# Patient Record
Sex: Male | Born: 1960
Health system: Southern US, Community
[De-identification: ages and names within clinical notes are randomized; demographics above are authoritative.]

## PROBLEM LIST (undated history)

## (undated) ENCOUNTER — Emergency Department

## (undated) ENCOUNTER — Ambulatory Visit: Admission: EM

## (undated) DIAGNOSIS — F419 Anxiety disorder, unspecified: Secondary | ICD-10-CM

## (undated) DIAGNOSIS — G709 Myoneural disorder, unspecified: Secondary | ICD-10-CM

## (undated) DIAGNOSIS — T7840XA Allergy, unspecified, initial encounter: Secondary | ICD-10-CM

## (undated) DIAGNOSIS — I10 Essential (primary) hypertension: Secondary | ICD-10-CM

## (undated) DIAGNOSIS — M069 Rheumatoid arthritis, unspecified: Secondary | ICD-10-CM

## (undated) DIAGNOSIS — F32A Depression, unspecified: Secondary | ICD-10-CM

## (undated) HISTORY — PX: TONSILLECTOMY: SUR1361

## (undated) HISTORY — DX: Anxiety disorder, unspecified: F41.9

## (undated) HISTORY — DX: Depression, unspecified: F32.A

## (undated) HISTORY — PX: ROTATOR CUFF REPAIR: SHX139

## (undated) HISTORY — DX: Myoneural disorder, unspecified: G70.9

## (undated) HISTORY — PX: COLONOSCOPY: SHX174

## (undated) HISTORY — DX: Allergy, unspecified, initial encounter: T78.40XA

---

## 2006-01-06 ENCOUNTER — Ambulatory Visit (HOSPITAL_COMMUNITY): Admission: RE | Admit: 2006-01-06 | Discharge: 2006-01-07 | Payer: Self-pay | Admitting: Orthopaedic Surgery

## 2008-08-26 HISTORY — PX: CERVICAL SPINE SURGERY: SHX589

## 2018-01-03 ENCOUNTER — Other Ambulatory Visit (INDEPENDENT_AMBULATORY_CARE_PROVIDER_SITE_OTHER): Payer: Self-pay | Admitting: Specialist

## 2018-01-03 MED ORDER — TRAMADOL HCL 50 MG PO TABS: 100.0000 mg | ORAL_TABLET | Freq: Four times a day (QID) | ORAL | 0 refills | Status: DC | PRN

## 2018-01-03 NOTE — Progress Notes (Unsigned)
A patient of Dr.Yates called on Saturday,he is having swelling in his knee and pain with weight bearing. No injury and no gout Hx. He went to [facility] Sat  5/11 and had an injection and xrays. Also started on prednisone and  The injection relief only lasted 1 hour. I tod them they can use an ACE  Wrap and ice. He is intolerant of NSAIDs. Saw Dr. Lorin Mercy for his neck and had surgery 12/2005. I called in some tramadol and he will call for an appt on Monday. They have some crutches they can use and are  Aware that they may need to go to [place] to get the xrays on a CD for [place] to view

## 2018-01-30 DIAGNOSIS — M25552 Pain in left hip: Secondary | ICD-10-CM | POA: Diagnosis not present

## 2018-01-30 DIAGNOSIS — R509 Fever, unspecified: Secondary | ICD-10-CM | POA: Diagnosis not present

## 2018-01-30 DIAGNOSIS — M25511 Pain in right shoulder: Secondary | ICD-10-CM | POA: Diagnosis not present

## 2018-02-18 ENCOUNTER — Telehealth (INDEPENDENT_AMBULATORY_CARE_PROVIDER_SITE_OTHER): Payer: Self-pay | Admitting: Specialist

## 2018-02-18 NOTE — Telephone Encounter (Signed)
Patient would like to be added to cancellation list if anything opens before 7/11. Patients # [phone]

## 2018-02-19 NOTE — Telephone Encounter (Signed)
I put him on the cancellation list 

## 2018-02-21 ENCOUNTER — Ambulatory Visit (HOSPITAL_COMMUNITY)
Admission: EM | Admit: 2018-02-21 | Discharge: 2018-02-21 | Disposition: A | Discharge status: Home / Self Care | Payer: BLUE CROSS/BLUE SHIELD | Attending: Family Medicine | Admitting: Family Medicine

## 2018-02-21 ENCOUNTER — Other Ambulatory Visit: Payer: Self-pay

## 2018-02-21 ENCOUNTER — Encounter (HOSPITAL_COMMUNITY): Payer: Self-pay | Admitting: *Deleted

## 2018-02-21 DIAGNOSIS — M25511 Pain in right shoulder: Secondary | ICD-10-CM

## 2018-02-21 DIAGNOSIS — M25542 Pain in joints of left hand: Secondary | ICD-10-CM

## 2018-02-21 DIAGNOSIS — M25541 Pain in joints of right hand: Secondary | ICD-10-CM | POA: Diagnosis not present

## 2018-02-21 DIAGNOSIS — M25462 Effusion, left knee: Secondary | ICD-10-CM

## 2018-02-21 HISTORY — DX: Essential (primary) hypertension: I10

## 2018-02-21 MED ORDER — METHYLPREDNISOLONE 4 MG PO TABS: 4.0000 mg | ORAL_TABLET | Freq: Two times a day (BID) | ORAL | 1 refills | Status: DC

## 2018-02-21 NOTE — ED Provider Notes (Signed)
[facility]   [id number] 02/21/18 Arrival Time: 1428   SUBJECTIVE:  Benjamin Fields is a 57 y.o. male who presents to the urgent care with complaint of right shoulder pain and left knee pain no injury.  Patient's initial symptoms began May 1 with right shoulder stiffness and soreness.  He subsequently developed left knee effusion and was seen by orthopedics.  He was given a intra-articular cortisone shot to the left knee.  He immediately had severe pain which lasted 2 days.  He was also given prednisone at that time and eventually experienced some mild relief.  Since that time, patient has seen his primary care doctor who prescribed diclofenac.  Patient points out that NSAIDs cause severe gastric distress for him.  He did not take the diclofenac.  Patient has developed swelling and stiffness in his right pinky and left ring finger over the last month.  He no longer can lift his right arm over shoulder.  Patient denies eye redness, sore throat, rash.  He has had a low-grade fever of 99-1/2 most evenings.  Patient has seen his primary care doctor and tried to get another appointment because of his ongoing pain but was unable to get this done.  Patient has wife would like a variety of rheumatological test done here and are looking for another therapeutic approach to his problem.   Past Medical History:  Diagnosis Date  . Hypertension    Family History  Problem Relation Age of Onset  . Heart failure Father   . Hypertension Father    Social History   Socioeconomic History  . Marital status: Single    Spouse name: Not on file  . Number of children: Not on file  . Years of education: Not on file  . Highest education level: Not on file  Occupational History  . Not on file  Social Needs  . Financial resource strain: Not on file  . Food insecurity:    Worry: Not on file    Inability: Not on file  . Transportation needs:    Medical: Not on file    Non-medical: Not on  file  Tobacco Use  . Smoking status: Never Smoker  . Smokeless tobacco: Never Used  Substance and Sexual Activity  . Alcohol use: Never    Frequency: Never  . Drug use: Never  . Sexual activity: Not on file  Lifestyle  . Physical activity:    Days per week: Not on file    Minutes per session: Not on file  . Stress: Not on file  Relationships  . Social connections:    Talks on phone: Not on file    Gets together: Not on file    Attends religious service: Not on file    Active member of club or organization: Not on file    Attends meetings of clubs or organizations: Not on file    Relationship status: Not on file  . Intimate partner violence:    Fear of current or ex partner: Not on file    Emotionally abused: Not on file    Physically abused: Not on file    Forced sexual activity: Not on file  Other Topics Concern  . Not on file  Social History Narrative  . Not on file   Current Meds  Medication Sig  . traMADol (ULTRAM) 50 MG tablet Take 2 tablets (100 mg total) by mouth every 6 (six) hours as needed for moderate pain.   Not on File  ROS: As per HPI, remainder of ROS negative.   OBJECTIVE:   Vitals:   02/21/18 1536  BP: 119/69  Pulse: (!) 102  Resp: 16  Temp: 98.6 F (37 C)  TempSrc: Oral  SpO2: 100%     General appearance: alert; no distress Eyes: PERRL; EOMI; conjunctiva normal HENT: normocephalic; atraumatic; TMs normal, canal normal, external ears normal without trauma; nasal mucosa normal; oral mucosa normal Neck: supple Lungs: clear to auscultation bilaterally Heart: regular rate and rhythm Abdomen: soft, non-tender; bowel sounds normal; no masses or organomegaly; no guarding or rebound tenderness Back: no CVA tenderness Extremities: no cyanosis or edema; symmetrical with no gross deformities; Left knee: Moderate effusion with no localized tenderness or erythema Right shoulder: Unable to abduct greater than 30 degrees, no localized pain Right  pinky shows PIP joint swelling which is mild and incomplete flexion Left ring finger shows PIP joint swelling and incomplete flexion. Skin: warm and dry Neurologic: normal gait; grossly normal Psychological: alert and cooperative; normal mood and affect      Labs:  No results found for this or any previous visit.  Labs Reviewed - No data to display  No results found.     ASSESSMENT & PLAN:  1. Pain in joint of right shoulder   2. Effusion of left knee   3. Bilateral finger arthralgia   It is unclear to me what the patient expects [place] to do here in urgent care center with an ongoing rheumatological problem.  I explained that they need to see a rheumatologist and get the referral done through the primary care doctor.  Patient does have a appointment in July with an orthopedic doctor.  I have indicated that they need to see a rheumatologist and I have indicated in the epic chart that they could see Dr. Estanislado Pandy, since she is affiliated with the orthopedic practice from which he received a knee injection.  Meds ordered this encounter  Medications  . methylPREDNISolone (MEDROL) 4 MG tablet    Sig: Take 1 tablet (4 mg total) by mouth 2 (two) times daily.    Dispense:  10 tablet    Refill:  1    Reviewed expectations re: course of current medical issues. Questions answered. Outlined signs and symptoms indicating need for more acute intervention. Patient verbalized understanding. After Visit Summary given.       Robyn Haber, MD 02/21/18 1651

## 2018-02-21 NOTE — ED Triage Notes (Signed)
C/o right shoulder pain and left knee pain no injury

## 2018-02-21 NOTE — Discharge Instructions (Signed)
Clearly have an arthritis problem that needs to be better characterized.  You need to call the rheumatologist on Monday and schedule an appointment.

## 2018-03-05 ENCOUNTER — Encounter (INDEPENDENT_AMBULATORY_CARE_PROVIDER_SITE_OTHER): Payer: Self-pay | Admitting: Specialist

## 2018-03-05 ENCOUNTER — Ambulatory Visit (INDEPENDENT_AMBULATORY_CARE_PROVIDER_SITE_OTHER): Payer: BLUE CROSS/BLUE SHIELD | Admitting: Specialist

## 2018-03-05 ENCOUNTER — Ambulatory Visit (INDEPENDENT_AMBULATORY_CARE_PROVIDER_SITE_OTHER): Payer: BLUE CROSS/BLUE SHIELD

## 2018-03-05 VITALS — BP 128/79 | HR 92 | Temp 98.3°F | Ht 70.0 in | Wt 180.0 lb

## 2018-03-05 DIAGNOSIS — M25511 Pain in right shoulder: Secondary | ICD-10-CM

## 2018-03-05 DIAGNOSIS — M25552 Pain in left hip: Secondary | ICD-10-CM | POA: Diagnosis not present

## 2018-03-05 DIAGNOSIS — M25562 Pain in left knee: Secondary | ICD-10-CM | POA: Diagnosis not present

## 2018-03-05 MED ORDER — METHYLPREDNISOLONE 4 MG PO TABS: ORAL_TABLET | ORAL | 1 refills | Status: DC

## 2018-03-05 MED ORDER — TRAMADOL HCL 50 MG PO TABS: 100.0000 mg | ORAL_TABLET | Freq: Four times a day (QID) | ORAL | 0 refills | Status: AC | PRN

## 2018-03-05 NOTE — Patient Instructions (Addendum)
Plan: Avoid overhead lifting and overhead use of the arms. Do not lift greater than 10 lbs. Tylenol ES one every 6-8 hours for pain and inflamation. Call if you are having right knee pain and need to consider a cortisone injection into the right knee.  Home exercise program. ROM to prevent stiffness.  Knee is suffering from osteoarthritis, only real proven treatments are Weight loss, arthritis medications and exercise. Well padded shoes help. Ice the knee 2-3 times a day 15-20 mins at a time. MRI of the right shoulder and left knee ordered to assess soft tissue density about the right humerus and left knee presistent pain and locking. Use a cane in the left hand.

## 2018-03-05 NOTE — Progress Notes (Addendum)
Office Visit Note   Patient: Benjamin Fields           Date of Birth: 07/26/1961           MRN: [id number] Visit Date: 03/05/2018              Requested by: No referring provider defined for this encounter. PCP: Patient, No Pcp Per   Assessment & Plan: Visit Diagnoses:  1. Acute pain of right shoulder   2. Acute pain of left knee   3. Pain in left hip     Plan: Avoid overhead lifting and overhead use of the arms. Do not lift greater than 10 lbs. Tylenol ES one every 6-8 hours for pain and inflamation. Call if you are having right knee pain and need to consider a cortisone injection into the right knee.  Home exercise program.  Knee is suffering from osteoarthritis, only real proven treatments are Weight loss, arthritis medications and exercise. Well padded shoes help. Ice the knee 2-3 times a day 15-20 mins at a time. MRI of the right shoulder and left knee ordered to assess soft tissue density about the right humerus and left knee presistent pain and locking.   Follow-Up Instructions: Return in about 1 week (around 03/12/2018).   Orders:  Orders Placed This Encounter  Procedures  . XR Shoulder Right   No orders of the defined types were placed in this encounter.     Procedures: No procedures performed   Clinical Data: No additional findings.   Subjective: Chief Complaint  Patient presents with  . Right Shoulder - Pain  . Left Hip - Pain    57 year old male with history of previous 3 level fusion of the neck, he has been experiencing pain into the right knee and right shoulder. Notices a Decrease in the affects of an injection done at the [facility] 01/03/2018. He was given a medrol dose pak and he had excellent response to the dose pak with nearly relief of the pain in the knee and the right shoulder. At the day the medication was discontinued the pain recurred. He hurts, has difficulty working or sleeping, notes some fever at night and night sweats.  Reports pain in the right shoulder with stiffness. No history of  Renal disease, no heart of lung disease. 18 years ago he had a pneumonia, was hospitalized for several days.    Review of Systems   Objective: Vital Signs: BP 128/79   Pulse 92   Temp 98.3 F (36.8 C)   Ht 5\' 10"  (1.778 m)   Wt 180 lb (81.6 kg)   BMI 25.83 kg/m   Physical Exam  Constitutional: He is oriented to person, place, and time. He appears well-developed and well-nourished.  HENT:  Head: Normocephalic and atraumatic.  Eyes: Pupils are equal, round, and reactive to light. EOM are normal.  Neck: Normal range of motion. Neck supple.  Pulmonary/Chest: Effort normal and breath sounds normal.  Abdominal: Soft. Bowel sounds are normal.  Neurological: He is alert and oriented to person, place, and time.  Skin: Skin is warm and dry.  Psychiatric: He has a normal mood and affect. His behavior is normal. Judgment and thought content normal.    Right Knee Exam  Right knee exam is normal.  Muscle Strength  The patient has normal right knee strength.   Left Knee Exam   Tenderness  The patient is experiencing tenderness in the patella and patellar tendon.  Range  of Motion  Extension:  -25 abnormal  Flexion: 120   Tests  McMurray:  Medial - negative Lateral - negative Varus: negative  Lachman:  Anterior - negative    Posterior - negative Drawer:  Anterior - negative     Posterior - negative   Left Hip Exam   Range of Motion  Flexion:  80 abnormal  External rotation: 30  Internal rotation: 20    Back Exam   Tenderness  The patient is experiencing tenderness in the lumbar.  Muscle Strength  Right Quadriceps:  5/5  Left Quadriceps:  5/5  Right Hamstrings:  5/5  Left Hamstrings:  5/5   Tests  Straight leg raise left: positive  Reflexes  Patellar:  2/4 normal Achilles:  2/4 normal Babinski's sign: normal   Other  Toe walk: normal Heel walk: normal Sensation: normal Gait: normal    Erythema: no back redness Scars: absent   Right Shoulder Exam   Tenderness  The patient is experiencing tenderness in the acromion.  Range of Motion  Active abduction:  120 abnormal  Passive abduction: 150  Extension: 40  External rotation: 80  Forward flexion: 150  Internal rotation 0 degrees:  T8 abnormal  Internal rotation 90 degrees: 60   Muscle Strength  Abduction: 4/5  Internal rotation: 5/5  External rotation: 4/5  Supraspinatus: 4/5  Subscapularis: 5/5  Biceps: 5/5   Tests  Apprehension: positive Hawkins test: negative Cross arm: negative Impingement: positive Drop arm: positive Sulcus: absent  Other  Erythema: absent Sensation: normal Pulse: present  Comments:  Painful generalized ROM right shoulder    Left Shoulder Exam  Left shoulder exam is normal.      Specialty Comments:  No specialty comments available.  Imaging: No results found.   PMFS History: There are no active problems to display for this patient.  Past Medical History:  Diagnosis Date  . Hypertension     Family History  Problem Relation Age of Onset  . Heart failure Father   . Hypertension Father     Past Surgical History:  Procedure Laterality Date  . CERVICAL SPINE SURGERY     Social History   Occupational History  . Not on file  Tobacco Use  . Smoking status: Never Smoker  . Smokeless tobacco: Never Used  Substance and Sexual Activity  . Alcohol use: Never    Frequency: Never  . Drug use: Never  . Sexual activity: Not on file

## 2018-03-09 ENCOUNTER — Telehealth (INDEPENDENT_AMBULATORY_CARE_PROVIDER_SITE_OTHER): Payer: Self-pay | Admitting: Specialist

## 2018-03-09 LAB — RHEUMATOID FACTOR: Rhuematoid fact SerPl-aCnc: <14 IU/mL (ref <14)

## 2018-03-09 LAB — COMPREHENSIVE METABOLIC PANEL
AG RATIO: 1.4 (calc) (ref 1.0–2.5)
ALBUMIN MSPROF: 3.7 g/dL (ref 3.6–5.1)
ALKALINE PHOSPHATASE (APISO): 90 U/L (ref 40–115)
ALT: 15 U/L (ref 9–46)
AST: 12 U/L (ref 10–35)
BUN: 19 mg/dL (ref 7–25)
CHLORIDE: 102 mmol/L (ref 98–110)
CO2: 26 mmol/L (ref 20–32)
Calcium: 9.1 mg/dL (ref 8.6–10.3)
Creat: 1.16 mg/dL (ref 0.70–1.33)
GLOBULIN: 2.6 g/dL (ref 1.9–3.7)
Glucose, Bld: 95 mg/dL (ref 65–99)
POTASSIUM: 4.7 mmol/L (ref 3.5–5.3)
SODIUM: 138 mmol/L (ref 135–146)
TOTAL PROTEIN: 6.3 g/dL (ref 6.1–8.1)
Total Bilirubin: 0.5 mg/dL (ref 0.2–1.2)

## 2018-03-09 LAB — ANA: Anti Nuclear Antibody(ANA): NEGATIVE

## 2018-03-09 LAB — C-REACTIVE PROTEIN: CRP: 96.4 mg/L — ABNORMAL HIGH (ref <8.0)

## 2018-03-09 LAB — TIQ-NTM

## 2018-03-09 LAB — SEDIMENTATION RATE: SED RATE: 53 mm/h — AB (ref 0–20)

## 2018-03-09 NOTE — Telephone Encounter (Signed)
Patients wife called and is requesting a temporary handicap placard due to patients knee pain/hacing to walk with a cane. Also, she is wondering about the results of blood work done on Thursday. Please call # [phone]

## 2018-03-10 ENCOUNTER — Encounter (INDEPENDENT_AMBULATORY_CARE_PROVIDER_SITE_OTHER): Payer: Self-pay | Admitting: Specialist

## 2018-03-10 ENCOUNTER — Telehealth (INDEPENDENT_AMBULATORY_CARE_PROVIDER_SITE_OTHER): Payer: Self-pay | Admitting: Radiology

## 2018-03-10 NOTE — Telephone Encounter (Signed)
Sent message via My Chart to advise him this is ready for pick up.

## 2018-03-10 NOTE — Telephone Encounter (Addendum)
Patient is calling wanting to know the results of his blood work.

## 2018-03-11 ENCOUNTER — Encounter (INDEPENDENT_AMBULATORY_CARE_PROVIDER_SITE_OTHER): Payer: Self-pay | Admitting: Specialist

## 2018-03-13 ENCOUNTER — Other Ambulatory Visit (INDEPENDENT_AMBULATORY_CARE_PROVIDER_SITE_OTHER): Payer: Self-pay | Admitting: Specialist

## 2018-03-13 DIAGNOSIS — M79601 Pain in right arm: Secondary | ICD-10-CM

## 2018-03-14 ENCOUNTER — Other Ambulatory Visit: Payer: BLUE CROSS/BLUE SHIELD

## 2018-03-14 ENCOUNTER — Ambulatory Visit
Admission: RE | Admit: 2018-03-14 | Discharge: 2018-03-14 | Disposition: A | Discharge status: Home / Self Care | Payer: BLUE CROSS/BLUE SHIELD | Source: Ambulatory Visit | Attending: Specialist | Admitting: Specialist

## 2018-03-14 ENCOUNTER — Inpatient Hospital Stay: Admission: RE | Admit: 2018-03-14 | Payer: BLUE CROSS/BLUE SHIELD | Source: Ambulatory Visit

## 2018-03-14 DIAGNOSIS — M25562 Pain in left knee: Secondary | ICD-10-CM

## 2018-03-14 DIAGNOSIS — M25411 Effusion, right shoulder: Secondary | ICD-10-CM | POA: Diagnosis not present

## 2018-03-14 DIAGNOSIS — M79601 Pain in right arm: Secondary | ICD-10-CM

## 2018-03-14 DIAGNOSIS — M1712 Unilateral primary osteoarthritis, left knee: Secondary | ICD-10-CM | POA: Diagnosis not present

## 2018-03-15 ENCOUNTER — Encounter (INDEPENDENT_AMBULATORY_CARE_PROVIDER_SITE_OTHER): Payer: Self-pay | Admitting: Specialist

## 2018-03-16 ENCOUNTER — Other Ambulatory Visit (INDEPENDENT_AMBULATORY_CARE_PROVIDER_SITE_OTHER): Payer: Self-pay | Admitting: Specialist

## 2018-03-16 DIAGNOSIS — M6599 Unspecified synovitis and tenosynovitis, multiple sites: Secondary | ICD-10-CM

## 2018-03-16 DIAGNOSIS — M659 Synovitis and tenosynovitis, unspecified: Secondary | ICD-10-CM

## 2018-03-16 DIAGNOSIS — M25562 Pain in left knee: Secondary | ICD-10-CM

## 2018-03-16 DIAGNOSIS — M25512 Pain in left shoulder: Secondary | ICD-10-CM

## 2018-03-16 DIAGNOSIS — M5030 Other cervical disc degeneration, unspecified cervical region: Secondary | ICD-10-CM

## 2018-03-16 HISTORY — DX: Other cervical disc degeneration, unspecified cervical region: M50.30

## 2018-03-16 NOTE — Telephone Encounter (Signed)
Called to [facility] 

## 2018-03-16 NOTE — Progress Notes (Signed)
Office Visit Note  Patient: Benjamin Fields             Date of Birth: [birth year]           MRN: [id number]             PCP: Lennie Odor, PA-C Referring: Jessy Oto, MD Visit Date: 03/17/2018 Occupation: Director of finance  Subjective:  Pain in multiple joints   History of Present Illness: Benjamin Fields is a 57 y.o. male seen in consultation per request of Dr. Louanne Skye.  According to patient his symptoms started in March 2019 with right shoulder joint pain and stiffness.  He states gradually the shoulder joint pain got worse to the point he was having difficulty lifting his arm.  About 2 months later he noticed a bruise on the inner side of his left knee.  He says the bruise last for approximately a week.  When the bruise resolved he developed pain and swelling in his left knee joint and also discomfort in his left hip.  He states he has had tick bites and had Lyme test done which was negative.  He has been running low-grade fever and chills.  He recalls in May on 2019 he went to urgent care at [facility] where he had cortisone injection to the left knee joint and also was given a prednisone taper.  He states he did really well for the next 3 to 4 weeks and was basically asymptomatic after that the symptoms recurred.  On the first week of July he was seen at [facility] urgent care where was he was in a lot of discomfort and was given another prednisone taper for 5 days which helped.  He came to see Dr. Louanne Skye due to ongoing joint pain and discomfort and he started him on Medrol 4 mg a day on March 05, 2018.  He has been also taking tramadol for pain management.  He also had MRI of his right shoulder joint and left knee joint which showed moderate effusion.  He has noticed some swelling in his left knee joint off and on.  There is no history of swelling in any other joints.  Activities of Daily Living:  Patient reports morning stiffness for 24 hours.   Patient Reports nocturnal pain.    Difficulty dressing/grooming: Reports Difficulty climbing stairs: Reports Difficulty getting out of chair: Reports Difficulty using hands for taps, buttons, cutlery, and/or writing: Denies  Review of Systems  Constitutional: Positive for activity change and fatigue. Negative for night sweats.  HENT: Positive for nose dryness. Negative for mouth sores and mouth dryness.   Eyes: Negative for redness and dryness.  Respiratory: Negative for cough, shortness of breath and difficulty breathing.   Cardiovascular: Negative for chest pain, palpitations, hypertension, irregular heartbeat and swelling in legs/feet.  Gastrointestinal: Negative for constipation and diarrhea.  Endocrine: Negative for increased urination.  Genitourinary: Negative for difficulty urinating.  Musculoskeletal: Positive for arthralgias, joint pain, joint swelling and morning stiffness. Negative for myalgias, muscle weakness, muscle tenderness and myalgias.  Skin: Negative for color change, rash, hair loss, nodules/bumps, skin tightness, ulcers and sensitivity to sunlight.  Allergic/Immunologic: Negative for susceptible to infections.  Neurological: Negative for dizziness, fainting, memory loss, night sweats and weakness.  Hematological: Positive for bruising/bleeding tendency. Negative for swollen glands.  Psychiatric/Behavioral: Negative for depressed mood and sleep disturbance. The patient is not nervous/anxious.     PMFS History:  Patient Active Problem List   Diagnosis Date Noted  .  Essential hypertension 03/17/2018  . DDD (degenerative disc disease), cervical 03/16/2018    Past Medical History:  Diagnosis Date  . Hypertension     Family History  Problem Relation Age of Onset  . Parkinson's disease Mother   . Heart failure Father   . Hypertension Father    Past Surgical History:  Procedure Laterality Date  . CERVICAL SPINE SURGERY    . TONSILLECTOMY     Social History   Social History Narrative  . Not  on file    Objective: Vital Signs: BP 113/71 (BP Location: Left Arm, Patient Position: Sitting, Cuff Size: Normal)   Pulse 77   Resp 16   Ht 5' 10"  (1.778 m)   Wt 184 lb (83.5 kg)   BMI 26.40 kg/m    Physical Exam  Constitutional: He is oriented to person, place, and time. He appears well-developed and well-nourished.  HENT:  Head: Normocephalic and atraumatic.  Eyes: Pupils are equal, round, and reactive to light. Conjunctivae and EOM are normal.  Neck: Normal range of motion. Neck supple.  Cardiovascular: Normal rate, regular rhythm and normal heart sounds.  Pulmonary/Chest: Effort normal and breath sounds normal.  Abdominal: Soft. Bowel sounds are normal.  Neurological: He is alert and oriented to person, place, and time.  Skin: Skin is warm and dry. Capillary refill takes less than 2 seconds.  Psychiatric: He has a normal mood and affect. His behavior is normal.  Nursing note and vitals reviewed.    Musculoskeletal Exam: C-spine limited range of motion.  He has no thoracic kyphosis.  Lumbar spine good range of motion.  Forward flexion was limited.  No SI joint tenderness was noted.  Right shoulder joint had painful range of motion.  He had large effusion in his right shoulder.  Left shoulder joint bilateral elbow joints and wrist joints are in good range of motion.  He has synovitis in some of the PIPs as described below.  He had painful range of motion of his left hip joint.  He has warmth and effusion in his left knee joint.  Ankle joints MTPs PIPs were in good range of motion.  There was no evidence of Achilles tendinitis or plantar fasciitis.  CDAI Exam: CDAI Homunculus Exam:   Tenderness:  RUE: glenohumeral Right hand: 5th PIP Left hand: 4th PIP LLE: acetabulofemoral and tibiofemoral  Swelling:  RUE: glenohumeral Right hand: 5th PIP Left hand: 4th PIP LLE: tibiofemoral  Joint Counts:  CDAI Tender Joint count: 4 CDAI Swollen Joint count: 4  Global Assessments:    Patient Global Assessment: 8 Provider Global Assessment: 8  CDAI Calculated Score: 24   Investigation: Findings:       Imaging: Mr Humerus Right Wo Contrast  Result Date: 03/15/2018 CLINICAL DATA:  Right shoulder pain. Increased pain when lifting arm for 3-4 months. EXAM: MRI OF THE RIGHT HUMERUS WITHOUT CONTRAST TECHNIQUE: Multiplanar, multisequence MR imaging of the shoulder was performed. No intravenous contrast was administered. COMPARISON:  Radiograph 03/05/2018 FINDINGS: Rotator cuff: Mild infraspinatus and subscapularis tendinopathy/tendinosis. Moderate supraspinatus tendinopathy with small interstitial tears. There is also a shallow articular surface tear anteriorly measuring 6 x 6 mm. No full-thickness retracted tear. Muscles:  Normal Biceps long head: Intact. Mild tendinopathy involving the intra-articular portion. Acromioclavicular Joint: Mild degenerative changes type 1 acromion. Mild lateral downsloping but no undersurface spurring. Glenohumeral Joint: Mild degenerative changes and small to moderate-sized joint effusion. There is also moderate synovitis and moderate rotator interval synovitis. Labrum:  No definite labral tears.  Bones:  No acute bony findings. Other: Large amount of fluid in the subacromial/subdeltoid bursa and also in the subcoracoid bursa and surrounding the biceps tendon. Numerous septations are noted along with synovitis. IMPRESSION: 1. Moderate supraspinatus tendinopathy/tendinosis with small interstitial tears and a shallow articular surface tear. No full-thickness retracted tear. 2. Glenohumeral joint effusion and synovitis. There is also a large amount of fluid in the subacromial/subdeltoid bursa and subcoracoid bursa with septations and possible synovitis. Patient also had a large knee joint effusion and possibility of an inflammatory arthritis is possible. 3. No significant findings for bony impingement. 4. Intact long head biceps tendon and glenoid labrum.  Tendinopathy involving the intra-articular portion of the biceps tendon. Electronically Signed   By: Marijo Sanes M.D.   On: 03/15/2018 11:27   Mr Knee Left W/o Contrast  Result Date: 03/15/2018 CLINICAL DATA:  Knee pain and swelling. EXAM: MRI OF THE LEFT KNEE WITHOUT CONTRAST TECHNIQUE: Multiplanar, multisequence MR imaging of the knee was performed. No intravenous contrast was administered. COMPARISON:  None. FINDINGS: MENISCI Medial meniscus: Intrasubstance degenerative type signal changes involving the posterior horn but no discrete tear. Lateral meniscus:  Intact LIGAMENTS Cruciates:  Intact Collaterals:  Intact CARTILAGE Patellofemoral: Mild degenerative chondrosis. There is also slight lateral tilt of the patella and a very short steep medial facet. Narrowing of the lateral patellofemoral joint. Medial:  Mild degenerative chondrosis. Lateral:  Mild degenerative chondrosis. Joint: Large joint effusion and mild synovitis. Superior and medial patellar plica noted. Popliteal Fossa:  Small leaking Baker's cyst. Extensor Mechanism: The patella retinacular structures are intact and the quadriceps and patellar tendons are intact. Mild lateral tilt of the patella. The TT-TG distance is slightly widened at 21 mm. There is also edema like signal abnormality in the upper lateral aspect of Hoffa's fat which may suggest lateral patellar compression syndrome. Bones: No acute bony findings. No bone contusion, marrow edema or osteochondral lesion. Other: Nonspecific edema like signal abnormality in the vastus medialis and lateralis muscles. This could represent muscle strain or overuse syndrome. Nonspecific myositis could be related to collagen vascular disease, polymyositis or possible drug reaction (statins). The hamstring muscles appear normal. IMPRESSION: 1. Patellofemoral joint degenerative changes and findings suggest lateral patellar compression syndrome. Slight lateral tilt of the patella and widened TT-TG  distance. 2. Intact ligamentous structures and no acute bony findings. 3. Degenerative changes involving the posterior horn of the medial meniscus but no discrete tear. 4. Large joint effusion and mild synovitis. Small leaking Baker's cyst. 5. Nonspecific edema like signal abnormality in the vastus medialis and lateralis muscles as discussed above. Electronically Signed   By: Marijo Sanes M.D.   On: 03/15/2018 11:18   Xr Shoulder Right  Result Date: 03/13/2018 Right shoulder radiographs AP, axillary lateral and outlet view demonstrate no significant G-H joint narrowing, soft tissue density consistent with mass effect right mid and upper posterior humerus, minimal sclerosis greater tuberosity. AC joint with mild enlargement of the distal clavicle but joint line is not narrowed. Subacromial space is well Maintained. Findings of soft tissue swelling in the right upper posterior humerus are concerning for soft tissue mass, inflamatory granulomatous Vs malignancy, findings discussed with the radiologist on call for [facility] Radiology Dr. Keturah Barre.    Recent Labs: Lab Results  Component Value Date   NA 138 03/05/2018   K 4.7 03/05/2018   CL 102 03/05/2018   CO2 26 03/05/2018   GLUCOSE 95 03/05/2018   BUN 19 03/05/2018   CREATININE 1.16 03/05/2018  BILITOT 0.5 03/05/2018   AST 12 03/05/2018   ALT 15 03/05/2018   PROT 6.3 03/05/2018   CALCIUM 9.1 03/05/2018  03/05/2018 ESR 53, CRP 96.4, RF negative ,anti-CCP negative  Speciality Comments: No specialty comments available.  Procedures:  Large Joint Inj: L knee on 03/17/2018 11:26 AM Indications: pain Details: 27 G 1.5 in needle, medial approach  Arthrogram: No  Medications: 3 mL lidocaine (PF) 1 %; 60 mg triamcinolone acetonide 40 MG/ML Aspirate: 23 mL Outcome: tolerated well, no immediate complications Procedure, treatment alternatives, risks and benefits explained, specific risks discussed. Consent was given by the patient. Immediately prior  to procedure a time out was called to verify the correct patient, procedure, equipment, support staff and site/side marked as required. Patient was prepped and draped in the usual sterile fashion.     Allergies: Patient has no allergy information on record.   Assessment / Plan:     Visit Diagnoses: Inflammatory arthritis -patient has inflammatory arthritis in multiple joints involving his right shoulder joint with effusion, inflammatory arthritis in bilateral hands, left hip joint and left knee joint.  His sedimentation rate is elevated.  He has a prescription of Medrol 4 mg p.o. daily by Dr. Louanne Skye which she has been taking for a week now and he will continue for another week.  He states he is some controlling his symptoms quite well with the Medrol and Ultram combination currently.  Polyarthralgia -I will obtain following labs today.  Plan: ANA, Angiotensin converting enzyme, HLA-B27 antigen, B. burgdorfi antibodies, Rocky mtn spotted fvr abs pnl(IgG+IgM), Uric acid,14-3-3 eta Protein, Parvovirus B19 antibody, IgG and IgM  Effusion of right shoulder-he has palpable effusion he also had effusion on the MRI.  I reviewed the x-ray which was unremarkable.  Acute pain of right shoulder - Effusion noted on MRI  Pain in both hands-he has synovitis in his PIPs as described above.  Effusion of left knee -patient has limited extension, positive Baker's cyst and effusion in his knee joint.  After different treatment options were discussed the left knee joint was aspirated for diagnostic and therapeutic purposes.  The synovial fluid was sent for evaluation.  Plan: Synovial cell count + diff, w/ crystals, Anaerobic and Aerobic Culture  Chronic pain of both knees - MRI left knee showed large effusion  DDD (degenerative disc disease), cervical - s/p fusion  High risk medication use - Plan: HIV antibody, QuantiFERON-TB Gold Plus, Serum protein electrophoresis with reflex, IgG, IgA, IgM, Hepatitis B core  antibody, IgM, Hepatitis B surface antigen, Hepatitis C antibody, Glucose 6 phosphate dehydrogenase  Elevated sed rate -   Essential hypertension-his blood pressure is well controlled.  Other fatigue - Plan: CBC with Differential/Platelet, Urinalysis, Routine w reflex microscopic, CK, TSH   Orders: Orders Placed This Encounter  Procedures  . Anaerobic and Aerobic Culture  . CBC with Differential/Platelet  . Urinalysis, Routine w reflex microscopic  . CK  . TSH  . ANA  . Angiotensin converting enzyme  . HLA-B27 antigen  . B. burgdorfi antibodies  . Rocky mtn spotted fvr abs pnl(IgG+IgM)  . HIV antibody  . QuantiFERON-TB Gold Plus  . Serum protein electrophoresis with reflex  . IgG, IgA, IgM  . Hepatitis B core antibody, IgM  . Hepatitis B surface antigen  . Hepatitis C antibody  . Glucose 6 phosphate dehydrogenase  . Uric acid  . 14-3-3 eta Protein  . Parvovirus B19 antibody, IgG and IgM  . Synovial cell count + diff, w/ crystals  No orders of the defined types were placed in this encounter.   Face-to-face time spent with patient was 60 minutes. Greater than 50% of time was spent in counseling and coordination of care.  Follow-Up Instructions: Return for inflammatory arthritis.   Bo Merino, MD  Note - This record has been created using Editor, commissioning.  Chart creation errors have been sought, but may not always  have been located. Such creation errors do not reflect on  the standard of medical care.

## 2018-03-16 NOTE — Telephone Encounter (Signed)
Tramadol refill request 

## 2018-03-17 ENCOUNTER — Encounter (INDEPENDENT_AMBULATORY_CARE_PROVIDER_SITE_OTHER): Payer: Self-pay | Admitting: Specialist

## 2018-03-17 ENCOUNTER — Encounter: Payer: Self-pay | Admitting: Rheumatology

## 2018-03-17 ENCOUNTER — Ambulatory Visit: Payer: BLUE CROSS/BLUE SHIELD | Admitting: Rheumatology

## 2018-03-17 VITALS — BP 113/71 | HR 77 | Resp 16 | Ht 70.0 in | Wt 184.0 lb

## 2018-03-17 DIAGNOSIS — I10 Essential (primary) hypertension: Secondary | ICD-10-CM

## 2018-03-17 DIAGNOSIS — M25511 Pain in right shoulder: Secondary | ICD-10-CM

## 2018-03-17 DIAGNOSIS — M1990 Unspecified osteoarthritis, unspecified site: Secondary | ICD-10-CM | POA: Diagnosis not present

## 2018-03-17 DIAGNOSIS — M25462 Effusion, left knee: Secondary | ICD-10-CM | POA: Diagnosis not present

## 2018-03-17 DIAGNOSIS — M5030 Other cervical disc degeneration, unspecified cervical region: Secondary | ICD-10-CM

## 2018-03-17 DIAGNOSIS — M79641 Pain in right hand: Secondary | ICD-10-CM | POA: Diagnosis not present

## 2018-03-17 DIAGNOSIS — M25562 Pain in left knee: Secondary | ICD-10-CM

## 2018-03-17 DIAGNOSIS — R700 Elevated erythrocyte sedimentation rate: Secondary | ICD-10-CM | POA: Diagnosis not present

## 2018-03-17 DIAGNOSIS — R5383 Other fatigue: Secondary | ICD-10-CM

## 2018-03-17 DIAGNOSIS — M25561 Pain in right knee: Secondary | ICD-10-CM

## 2018-03-17 DIAGNOSIS — Z79899 Other long term (current) drug therapy: Secondary | ICD-10-CM

## 2018-03-17 DIAGNOSIS — M79642 Pain in left hand: Secondary | ICD-10-CM

## 2018-03-17 DIAGNOSIS — M1380 Other specified arthritis, unspecified site: Secondary | ICD-10-CM

## 2018-03-17 DIAGNOSIS — M2550 Pain in unspecified joint: Secondary | ICD-10-CM | POA: Diagnosis not present

## 2018-03-17 DIAGNOSIS — G8929 Other chronic pain: Secondary | ICD-10-CM

## 2018-03-17 DIAGNOSIS — M25411 Effusion, right shoulder: Secondary | ICD-10-CM

## 2018-03-17 MED ORDER — TRIAMCINOLONE ACETONIDE 40 MG/ML IJ SUSP
60.0000 mg | INTRAMUSCULAR | Status: AC | PRN
  Administered 2018-03-17: 60 mg via INTRA_ARTICULAR

## 2018-03-17 MED ORDER — LIDOCAINE HCL (PF) 1 % IJ SOLN
3.0000 mL | INTRAMUSCULAR | Status: AC | PRN
  Administered 2018-03-17: 3 mL

## 2018-03-17 NOTE — Telephone Encounter (Signed)
Called to [facility] 

## 2018-03-17 NOTE — Patient Instructions (Signed)
Please consider getting pneumococcal vaccine and Shingrix vaccine from your PCP.  You cannot have any live vaccines.

## 2018-03-17 NOTE — Telephone Encounter (Signed)
Tramadol refill

## 2018-03-20 ENCOUNTER — Other Ambulatory Visit (INDEPENDENT_AMBULATORY_CARE_PROVIDER_SITE_OTHER): Payer: Self-pay | Admitting: Specialist

## 2018-03-20 LAB — CBC WITH DIFFERENTIAL/PLATELET
BASOS ABS: 93 {cells}/uL (ref 0–200)
BASOS PCT: 0.9 %
EOS ABS: 82 {cells}/uL (ref 15–500)
Eosinophils Relative: 0.8 %
HEMATOCRIT: 43 % (ref 38.5–50.0)
HEMOGLOBIN: 14.9 g/dL (ref 13.2–17.1)
LYMPHS ABS: 1360 {cells}/uL (ref 850–3900)
MCH: 30.2 pg (ref 27.0–33.0)
MCHC: 34.7 g/dL (ref 32.0–36.0)
MCV: 87 fL (ref 80.0–100.0)
MPV: 9.4 fL (ref 7.5–12.5)
Monocytes Relative: 5.4 %
NEUTROS ABS: 8209 {cells}/uL — AB (ref 1500–7800)
Neutrophils Relative %: 79.7 %
Platelets: 359 10*3/uL (ref 140–400)
RBC: 4.94 10*6/uL (ref 4.20–5.80)
RDW: 13 % (ref 11.0–15.0)
Total Lymphocyte: 13.2 %
WBC: 10.3 10*3/uL (ref 3.8–10.8)
WBCMIX: 556 {cells}/uL (ref 200–950)

## 2018-03-20 LAB — IGG, IGA, IGM
IgG (Immunoglobin G), Serum: 548 mg/dL — ABNORMAL LOW (ref 600–1640)
IgM, Serum: 78 mg/dL (ref 50–300)
Immunoglobulin A: 93 mg/dL (ref 47–310)

## 2018-03-20 LAB — QUANTIFERON-TB GOLD PLUS
Mitogen-NIL: 7.36 IU/mL
NIL: 0.03 [IU]/mL
QUANTIFERON-TB GOLD PLUS: NEGATIVE
TB1-NIL: 0 IU/mL
TB2-NIL: 0 IU/mL

## 2018-03-20 LAB — URINALYSIS, ROUTINE W REFLEX MICROSCOPIC
BILIRUBIN URINE: NEGATIVE
GLUCOSE, UA: NEGATIVE
Hgb urine dipstick: NEGATIVE
Ketones, ur: NEGATIVE
Leukocytes, UA: NEGATIVE
Nitrite: NEGATIVE
Protein, ur: NEGATIVE
SPECIFIC GRAVITY, URINE: 1.023 (ref 1.001–1.03)
pH: 7.5 (ref 5.0–8.0)

## 2018-03-20 LAB — PROTEIN ELECTROPHORESIS, SERUM, WITH REFLEX
ALPHA 2: 1 g/dL — AB (ref 0.5–0.9)
Albumin ELP: 3.9 g/dL (ref 3.8–4.8)
Alpha 1: 0.4 g/dL — ABNORMAL HIGH (ref 0.2–0.3)
BETA 2: 0.3 g/dL (ref 0.2–0.5)
BETA GLOBULIN: 0.4 g/dL (ref 0.4–0.6)
Gamma Globulin: 0.5 g/dL — ABNORMAL LOW (ref 0.8–1.7)
TOTAL PROTEIN: 6.6 g/dL (ref 6.1–8.1)

## 2018-03-20 LAB — ROCKY MTN SPOTTED FVR ABS PNL(IGG+IGM)
RMSF IgG: NOT DETECTED
RMSF IgM: NOT DETECTED

## 2018-03-20 LAB — TSH: TSH: 1.03 mIU/L (ref 0.40–4.50)

## 2018-03-20 LAB — 14-3-3 ETA PROTEIN: 14-3-3 eta Protein: <0.2 ng/mL (ref <0.2)

## 2018-03-20 LAB — HLA-B27 ANTIGEN: HLA-B27 ANTIGEN: NEGATIVE

## 2018-03-20 LAB — URIC ACID: Uric Acid, Serum: 6.2 mg/dL (ref 4.0–8.0)

## 2018-03-20 LAB — HEPATITIS C ANTIBODY
Hepatitis C Ab: NONREACTIVE
SIGNAL TO CUT-OFF: 0.01 (ref <1.00)

## 2018-03-20 LAB — GLUCOSE 6 PHOSPHATE DEHYDROGENASE: G-6PDH: 13.9 U/g{Hb} (ref 7.0–20.5)

## 2018-03-20 LAB — ANGIOTENSIN CONVERTING ENZYME: Angiotensin-Converting Enzyme: 40 U/L (ref 9–67)

## 2018-03-20 LAB — ANA: Anti Nuclear Antibody(ANA): NEGATIVE

## 2018-03-20 LAB — IFE INTERPRETATION: IMMUNOFIX ELECTR INT: NOT DETECTED

## 2018-03-20 LAB — B. BURGDORFI ANTIBODIES

## 2018-03-20 LAB — CK: CK TOTAL: 51 U/L (ref 44–196)

## 2018-03-20 LAB — PARVOVIRUS B19 ANTIBODY, IGG AND IGM
PARVOVIRUS B19 IGM: 0.1 (ref <0.9)
Parvovirus B19 IgG: 3.9 — ABNORMAL HIGH (ref <0.9)

## 2018-03-20 LAB — HEPATITIS B CORE ANTIBODY, IGM: HEP B C IGM: NONREACTIVE

## 2018-03-20 LAB — HEPATITIS B SURFACE ANTIGEN: Hepatitis B Surface Ag: NONREACTIVE

## 2018-03-20 LAB — HIV ANTIBODY (ROUTINE TESTING W REFLEX): HIV: NONREACTIVE

## 2018-03-23 LAB — SYNOVIAL CELL COUNT + DIFF, W/ CRYSTALS
BASOPHILS, %: 0 %
Eosinophils-Synovial: 0 % (ref 0–2)
Lymphocytes-Synovial Fld: 5 % (ref 0–74)
Monocyte/Macrophage: 11 % (ref 0–69)
Neutrophil, Synovial: 84 % — ABNORMAL HIGH (ref 0–24)
SYNOVIOCYTES, %: 0 % (ref 0–15)
WBC, Synovial: 26660 cells/uL — ABNORMAL HIGH (ref <150)

## 2018-03-23 LAB — ANAEROBIC AND AEROBIC CULTURE
AER RESULT: NO GROWTH
MICRO NUMBER: 90874787
MICRO NUMBER: 90874804
SPECIMEN QUALITY: ADEQUATE
SPECIMEN QUALITY: ADEQUATE

## 2018-03-25 ENCOUNTER — Encounter: Payer: Self-pay | Admitting: Rheumatology

## 2018-03-26 ENCOUNTER — Encounter: Payer: Self-pay | Admitting: Rheumatology

## 2018-03-26 ENCOUNTER — Ambulatory Visit (INDEPENDENT_AMBULATORY_CARE_PROVIDER_SITE_OTHER): Payer: BLUE CROSS/BLUE SHIELD | Admitting: Specialist

## 2018-03-26 ENCOUNTER — Ambulatory Visit: Payer: BLUE CROSS/BLUE SHIELD | Admitting: Rheumatology

## 2018-03-26 VITALS — BP 124/65 | HR 91 | Resp 16 | Ht 70.0 in | Wt 182.0 lb

## 2018-03-26 DIAGNOSIS — I10 Essential (primary) hypertension: Secondary | ICD-10-CM

## 2018-03-26 DIAGNOSIS — Z79899 Other long term (current) drug therapy: Secondary | ICD-10-CM

## 2018-03-26 DIAGNOSIS — R5383 Other fatigue: Secondary | ICD-10-CM

## 2018-03-26 DIAGNOSIS — M25462 Effusion, left knee: Secondary | ICD-10-CM | POA: Diagnosis not present

## 2018-03-26 DIAGNOSIS — M0609 Rheumatoid arthritis without rheumatoid factor, multiple sites: Secondary | ICD-10-CM | POA: Diagnosis not present

## 2018-03-26 DIAGNOSIS — M25411 Effusion, right shoulder: Secondary | ICD-10-CM | POA: Diagnosis not present

## 2018-03-26 DIAGNOSIS — M5030 Other cervical disc degeneration, unspecified cervical region: Secondary | ICD-10-CM

## 2018-03-26 MED ORDER — METHOTREXATE 2.5 MG PO TABS: ORAL_TABLET | ORAL | 0 refills | Status: DC

## 2018-03-26 MED ORDER — PREDNISONE 5 MG PO TABS: ORAL_TABLET | ORAL | 0 refills | Status: DC

## 2018-03-26 MED ORDER — FOLIC ACID 1 MG PO TABS: 2.0000 mg | ORAL_TABLET | Freq: Every day | ORAL | 2 refills | Status: DC

## 2018-03-26 NOTE — Progress Notes (Signed)
Office Visit Note  Patient: Benjamin Fields             Date of Birth: Feb 19, 1961           MRN: [id number]             PCP: Lennie Odor, PA-C Referring: Lennie Odor, PA-C Visit Date: 03/26/2018 Occupation: @GUAROCC @  Subjective:  Right shoulder pain   History of Present Illness: Benjamin Fields is a 57 y.o. male with history of seronegative rheumatoid arthritis and DDD.  Patient reports he continues to have discomfort in his right shoulder, left knee, and left hip.  He has has mild swelling in the left knee and right shoulder.  He states the left hip pain is most severe at night.  He states he has very little morning stiffness.  He feels a lot better after finishing the prednisone yesterday. He noticed significant benefit following the left knee aspiration and cortisone injection at his initial visit.  She continues to take tramadol for pain relief.     Activities of Daily Living:  Patient reports morning stiffness for  2 minutes.   Patient Reports nocturnal pain.  Difficulty dressing/grooming: Denies Difficulty climbing stairs: Denies Difficulty getting out of chair: Denies Difficulty using hands for taps, buttons, cutlery, and/or writing: Denies  Review of Systems  Constitutional: Positive for fatigue. Negative for night sweats.  HENT: Negative for mouth sores, trouble swallowing, trouble swallowing, mouth dryness and nose dryness.   Eyes: Negative for redness, visual disturbance and dryness.  Respiratory: Negative for cough, hemoptysis, shortness of breath and difficulty breathing.   Cardiovascular: Negative for chest pain, palpitations, hypertension, irregular heartbeat and swelling in legs/feet.  Gastrointestinal: Negative for blood in stool, constipation and diarrhea.  Endocrine: Negative for increased urination.  Genitourinary: Negative for painful urination.  Musculoskeletal: Positive for arthralgias, joint pain, joint swelling, myalgias and myalgias. Negative for muscle  weakness, morning stiffness and muscle tenderness.  Skin: Negative for color change, rash, hair loss, nodules/bumps, skin tightness, ulcers and sensitivity to sunlight.  Allergic/Immunologic: Negative for susceptible to infections.  Neurological: Negative for dizziness, fainting, memory loss, night sweats and weakness.  Hematological: Positive for swollen glands.  Psychiatric/Behavioral: Negative for depressed mood and sleep disturbance. The patient is not nervous/anxious.     PMFS History:  Patient Active Problem List   Diagnosis Date Noted  . Essential hypertension 03/17/2018  . DDD (degenerative disc disease), cervical 03/16/2018    Past Medical History:  Diagnosis Date  . Hypertension     Family History  Problem Relation Age of Onset  . Parkinson's disease Mother   . Heart failure Father   . Hypertension Father    Past Surgical History:  Procedure Laterality Date  . CERVICAL SPINE SURGERY    . TONSILLECTOMY     Social History   Social History Narrative  . Not on file    Objective: Vital Signs: BP 124/65 (BP Location: Left Arm, Patient Position: Sitting, Cuff Size: Normal)   Pulse 91   Resp 16   Ht 5' 10"  (1.778 m)   Wt 182 lb (82.6 kg)   BMI 26.11 kg/m    Physical Exam  Constitutional: He is oriented to person, place, and time. He appears well-developed and well-nourished.  HENT:  Head: Normocephalic and atraumatic.  Eyes: Pupils are equal, round, and reactive to light. Conjunctivae and EOM are normal.  Neck: Normal range of motion. Neck supple.  Cardiovascular: Normal rate, regular rhythm and normal heart sounds.  Pulmonary/Chest: Effort normal and breath sounds normal.  Abdominal: Soft. Bowel sounds are normal.  Lymphadenopathy:    He has no cervical adenopathy.  Neurological: He is alert and oriented to person, place, and time.  Skin: Skin is warm and dry. Capillary refill takes less than 2 seconds.  Psychiatric: He has a normal mood and affect. His  behavior is normal.  Nursing note and vitals reviewed.    Musculoskeletal Exam: C-spine limited ROM. Thoracic kyphosis.  Lumbar spine good ROM.  No midline spinal tenderness.  No SI joint tenderness.  Right shoulder limited abduction to 70 degrees with discomfort.  Effusion of right shoulder noted.  Left shoulder full ROM with no discomfort or warmth or effusion.  Elbow joints, wrist joints, MCPs, PIPs, and DIPs good ROM.  Left 4th PIP joint synovitis and tenderness.  Painful ROM of left hip.  Right hip full ROM with no discomfort.  He has mild swelling of the left knee joint.  Full ROM of bilateral ankle joints. No achilles tendonitis or plantar fasciitis.    CDAI Exam: CDAI Homunculus Exam:   Tenderness:  Left hand: 4th PIP  Swelling:  Left hand: 4th PIP  Joint Counts:  CDAI Tender Joint count: 1 CDAI Swollen Joint count: 1  Global Assessments:  Patient Global Assessment: 2 Provider Global Assessment: 2  CDAI Calculated Score: 6   Investigation: Findings:   03/05/2018 ESR 53, CRP 96.4, RF negative ,anti-CCP negative      Imaging: Mr Humerus Right Wo Contrast  Result Date: 03/15/2018 CLINICAL DATA:  Right shoulder pain. Increased pain when lifting arm for 3-4 months. EXAM: MRI OF THE RIGHT HUMERUS WITHOUT CONTRAST TECHNIQUE: Multiplanar, multisequence MR imaging of the shoulder was performed. No intravenous contrast was administered. COMPARISON:  Radiograph 03/05/2018 FINDINGS: Rotator cuff: Mild infraspinatus and subscapularis tendinopathy/tendinosis. Moderate supraspinatus tendinopathy with small interstitial tears. There is also a shallow articular surface tear anteriorly measuring 6 x 6 mm. No full-thickness retracted tear. Muscles:  Normal Biceps long head: Intact. Mild tendinopathy involving the intra-articular portion. Acromioclavicular Joint: Mild degenerative changes type 1 acromion. Mild lateral downsloping but no undersurface spurring. Glenohumeral Joint: Mild  degenerative changes and small to moderate-sized joint effusion. There is also moderate synovitis and moderate rotator interval synovitis. Labrum:  No definite labral tears. Bones:  No acute bony findings. Other: Large amount of fluid in the subacromial/subdeltoid bursa and also in the subcoracoid bursa and surrounding the biceps tendon. Numerous septations are noted along with synovitis. IMPRESSION: 1. Moderate supraspinatus tendinopathy/tendinosis with small interstitial tears and a shallow articular surface tear. No full-thickness retracted tear. 2. Glenohumeral joint effusion and synovitis. There is also a large amount of fluid in the subacromial/subdeltoid bursa and subcoracoid bursa with septations and possible synovitis. Patient also had a large knee joint effusion and possibility of an inflammatory arthritis is possible. 3. No significant findings for bony impingement. 4. Intact long head biceps tendon and glenoid labrum. Tendinopathy involving the intra-articular portion of the biceps tendon. Electronically Signed   By: Marijo Sanes M.D.   On: 03/15/2018 11:27   Mr Knee Left W/o Contrast  Result Date: 03/15/2018 CLINICAL DATA:  Knee pain and swelling. EXAM: MRI OF THE LEFT KNEE WITHOUT CONTRAST TECHNIQUE: Multiplanar, multisequence MR imaging of the knee was performed. No intravenous contrast was administered. COMPARISON:  None. FINDINGS: MENISCI Medial meniscus: Intrasubstance degenerative type signal changes involving the posterior horn but no discrete tear. Lateral meniscus:  Intact LIGAMENTS Cruciates:  Intact Collaterals:  Intact CARTILAGE Patellofemoral:  Mild degenerative chondrosis. There is also slight lateral tilt of the patella and a very short steep medial facet. Narrowing of the lateral patellofemoral joint. Medial:  Mild degenerative chondrosis. Lateral:  Mild degenerative chondrosis. Joint: Large joint effusion and mild synovitis. Superior and medial patellar plica noted. Popliteal Fossa:   Small leaking Baker's cyst. Extensor Mechanism: The patella retinacular structures are intact and the quadriceps and patellar tendons are intact. Mild lateral tilt of the patella. The TT-TG distance is slightly widened at 21 mm. There is also edema like signal abnormality in the upper lateral aspect of Hoffa's fat which may suggest lateral patellar compression syndrome. Bones: No acute bony findings. No bone contusion, marrow edema or osteochondral lesion. Other: Nonspecific edema like signal abnormality in the vastus medialis and lateralis muscles. This could represent muscle strain or overuse syndrome. Nonspecific myositis could be related to collagen vascular disease, polymyositis or possible drug reaction (statins). The hamstring muscles appear normal. IMPRESSION: 1. Patellofemoral joint degenerative changes and findings suggest lateral patellar compression syndrome. Slight lateral tilt of the patella and widened TT-TG distance. 2. Intact ligamentous structures and no acute bony findings. 3. Degenerative changes involving the posterior horn of the medial meniscus but no discrete tear. 4. Large joint effusion and mild synovitis. Small leaking Baker's cyst. 5. Nonspecific edema like signal abnormality in the vastus medialis and lateralis muscles as discussed above. Electronically Signed   By: Marijo Sanes M.D.   On: 03/15/2018 11:18   Xr Shoulder Right  Result Date: 03/13/2018 Right shoulder radiographs AP, axillary lateral and outlet view demonstrate no significant G-H joint narrowing, soft tissue density consistent with mass effect right mid and upper posterior humerus, minimal sclerosis greater tuberosity. AC joint with mild enlargement of the distal clavicle but joint line is not narrowed. Subacromial space is well Maintained. Findings of soft tissue swelling in the right upper posterior humerus are concerning for soft tissue mass, inflamatory granulomatous Vs malignancy, findings discussed with the  radiologist on call for [facility] Radiology Dr. D.    Recent Labs: Lab Results  Component Value Date   WBC 10.3 03/17/2018   HGB 14.9 03/17/2018   PLT 359 03/17/2018   NA 138 03/05/2018   K 4.7 03/05/2018   CL 102 03/05/2018   CO2 26 03/05/2018   GLUCOSE 95 03/05/2018   BUN 19 03/05/2018   CREATININE 1.16 03/05/2018   BILITOT 0.5 03/05/2018   AST 12 03/05/2018   ALT 15 03/05/2018   PROT 6.6 03/17/2018   CALCIUM 9.1 03/05/2018   QFTBGOLDPLUS NEGATIVE 03/17/2018  March 17, 2018 UA negative, TB Gold negative, IgG level, TSH normal, CK normal, HIV negative, hepatitis B-, hepatitis C negative, G6PD normal, IFE negative Uric acid 6.2, ACE 40, ANA negative, parvo negative, RMSF negative, 14 3 3  eta negative 03/17/2018 synovial fluid WBC 26,660 84% neutrophils, crystals negative, culture negative  Speciality Comments: No specialty comments available.  Procedures:  No procedures performed Allergies: Cortisone   Assessment / Plan:     Visit Diagnoses: Rheumatoid arthritis of multiple sites with negative rheumatoid factor (HCC) - RF-, CCP-, 14-3-3 eta negative.  Patient has severe inflammatory polyarthritis which responded quite well to prednisone.  He had minimal synovitis on examination today.  He just finished prednisone taper.  We had detailed discussion regarding rheumatoid arthritis.  Different treatment options and their side effects were discussed at length.  After reviewing indications side effects contraindications we decided to proceed with methotrexate.  Handout was given and consent was taken.  The plan is to start him on methotrexate 6 tablets p.o. weekly for 2 weeks and if labs are stable we will increase it to 8 tablets p.o. weekly.  He will take folic acid 2 mg p.o. daily.  I will also give him another prednisone taper starting at 10 mg p.o. daily for 1 week and then taper by 2.5 mg every week.  He has been advised to get pneumococcal vaccine and Shingrix vaccine.  He will also  get flu vaccine when needed.  Drug Counseling TB Gold: Negative Hepatitis panel: Negative  Chest-xray: Normal in 2007  Contraception: Not indicated  Alcohol use: Discussed  Patient was counseled on the purpose, proper use, and adverse effects of methotrexate including nausea, infection, and signs and symptoms of pneumonitis.  Reviewed instructions with patient to take methotrexate weekly along with folic acid daily.  Discussed the importance of frequent monitoring of kidney and liver function and blood counts, and provided patient with standing lab instructions.  Counseled patient to avoid NSAIDs and alcohol while on methotrexate.  Provided patient with educational materials on methotrexate and answered all questions.  Advised patient to get annual influenza vaccine and to get a pneumococcal vaccine if patient has not already had one.  Patient voiced understanding.  Patient consented to methotrexate use.  Will upload into chart.    High risk medication use-patient will be starting on methotrexate and prednisone taper.  Effusion of right shoulder-resolved on prednisone although he still have some discomfort with range of motion of his shoulder.  MRI findings of the shoulder joint were also discussed with patient and his wife at length per their request.  Effusion of left knee-he had minimal discomfort with left knee joint range of motion currently.  Essential hypertension  Other fatigue  DDD (degenerative disc disease), cervical - S/p fusion   Orders: Orders Placed This Encounter  Procedures  . CBC with Differential/Platelet  . COMPLETE METABOLIC PANEL WITH GFR   Meds ordered this encounter  Medications  . methotrexate (RHEUMATREX) 2.5 MG tablet    Sig: Take 6 tabs by mouth once weekly for 2 weeks, if labs are stable increase to 8 tabs once weekly. Caution:Chemotherapy. Protect from light.    Dispense:  28 tablet    Refill:  0  . folic acid (FOLVITE) 1 MG tablet    Sig: Take 2  tablets (2 mg total) by mouth daily.    Dispense:  180 tablet    Refill:  2  . predniSONE (DELTASONE) 5 MG tablet    Sig: Take 2 tablets by mouth x7 days, take 1.5 tablets by mouth x7 days, take 1 tablet by mouth x7 days, take 1/2 tablet by mouth x7 days.    Dispense:  35 tablet    Refill:  0    Face-to-face time spent with patient was 50 minutes. Greater than 50% of time was spent in counseling and coordination of care.  Follow-Up Instructions: Return in about 8 weeks (around 05/21/2018) for Rheumatoid arthritis, DDD.   Bo Merino, MD  Note - This record has been created using Editor, commissioning.  Chart creation errors have been sought, but may not always  have been located. Such creation errors do not reflect on  the standard of medical care.

## 2018-03-26 NOTE — Patient Instructions (Addendum)
Standing Labs We placed an order today for your standing lab work.    Please come back and get your standing labs in 2 weeks x2, then 2 months, then every 3 months  We have open lab Monday through Friday from 8:30-11:30 AM and 1:30-4:00 PM  at the office of Dr. Bo Merino.   You may experience shorter wait times on Monday and Friday afternoons. The office is located at [address] [place], [place], [place] [address] No appointment is necessary.   Labs are drawn by [facility].  You may receive a bill from [facility] for your lab work. If you have any questions regarding directions or hours of operation,  please call [phone].      Methotrexate tablets What is this medicine? METHOTREXATE (METH oh TREX ate) is a chemotherapy drug used to treat cancer including breast cancer, leukemia, and lymphoma. This medicine can also be used to treat psoriasis and certain kinds of arthritis. This medicine may be used for other purposes; ask your health care provider or pharmacist if you have questions. COMMON BRAND NAME(S): Rheumatrex, Trexall What should I tell my health care provider before I take this medicine? They need to know if you have any of these conditions: -fluid in the stomach area or lungs -if you often drink alcohol -infection or immune system problems -kidney disease or on hemodialysis -liver disease -low blood counts, like low white cell, platelet, or red cell counts -lung disease -radiation therapy -stomach ulcers -ulcerative colitis -an unusual or allergic reaction to methotrexate, other medicines, foods, dyes, or preservatives -pregnant or trying to get pregnant -breast-feeding How should I use this medicine? Take this medicine by mouth with a glass of water. Follow the directions on the prescription label. Take your medicine at regular intervals. Do not take it more often than directed. Do not stop taking except on your doctor's advice. Make sure you know why you  are taking this medicine and how often you should take it. If this medicine is used for a condition that is not cancer, like arthritis or psoriasis, it should be taken weekly, NOT daily. Taking this medicine more often than directed can cause serious side effects, even death. Talk to your healthcare provider about safe handling and disposal of this medicine. You may need to take special precautions. Talk to your pediatrician regarding the use of this medicine in children. While this drug may be prescribed for selected conditions, precautions do apply. Overdosage: If you think you have taken too much of this medicine contact a poison control center or emergency room at once. NOTE: This medicine is only for you. Do not share this medicine with others. What if I miss a dose? If you miss a dose, talk with your doctor or health care professional. Do not take double or extra doses. What may interact with this medicine? This medicine may interact with the following medication: -acitretin -aspirin and aspirin-like medicines including salicylates -azathioprine -certain antibiotics like penicillins, tetracycline, and chloramphenicol -cyclosporine -gold -hydroxychloroquine -live virus vaccines -NSAIDs, medicines for pain and inflammation, like ibuprofen or naproxen -other cytotoxic agents -penicillamine -phenylbutazone -phenytoin -probenecid -retinoids such as isotretinoin and tretinoin -steroid medicines like prednisone or cortisone -sulfonamides like sulfasalazine and trimethoprim/sulfamethoxazole -theophylline This list may not describe all possible interactions. Give your health care provider a list of all the medicines, herbs, non-prescription drugs, or dietary supplements you use. Also tell them if you smoke, drink alcohol, or use illegal drugs. Some items may interact with your medicine. What  should I watch for while using this medicine? Avoid alcoholic drinks. This medicine can make you  more sensitive to the sun. Keep out of the sun. If you cannot avoid being in the sun, wear protective clothing and use sunscreen. Do not use sun lamps or tanning beds/booths. You may need blood work done while you are taking this medicine. Call your doctor or health care professional for advice if you get a fever, chills or sore throat, or other symptoms of a cold or flu. Do not treat yourself. This drug decreases your body's ability to fight infections. Try to avoid being around people who are sick. This medicine may increase your risk to bruise or bleed. Call your doctor or health care professional if you notice any unusual bleeding. Check with your doctor or health care professional if you get an attack of severe diarrhea, nausea and vomiting, or if you sweat a lot. The loss of too much body fluid can make it dangerous for you to take this medicine. Talk to your doctor about your risk of cancer. You may be more at risk for certain types of cancers if you take this medicine. Both men and women must use effective birth control with this medicine. Do not become pregnant while taking this medicine or until at least 1 normal menstrual cycle has occurred after stopping it. Women should inform their doctor if they wish to become pregnant or think they might be pregnant. Men should not father a child while taking this medicine and for 3 months after stopping it. There is a potential for serious side effects to an unborn child. Talk to your health care professional or pharmacist for more information. Do not breast-feed an infant while taking this medicine. What side effects may I notice from receiving this medicine? Side effects that you should report to your doctor or health care professional as soon as possible: -allergic reactions like skin rash, itching or hives, swelling of the face, lips, or tongue -breathing problems or shortness of breath -diarrhea -dry, nonproductive cough -low blood counts - this  medicine may decrease the number of white blood cells, red blood cells and platelets. You may be at increased risk for infections and bleeding. -mouth sores -redness, blistering, peeling or loosening of the skin, including inside the mouth -signs of infection - fever or chills, cough, sore throat, pain or trouble passing urine -signs and symptoms of bleeding such as bloody or black, tarry stools; red or dark-brown urine; spitting up blood or brown material that looks like coffee grounds; red spots on the skin; unusual bruising or bleeding from the eye, gums, or nose -signs and symptoms of kidney injury like trouble passing urine or change in the amount of urine -signs and symptoms of liver injury like dark yellow or brown urine; general ill feeling or flu-like symptoms; light-colored stools; loss of appetite; nausea; right upper belly pain; unusually weak or tired; yellowing of the eyes or skin Side effects that usually do not require medical attention (report to your doctor or health care professional if they continue or are bothersome): -dizziness -hair loss -tiredness -upset stomach -vomiting This list may not describe all possible side effects. Call your doctor for medical advice about side effects. You may report side effects to FDA at 1-800-FDA-1088. Where should I keep my medicine? Keep out of the reach of children. Store at room temperature between 20 and 25 degrees C (68 and 77 degrees F). Protect from light. Throw away any unused medicine after  the expiration date. NOTE: This sheet is a summary. It may not cover all possible information. If you have questions about this medicine, talk to your doctor, pharmacist, or health care provider.  2018 Elsevier/Gold Standard (2015-04-17 05:39:22)  Please get pneumococcal vaccine and Shingrix vaccine.

## 2018-04-03 ENCOUNTER — Encounter: Payer: Self-pay | Admitting: Rheumatology

## 2018-04-06 ENCOUNTER — Other Ambulatory Visit: Payer: Self-pay

## 2018-04-06 DIAGNOSIS — Z79899 Other long term (current) drug therapy: Secondary | ICD-10-CM | POA: Diagnosis not present

## 2018-04-07 LAB — CBC WITH DIFFERENTIAL/PLATELET
BASOS ABS: 39 {cells}/uL (ref 0–200)
Basophils Relative: 0.5 %
Eosinophils Absolute: 139 cells/uL (ref 15–500)
Eosinophils Relative: 1.8 %
HEMATOCRIT: 40.7 % (ref 38.5–50.0)
Hemoglobin: 13.9 g/dL (ref 13.2–17.1)
LYMPHS ABS: 1971 {cells}/uL (ref 850–3900)
MCH: 30.9 pg (ref 27.0–33.0)
MCHC: 34.2 g/dL (ref 32.0–36.0)
MCV: 90.4 fL (ref 80.0–100.0)
MPV: 9.9 fL (ref 7.5–12.5)
Monocytes Relative: 6.6 %
NEUTROS PCT: 65.5 %
Neutro Abs: 5044 cells/uL (ref 1500–7800)
Platelets: 194 10*3/uL (ref 140–400)
RBC: 4.5 10*6/uL (ref 4.20–5.80)
RDW: 14.6 % (ref 11.0–15.0)
Total Lymphocyte: 25.6 %
WBC: 7.7 10*3/uL (ref 3.8–10.8)
WBCMIX: 508 {cells}/uL (ref 200–950)

## 2018-04-07 LAB — COMPLETE METABOLIC PANEL WITH GFR
AG Ratio: 2.1 (calc) (ref 1.0–2.5)
ALKALINE PHOSPHATASE (APISO): 67 U/L (ref 40–115)
ALT: 29 U/L (ref 9–46)
AST: 13 U/L (ref 10–35)
Albumin: 4.1 g/dL (ref 3.6–5.1)
BILIRUBIN TOTAL: 0.7 mg/dL (ref 0.2–1.2)
BUN: 16 mg/dL (ref 7–25)
CHLORIDE: 105 mmol/L (ref 98–110)
CO2: 26 mmol/L (ref 20–32)
Calcium: 9.4 mg/dL (ref 8.6–10.3)
Creat: 1.1 mg/dL (ref 0.70–1.33)
GFR, Est African American: 85 mL/min/{1.73_m2} (ref >=60)
GFR, Est Non African American: 73 mL/min/{1.73_m2} (ref >=60)
GLUCOSE: 105 mg/dL — AB (ref 65–99)
Globulin: 2 g/dL (calc) (ref 1.9–3.7)
Potassium: 4.4 mmol/L (ref 3.5–5.3)
Sodium: 139 mmol/L (ref 135–146)
Total Protein: 6.1 g/dL (ref 6.1–8.1)

## 2018-04-15 ENCOUNTER — Encounter: Payer: Self-pay | Admitting: Rheumatology

## 2018-04-15 ENCOUNTER — Ambulatory Visit: Payer: BLUE CROSS/BLUE SHIELD | Admitting: Rheumatology

## 2018-04-16 NOTE — Telephone Encounter (Signed)
Attempted to contact the patient and left message for patient to call the office.  

## 2018-04-17 NOTE — Progress Notes (Deleted)
   Office Visit Note  Patient: Benjamin Fields             Date of Birth: 12-15-1960           MRN: [id number]             PCP: Lennie Odor, PA-C Referring: Lennie Odor, PA-C Visit Date: 05/01/2018 Occupation: @GUAROCC @  Subjective:  No chief complaint on file.   History of Present Illness: Benjamin Fields is a 57 y.o. male ***   Activities of Daily Living:  Patient reports morning stiffness for *** {minute/hour:19697}.   Patient {ACTIONS;DENIES/REPORTS:21021675::"Denies"} nocturnal pain.  Difficulty dressing/grooming: {ACTIONS;DENIES/REPORTS:21021675::"Denies"} Difficulty climbing stairs: {ACTIONS;DENIES/REPORTS:21021675::"Denies"} Difficulty getting out of chair: {ACTIONS;DENIES/REPORTS:21021675::"Denies"} Difficulty using hands for taps, buttons, cutlery, and/or writing: {ACTIONS;DENIES/REPORTS:21021675::"Denies"}  No Rheumatology ROS completed.   PMFS History:  Patient Active Problem List   Diagnosis Date Noted  . Essential hypertension 03/17/2018  . DDD (degenerative disc disease), cervical 03/16/2018    Past Medical History:  Diagnosis Date  . Hypertension     Family History  Problem Relation Age of Onset  . Parkinson's disease Mother   . Heart failure Father   . Hypertension Father    Past Surgical History:  Procedure Laterality Date  . CERVICAL SPINE SURGERY    . TONSILLECTOMY     Social History   Social History Narrative  . Not on file    Objective: Vital Signs: There were no vitals taken for this visit.   Physical Exam   Musculoskeletal Exam: ***  CDAI Exam: CDAI Score: Not documented Patient Global Assessment: Not documented; Provider Global Assessment: Not documented Swollen: Not documented; Tender: Not documented Joint Exam   Not documented   There is currently no information documented on the homunculus. Go to the Rheumatology activity and complete the homunculus joint exam.  Investigation: No additional findings.  Imaging: No  results found.  Recent Labs: Lab Results  Component Value Date   WBC 7.7 04/06/2018   HGB 13.9 04/06/2018   PLT 194 04/06/2018   NA 139 04/06/2018   K 4.4 04/06/2018   CL 105 04/06/2018   CO2 26 04/06/2018   GLUCOSE 105 (H) 04/06/2018   BUN 16 04/06/2018   CREATININE 1.10 04/06/2018   BILITOT 0.7 04/06/2018   AST 13 04/06/2018   ALT 29 04/06/2018   PROT 6.1 04/06/2018   CALCIUM 9.4 04/06/2018   GFRAA 85 04/06/2018   QFTBGOLDPLUS NEGATIVE 03/17/2018    Speciality Comments: No specialty comments available.  Procedures:  No procedures performed Allergies: Cortisone   Assessment / Plan:     Visit Diagnoses: No diagnosis found.   Orders: No orders of the defined types were placed in this encounter.  No orders of the defined types were placed in this encounter.   Face-to-face time spent with patient was *** minutes. Greater than 50% of time was spent in counseling and coordination of care.  Follow-Up Instructions: No follow-ups on file.   Earnestine Mealing, CMA  Note - This record has been created using Editor, commissioning.  Chart creation errors have been sought, but may not always  have been located. Such creation errors do not reflect on  the standard of medical care.

## 2018-04-20 ENCOUNTER — Encounter: Payer: Self-pay | Admitting: Rheumatology

## 2018-04-20 ENCOUNTER — Other Ambulatory Visit: Payer: Self-pay

## 2018-04-20 DIAGNOSIS — Z79899 Other long term (current) drug therapy: Secondary | ICD-10-CM | POA: Diagnosis not present

## 2018-04-20 NOTE — Telephone Encounter (Signed)
Per Lovena Le Dale-PA-C scheduled patient to be seen on 04/21/18 at 2 pm.

## 2018-04-20 NOTE — Telephone Encounter (Signed)
Patient states he has started to experience itching, red rash and raised bumps on the front and back of my torso. Patient states he has been on the MTX for 3 weeks. Patient states he had dry skin type itching prior to taking the MTX. Patient states the itching has gotten worse, patient states the rash started over the weekend. Patient states his last dose of MTX was on 04/18/18. Patient states this was his second time taking 8 tablets. Please advise.

## 2018-04-21 ENCOUNTER — Encounter: Payer: Self-pay | Admitting: Rheumatology

## 2018-04-21 ENCOUNTER — Telehealth: Payer: Self-pay | Admitting: Pharmacy Technician

## 2018-04-21 ENCOUNTER — Other Ambulatory Visit: Payer: Self-pay | Admitting: Pharmacist

## 2018-04-21 ENCOUNTER — Ambulatory Visit: Payer: BLUE CROSS/BLUE SHIELD | Admitting: Rheumatology

## 2018-04-21 VITALS — BP 131/79 | HR 80 | Resp 14 | Ht 70.0 in | Wt 187.0 lb

## 2018-04-21 DIAGNOSIS — M25462 Effusion, left knee: Secondary | ICD-10-CM

## 2018-04-21 DIAGNOSIS — M5030 Other cervical disc degeneration, unspecified cervical region: Secondary | ICD-10-CM

## 2018-04-21 DIAGNOSIS — M0609 Rheumatoid arthritis without rheumatoid factor, multiple sites: Secondary | ICD-10-CM | POA: Diagnosis not present

## 2018-04-21 DIAGNOSIS — M25411 Effusion, right shoulder: Secondary | ICD-10-CM

## 2018-04-21 DIAGNOSIS — R5383 Other fatigue: Secondary | ICD-10-CM

## 2018-04-21 DIAGNOSIS — Z79899 Other long term (current) drug therapy: Secondary | ICD-10-CM

## 2018-04-21 DIAGNOSIS — B360 Pityriasis versicolor: Secondary | ICD-10-CM

## 2018-04-21 DIAGNOSIS — I10 Essential (primary) hypertension: Secondary | ICD-10-CM

## 2018-04-21 LAB — COMPLETE METABOLIC PANEL WITH GFR
AG Ratio: 2.1 (calc) (ref 1.0–2.5)
ALT: 25 U/L (ref 9–46)
AST: 15 U/L (ref 10–35)
Albumin: 4.4 g/dL (ref 3.6–5.1)
Alkaline phosphatase (APISO): 76 U/L (ref 40–115)
BUN: 16 mg/dL (ref 7–25)
CALCIUM: 10 mg/dL (ref 8.6–10.3)
CO2: 30 mmol/L (ref 20–32)
CREATININE: 1.28 mg/dL (ref 0.70–1.33)
Chloride: 106 mmol/L (ref 98–110)
GFR, EST NON AFRICAN AMERICAN: 61 mL/min/{1.73_m2} (ref >=60)
GFR, Est African American: 71 mL/min/{1.73_m2} (ref >=60)
Globulin: 2.1 g/dL (calc) (ref 1.9–3.7)
Glucose, Bld: 96 mg/dL (ref 65–99)
POTASSIUM: 4.4 mmol/L (ref 3.5–5.3)
Sodium: 143 mmol/L (ref 135–146)
Total Bilirubin: 0.7 mg/dL (ref 0.2–1.2)
Total Protein: 6.5 g/dL (ref 6.1–8.1)

## 2018-04-21 LAB — CBC WITH DIFFERENTIAL/PLATELET
BASOS ABS: 88 {cells}/uL (ref 0–200)
Basophils Relative: 1.2 %
Eosinophils Absolute: 299 cells/uL (ref 15–500)
Eosinophils Relative: 4.1 %
HCT: 42.3 % (ref 38.5–50.0)
HEMOGLOBIN: 14.4 g/dL (ref 13.2–17.1)
Lymphs Abs: 2248 cells/uL (ref 850–3900)
MCH: 30.8 pg (ref 27.0–33.0)
MCHC: 34 g/dL (ref 32.0–36.0)
MCV: 90.4 fL (ref 80.0–100.0)
MPV: 9.7 fL (ref 7.5–12.5)
Monocytes Relative: 7.4 %
Neutro Abs: 4125 cells/uL (ref 1500–7800)
Neutrophils Relative %: 56.5 %
PLATELETS: 261 10*3/uL (ref 140–400)
RBC: 4.68 10*6/uL (ref 4.20–5.80)
RDW: 15.3 % — ABNORMAL HIGH (ref 11.0–15.0)
TOTAL LYMPHOCYTE: 30.8 %
WBC mixed population: 540 cells/uL (ref 200–950)
WBC: 7.3 10*3/uL (ref 3.8–10.8)

## 2018-04-21 MED ORDER — SELENIUM SULFIDE 2.3 % EX SHAM: 1.0000 "application " | MEDICATED_SHAMPOO | Freq: Every day | CUTANEOUS | 0 refills | Status: AC

## 2018-04-21 MED ORDER — FLUCONAZOLE 150 MG PO TABS: ORAL_TABLET | ORAL | 0 refills | Status: DC

## 2018-04-21 MED ORDER — METHOTREXATE (PF) 20 MG/0.4ML ~~LOC~~ SOAJ: 20.0000 mg | SUBCUTANEOUS | 0 refills | Status: DC

## 2018-04-21 MED ORDER — PREDNISONE 5 MG PO TABS: ORAL_TABLET | ORAL | 0 refills | Status: DC

## 2018-04-21 NOTE — Progress Notes (Signed)
[facility] sent fax informing our office that the selenium 2.3% shampoo was not covered under insurance and would cost the patient $500.  Renee test claim through [facility] long outpatient pharmacy for the selenium 2.5% shampoo which went through for a $10 co-pay.  Faxed [facility] with instructions to fill the prescription for selenium 2.5% shampoo.  Will send document to scan center.

## 2018-04-21 NOTE — Patient Instructions (Signed)
Standing Labs We placed an order today for your standing lab work.    Please come back and get your standing labs in 1 month and then every 3 months  We have open lab Monday through Friday from 8:30-11:30 AM and 1:30-4:00 PM  at the office of Dr. Faruq Rosenberger.   You may experience shorter wait times on Monday and Friday afternoons. The office is located at [address], [address], [place], [place] [address] No appointment is necessary.   Labs are drawn by [facility].  You may receive a bill from [facility] for your lab work. If you have any questions regarding directions or hours of operation,  please call [phone].    

## 2018-04-21 NOTE — Telephone Encounter (Signed)
Received a Prior Authorization request from [employer] for Rasuvo 20mg . Authorization has been submitted to patient's insurance via Cover My Meds. Will update once we receive a response.  4:26 PM Beatriz Chancellor, CPhT

## 2018-04-21 NOTE — Progress Notes (Signed)
Office Visit Note  Patient: Benjamin Fields             Date of Birth: 1961/04/25           MRN: [id number]             PCP: Lennie Odor, PA-C Referring: Lennie Odor, PA-C Visit Date: 04/21/2018 Occupation: @GUAROCC @  Subjective:  Pain and swelling in multiple joints..   History of Present Illness: Benjamin Fields is a 57 y.o. male with history of rheumatoid arthritis and DDD.  He has been on methotrexate for 4 weeks now.  He has been on methotrexate 8 tablets p.o. weekly for the last 2 weeks.  He states after his last dose of methotrexate on Saturday on Sunday he developed a rash all over his trunk.  And there was a localized area on his back which was extremely painful.  The symptoms got worse over the next 2 days and then eased off after he used some topical agents.  Since he has decreased the prednisone dose his joint pain symptoms have increased.  He describes increased pain in his left hip joint and also ongoing discomfort in his right shoulder joint.  He again has difficulty raising his right arm.  Activities of Daily Living:  Patient reports morning stiffness for 0 minutes.   Patient Reports nocturnal pain.  Difficulty dressing/grooming: Reports Difficulty climbing stairs: Denies Difficulty getting out of chair: Denies Difficulty using hands for taps, buttons, cutlery, and/or writing: Denies  Review of Systems  Constitutional: Positive for fatigue. Negative for night sweats.  HENT: Positive for mouth dryness. Negative for mouth sores, trouble swallowing, trouble swallowing and nose dryness.   Eyes: Negative for redness, itching and dryness.  Respiratory: Negative for shortness of breath, wheezing and difficulty breathing.   Cardiovascular: Negative for chest pain, palpitations, hypertension, irregular heartbeat and swelling in legs/feet.  Gastrointestinal: Negative for abdominal pain, constipation and diarrhea.  Endocrine: Negative for increased urination.  Genitourinary:  Negative for painful urination.  Musculoskeletal: Positive for arthralgias, joint pain, joint swelling and morning stiffness. Negative for myalgias, muscle weakness, muscle tenderness and myalgias.  Skin: Positive for rash. Negative for color change, hair loss, nodules/bumps, skin tightness, ulcers and sensitivity to sunlight.  Allergic/Immunologic: Negative for susceptible to infections.  Neurological: Negative for dizziness, fainting, light-headedness, headaches, memory loss, night sweats and weakness.  Hematological: Negative for bruising/bleeding tendency and swollen glands.  Psychiatric/Behavioral: Negative for depressed mood, confusion and sleep disturbance. The patient is not nervous/anxious.     PMFS History:  Patient Active Problem List   Diagnosis Date Noted  . Essential hypertension 03/17/2018  . DDD (degenerative disc disease), cervical 03/16/2018    Past Medical History:  Diagnosis Date  . Hypertension     Family History  Problem Relation Age of Onset  . Parkinson's disease Mother   . Heart failure Father   . Hypertension Father    Past Surgical History:  Procedure Laterality Date  . CERVICAL SPINE SURGERY    . TONSILLECTOMY     Social History   Social History Narrative  . Not on file    Objective: Vital Signs: BP 131/79 (BP Location: Left Arm, Patient Position: Sitting, Cuff Size: Normal)   Pulse 80   Resp 14   Ht 5\' 10"  (1.778 m)   Wt 187 lb (84.8 kg)   BMI 26.83 kg/m    Physical Exam  Constitutional: He is oriented to person, place, and time. He appears well-developed and well-nourished.  HENT:  Head: Normocephalic and atraumatic.  Eyes: Pupils are equal, round, and reactive to light. Conjunctivae and EOM are normal.  Neck: Normal range of motion. Neck supple.  Cardiovascular: Normal rate, regular rhythm and normal heart sounds.  Pulmonary/Chest: Effort normal and breath sounds normal.  Abdominal: Soft. Bowel sounds are normal.  Neurological: He  is alert and oriented to person, place, and time.  Skin: Skin is warm and dry. Capillary refill takes less than 2 seconds.  Erythematous hypo-and hyperpigmented lesions covering the trunk consistent with tinea versicolor  Psychiatric: He has a normal mood and affect. His behavior is normal.  Nursing note and vitals reviewed.    Musculoskeletal Exam: C-spine thoracic lumbar spine good range of motion.  He had discomfort painful range of motion of his right shoulder joint with abduction limited to 100 degrees.  No effusion was palpable today.  Elbow joints were in good range of motion.  Some of the PIPs were swollen as marked below.  He had discomfort range of motion of left hip joint.  No warmth swelling or effusion was noted in the knee joints.  CDAI Exam: CDAI Score: Not documented Patient Global Assessment: 7 (mm); Provider Global Assessment: Not documented Swollen: 3 ; Tender: 4  Joint Exam      Right  Left  Glenohumeral  Swollen Tender     PIP 4     Swollen Tender  PIP 5  Swollen Tender     Hip      Tender     Investigation: No additional findings.  Imaging: No results found.  Recent Labs: Lab Results  Component Value Date   WBC 7.3 04/20/2018   HGB 14.4 04/20/2018   PLT 261 04/20/2018   NA 143 04/20/2018   K 4.4 04/20/2018   CL 106 04/20/2018   CO2 30 04/20/2018   GLUCOSE 96 04/20/2018   BUN 16 04/20/2018   CREATININE 1.28 04/20/2018   BILITOT 0.7 04/20/2018   AST 15 04/20/2018   ALT 25 04/20/2018   PROT 6.5 04/20/2018   CALCIUM 10.0 04/20/2018   GFRAA 71 04/20/2018   QFTBGOLDPLUS NEGATIVE 03/17/2018    Speciality Comments: No specialty comments available.  Procedures:  No procedures performed Allergies: Cortisone   Assessment / Plan:     Visit Diagnoses: Rheumatoid arthritis of multiple sites with negative rheumatoid factor (HCC) - RF-, CCP-, 14-3-3 eta negative.  Patient is having a flare of rheumatoid arthritis on the lower dose of prednisone.  He has  been tolerating methotrexate but having difficulty swallowing 8 pills once a week.  He would like to go on subcutaneous methotrexate.  Due to aggressive ligature of his arthritis he will probably need more aggressive therapy in future.  I would like to try subcutaneous methotrexate first.  My plan is to start him on Rasuvo 20 mg subcu weekly.  He will discontinue oral methotrexate.  If he had an adequate response over the next 3 to 4 weeks we may add Enbrel to his therapy.  I will also increase his prednisone to 10 mg p.o. daily for couple of weeks and then decrease by 2.5 mg every 2 weeks.  High risk medication use - MTX 8 tabs po qwk, folic acid 2mg  po qd, prednisone 2.5 mg po qd..  His labs are stable.  Effusion of left knee-resolved after the cortisone injection.  Effusion of right shoulder-he is having increased pain and discomfort in his right shoulder on decreased dose of prednisone.  He had no  effusion on palpation today.  Other fatigue-he has been experiencing increased fatigue since he has been on lower dose of prednisone.  DDD (degenerative disc disease), cervical - S/p fusion.  He has limited range of motion.  Tinea versicolor-he has a rash on his trunk consistent with tinea versicolor.  Patient recalls that he has had similar rash in the past prior to starting methotrexate.  I have advised him to use Diflucan 150 mg 2 tablets p.o. weekly x2.  Side effects were discussed.  We will check his labs again  in a month.  I have been also advised to use selenium sulfide shampoo to wash his body.  Essential hypertension   Orders: No orders of the defined types were placed in this encounter.  Meds ordered this encounter  Medications  . Methotrexate, PF, 20 MG/0.4ML SOAJ    Sig: Inject 20 mg into the skin once a week.    Dispense:  12 pen    Refill:  0  . predniSONE (DELTASONE) 5 MG tablet    Sig: Take 2 tablets by mouth daily x2 weeks, 1.5 tablets x2 weeks, 1 tablet x2 weeks, 1/2 tablet  x2 weeks.    Dispense:  70 tablet    Refill:  0  . fluconazole (DIFLUCAN) 150 MG tablet    Sig: Take 2 tablets by mouth once weekly for 2 weeks.    Dispense:  4 tablet    Refill:  0  . Selenium Sulfide 2.3 % SHAM    Sig: Apply 1 application topically daily for 7 days. Leave on skin for 10 minutes, then rinse.    Dispense:  180 mL    Refill:  0    Face-to-face time spent with patient was 30 minutes. Greater than 50% of time was spent in counseling and coordination of care.  Follow-Up Instructions: Return in about 4 weeks (around 05/19/2018) for Rheumatoid arthritis.   Bo Merino, MD  Note - This record has been created using Editor, commissioning.  Chart creation errors have been sought, but may not always  have been located. Such creation errors do not reflect on  the standard of medical care.

## 2018-04-21 NOTE — Progress Notes (Signed)
Pharmacy Counseling Note  Counseled patient of proper use, storage,  and dosing of injectable methotrexate, Rasuvo.  Patient's regular methotrexate dosing schedule is on Saturday but he will be out of town.  Was given sample in office to use this Saturday along with co-pay card.  Patient was also counseled on appropriate use of other prescription medications Diflucan and selenium shampoo.  All questions thank encouraged and answered.  Patient instructed to call with any questions.  Rasuvo will require a prior authorization in the office we will keep him updated.  Mariella Saa, PharmD, [facility] Rheumatology Clinical Pharmacist  04/21/2018 3:27 PM

## 2018-04-23 ENCOUNTER — Telehealth: Payer: Self-pay | Admitting: Pharmacist

## 2018-04-23 ENCOUNTER — Telehealth: Payer: Self-pay | Admitting: Pharmacy Technician

## 2018-04-23 NOTE — Telephone Encounter (Signed)
Received a fax regarding Prior Authorization for Rasuvo. Authorization has been DENIED because plan requires patient to try and fail Otrexup.  Will send document to scan center.  Phone# [phone]  12:24 PM Beatriz Chancellor, CPhT

## 2018-04-23 NOTE — Telephone Encounter (Signed)
Authorization for Otrexup 20mg  has been submitted to patient's insurance via Cover My Meds. Will update once we receive a response.  Received a notification from [facility] regarding a prior authorization for Benjamin Fields. Authorization has been APPROVED from 04/23/18 to 04/21/21.   Will send document to scan center, once received.  Please send prescription to pharmacy.  Authorization # [id number] Phone # [phone]  12:32 PM Beatriz Chancellor, CPhT

## 2018-04-23 NOTE — Telephone Encounter (Signed)
Called patient and was unavailable to reach due to traveling.  Left voicemail to call back so I can notify him of the change.  Current RX should be able to be filled as Rasuvo or Otrexup.

## 2018-04-23 NOTE — Telephone Encounter (Signed)
Patient returned my call and informed him of insurance approval for Otrexup instead of Rasuvo.  Explained that it was the same product but different device.  He asked if he should still take the sample we gave in office.  Instructed patient to use sample as it is the same product just a different brand name. Encouraged patient to call with any questions about administration.

## 2018-04-28 ENCOUNTER — Telehealth: Payer: Self-pay | Admitting: Pharmacy Technician

## 2018-04-28 NOTE — Telephone Encounter (Signed)
Received a Prior Authorization request from [employer] for Enbrel Mini. Authorization has been submitted to patient's insurance via Cover My Meds. Will update once we receive a response.  8:49 AM Beatriz Chancellor, CPhT

## 2018-04-30 ENCOUNTER — Encounter: Payer: Self-pay | Admitting: Rheumatology

## 2018-04-30 NOTE — Telephone Encounter (Signed)
Enbrel Mini has been approved. Patient has follow up appointment at the end of September.  Sent document to scan center  11:23 AM Beatriz Chancellor, CPhT

## 2018-04-30 NOTE — Telephone Encounter (Signed)
Received notification via Cover My Meds regarding a prior authorization for Enbrel. Authorization has been APPROVED from 04/28/18 to 08/25/2038.   Will send document to scan center once received.  Authorization # [id number] Phone # [phone]

## 2018-05-01 ENCOUNTER — Ambulatory Visit: Payer: BLUE CROSS/BLUE SHIELD | Admitting: Rheumatology

## 2018-05-01 ENCOUNTER — Telehealth: Payer: Self-pay | Admitting: Pharmacy Technician

## 2018-05-01 ENCOUNTER — Telehealth: Payer: Self-pay | Admitting: Rheumatology

## 2018-05-01 MED ORDER — METHOTREXATE (PF) 20 MG/0.4ML ~~LOC~~ SOAJ: 20.0000 mg | SUBCUTANEOUS | 0 refills | Status: DC

## 2018-05-01 NOTE — Telephone Encounter (Signed)
[employer], Spoke to [place], she stated that there was a Emergency planning/management officer in their billing system that let Rasuvo go through. Only PA on file is for Otrexup. Patient has already picked up Rasuvo and has copay card to take to pharmacy to see if they can rebill and refund him. Patient says he only filled 1 month supply. For next refill, please send in with note saying to bill for Otrexup.  Provided patient with copay card info for [facility] for future refills.  BIN- [id number] ID- [id number] Group- [id number]  Ref#[id number] Phone# [phone]  3:10 PM Beatriz Chancellor, CPhT

## 2018-05-01 NOTE — Addendum Note (Signed)
Addended by: Carole Binning on: 05/01/2018 03:36 PM   Modules accepted: Orders

## 2018-05-01 NOTE — Telephone Encounter (Signed)
[facility] does not carry MTX prefill auto injector.

## 2018-05-04 NOTE — Telephone Encounter (Signed)
Called [facility] to verify they do not carry auto-injector MTX per note below. Spoke to April Rph, who said they don't have it in stock, but it can be ordered. Not sure who called office and left that message. They did not receive Otrexup prescription, so Wayne Sever gave verbal for #4- Otrexup 20mg  pens plus 1 refill.  Phone- [phone]  2:33 PM Beatriz Chancellor, CPhT

## 2018-05-07 ENCOUNTER — Encounter: Payer: Self-pay | Admitting: Rheumatology

## 2018-05-07 NOTE — Progress Notes (Signed)
Office Visit Note  Patient: Benjamin Fields             Date of Birth: 22-Jul-1961           MRN: [id number]             PCP: Lennie Odor, PA-C Referring: Lennie Odor, PA-C Visit Date: 05/19/2018 Occupation: @GUAROCC @  Subjective:  Medication management   History of Present Illness: Benjamin Fields is a 57 y.o. male with history of seronegative rheumatoid arthritis and degenerative disc disease.  He was a started on subcu methotrexate along with prednisone taper.  He has reduced prednisone to 5 mg p.o. daily now.  He has been taking methotrexate injections subcutaneously in the abdominal region.  He states last week he tried it in his left thigh.  He noticed some weakness in his right thigh quad muscle for about 3 hours which resolved.  He continues to have some generalized fatigue.  He has had no recurrence of left knee joint effusion.  Overall his joints are feeling better.  His right shoulder joint is better.  He was also given Diflucan last time for tinea versicolor.  The rash is resolved now.  Continues to have discomfort in his right shoulder and difficulty raising his arm.  Activities of Daily Living:  Patient reports morning stiffness for 1 hour.   Patient Denies nocturnal pain.  Difficulty dressing/grooming: Denies Difficulty climbing stairs: Denies Difficulty getting out of chair: Denies Difficulty using hands for taps, buttons, cutlery, and/or writing: Denies  Review of Systems  Constitutional: Positive for fatigue. Negative for night sweats.  HENT: Negative for mouth sores, mouth dryness and nose dryness.   Eyes: Positive for discharge. Negative for redness and dryness.  Respiratory: Negative for shortness of breath and difficulty breathing.   Cardiovascular: Negative for chest pain, palpitations, hypertension, irregular heartbeat and swelling in legs/feet.  Gastrointestinal: Negative for constipation and diarrhea.  Endocrine: Negative for increased urination.    Genitourinary: Negative for difficulty urinating.  Musculoskeletal: Positive for morning stiffness. Negative for arthralgias, joint pain, joint swelling, myalgias, muscle weakness, muscle tenderness and myalgias.  Skin: Negative for color change, rash, hair loss, nodules/bumps, skin tightness, ulcers and sensitivity to sunlight.  Allergic/Immunologic: Negative for susceptible to infections.  Neurological: Negative for dizziness, fainting, memory loss, night sweats and weakness.  Hematological: Negative for bruising/bleeding tendency and swollen glands.  Psychiatric/Behavioral: Negative for depressed mood and sleep disturbance. The patient is not nervous/anxious.     PMFS History:  Patient Active Problem List   Diagnosis Date Noted  . Rheumatoid arthritis of multiple sites with negative rheumatoid factor ([place]) 05/19/2018  . Tinea versicolor 05/19/2018  . Essential hypertension 03/17/2018  . DDD (degenerative disc disease), cervical 03/16/2018    Past Medical History:  Diagnosis Date  . Hypertension   . RA (rheumatoid arthritis) (HCC)     Family History  Problem Relation Age of Onset  . Parkinson's disease Mother   . Heart failure Father   . Hypertension Father    Past Surgical History:  Procedure Laterality Date  . CERVICAL SPINE SURGERY    . TONSILLECTOMY     Social History   Social History Narrative  . Not on file    Objective: Vital Signs: BP 135/67 (BP Location: Left Arm, Patient Position: Sitting, Cuff Size: Normal)   Pulse 86   Resp 14   Ht 5\' 10"  (1.778 m)   Wt 191 lb 6.4 oz (86.8 kg)   BMI 27.46 kg/m  Physical Exam  Constitutional: He is oriented to person, place, and time. He appears well-developed and well-nourished.  HENT:  Head: Normocephalic and atraumatic.  Eyes: Pupils are equal, round, and reactive to light. Conjunctivae and EOM are normal.  Neck: Normal range of motion. Neck supple.  Cardiovascular: Normal rate, regular rhythm and normal heart  sounds.  Pulmonary/Chest: Effort normal and breath sounds normal.  Abdominal: Soft. Bowel sounds are normal.  Neurological: He is alert and oriented to person, place, and time.  Skin: Skin is warm and dry. Capillary refill takes less than 2 seconds.  Psychiatric: He has a normal mood and affect. His behavior is normal.  Nursing note and vitals reviewed.    Musculoskeletal Exam: He has limited range of motion of his cervical spine due to fusion.  He has right shoulder joint abduction limited to 100 degrees which was painful.  The effusion has resolved.  Elbow joints wrist joint MCPs PIPs DIPs been good range of motion with no synovitis.  Hip joints knee joints ankles MTPs PIPs were in good range of motion with no synovitis.  CDAI Exam: CDAI Score: 1.8  Patient Global Assessment: 4 (mm); Provider Global Assessment: 4 (mm) Swollen: 0 ; Tender: 1  Joint Exam      Right  Left  Glenohumeral   Tender        Investigation: No additional findings.  Imaging: No results found.  Recent Labs: Lab Results  Component Value Date   WBC 8.9 05/18/2018   HGB 15.1 05/18/2018   PLT 235 05/18/2018   NA 140 05/18/2018   K 4.5 05/18/2018   CL 104 05/18/2018   CO2 29 05/18/2018   GLUCOSE 96 05/18/2018   BUN 17 05/18/2018   CREATININE 1.11 05/18/2018   BILITOT 0.6 05/18/2018   AST 21 05/18/2018   ALT 36 05/18/2018   PROT 6.3 05/18/2018   CALCIUM 10.2 05/18/2018   GFRAA 84 05/18/2018   QFTBGOLDPLUS NEGATIVE 03/17/2018    Speciality Comments: No specialty comments available.  Procedures:  Large Joint Inj: R glenohumeral on 05/19/2018 2:59 PM Indications: pain Details: 27 G 1.5 in needle, posterior approach  Arthrogram: No  Medications: 1 mL lidocaine 1 %; 40 mg triamcinolone acetonide 40 MG/ML Aspirate: 0 mL Outcome: tolerated well, no immediate complications Procedure, treatment alternatives, risks and benefits explained, specific risks discussed. Consent was given by the patient.  Immediately prior to procedure a time out was called to verify the correct patient, procedure, equipment, support staff and site/side marked as required. Patient was prepped and draped in the usual sterile fashion.     Allergies: Cortisone   Assessment / Plan:     Visit Diagnoses: Rheumatoid arthritis of multiple sites with negative rheumatoid factor (HCC) - RF-, CCP-, 14-3-3 eta negative.  Patient had no synovitis on examination.  He still have pain and discomfort in his right shoulder and limited range of motion.  High risk medication use - MTX 20 mg sq q wk, folic acid 2 mg po qd.  He has been tolerating methotrexate well.  He had an episode of right muscle weakness lasting for 3 hours which resolved.  I do not think that this episode was related to the methotrexate injection.  It was in his other extremity.  Other fatigue-he has chronic history of fatigue.  Effusion of left knee-resolved.  Chronic right shoulder pain-he still has limited range of motion of his right shoulder and discomfort with range of motion.  After informed consent was obtained and  different treatment options were discussed right shoulder joint was injected with cortisone as described above.  He tolerated the procedure well.  Effusion of right shoulder-resolved.  DDD (degenerative disc disease), cervical - S/p fusion.  Tinea versicolor-the rash has resolved with Diflucan.  Essential hypertension -his systolic blood pressure was mildly elevated today.  Have advised him to monitor his blood pressure closely.  Orders: Orders Placed This Encounter  Procedures  . Large Joint Inj   No orders of the defined types were placed in this encounter.   Face-to-face time spent with patient was 30 minutes. Greater than 50% of time was spent in counseling and coordination of care.  Follow-Up Instructions: Return in about 3 months (around 08/18/2018) for Rheumatoid arthritis, DDD.   Bo Merino, MD  Note - This  record has been created using Editor, commissioning.  Chart creation errors have been sought, but may not always  have been located. Such creation errors do not reflect on  the standard of medical care.

## 2018-05-08 ENCOUNTER — Ambulatory Visit (HOSPITAL_COMMUNITY)
Admission: EM | Admit: 2018-05-08 | Discharge: 2018-05-08 | Disposition: A | Discharge status: Home / Self Care | Payer: BLUE CROSS/BLUE SHIELD | Attending: Emergency Medicine | Admitting: Emergency Medicine

## 2018-05-08 ENCOUNTER — Encounter (HOSPITAL_COMMUNITY): Payer: Self-pay | Admitting: Emergency Medicine

## 2018-05-08 DIAGNOSIS — H1013 Acute atopic conjunctivitis, bilateral: Secondary | ICD-10-CM

## 2018-05-08 HISTORY — DX: Rheumatoid arthritis, unspecified: M06.9

## 2018-05-08 MED ORDER — OLOPATADINE HCL 0.1 % OP SOLN: 1.0000 [drp] | Freq: Two times a day (BID) | OPHTHALMIC | 12 refills | Status: AC

## 2018-05-08 NOTE — Discharge Instructions (Addendum)
Your eyes look well today, I do not see any indication of infection.  We will try drops to help with allergic symptoms which I feel will help with your symptoms.  If you develop any worsening of redness, pain, fevers, vision changes, thick drainage or otherwise worsening please return to be seen or go to the Er.  If symptoms persist without improvement please follow up with your eye doctor.

## 2018-05-08 NOTE — ED Provider Notes (Signed)
[facility]    CSN: [id number] Arrival date & time: 05/08/18  1019     History   Chief Complaint Chief Complaint  Patient presents with  . Eye Pain    HPI Benjamin Fields is a 57 y.o. male.   Benjamin Fields presents with complaints of bilateral eyes with burning and itching sensation which started a few days ago. Left eye feels a bit worse than right. States this morning noted slight redness. Has had tearing to the eye without any mattering or exudate. No vision change, no swelling to lids or eyes, no eye ball pain. No injury, no exposure to eyes. Wears glasses, does not wear contacts. Denies any previous similar. Denies cough, congestion, ear pain, sore throat.  He is on methotrexate and prednisone due to RA. Hx of htn.     ROS per HPI.      Past Medical History:  Diagnosis Date  . Hypertension   . RA (rheumatoid arthritis) [facility])     Patient Active Problem List   Diagnosis Date Noted  . Essential hypertension 03/17/2018  . DDD (degenerative disc disease), cervical 03/16/2018    Past Surgical History:  Procedure Laterality Date  . CERVICAL SPINE SURGERY    . TONSILLECTOMY         Home Medications    Prior to Admission medications   Medication Sig Start Date End Date Taking? Authorizing Provider  fluconazole (DIFLUCAN) 150 MG tablet Take 2 tablets by mouth once weekly for 2 weeks. 04/21/18   Bo Merino, MD  folic acid (FOLVITE) 1 MG tablet Take 2 tablets (2 mg total) by mouth daily. 03/26/18   Bo Merino, MD  losartan (COZAAR) 25 MG tablet Take 25 mg by mouth daily. for high blood pressure 03/11/18   [provider]  methotrexate (RHEUMATREX) 2.5 MG tablet Take 6 tabs by mouth once weekly for 2 weeks, if labs are stable increase to 8 tabs once weekly. Caution:Chemotherapy. Protect from light. 03/26/18   Bo Merino, MD  Methotrexate, PF, 20 MG/0.4ML SOAJ Inject 20 mg into the skin once a week. 04/21/18   Bo Merino, MD    Methotrexate, PF, 20 MG/0.4ML SOAJ Inject 20 mg into the skin once a week. 05/01/18   Bo Merino, MD  olopatadine (PATANOL) 0.1 % ophthalmic solution Place 1 drop into both eyes 2 (two) times daily for 14 days. 05/08/18 05/22/18  Zigmund Gottron, NP  predniSONE (DELTASONE) 5 MG tablet Take 2 tablets by mouth daily x2 weeks, 1.5 tablets x2 weeks, 1 tablet x2 weeks, 1/2 tablet x2 weeks. 04/21/18   Bo Merino, MD  traMADol (ULTRAM) 50 MG tablet TAKE 2 TABLETS BY MOUTH EVERY 6 HOURS AS NEEDED FOR  UP  TO  7  DAYS 03/17/18   Jessy Oto, MD    Family History Family History  Problem Relation Age of Onset  . Parkinson's disease Mother   . Heart failure Father   . Hypertension Father     Social History Social History   Tobacco Use  . Smoking status: Never Smoker  . Smokeless tobacco: Never Used  Substance Use Topics  . Alcohol use: Never    Frequency: Never  . Drug use: Never     Allergies   Cortisone   Review of Systems Review of Systems   Physical Exam Triage Vital Signs ED Triage Vitals [05/08/18 1049]  Enc Vitals Group     BP (!) 150/89     Pulse Rate 87  Resp 18     Temp 98.2 F (36.8 C)     Temp Source Oral     SpO2 98 %     Weight      Height      Head Circumference      Peak Flow      Pain Score      Pain Loc      Pain Edu?      Excl. in [place]?    No data found.  Updated Vital Signs BP (!) 150/89 (BP Location: Right Arm)   Pulse 87   Temp 98.2 F (36.8 C) (Oral)   Resp 18   SpO2 98%     Visual Acuity  Right Eye Distance:   Left Eye Distance:   Bilateral Distance:    Right Eye Near: R Near: 20/20 Left Eye Near:  L Near: 20/50 Bilateral Near:      Physical Exam  Constitutional: He is oriented to person, place, and time. He appears well-developed and well-nourished.  HENT:  Right Ear: External ear normal.  Left Ear: External ear normal.  Nose: Nose normal.  Mouth/Throat: Oropharynx is clear and moist.  Eyes: Pupils are  equal, round, and reactive to light. Conjunctivae, EOM and lids are normal. Right eye exhibits no discharge, no exudate and no hordeolum. Left eye exhibits no discharge, no exudate and no hordeolum.  No redness, drainage, eye pain or acute findings on gross exam; fluorescein deferred at this time   Cardiovascular: Normal rate and regular rhythm.  Pulmonary/Chest: Effort normal and breath sounds normal.  Neurological: He is alert and oriented to person, place, and time.  Skin: Skin is warm and dry.     UC Treatments / Results  Labs (all labs ordered are listed, but only abnormal results are displayed) Labs Reviewed - No data to display  EKG None  Radiology No results found.  Procedures Procedures (including critical care time)  Medications Ordered in UC Medications - No data to display  Initial Impression / Assessment and Plan / UC Course  I have reviewed the triage vital signs and the nursing notes.  Pertinent labs & imaging results that were available during my care of the patient were reviewed by me and considered in my medical decision making (see chart for details).     History physical and exam consistent with an allergic conjunctivitis. Benign physical exam. No red flag findings. patanol provided for comfort. Return precautions provided. Patient verbalized understanding and agreeable to plan.    Final Clinical Impressions(s) / UC Diagnoses   Final diagnoses:  Allergic conjunctivitis of both eyes     Discharge Instructions     Your eyes look well today, I do not see any indication of infection.  We will try drops to help with allergic symptoms which I feel will help with your symptoms.  If you develop any worsening of redness, pain, fevers, vision changes, thick drainage or otherwise worsening please return to be seen or go to the Er.  If symptoms persist without improvement please follow up with your eye doctor.    ED Prescriptions    Medication Sig Dispense  Auth. Provider   olopatadine (PATANOL) 0.1 % ophthalmic solution Place 1 drop into both eyes 2 (two) times daily for 14 days. 5 mL Augusto Gamble B, NP     Controlled Substance Prescriptions [place] Controlled Substance Registry consulted? Not Applicable   Zigmund Gottron, NP 05/08/18 1147

## 2018-05-08 NOTE — ED Triage Notes (Signed)
Pt here for left eye irritation x 2 days

## 2018-05-16 ENCOUNTER — Encounter: Payer: Self-pay | Admitting: Rheumatology

## 2018-05-18 ENCOUNTER — Other Ambulatory Visit: Payer: Self-pay

## 2018-05-18 DIAGNOSIS — Z79899 Other long term (current) drug therapy: Secondary | ICD-10-CM | POA: Diagnosis not present

## 2018-05-18 NOTE — Telephone Encounter (Signed)
Attempted to contact patient and left message for patient to call the office.  

## 2018-05-18 NOTE — Telephone Encounter (Signed)
Patient states he took his Otrexup injection in left thigh on Saturday as scheduled. Patient states that he developed weakness in the right leg for several hours on Saturday. He states he was unable to walk for on it.  Patient states that has now subsided. Patient does have a follow up appointment on 05/19/18.

## 2018-05-18 NOTE — Telephone Encounter (Signed)
Findings of benefits investigation via test claims:   Insurance: [employer] [place]  Copay: $200  Patient eligible to sign up for a copay card through [employer].  Phone# [phone]  11:40 AM Beatriz Chancellor, CPhT

## 2018-05-18 NOTE — Telephone Encounter (Signed)
Entry is spoke with patient and he will come in the office tomorrow to be evaluated.

## 2018-05-19 ENCOUNTER — Ambulatory Visit: Payer: BLUE CROSS/BLUE SHIELD | Admitting: Rheumatology

## 2018-05-19 ENCOUNTER — Encounter: Payer: Self-pay | Admitting: Rheumatology

## 2018-05-19 VITALS — BP 135/67 | HR 86 | Resp 14 | Ht 70.0 in | Wt 191.4 lb

## 2018-05-19 DIAGNOSIS — B360 Pityriasis versicolor: Secondary | ICD-10-CM

## 2018-05-19 DIAGNOSIS — M25511 Pain in right shoulder: Secondary | ICD-10-CM

## 2018-05-19 DIAGNOSIS — Z79899 Other long term (current) drug therapy: Secondary | ICD-10-CM | POA: Diagnosis not present

## 2018-05-19 DIAGNOSIS — M5030 Other cervical disc degeneration, unspecified cervical region: Secondary | ICD-10-CM | POA: Diagnosis not present

## 2018-05-19 DIAGNOSIS — G8929 Other chronic pain: Secondary | ICD-10-CM

## 2018-05-19 DIAGNOSIS — I10 Essential (primary) hypertension: Secondary | ICD-10-CM

## 2018-05-19 DIAGNOSIS — R5383 Other fatigue: Secondary | ICD-10-CM | POA: Diagnosis not present

## 2018-05-19 DIAGNOSIS — M0609 Rheumatoid arthritis without rheumatoid factor, multiple sites: Secondary | ICD-10-CM

## 2018-05-19 HISTORY — DX: Pityriasis versicolor: B36.0

## 2018-05-19 LAB — COMPLETE METABOLIC PANEL WITH GFR
AG RATIO: 2 (calc) (ref 1.0–2.5)
ALKALINE PHOSPHATASE (APISO): 57 U/L (ref 40–115)
ALT: 36 U/L (ref 9–46)
AST: 21 U/L (ref 10–35)
Albumin: 4.2 g/dL (ref 3.6–5.1)
BUN: 17 mg/dL (ref 7–25)
CHLORIDE: 104 mmol/L (ref 98–110)
CO2: 29 mmol/L (ref 20–32)
Calcium: 10.2 mg/dL (ref 8.6–10.3)
Creat: 1.11 mg/dL (ref 0.70–1.33)
GFR, Est African American: 84 mL/min/{1.73_m2} (ref >=60)
GFR, Est Non African American: 72 mL/min/{1.73_m2} (ref >=60)
Globulin: 2.1 g/dL (calc) (ref 1.9–3.7)
Glucose, Bld: 96 mg/dL (ref 65–99)
POTASSIUM: 4.5 mmol/L (ref 3.5–5.3)
Sodium: 140 mmol/L (ref 135–146)
Total Bilirubin: 0.6 mg/dL (ref 0.2–1.2)
Total Protein: 6.3 g/dL (ref 6.1–8.1)

## 2018-05-19 LAB — CBC WITH DIFFERENTIAL/PLATELET
BASOS PCT: 0.9 %
Basophils Absolute: 80 cells/uL (ref 0–200)
EOS ABS: 71 {cells}/uL (ref 15–500)
EOS PCT: 0.8 %
HCT: 43.6 % (ref 38.5–50.0)
HEMOGLOBIN: 15.1 g/dL (ref 13.2–17.1)
Lymphs Abs: 1887 cells/uL (ref 850–3900)
MCH: 31.9 pg (ref 27.0–33.0)
MCHC: 34.6 g/dL (ref 32.0–36.0)
MCV: 92 fL (ref 80.0–100.0)
MONOS PCT: 6.1 %
MPV: 10 fL (ref 7.5–12.5)
NEUTROS ABS: 6319 {cells}/uL (ref 1500–7800)
Neutrophils Relative %: 71 %
PLATELETS: 235 10*3/uL (ref 140–400)
RBC: 4.74 10*6/uL (ref 4.20–5.80)
RDW: 16.6 % — ABNORMAL HIGH (ref 11.0–15.0)
TOTAL LYMPHOCYTE: 21.2 %
WBC mixed population: 543 cells/uL (ref 200–950)
WBC: 8.9 10*3/uL (ref 3.8–10.8)

## 2018-05-19 MED ORDER — TRIAMCINOLONE ACETONIDE 40 MG/ML IJ SUSP
40.0000 mg | INTRAMUSCULAR | Status: AC | PRN
  Administered 2018-05-19: 40 mg via INTRA_ARTICULAR

## 2018-05-19 MED ORDER — LIDOCAINE HCL 1 % IJ SOLN
1.0000 mL | INTRAMUSCULAR | Status: AC | PRN
  Administered 2018-05-19: 1 mL

## 2018-05-19 NOTE — Patient Instructions (Addendum)
Standing Labs We placed an order today for your standing lab work.    Please come back and get your standing labs in December and every 3 months.  We have open lab Monday through Friday from 8:30-11:30 AM and 1:30-4:00 PM  at the office of Dr. Bo Merino.   You may experience shorter wait times on Monday and Friday afternoons. The office is located at [address] [place], [place], [place] [address] No appointment is necessary.   Labs are drawn by [facility].  You may receive a bill from [facility] [facility] for your lab work. If you have any questions regarding directions or hours of operation,  please call [phone].    Vaccines You are taking a medication(s) that can suppress your immune system.  The following immunizations are recommended: . Flu annually . Pneumonia . Shingrix . Hepatitis B  Please check with your PCP to make sure you are up to date.

## 2018-05-26 DIAGNOSIS — Z23 Encounter for immunization: Secondary | ICD-10-CM | POA: Diagnosis not present

## 2018-05-26 DIAGNOSIS — I10 Essential (primary) hypertension: Secondary | ICD-10-CM | POA: Diagnosis not present

## 2018-05-26 DIAGNOSIS — R7309 Other abnormal glucose: Secondary | ICD-10-CM | POA: Diagnosis not present

## 2018-05-26 DIAGNOSIS — M069 Rheumatoid arthritis, unspecified: Secondary | ICD-10-CM | POA: Diagnosis not present

## 2018-05-28 ENCOUNTER — Ambulatory Visit: Payer: BLUE CROSS/BLUE SHIELD | Admitting: Physician Assistant

## 2018-06-03 ENCOUNTER — Encounter: Payer: Self-pay | Admitting: Rheumatology

## 2018-06-05 ENCOUNTER — Telehealth: Payer: Self-pay | Admitting: Rheumatology

## 2018-06-05 ENCOUNTER — Encounter: Payer: Self-pay | Admitting: Rheumatology

## 2018-06-05 MED ORDER — DICLOFENAC SODIUM 1 % TD GEL: TRANSDERMAL | 3 refills | Status: DC

## 2018-06-05 NOTE — Telephone Encounter (Signed)
Please offer a prescription for voltaren gel which he can apply topically 3 times daily.  He had a cortisone injection on 03/17/18.  If his pain and swelling persists, he can return for an injection 3 months after the previous injection.  If he noticed increased joint pain and joint swelling in other joints, he may need to return for a sooner office visit to discuss more aggressive therapy.

## 2018-06-05 NOTE — Telephone Encounter (Signed)
Patient left a voicemail stating she was returning your call.   

## 2018-06-05 NOTE — Telephone Encounter (Signed)
Attempted to contact patient and left message for patient to call the office.  

## 2018-06-05 NOTE — Telephone Encounter (Signed)
Per Julieta Gutting, PA-C Please offer a prescription for voltaren gel which he can apply topically 3 times daily. He had a cortisone injection on 03/17/18. If his pain and swelling persists, he can return for an injection 3 months after the previous injection. If he noticed increased joint pain and joint swelling in other joints, he may need to return for a sooner office visit to discuss more aggressive therapy.   Patient accepted prescription for Voltaren gel. Prescription sent to the pharmacy. Patient will call next week to let us know how he is doing and if he needs to schedule a sooner appointment.

## 2018-06-08 ENCOUNTER — Telehealth: Payer: Self-pay | Admitting: *Deleted

## 2018-06-08 NOTE — Telephone Encounter (Signed)
Prior Authorization submitted via cover my meds for Voltaren Gel. Will update with response.

## 2018-06-09 NOTE — Telephone Encounter (Signed)
Prior Authorization for Voltaren gel denied. Left message to advise patient and advise he can use goodrx.com for discount coupon.

## 2018-06-17 ENCOUNTER — Encounter: Payer: Self-pay | Admitting: Rheumatology

## 2018-06-18 NOTE — Telephone Encounter (Signed)
Patient has had some recent stomach upset and wanted to know if Voltaren gel could be the cause.  He has taken diclofenac oral tablets and other NSAIDs which caused him the same type of GI trouble.  Informed patient that the Voltaren gel bypasses the stomach, as you are applying it directly to the skin and should not cause any stomach upset.  Patient states he mainly uses it twice a day and finds that it does offer some relief.  States that he has noticed an increase in pain since decreasing the prednisone but is much less severe prior to initiating methotrexate.  Will reassess medication regimen at next office visit.  All questions encouraged and answered.

## 2018-06-18 NOTE — Telephone Encounter (Signed)
Called patient to discuss Voltaren gel and side effects.  Left voicemail on mobile phone.

## 2018-06-27 ENCOUNTER — Encounter: Payer: Self-pay | Admitting: Rheumatology

## 2018-07-04 ENCOUNTER — Encounter: Payer: Self-pay | Admitting: Rheumatology

## 2018-07-06 NOTE — Telephone Encounter (Signed)
Prior authorization for Enbrel: Authorization has been APPROVED from 04/28/18 to 08/25/2038.  Patient has $200 copay for 30 day supply. Patient is copay card eligible.

## 2018-07-06 NOTE — Telephone Encounter (Signed)
I could not reach patient.  I left message on the answering machine for him to call back.  They should be no difference between the oral methotrexate and subcu methotrexate.  If he prefers oral methotrexate we can call in methotrexate 4 tablets p.o. twice a week that way he will not have to swallow it pills together.  It would be his choice.  If he calls please ask how is he clinically doing.  If he wants to change therapy we will have to schedule an appointment.

## 2018-07-06 NOTE — Progress Notes (Signed)
Office Visit Note  Patient: Benjamin Fields             Date of Birth: [birth year]           MRN: [id number]             PCP: Lennie Odor, PA-C Referring: Lennie Odor, PA-C Visit Date: 07/10/2018 Occupation: @GUAROCC @  Subjective:  Pain in multiple joints   History of Present Illness: Benjamin Fields is a 58 y.o. male with history of seronegative rheumatoid arthritis and degenerative disc disease.  He is on Otrexup 20 mg sq once weekly and folic acid 2 mg po daily.  He has not missed any doses recently.  He has been off of prednisone for about 2 weeks.  He continues to have pain in multiple joints.  He states he has pain and swelling in bilateral hands worse in the morning.  He states he continues to have severe pain in his right shoulder.  He describes the pain is constant.  He states the pain is most severe with range of motion.  He denies any weakness or numbness in his right arm.  He states that at night his right arm will be cold intermittently when he is sleeping.  He states the pain often wakes him up at night.  He states that his last right shoulder cortisone injection in September did not provide any relief.  He reports that his left knee is doing okay.  He states that his left thigh gets sore after injecting methotrexate weekly.  He is having left groin pain.    Activities of Daily Living:  Patient reports morning stiffness for 1-2  hours.   Patient Reports nocturnal pain.  Difficulty dressing/grooming: Denies Difficulty climbing stairs: Denies Difficulty getting out of chair: Reports Difficulty using hands for taps, buttons, cutlery, and/or writing: Reports  Review of Systems  Constitutional: Positive for fatigue. Negative for night sweats.  HENT: Negative for mouth sores, trouble swallowing, trouble swallowing, mouth dryness and nose dryness.   Eyes: Positive for dryness. Negative for redness and visual disturbance.  Respiratory: Negative for cough, hemoptysis, shortness  of breath and difficulty breathing.   Cardiovascular: Negative for chest pain, palpitations, hypertension, irregular heartbeat and swelling in legs/feet.  Gastrointestinal: Negative for blood in stool, constipation and diarrhea.  Endocrine: Negative for increased urination.  Genitourinary: Negative for painful urination.  Musculoskeletal: Positive for arthralgias, joint pain, joint swelling and morning stiffness. Negative for myalgias, muscle weakness, muscle tenderness and myalgias.  Skin: Negative for color change, rash, hair loss, nodules/bumps, skin tightness, ulcers and sensitivity to sunlight.  Allergic/Immunologic: Negative for susceptible to infections.  Neurological: Positive for headaches. Negative for dizziness, fainting, memory loss, night sweats and weakness.  Hematological: Negative for swollen glands.  Psychiatric/Behavioral: Negative for depressed mood and sleep disturbance. The patient is not nervous/anxious.     PMFS History:  Patient Active Problem List   Diagnosis Date Noted  . Rheumatoid arthritis of multiple sites with negative rheumatoid factor ([place]) 05/19/2018  . Tinea versicolor 05/19/2018  . Essential hypertension 03/17/2018  . DDD (degenerative disc disease), cervical 03/16/2018    Past Medical History:  Diagnosis Date  . Hypertension   . RA (rheumatoid arthritis) (HCC)     Family History  Problem Relation Age of Onset  . Parkinson's disease Mother   . Heart failure Father   . Hypertension Father    Past Surgical History:  Procedure Laterality Date  . CERVICAL SPINE SURGERY    .  TONSILLECTOMY     Social History   Social History Narrative  . Not on file    Objective: Vital Signs: BP (!) 143/85 (BP Location: Left Arm, Patient Position: Sitting, Cuff Size: Normal)   Pulse 75   Resp 15   Ht 5\' 10"  (1.778 m)   Wt 193 lb (87.5 kg)   BMI 27.69 kg/m    Physical Exam  Constitutional: He is oriented to person, place, and time. He appears  well-developed and well-nourished.  HENT:  Head: Normocephalic and atraumatic.  Eyes: Pupils are equal, round, and reactive to light. Conjunctivae and EOM are normal.  Neck: Normal range of motion. Neck supple.  Cardiovascular: Normal rate, regular rhythm and normal heart sounds.  Pulmonary/Chest: Effort normal and breath sounds normal.  Abdominal: Soft. Bowel sounds are normal.  Lymphadenopathy:    He has no cervical adenopathy.  Neurological: He is alert and oriented to person, place, and time.  Skin: Skin is warm and dry. Capillary refill takes less than 2 seconds.  Psychiatric: He has a normal mood and affect. His behavior is normal.  Nursing note and vitals reviewed.    Musculoskeletal Exam: C-spine, thoracic spine, and lumbar spine good ROM.  Right shoulder limited ROM with discomfort.  Left shoulder full ROM.  Elbow joints, wrist joints, MCPs, PIPs, and DIPs good ROM with no synovitis.  Left hip limited ROM.  Right hip full ROM.  knee joints, ankle joints, MTPs, PIPs, and DIPs good ROM with no synovitis.  No warmth or effusion of knee joints.  No tenderness or swelling of ankle joints.   CDAI Exam: CDAI Score: Not documented Patient Global Assessment: Not documented; Provider Global Assessment: Not documented Swollen: 0 ; Tender: 2  Joint Exam      Right  Left  Glenohumeral   Tender     Hip      Tender     Investigation: No additional findings.  Imaging: No results found.  Recent Labs: Lab Results  Component Value Date   WBC 8.9 05/18/2018   HGB 15.1 05/18/2018   PLT 235 05/18/2018   NA 140 05/18/2018   K 4.5 05/18/2018   CL 104 05/18/2018   CO2 29 05/18/2018   GLUCOSE 96 05/18/2018   BUN 17 05/18/2018   CREATININE 1.11 05/18/2018   BILITOT 0.6 05/18/2018   AST 21 05/18/2018   ALT 36 05/18/2018   PROT 6.3 05/18/2018   CALCIUM 10.2 05/18/2018   GFRAA 84 05/18/2018   QFTBGOLDPLUS NEGATIVE 03/17/2018    Speciality Comments: No specialty comments  available.  Procedures:  No procedures performed Allergies: Cortisone    Assessment / Plan:     Visit Diagnoses: Rheumatoid arthritis of multiple sites with negative rheumatoid factor (HCC) - RF-, CCP-, 14-3-3 eta negative: He has no synovitis on exam.  He continues to have pain in multiple joints including his right shoulder, bilateral hands, and left hip joint.  He is limited range of motion of the right shoulder and left hip on exam.  He is on Otrexup 20 mg subcutaneous injections once weekly and folic acid 2 mg by mouth daily.  He has not noticed much improvement since being on Otrexup.  His last prednisone taper ended 2 weeks ago.  He reports that he experiences fatigue and nausea 24 to 48 hours after his injections.  We discussed adding on Enbrel to his current treatment regimen.  The indications, contraindications, potential side effects were discussed.  All questions were addressed.  Consent was  obtained today.  His first injection was performed today in the office.  He tolerated the injection without any side effects.  He will follow-up in the office in 2 months.  He will return for lab work in 1 month and then every 3 months.  He was advised to notify us if he continues to have joint pain and joint swelling.  A prednisone taper starting at 20 mg and tapering by 5 mg every 4 days was sent to the pharmacy.  Medication counseling:   Component     Latest Ref Rng & Units 03/17/2018  Total Protein     6.1 - 8.1 g/dL 6.6  Albumin ELP     3.8 - 4.8 g/dL 3.9  Alpha 1     0.2 - 0.3 g/dL 0.4 (H)  Alpha 2     0.5 - 0.9 g/dL 1.0 (H)  Beta Globulin     0.4 - 0.6 g/dL 0.4  Beta 2     0.2 - 0.5 g/dL 0.3  Gamma Globulin     0.8 - 1.7 g/dL 0.5 (L)  Abnormal Protein Band1     NONE DETEC g/dL NOTE  SPE Interp.        QuantiFERON-TB Gold Plus     NEGATIVE NEGATIVE  NIL     IU/mL 0.03  Mitogen-NIL     IU/mL 7.36  TB1-NIL     IU/mL 0.00  TB2-NIL     IU/mL 0.00  Immunoglobulin A     47 -  310 mg/dL 93  IgG (Immunoglobin G), Serum     600 - 1,640 mg/dL 548 (L)  IgM, Serum     50 - 300 mg/dL 78  Hepatitis C Ab     NON-REACTI NON-REACTIVE  SIGNAL TO CUT-OFF     <1.00 0.01  HIV     NON-REACTI NON-REACTIVE  Hep B Core Ab, IgM     NON-REACTI NON-REACTIVE  Hepatitis B Surface Ag     NON-REACTI NON-REACTIVE    Chest x-ray: CXR 2007 no acute findings.   Does patient have diagnosis of heart failure?  No  Counseled patient that Enbrel is a TNF blocking agent.  Reviewed Enbrel dose of 50 mg once weekly.  Counseled patient on purpose, proper use, and adverse effects of Enbrel.  Reviewed the most common adverse effects including infections, headache, and injection site reactions. Discussed that there is the possibility of an increased risk of malignancy but it is not well understood if this increased risk is due to the medication or the disease state.  Advised patient to get yearly dermatology exams due to risk of skin cancer.  Reviewed the importance of regular labs while on Enbrel therapy.  Advised patient to get standing labs one month after starting Enbrel then every 2 months.  Provided patient with standing lab orders.  Counseled patient that Enbrel should be held prior to scheduled surgery.  Counseled patient to avoid live vaccines while on Enbrel.  Advised patient to get annual influenza vaccine and the pneumococcal vaccine as needed.  Provided patient with medication education material and answered all questions.  Patient voiced understanding.  Patient consented to Enbrel.  Will upload consent into the media tab.  Reviewed storage instructions for Enbrel.  Advised initial injection must be administered in office.  Patient voiced understanding.     High risk medication use - MTX 20 mg sq q wk, folic acid 2 mg po qd. he will return for lab work in 61 month and  then every 3 months.  Standing orders are in place.  Other fatigue: Chronic fatigue.  His fatigue worsens after taking  Otrexup on a weekly basis.  DDD (degenerative disc disease), cervical - S/p : He has good ROM with no discomfort.  He has no symptoms of radiculopathy at this time.   Tinea versicolor -  Resolved with Diflucan.  Other medical conditions are listed as follows:   Essential hypertension  Effusion of right shoulder - resolved.  Effusion of left knee - resolved.   Orders: No orders of the defined types were placed in this encounter.  Meds ordered this encounter  Medications  . predniSONE (DELTASONE) 5 MG tablet    Sig: Take 4 tablets by mouth x4 days, 3 tablets by mouth x4 days, 2 tablets by mouth x4days, 1 tablet by mouth x4 days.    Dispense:  40 tablet    Refill:  0    Face-to-face time spent with patient was 30 minutes. Greater than 50% of time was spent in counseling and coordination of care.  Follow-Up Instructions: Return in about 2 months (around 09/09/2018) for Rheumatoid arthritis.   Benjamin Neas, PA-C   I examined and evaluated the patient with Benjamin Sams PA.  Patient continues to have a lot of pain and discomfort in the joints.  He is having difficult time since he is tapered off prednisone.  He brought MRI of his right shoulder joint which we reviewed.  He had significant effusion and bursitis.  He did much better while he was on prednisone.  I have given him a prednisone taper.  He also had detailed discussion regarding different treatment options.  Indications side effects contraindications of Enbrel were discussed.  Which has been approved by his insurance.  After informed consent was obtained he was given first injection of Enbrel in the office and he was observed in the office for 30 minutes.  No side effects were observed.  He will do weekly injection of Enbrel now.  The plan of care was discussed as noted above.  Benjamin Merino, MD  Note - This record has been created using Editor, commissioning.  Chart creation errors have been sought, but may not always  have been  located. Such creation errors do not reflect on  the standard of medical care.

## 2018-07-10 ENCOUNTER — Telehealth: Payer: Self-pay | Admitting: Pharmacy Technician

## 2018-07-10 ENCOUNTER — Encounter: Payer: Self-pay | Admitting: Physician Assistant

## 2018-07-10 ENCOUNTER — Ambulatory Visit: Payer: BLUE CROSS/BLUE SHIELD | Admitting: Rheumatology

## 2018-07-10 VITALS — BP 139/88 | HR 70 | Resp 15 | Ht 70.0 in | Wt 193.0 lb

## 2018-07-10 DIAGNOSIS — M5030 Other cervical disc degeneration, unspecified cervical region: Secondary | ICD-10-CM | POA: Diagnosis not present

## 2018-07-10 DIAGNOSIS — I10 Essential (primary) hypertension: Secondary | ICD-10-CM

## 2018-07-10 DIAGNOSIS — Z79899 Other long term (current) drug therapy: Secondary | ICD-10-CM

## 2018-07-10 DIAGNOSIS — M0609 Rheumatoid arthritis without rheumatoid factor, multiple sites: Secondary | ICD-10-CM

## 2018-07-10 DIAGNOSIS — R5383 Other fatigue: Secondary | ICD-10-CM

## 2018-07-10 DIAGNOSIS — M25411 Effusion, right shoulder: Secondary | ICD-10-CM

## 2018-07-10 DIAGNOSIS — B360 Pityriasis versicolor: Secondary | ICD-10-CM

## 2018-07-10 DIAGNOSIS — M25462 Effusion, left knee: Secondary | ICD-10-CM

## 2018-07-10 MED ORDER — ETANERCEPT 50 MG/ML ~~LOC~~ SOCT: 50.0000 mg | SUBCUTANEOUS | 0 refills | Status: DC

## 2018-07-10 MED ORDER — ETANERCEPT 50 MG/ML ~~LOC~~ SOCT
50.0000 mg | Freq: Once | SUBCUTANEOUS | Status: AC
  Administered 2018-07-10: 50 mg via SUBCUTANEOUS

## 2018-07-10 MED ORDER — PREDNISONE 5 MG PO TABS: ORAL_TABLET | ORAL | 0 refills | Status: DC

## 2018-07-10 NOTE — Patient Instructions (Signed)
Standing Labs We placed an order today for your standing lab work.    Please come back and get your standing labs in 1 month and then every 3 months.  We have open lab Monday through Friday from 8:30-11:30 AM and 1:30-4:00 PM  at the office of Dr. Bo Merino.   You may experience shorter wait times on Monday and Friday afternoons. The office is located at [address], [place], [place], [place] [address] No appointment is necessary.   Labs are drawn by [facility].  You may receive a bill from [facility] for your lab work. If you have any questions regarding directions or hours of operation,  please call [phone].   Just as a reminder please drink plenty of water prior to coming for your lab work. Thanks!  Vaccines You are taking a medication(s) that can suppress your immune system.  The following immunizations are recommended: . Flu annually . Pneumonia (Pneumovax 21 and Prevnar 13 after age 59) . Shingrix  Please check with your PCP to make sure you are up to date.  In addition to staying up to date on lab work and vaccines it is important to:  Marland Kitchen Yearly skin checks with dermatologist due to increase risk in skin cancer with TNF inhibitors .  Helpful Tips for Injecting To help alleviate pain and injection site reactions consider the following tips:  . Placing something cold (like and ice gel pack or cold water bottle) on the injection site just before cleansing with alcohol may help reduce pain . If you have a localized reaction (redness, mild swelling, warmth, and itching) you can use topical corticosteroids (hydrocortisone cream) or antihistamine (Claritin) to help minimize reaction the day of, the day before, and the day after injecting . Always inject this medication at room temperature (remove from refrigerator 15-20 minutes before injecting; this may help eliminate stinging). . Always rotate your injection sites using inside/outside thigh of both legs, abdomen divided  into 4 quadrants (stay away from waist line, and 2" away from navel). . Do not inject into areas where skin is tender, bruised, red, or hard or where there are scars or stretch marks. . Always let alcohol dry on the skin before injecting. . Place a cold, damp towel or small ice pack on the injection site for 10 or 15 minutes every 1 to 2 hours if it hurts or is swollen.  Etanercept injection What is this medicine? ETANERCEPT (et a [employer]) is used for the treatment of rheumatoid arthritis in adults and children. The medicine is also used to treat psoriatic arthritis, ankylosing spondylitis, and psoriasis. This medicine may be used for other purposes; ask your health care provider or pharmacist if you have questions. COMMON BRAND NAME(S): Enbrel What should I tell my health care provider before I take this medicine? They need to know if you have any of these conditions: -blood disorders -cancer -congestive heart failure -diabetes -exposure to chickenpox -immune system problems -infection -multiple sclerosis -seizure disorder -tuberculosis, a positive skin test for tuberculosis or have recently been in close contact with someone who has tuberculosis -Wegener's granulomatosis -an unusual or allergic reaction to etanercept, latex, other medicines, foods, dyes, or preservatives -pregnant or trying to get pregnant -breast-feeding How should I use this medicine? The medicine is given by injection under the skin. You will be taught how to prepare and give this medicine. Use exactly as directed. Take your medicine at regular intervals. Do not take your medicine more often than directed.  It is important that you put your used needles and syringes in a special sharps container. Do not put them in a trash can. If you do not have a sharps container, call your pharmacist or healthcare provider to get one. A special MedGuide will be given to you by the pharmacist with each prescription and refill. Be  sure to read this information carefully each time. Talk to your pediatrician regarding the use of this medicine in children. While this drug may be prescribed for children as young as 59 years of age for selected conditions, precautions do apply. Overdosage: If you think you have taken too much of this medicine contact a poison control center or emergency room at once. NOTE: This medicine is only for you. Do not share this medicine with others. What if I miss a dose? If you miss a dose, contact your health care professional to find out when you should take your next dose. Do not take double or extra doses without advice. What may interact with this medicine? Do not take this medicine with any of the following medications: -anakinra This medicine may also interact with the following medications: -cyclophosphamide -sulfasalazine -vaccines This list may not describe all possible interactions. Give your health care provider a list of all the medicines, herbs, non-prescription drugs, or dietary supplements you use. Also tell them if you smoke, drink alcohol, or use illegal drugs. Some items may interact with your medicine. What should I watch for while using this medicine? Tell your doctor or healthcare professional if your symptoms do not start to get better or if they get worse. You will be tested for tuberculosis (TB) before you start this medicine. If your doctor prescribes any medicine for TB, you should start taking the TB medicine before starting this medicine. Make sure to finish the full course of TB medicine. Call your doctor or health care professional for advice if you get a fever, chills or sore throat, or other symptoms of a cold or flu. Do not treat yourself. This drug decreases your body's ability to fight infections. Try to avoid being around people who are sick. What side effects may I notice from receiving this medicine? Side effects that you should report to your doctor or health  care professional as soon as possible: -allergic reactions like skin rash, itching or hives, swelling of the face, lips, or tongue -changes in vision -fever, chills or any other sign of infection -numbness or tingling in legs or other parts of the body -red, scaly patches or raised bumps on the skin -shortness of breath or difficulty breathing -swollen lymph nodes in the neck, underarm, or groin areas -unexplained weight loss -unusual bleeding or bruising -unusual swelling or fluid retention in the legs -unusually weak or tired Side effects that usually do not require medical attention (report to your doctor or health care professional if they continue or are bothersome): -dizziness -headache -nausea -redness, itching, or swelling at the injection site -vomiting This list may not describe all possible side effects. Call your doctor for medical advice about side effects. You may report side effects to FDA at 1-800-FDA-1088. Where should I keep my medicine? Keep out of the reach of children. Store between 2 and 8 degrees C (36 and 46 degrees F). Do not freeze or shake. Protect from light. Throw away any unused medicine after the expiration date. You will be instructed on how to store this medicine. NOTE: This sheet is a summary. It may not cover all possible  information. If you have questions about this medicine, talk to your doctor, pharmacist, or health care provider.  2018 Elsevier/Gold Standard (2012-02-17 15:33:36)

## 2018-07-10 NOTE — Telephone Encounter (Signed)
Called Enbrel Support with patient and he enrolled for copay card and nurse support. Patient will receive copay card in 1-2 business days. He will call and give pharmacy the details.  10:01 AM Beatriz Chancellor, CPhT

## 2018-07-10 NOTE — Progress Notes (Signed)
Pharmacy Note  Subjective: Patient presents today to the [facility] to see Dr. Estanislado Pandy.   Patient seen by the pharmacist for counseling on Enbrel for rheumatoid arthritis.  He is currently on Otrexup 20 mg weekly and folic acid 2 mg daily which will be continued.  Objective: Quantiferon TB Gold Latest Ref Rng & Units 03/17/2018  Quantiferon TB Gold Plus NEGATIVE NEGATIVE    Hepatitis Latest Ref Rng & Units 03/17/2018  Hep B Surface Ag NON-REACTI NON-REACTIVE  Hep B IgM NON-REACTI NON-REACTIVE  Hep C Ab NON-REACTI NON-REACTIVE  Hep C Ab NON-REACTI NON-REACTIVE    Lab Results  Component Value Date   HIV NON-REACTIVE 03/17/2018    Immunoglobulin Electrophoresis Latest Ref Rng & Units 03/17/2018  IgA  47 - 310 mg/dL 93  IgG 600 - 1,640 mg/dL 548(L)  IgM 50 - 300 mg/dL 78    Serum Protein Electrophoresis Latest Ref Rng & Units 05/18/2018  Total Protein 6.1 - 8.1 g/dL 6.3  Albumin 3.8 - 4.8 g/dL -  Alpha-1 0.2 - 0.3 g/dL -  Alpha-2 0.5 - 0.9 g/dL -  Beta Globulin 0.4 - 0.6 g/dL -  Beta 2 0.2 - 0.5 g/dL -  Gamma Globulin 0.8 - 1.7 g/dL -    Lab Results  Component Value Date   G6PDH 13.9 03/17/2018    No results found for: TPMT Chest x-ray: normal on 01/07/06  CBC    Component Value Date/Time   WBC 8.9 05/18/2018 1557   RBC 4.74 05/18/2018 1557   HGB 15.1 05/18/2018 1557   HCT 43.6 05/18/2018 1557   PLT 235 05/18/2018 1557   MCV 92.0 05/18/2018 1557   MCH 31.9 05/18/2018 1557   MCHC 34.6 05/18/2018 1557   RDW 16.6 (H) 05/18/2018 1557   LYMPHSABS 1,887 05/18/2018 1557   EOSABS 71 05/18/2018 1557   BASOSABS 80 05/18/2018 1557    Does patient have diagnosis of heart failure?  Yes  Assessment/Plan:  Counseled patient that Enbrel is a TNF blocking agent.  Reviewed Enbrel dose of 50 mg once weekly.  Counseled patient on purpose, proper use, and adverse effects of Enbrel.  Reviewed the most common adverse effects including infections, headache, and  injection site reactions. Discussed that there is the possibility of an increased risk of malignancy but it is not well understood if this increased risk is due to the medication or the disease state.  Advised patient to get yearly dermatology exams due to risk of skin cancer.  Reviewed the importance of regular labs while on Enbrel therapy.  Advised patient to get standing labs one month after starting Enbrel then every 2 months.  Provided patient with standing lab orders.  Counseled patient that Enbrel should be held prior to scheduled surgery.  Counseled patient to avoid live vaccines while on Enbrel.  Advised patient to get annual influenza vaccine and the pneumococcal vaccine as needed.  Patient stated he was up to date with recommended vaccines. Provided patient with medication education material and answered all questions.  Patient voiced understanding.  Patient consented to Enbrel.  Will upload consent into the media tab.  Reviewed storage instructions for Enbrel.  Advised initial injection must be administered in office.  Patient voiced understanding.    Demonstrated proper injection technique with Enbrel mini demo device. Patient able to demonstrate proper injection technique using the teach back method.  Patient self injected in the lower right abdomen with:  Sample Medication: Enbrel Mini 50mg /ml NDC: 93235-573-22 Lot: [id number] Expiration: 04/2020  Patient tolerated well.  Observed for 30 mins in office for adverse reaction and none noted. Instructed patient to call with any questions/issues.    Rachael, patient advocate, assisted patient in enrolling for co-pay card.  She also reviewed [place].  Patient requested prescription be sent to [facility].  He will call pharmacy once he receives his co-pay card in the mail to set up pick up/shipment.  Patient will be taking a long trip to [facility] in the upcoming months and asked about storage.  Gave  patient information for Enbrel support which will send travel package and travel instructions.  All questions encouraged and answered.  Instructed patient to call with any further questions or concerns.   Mariella Saa, PharmD, [facility] Rheumatology Clinical Pharmacist  07/10/2018 10:13 AM

## 2018-07-10 NOTE — Telephone Encounter (Signed)
Patient can fill at any [place]

## 2018-07-14 NOTE — Telephone Encounter (Signed)
Left message for patient to confirm if he has received copay card details and to confirm shipment.

## 2018-07-17 ENCOUNTER — Encounter: Payer: Self-pay | Admitting: Rheumatology

## 2018-07-17 ENCOUNTER — Other Ambulatory Visit: Payer: Self-pay | Admitting: Rheumatology

## 2018-07-20 NOTE — Telephone Encounter (Signed)
Last Visit: 07/10/2018 Next Visit: 08/14/2018 Labs: 05/18/2018 CBC stable. CMP WNL  Okay to refill per Dr. Estanislado Pandy.

## 2018-07-21 ENCOUNTER — Encounter: Payer: Self-pay | Admitting: Rheumatology

## 2018-07-22 DIAGNOSIS — R51 Headache: Secondary | ICD-10-CM | POA: Diagnosis not present

## 2018-07-22 NOTE — Telephone Encounter (Signed)
Patient states he last had an injection of Enbrel and Otrexup on  07/17/18. Patient advised per Dr. Estanislado Pandy to be evaluated by PCP or emergency room. Patient verbalized understanding. Patient advised if there is infection or he is placed on antibiotics he should hold Enbrel and Otrexup.

## 2018-07-29 DIAGNOSIS — Z23 Encounter for immunization: Secondary | ICD-10-CM | POA: Diagnosis not present

## 2018-07-30 ENCOUNTER — Encounter: Payer: Self-pay | Admitting: Rheumatology

## 2018-07-31 NOTE — Progress Notes (Signed)
Office Visit Note  Patient: Benjamin Fields             Date of Birth: 02/11/1961           MRN: [id number]             PCP: Lennie Odor, PA-C Referring: Lennie Odor, PA-C Visit Date: 08/14/2018 Occupation: @GUAROCC @  Subjective:  Pain in multiple joints   History of Present Illness: Benjamin Fields is a 57 y.o. male with history of seronegative rheumatoid arthritis and degenerative disc disease.  He is injecting Enbrel sq injections once weekly, Otrexup 20 mg sq once weekly, and folic acid 2 mg po daily. He is due for his 5th Enbrel injection today.  He has not noticed any improvement since starting on Enbrel.  He continues to experience experience nausea and fatigue 12-36 hours after his otrexup injections.  He states he has been in severe pain for the past 1 week in multiple joints including bilateral hands, bilateral shoulders, bilateral hips, bilateral feet.  He states he is also been having increased discomfort in his lower back.  He reports that he is having severe pain in bilateral shoulders and has noted some swelling in the right shoulder.  He denies any other joint swelling at this time.   Activities of Daily Living:  Patient reports morning stiffness for 1  hour.   Patient Reports nocturnal pain.  Difficulty dressing/grooming: Denies Difficulty climbing stairs: Denies Difficulty getting out of chair: Reports Difficulty using hands for taps, buttons, cutlery, and/or writing: Denies  Review of Systems  Constitutional: Positive for fatigue. Negative for night sweats.  HENT: Negative for mouth sores, mouth dryness and nose dryness.   Eyes: Positive for dryness. Negative for redness and visual disturbance.  Respiratory: Negative for cough, hemoptysis, shortness of breath and difficulty breathing.   Cardiovascular: Negative for chest pain, palpitations, hypertension, irregular heartbeat and swelling in legs/feet.  Gastrointestinal: Negative for blood in stool, constipation  and diarrhea.  Endocrine: Negative for increased urination.  Genitourinary: Negative for painful urination.  Musculoskeletal: Positive for arthralgias, joint pain, myalgias, morning stiffness and myalgias. Negative for joint swelling, muscle weakness and muscle tenderness.  Skin: Negative for color change, rash, hair loss, nodules/bumps, skin tightness, ulcers and sensitivity to sunlight.  Allergic/Immunologic: Negative for susceptible to infections.  Neurological: Negative for dizziness, fainting, memory loss, night sweats and weakness.  Hematological: Negative for swollen glands.  Psychiatric/Behavioral: Positive for depressed mood and sleep disturbance. The patient is not nervous/anxious.     PMFS History:  Patient Active Problem List   Diagnosis Date Noted  . Rheumatoid arthritis of multiple sites with negative rheumatoid factor ([facility]) 05/19/2018  . Tinea versicolor 05/19/2018  . Essential hypertension 03/17/2018  . DDD (degenerative disc disease), cervical 03/16/2018    Past Medical History:  Diagnosis Date  . Hypertension   . RA (rheumatoid arthritis) (HCC)     Family History  Problem Relation Age of Onset  . Parkinson's disease Mother   . Heart failure Father   . Hypertension Father    Past Surgical History:  Procedure Laterality Date  . CERVICAL SPINE SURGERY    . TONSILLECTOMY     Social History   Social History Narrative  . Not on file    Objective: Vital Signs: BP 137/82 (BP Location: Left Arm, Patient Position: Sitting, Cuff Size: Normal)   Pulse 89   Resp 13   Ht 5\' 10"  (1.778 m)   Wt 196 lb (88.9 kg)  BMI 28.12 kg/m    Physical Exam Vitals signs and nursing note reviewed.  Constitutional:      Appearance: He is well-developed.  HENT:     Head: Normocephalic and atraumatic.  Eyes:     Conjunctiva/sclera: Conjunctivae normal.     Pupils: Pupils are equal, round, and reactive to light.  Neck:     Musculoskeletal: Normal range of motion and neck  supple.  Cardiovascular:     Rate and Rhythm: Normal rate and regular rhythm.     Heart sounds: Normal heart sounds.  Pulmonary:     Effort: Pulmonary effort is normal.     Breath sounds: Normal breath sounds.  Abdominal:     General: Bowel sounds are normal.     Palpations: Abdomen is soft.  Lymphadenopathy:     Cervical: No cervical adenopathy.  Skin:    General: Skin is warm and dry.     Capillary Refill: Capillary refill takes less than 2 seconds.  Neurological:     Mental Status: He is alert and oriented to person, place, and time.  Psychiatric:        Behavior: Behavior normal.      Musculoskeletal Exam: C-spine, thoracic spine, and lumbar spine good ROM. No midline spinal tenderness.  No SI joint tenderness. Right shoulder abduction to 40 degrees. Right shoulder joint effusion. Left shoulder abduction to 90 degrees.  Elbow joints, wrist joints, MCPs, PIPs, and DIPs good ROM with no synovitis.  Complete fist formation bilaterally.  Limited ROM of both hip joints with discomfort.  Knee joints good ROM with no discomfort. No warmth or effusion of knee joints.Tenderness of both ankle joints.   CDAI Exam: CDAI Score: 1  Patient Global Assessment: 5 (mm); Provider Global Assessment: 5 (mm) Swollen: 0 ; Tender: 0  Joint Exam   Not documented   There is currently no information documented on the homunculus. Go to the Rheumatology activity and complete the homunculus joint exam.  Investigation: No additional findings.  Imaging: No results found.  Recent Labs: Lab Results  Component Value Date   WBC 8.9 05/18/2018   HGB 15.1 05/18/2018   PLT 235 05/18/2018   NA 140 05/18/2018   K 4.5 05/18/2018   CL 104 05/18/2018   CO2 29 05/18/2018   GLUCOSE 96 05/18/2018   BUN 17 05/18/2018   CREATININE 1.11 05/18/2018   BILITOT 0.6 05/18/2018   AST 21 05/18/2018   ALT 36 05/18/2018   PROT 6.3 05/18/2018   CALCIUM 10.2 05/18/2018   GFRAA 84 05/18/2018   QFTBGOLDPLUS NEGATIVE  03/17/2018    Speciality Comments: No specialty comments available.  Procedures:  No procedures performed Allergies: Cortisone    Assessment / Plan:     Visit Diagnoses: Rheumatoid arthritis of multiple sites with negative rheumatoid factor (HCC) - RF-, CCP-, 14-3-3 eta negative: He continues to have a right shoulder joint effusion.  He has no other synovitis on exam.  He has been having pain in multiple joints including bilateral shoulders, bilateral hands, bilateral hips, and bilateral feet.  He is due for his 5th Enbrel injection today.  He continues to inject Enbrel 50 mg subcutaneously once weekly and Otrexup 20 mg subcutaneously once weekly.  He has not noticed any improvement since starting on Enbrel.  A prednisone taper starting at 20 mg and tapering by 5 mg every 1 week was sent to the pharmacy today.  We will increase his dose of OTREXUP to 25 mg subcutaneously once weekly.  He  was provided 3 samples today in the office.  He performed his Enbrel injection today in the office.  We discussed taking Zyrtec the day before, day of, and day after his Enbrel injections if he continues to have local injection site reactions.  He is advised to notify us if he develops increased joint pain or joint swelling.  He will follow-up in the office in 6 weeks.  High risk medication use - otrexup, folic acid, Enbrel.  CBC and CMP will be drawn today to monitor for drug toxicity.  He will return in March and every 3 months for lab work.- Plan: COMPLETE METABOLIC PANEL WITH GFR, CBC with Differential/Platelet  Effusion of right shoulder - He has a right shoulder joint effusion. He declined a cortisone injection. A prednisone taper starting at 20 mg and tapering by 5 mg every week was sent to the pharmacy.    Other fatigue: He continues to have chronic fatigue that is worsened 12 to 36 hours after his Otrexup injections.  DDD (degenerative disc disease), cervical - S/p fusion: He has good ROM with no  discomfort at this time.  He has no symptoms of radiculopathy at this time.   Tinea versicolor - Resolved with Diflucan.  Effusion of left knee - resolved.    Essential hypertension   Orders: Orders Placed This Encounter  Procedures  . COMPLETE METABOLIC PANEL WITH GFR  . CBC with Differential/Platelet   Meds ordered this encounter  Medications  . predniSONE (DELTASONE) 5 MG tablet    Sig: Take 4 tablets by mouth daily x1 wk, 3 tablets by mouth daily x1wk, 2 tablets by mouth daily x1wk, 1 tablet by mouth daily x1wk.    Dispense:  70 tablet    Refill:  0    Face-to-face time spent with patient was 30 minutes. Greater than 50% of time was spent in counseling and coordination of care.  Follow-Up Instructions: Return for Rheumatoid arthritis, DDD.   Ofilia Neas, PA-C   I examined and evaluated the patient with Hazel Sams PA.  Patient has been on Enbrel for a month now.  He has not noticed much improvement in his symptoms.  He had been having increased pain and swelling of his bilateral shoulder joints since he has tapered prednisone.  He had effusion in his right shoulder joint on examination today.  We discussed trying Enbrel for another month to see the response.  We also gave him some samples of Otrexup 25 mg subcu to see if he responds to a higher dose of Otrexup.  If he has an adequate response to the combination therapy we may have to switch him to another anti-TNF.  The plan of care was discussed as noted above.  Bo Merino, MD  Note - This record has been created using Editor, commissioning.  Chart creation errors have been sought, but may not always  have been located. Such creation errors do not reflect on  the standard of medical care.

## 2018-07-31 NOTE — Telephone Encounter (Signed)
Attempted to contact the patient and left message for patient to call the office.  

## 2018-07-31 NOTE — Telephone Encounter (Signed)
Patient states he received his shingles vaccine earlier this week. Patient asked if the symptoms he is experiencing are typical after getting the the vaccine. Patient advise to contact PCP as they are the ones who gave the vaccination.

## 2018-08-03 ENCOUNTER — Encounter: Payer: Self-pay | Admitting: Rheumatology

## 2018-08-03 DIAGNOSIS — R202 Paresthesia of skin: Principal | ICD-10-CM

## 2018-08-03 DIAGNOSIS — R200 Anesthesia of skin: Secondary | ICD-10-CM

## 2018-08-07 ENCOUNTER — Telehealth: Payer: Self-pay | Admitting: Rheumatology

## 2018-08-07 NOTE — Telephone Encounter (Signed)
Left message to advise patient he may wait until his appointment time to do his lab work.

## 2018-08-07 NOTE — Telephone Encounter (Signed)
Patient called stating he has an appointment with Dr. Estanislado Pandy on Friday 08/14/18 and is asking if he should have his labwork done before that appointment.  Patient requested a return call.

## 2018-08-14 ENCOUNTER — Encounter: Payer: Self-pay | Admitting: Rheumatology

## 2018-08-14 ENCOUNTER — Ambulatory Visit: Payer: BLUE CROSS/BLUE SHIELD | Admitting: Rheumatology

## 2018-08-14 ENCOUNTER — Telehealth: Payer: Self-pay | Admitting: Pharmacist

## 2018-08-14 VITALS — BP 137/82 | HR 89 | Resp 13 | Ht 70.0 in | Wt 196.0 lb

## 2018-08-14 DIAGNOSIS — R5383 Other fatigue: Secondary | ICD-10-CM | POA: Diagnosis not present

## 2018-08-14 DIAGNOSIS — M5030 Other cervical disc degeneration, unspecified cervical region: Secondary | ICD-10-CM

## 2018-08-14 DIAGNOSIS — I10 Essential (primary) hypertension: Secondary | ICD-10-CM

## 2018-08-14 DIAGNOSIS — B360 Pityriasis versicolor: Secondary | ICD-10-CM

## 2018-08-14 DIAGNOSIS — M25462 Effusion, left knee: Secondary | ICD-10-CM

## 2018-08-14 DIAGNOSIS — Z79899 Other long term (current) drug therapy: Secondary | ICD-10-CM

## 2018-08-14 DIAGNOSIS — M25411 Effusion, right shoulder: Secondary | ICD-10-CM

## 2018-08-14 DIAGNOSIS — M0609 Rheumatoid arthritis without rheumatoid factor, multiple sites: Secondary | ICD-10-CM | POA: Diagnosis not present

## 2018-08-14 MED ORDER — PREDNISONE 5 MG PO TABS: ORAL_TABLET | ORAL | 0 refills | Status: DC

## 2018-08-14 NOTE — Patient Instructions (Addendum)
Standing Labs We placed an order today for your standing lab work.    Please come back and get your standing labs in March and every 3 months   We have open lab Monday through Friday from 8:30-11:30 AM and 1:30-4:00 PM  at the office of Dr. Bo Merino.   You may experience shorter wait times on Monday and Friday afternoons. The office is located at [address] [place], [place], [place] [address] No appointment is necessary.   Labs are drawn by [facility].  You may receive a bill from [place] for your lab work.  If you wish to have your labs drawn at another location, please call the office 24 hours in advance to send orders.  If you have any questions regarding directions or hours of operation,  please call [phone].   Just as a reminder please drink plenty of water prior to coming for your lab work. Thanks!  Helpful Tips for Injecting To help alleviate pain and injection site reactions consider the following tips:  . Placing something cold (like and ice gel pack or cold water bottle) on the injection site just before cleansing with alcohol may help reduce pain . If you have a localized reaction (redness, mild swelling, warmth, and itching) you can use topical corticosteroids (hydrocortisone cream) or antihistamine (Claritin) to help minimize reaction the day of, the day before, and the day after injecting . Always inject this medication at room temperature (remove from refrigerator 15-20 minutes before injecting; this may help eliminate stinging). . Always rotate your injection sites using inside/outside thigh of both legs, abdomen divided into 4 quadrants (stay away from waist line, and 2" away from navel). . Do not inject into areas where skin is tender, bruised, red, or hard or where there are scars or stretch marks. . Always let alcohol dry on the skin before injecting. . Place a cold, damp towel or small ice pack on the injection site for 10 or 15 minutes every 1 to 2  hours if it hurts or is swollen.

## 2018-08-14 NOTE — Telephone Encounter (Signed)
Received a Prior Authorization request from [employer] for Humira. Authorization has been submitted to patient's insurance via Cover My Meds. Will update once we receive a response.

## 2018-08-14 NOTE — Telephone Encounter (Signed)
Please start benefits investigation for Humira.  Patient is currently taking methotrexate and Enbrel with inadequate response.

## 2018-08-14 NOTE — Progress Notes (Signed)
Pharmacy Note  Patient presents to [place] to see Dr. Estanislado Pandy for rheumatoid arthritis follow-up.  He is having a flare despite being on methotrexate 20 mg weekly and Enbrel 50 mg weekly started on 07/10/18.  Patient also complains of injection site reactions.  Assessment/Plan:  Increase dose of methotrexate to 25 mg and monitor response as he has only been on Enbrel for 1 month.  If symptoms don't improve will consider switching to Humira.  Benefits investigation has already been started in order to prevent delays in initiation.  He will be traveling to [place] over the holidays.  Informed patient that Enbrel is stable at room temperature for 14 days so he can travel with medication instead of delaying dose for 2 days.  Medication Samples have been provided to the patient.  Drug name: Otrexup Strength: 25mg  Qty: 3   LOT: [id number]   Exp.Date: 5/20  Dosing instructions: Inject one pen weekly  The patient has been instructed regarding the correct time, dose, and frequency of taking this medication, including desired effects and most common side effects.   Patient also had problems with Enbrel device with last dose. Reviewed manual and determined that the error was due to removing device too soon.  Caregiver and patient affirmed that there was no medication left in cartridge and did not drip on skin so believe that patient obtained full dose.  Demonstrated proper injection technique with Enbreldemo pen.  Patient able to demonstrate proper injection technique using the teach back method. Also reviewed injection site reaction management strategies such as icing or using an antihistamine.  All questions encouraged and answered.  Instructed patient to call with any further questions or concerns.  Mariella Saa, PharmD, [facility] Rheumatology Clinical Pharmacist  08/14/2018 10:57 AM

## 2018-08-15 LAB — CBC WITH DIFFERENTIAL/PLATELET
ABSOLUTE MONOCYTES: 724 {cells}/uL (ref 200–950)
BASOS ABS: 128 {cells}/uL (ref 0–200)
Basophils Relative: 1.8 %
EOS ABS: 632 {cells}/uL — AB (ref 15–500)
Eosinophils Relative: 8.9 %
HCT: 42.2 % (ref 38.5–50.0)
Hemoglobin: 15.3 g/dL (ref 13.2–17.1)
Lymphs Abs: 1711 cells/uL (ref 850–3900)
MCH: 34.3 pg — AB (ref 27.0–33.0)
MCHC: 36.3 g/dL — AB (ref 32.0–36.0)
MCV: 94.6 fL (ref 80.0–100.0)
MPV: 10 fL (ref 7.5–12.5)
Monocytes Relative: 10.2 %
NEUTROS PCT: 55 %
Neutro Abs: 3905 cells/uL (ref 1500–7800)
PLATELETS: 241 10*3/uL (ref 140–400)
RBC: 4.46 10*6/uL (ref 4.20–5.80)
RDW: 14 % (ref 11.0–15.0)
TOTAL LYMPHOCYTE: 24.1 %
WBC: 7.1 10*3/uL (ref 3.8–10.8)

## 2018-08-15 LAB — COMPLETE METABOLIC PANEL WITH GFR
AG Ratio: 2.4 (calc) (ref 1.0–2.5)
ALT: 44 U/L (ref 9–46)
AST: 20 U/L (ref 10–35)
Albumin: 4.4 g/dL (ref 3.6–5.1)
Alkaline phosphatase (APISO): 67 U/L (ref 40–115)
BILIRUBIN TOTAL: 0.7 mg/dL (ref 0.2–1.2)
BUN: 14 mg/dL (ref 7–25)
CHLORIDE: 108 mmol/L (ref 98–110)
CO2: 29 mmol/L (ref 20–32)
Calcium: 10.1 mg/dL (ref 8.6–10.3)
Creat: 1.25 mg/dL (ref 0.70–1.33)
GFR, Est African American: 73 mL/min/{1.73_m2} (ref >=60)
GFR, Est Non African American: 63 mL/min/{1.73_m2} (ref >=60)
GLUCOSE: 94 mg/dL (ref 65–99)
Globulin: 1.8 g/dL (calc) — ABNORMAL LOW (ref 1.9–3.7)
Potassium: 4.6 mmol/L (ref 3.5–5.3)
Sodium: 145 mmol/L (ref 135–146)
Total Protein: 6.2 g/dL (ref 6.1–8.1)

## 2018-08-17 NOTE — Progress Notes (Signed)
Labs are stable.

## 2018-08-17 NOTE — Telephone Encounter (Signed)
Received a fax from [facility] regarding a prior authorization for Humira. Authorization has been APPROVED from 08/14/18 to 08/25/2038.   Will send document to scan center.  Authorization # [id number]  Patient has commercial plan, so he is copay card eligible.  3:39 PM Beatriz Chancellor, CPhT

## 2018-08-20 DIAGNOSIS — H11153 Pinguecula, bilateral: Secondary | ICD-10-CM | POA: Diagnosis not present

## 2018-08-20 DIAGNOSIS — H04123 Dry eye syndrome of bilateral lacrimal glands: Secondary | ICD-10-CM | POA: Diagnosis not present

## 2018-08-20 DIAGNOSIS — H1045 Other chronic allergic conjunctivitis: Secondary | ICD-10-CM | POA: Diagnosis not present

## 2018-08-21 ENCOUNTER — Telehealth: Payer: Self-pay | Admitting: *Deleted

## 2018-08-21 MED ORDER — METHOTREXATE (PF) 25 MG/0.4ML ~~LOC~~ SOAJ: 25.0000 mg | SUBCUTANEOUS | 0 refills | Status: DC

## 2018-08-21 NOTE — Telephone Encounter (Signed)
Patient contacted the office this morning with his wife Benjamin Fields. Benjamin Fields administered the Otrexup to Benjamin Fields this morning. She states that when she injected the medication the she can not see the red indicator in the viewing window advising that the medication has been given. Patient has a puncture mark where the needle has went into his skin. Emily that she would need to wait until next week to given another injection as we are unsure of how much of the medication he may have gotten from this Otrexup pen. Patient states he also needs a prescription on the Otrexup 25 mg sent to the pharmacy.   Last Visit: 08/14/18 Next visit: 09/25/18 Labs: 08/14/18 stable   Okay to refill per Dr. Estanislado Pandy

## 2018-08-30 ENCOUNTER — Encounter: Payer: Self-pay | Admitting: Rheumatology

## 2018-08-31 NOTE — Telephone Encounter (Signed)
Patient states the numbness and tingling has increased over the weekend in feet. Patient states he has noticed it 5-6 times a day. Patient states it is more numbness than tingling. Patient states he does not recall any type of injury. Patient states he has been taking it easy the last 3-4 months. Patient states there is no swelling. Patient would like to know if he should be concerned and what he needs to do about it. Patient was last seen 08/14/18 and due to follow up 09/25/18. Patient is on Otrexup 25 mg and Enbrel mini weekly.

## 2018-08-31 NOTE — Telephone Encounter (Signed)
He does not need to discontinue medications.  He should please schedule an appointment with the neurologist.

## 2018-08-31 NOTE — Telephone Encounter (Signed)
Attempted to contact the patient and left message for patient to call the office.  

## 2018-09-04 ENCOUNTER — Encounter: Payer: Self-pay | Admitting: Diagnostic Neuroimaging

## 2018-09-04 ENCOUNTER — Encounter: Payer: Self-pay | Admitting: Rheumatology

## 2018-09-04 ENCOUNTER — Ambulatory Visit: Payer: BLUE CROSS/BLUE SHIELD | Admitting: Diagnostic Neuroimaging

## 2018-09-04 VITALS — BP 134/77 | HR 75 | Ht 70.0 in | Wt 192.0 lb

## 2018-09-04 DIAGNOSIS — R200 Anesthesia of skin: Secondary | ICD-10-CM | POA: Diagnosis not present

## 2018-09-04 DIAGNOSIS — R292 Abnormal reflex: Secondary | ICD-10-CM

## 2018-09-04 NOTE — Progress Notes (Signed)
[facility]  PATIENT: Benjamin Fields DOB: 11/01/1960  REFERRING CLINICIAN: S Deveshwar HISTORY FROM: patient and wife  REASON FOR VISIT: new consult    HISTORICAL  CHIEF COMPLAINT:  Chief Complaint  Patient presents with  . New Patient (Initial Visit)    referred by Dr. Cy Blamer  . Numbness feet ,hands    Rm 6, wife.  Has had numbness (intermittent for years then has progressed to to daily the last 2 wks.      HISTORY OF PRESENT ILLNESS:   58 year old male here for evaluation of numbness and tingling.  May 2019 patient had onset of left knee and right shoulder pain and swelling.  He was diagnosed with rheumatoid arthritis. He also had red eyes, dry eyes and generalized pain.  He has been started on methotrexate in July 2019 and Enbrel in November 2019.  He is also been on prednisone.   Early on his diagnosis he noticed some tingling sensation in his right hand.  He also notes that in his right leg.  He has mild numbness and tingling on the left side as well.  No sensations in his face, neck or lower back.    REVIEW OF SYSTEMS: Full 14 system review of systems performed and negative with exception of: Restless legs numbness weakness depression joint pain feeling cold eye pain fatigue decreased energy disinterest activities.   ALLERGIES: Allergies  Allergen Reactions  . Cortisone     FLARE    HOME MEDICATIONS: Outpatient Medications Prior to Visit  Medication Sig Dispense Refill  . Etanercept (ENBREL MINI) 50 MG/ML SOCT Inject 50 mg into the skin once a week. 12 Cartridge 0  . folic acid (FOLVITE) 1 MG tablet Take 2 tablets (2 mg total) by mouth daily. 180 tablet 2  . losartan (COZAAR) 25 MG tablet Take 25 mg by mouth daily. for high blood pressure  1  . Methotrexate, PF, (OTREXUP) 25 MG/0.4ML SOAJ Inject 25 mg into the skin once a week. 12 pen 0  . predniSONE (DELTASONE) 5 MG tablet Take 4 tablets by mouth daily x1 wk, 3 tablets by mouth  daily x1wk, 2 tablets by mouth daily x1wk, 1 tablet by mouth daily x1wk. 70 tablet 0  . traMADol (ULTRAM) 50 MG tablet TAKE 2 TABLETS BY MOUTH EVERY 6 HOURS AS NEEDED FOR  UP  TO  7  DAYS 40 tablet 0  . olopatadine (PATANOL) 0.1 % ophthalmic solution 1 drop as needed for allergies.     No facility-administered medications prior to visit.     PAST MEDICAL HISTORY: Past Medical History:  Diagnosis Date  . Hypertension   . RA (rheumatoid arthritis) ([place])     PAST SURGICAL HISTORY: Past Surgical History:  Procedure Laterality Date  . CERVICAL SPINE SURGERY  2010  . TONSILLECTOMY      FAMILY HISTORY: Family History  Problem Relation Age of Onset  . Parkinson's disease Mother   . Prostate cancer Mother   . Atrial fibrillation Mother   . Heart failure Father   . Hypertension Father     SOCIAL HISTORY: Social History   Socioeconomic History  . Marital status: Single    Spouse name: Not on file  . Number of children: Not on file  . Years of education: Not on file  . Highest education level: Not on file  Occupational History  . Not on file  Social Needs  . Financial resource strain: Not on file  . Food insecurity:  Worry: Not on file    Inability: Not on file  . Transportation needs:    Medical: Not on file    Non-medical: Not on file  Tobacco Use  . Smoking status: Never Smoker  . Smokeless tobacco: Never Used  Substance and Sexual Activity  . Alcohol use: Never    Frequency: Never  . Drug use: Never  . Sexual activity: Not on file  Lifestyle  . Physical activity:    Days per week: Not on file    Minutes per session: Not on file  . Stress: Not on file  Relationships  . Social connections:    Talks on phone: Not on file    Gets together: Not on file    Attends religious service: Not on file    Active member of club or organization: Not on file    Attends meetings of clubs or organizations: Not on file    Relationship status: Not on file  . Intimate  partner violence:    Fear of current or ex partner: Not on file    Emotionally abused: Not on file    Physically abused: Not on file    Forced sexual activity: Not on file  Other Topics Concern  . Not on file  Social History Narrative   Lives home with wife.  workd at [employer]  Education: Masters.  Children 2.  Caffeine 2 cups daily     PHYSICAL EXAM  GENERAL EXAM/CONSTITUTIONAL: Vitals:  Vitals:   09/04/18 1018  BP: 134/77  Pulse: 75  Weight: 192 lb (87.1 kg)  Height: 5\' 10"  (1.778 m)     Body mass index is 27.55 kg/m. Wt Readings from Last 3 Encounters:  09/04/18 192 lb (87.1 kg)  08/14/18 196 lb (88.9 kg)  07/10/18 193 lb (87.5 kg)     Patient is in no distress; well developed, nourished and groomed; neck is supple  CARDIOVASCULAR:  Examination of carotid arteries is normal; no carotid bruits  Regular rate and rhythm, no murmurs  Examination of peripheral vascular system by observation and palpation is normal  EYES:  Ophthalmoscopic exam of optic discs and posterior segments is normal; no papilledema or hemorrhages  Visual Acuity Screening   Right eye Left eye Both eyes  Without correction:     With correction: 20/40 20/40      MUSCULOSKELETAL:  Gait, strength, tone, movements noted in Neurologic exam below  NEUROLOGIC: MENTAL STATUS:  No flowsheet data found.  awake, alert, oriented to person, place and time  recent and remote memory intact  normal attention and concentration  language fluent, comprehension intact, naming intact  fund of knowledge appropriate  CRANIAL NERVE:   2nd - no papilledema on fundoscopic exam  2nd, 3rd, 4th, 6th - pupils equal and reactive to light, visual fields full to confrontation, extraocular muscles intact, no nystagmus  5th - facial sensation symmetric  7th - facial strength symmetric  8th - hearing intact  9th - palate elevates symmetrically, uvula midline  11th - shoulder shrug  symmetric  12th - tongue protrusion midline  MOTOR:   normal bulk and tone, EXCEPT INCREASED TONE IN BLE  full strength in the BUE, BLE  SENSORY:   normal and symmetric to light touch, pinprick, temperature, vibration; EXCEPT DECR PP IN FEET IN GRADIENT UP TO KNEES; DECR VIB AT TOES  COORDINATION:   finger-nose-finger, fine finger movements normal  REFLEXES:   deep tendon reflexes present and symmetric; BRISK IN BLE (POSITIVE SUPRAPATELLAR AND  LEFT ADDUCTOR REFLEXES)  NEGATIVE HOFFMANS  GAIT/STATION:   narrow based gait; romberg is negative     DIAGNOSTIC DATA (LABS, IMAGING, TESTING) - I reviewed patient records, labs, notes, testing and imaging myself where available.  Lab Results  Component Value Date   WBC 7.1 08/14/2018   HGB 15.3 08/14/2018   HCT 42.2 08/14/2018   MCV 94.6 08/14/2018   PLT 241 08/14/2018      Component Value Date/Time   NA 145 08/14/2018 0952   K 4.6 08/14/2018 0952   CL 108 08/14/2018 0952   CO2 29 08/14/2018 0952   GLUCOSE 94 08/14/2018 0952   BUN 14 08/14/2018 0952   CREATININE 1.25 08/14/2018 0952   CALCIUM 10.1 08/14/2018 0952   PROT 6.2 08/14/2018 0952   AST 20 08/14/2018 0952   ALT 44 08/14/2018 0952   BILITOT 0.7 08/14/2018 0952   GFRNONAA 63 08/14/2018 0952   GFRAA 73 08/14/2018 0952   No results found for: CHOL, HDL, LDLCALC, LDLDIRECT, TRIG, CHOLHDL No results found for: HGBA1C No results found for: VITAMINB12 Lab Results  Component Value Date   TSH 1.03 03/17/2018    03/14/18 MRI left knee 1. Patellofemoral joint degenerative changes and findings suggest lateral patellar compression syndrome. Slight lateral tilt of the patella and widened TT-TG distance. 2. Intact ligamentous structures and no acute bony findings. 3. Degenerative changes involving the posterior horn of the medial meniscus but no discrete tear. 4. Large joint effusion and mild synovitis. Small leaking Baker's cyst. 5. Nonspecific edema like  signal abnormality in the vastus medialis and lateralis muscles as discussed above.    ASSESSMENT AND PLAN  58 y.o. year old male here with new diagnosis of rheumatoid arthritis now on therapy, also with onset of numbness and tingling sensations.  We will proceed with further work-up.  Patient noted to have hyperreflexia therefore need to rule out myelopathy.  Also will check additional lab testing and EMG.   Dx:  1. Numbness   2. Hyperreflexia     PLAN:  Orders Placed This Encounter  Procedures  . MR CERVICAL SPINE W WO CONTRAST  . MR THORACIC SPINE W WO CONTRAST  . Sjogren's syndrome antibods(ssa + ssb)  . Pan-ANCA  . Vitamin B12  . NCV with EMG(electromyography)   Return for for NCV/EMG.    Penni Bombard, MD 3/41/9622, 29:79 AM Certified in Neurology, Neurophysiology and Neuroimaging  [facility] [address] [place], [place] [address] [phone]

## 2018-09-04 NOTE — Telephone Encounter (Signed)
Medication Samples have been provided to the patient.  Drug name: Otrexup       Strength: 25 mg     Qty: 1  LOT: [id number]  Exp.Date: 12/2018  Dosing instructions: Inject 1 pen into skin weekly.   The patient has been instructed regarding the correct time, dose, and frequency of taking this medication, including desired effects and most common side effects.   Gwenlyn Perking 11:38 AM 09/04/2018

## 2018-09-06 ENCOUNTER — Encounter: Payer: Self-pay | Admitting: Rheumatology

## 2018-09-07 ENCOUNTER — Telehealth: Payer: Self-pay | Admitting: Diagnostic Neuroimaging

## 2018-09-07 NOTE — Telephone Encounter (Signed)
[employer] Auth: [id number] (exp. 09/07/18 to 10/06/18) order sent to GI lvm for pt to be aware and left GI phone number of [phone] and to give them a call if he has not heard from them in the next 2-3 business days.

## 2018-09-09 ENCOUNTER — Encounter: Payer: Self-pay | Admitting: Neurology

## 2018-09-09 ENCOUNTER — Telehealth: Payer: Self-pay | Admitting: Pharmacist

## 2018-09-09 ENCOUNTER — Telehealth: Payer: Self-pay | Admitting: Neurology

## 2018-09-09 DIAGNOSIS — B353 Tinea pedis: Secondary | ICD-10-CM | POA: Diagnosis not present

## 2018-09-09 LAB — PAN-ANCA
ANCA Proteinase 3: <3.5 U/mL (ref 0.0–3.5)
Myeloperoxidase Ab: <9 U/mL (ref 0.0–9.0)

## 2018-09-09 LAB — VITAMIN B12: VITAMIN B 12: 1103 pg/mL (ref 232–1245)

## 2018-09-09 LAB — SJOGREN'S SYNDROME ANTIBODS(SSA + SSB): ENA SSA (RO) Ab: <0.2 AI (ref 0.0–0.9)

## 2018-09-09 NOTE — Telephone Encounter (Signed)
I received a phone call from Dr. Leta Baptist today.  He believes the patient's symptoms may be related to spinal stenosis.  He does not think Enbrel is contributing to his symptoms.  Advised patient to resume Enbrel.  I could not reach patient.  You may send him a my chart message or try to reach him later.

## 2018-09-09 NOTE — Telephone Encounter (Signed)
-----   Message from Penni Bombard, MD sent at 09/09/2018  1:19 PM EST ----- Normal labs.  -VRP

## 2018-09-09 NOTE — Telephone Encounter (Signed)
Called the patient to make him aware that lab work was in normal range and Dr Leta Baptist didn't see anything of any concern. There was no answer. LVM informing the patient of this information and instructed the patient to call back with any questions. I will also send out a my chart message.

## 2018-09-09 NOTE — Telephone Encounter (Signed)
Called patient per Dr. Arlean Hopping request to notify patient that MRI studies showed no issues relating to Enbrel use and that he may resume.  Left voicemail for patient to return call.

## 2018-09-11 NOTE — Telephone Encounter (Signed)
Medication Samples have been provided to the patient.  Drug name: Otrexup      Strength: 25 mg    Qty: 1  LOT: [id number]  Exp.Date: 11/2019  Dosing instructions: Inject 1 pen SQ weekly.  The patient has been instructed regarding the correct time, dose, and frequency of taking this medication, including desired effects and most common side effects.   Gwenlyn Perking 12:11 PM 09/11/2018

## 2018-09-11 NOTE — Progress Notes (Signed)
Office Visit Note  Patient: Benjamin Fields             Date of Birth: November 03, 1960           MRN: [id number]             PCP: Lennie Odor, PA-C Referring: Lennie Odor, PA-C Visit Date: 09/25/2018 Occupation: @GUAROCC @  Subjective:  Pain in both shoulders and hands.   History of Present Illness: Benjamin Fields is a 58 y.o. male  with history of seronegative rheumatoid arthritis and degenerative disc disease.  He is currently taking Enbrel mini 50 mg weekly, Otrexup 20 mg weekly along with folic acid 2 mg daily.  He finished his prednisone taper on Friday and has noticed increased joint pain.  He is still having discomfort in his right shoulder as well as bilateral hips, knees, and hands.  Believes that his right shoulder is swollen.  He also reports numbness in his feet and hands and is being evaluated by neurology.  He reports rash on the soles of his feet for which he was seen by dermatology.  The dermatologist suggested that he may have psoriatic arthritis.  He will be traveling out of the country for a week and is concerned about his increased discomfort.  Activities of Daily Living:  Patient reports morning stiffness for 1 hour.   Patient Reports nocturnal pain.  Difficulty dressing/grooming: Denies Difficulty climbing stairs: Denies Difficulty getting out of chair: Reports Difficulty using hands for taps, buttons, cutlery, and/or writing: Denies  Review of Systems  Constitutional: Positive for fatigue. Negative for night sweats.  HENT: Negative for mouth sores, mouth dryness and nose dryness.   Eyes: Positive for dryness. Negative for redness.  Respiratory: Positive for cough. Negative for shortness of breath and difficulty breathing.   Cardiovascular: Negative for chest pain, palpitations, hypertension, irregular heartbeat and swelling in legs/feet.  Gastrointestinal: Negative for blood in stool, constipation and diarrhea.  Endocrine: Negative for increased urination.    Musculoskeletal: Positive for arthralgias, joint pain, joint swelling and morning stiffness. Negative for myalgias, muscle weakness, muscle tenderness and myalgias.  Skin: Positive for rash. Negative for color change, hair loss, nodules/bumps, skin tightness, ulcers and sensitivity to sunlight.  Allergic/Immunologic: Negative for susceptible to infections.  Neurological: Positive for numbness and headaches. Negative for dizziness, fainting, memory loss, night sweats and weakness ( ).  Hematological: Negative for swollen glands.  Psychiatric/Behavioral: Positive for depressed mood and sleep disturbance. The patient is nervous/anxious.     PMFS History:  Patient Active Problem List   Diagnosis Date Noted  . Rheumatoid arthritis of multiple sites with negative rheumatoid factor ([place]) 05/19/2018  . Tinea versicolor 05/19/2018  . Essential hypertension 03/17/2018  . DDD (degenerative disc disease), cervical 03/16/2018    Past Medical History:  Diagnosis Date  . Hypertension   . RA (rheumatoid arthritis) ([facility])     Family History  Problem Relation Age of Onset  . Parkinson's disease Mother   . Prostate cancer Mother   . Atrial fibrillation Mother   . Heart failure Father   . Hypertension Father    Past Surgical History:  Procedure Laterality Date  . CERVICAL SPINE SURGERY  2010  . TONSILLECTOMY     Social History   Social History Narrative   Lives home with wife.  workd at [employer]  Education: Masters.  Children 2.  Caffeine 2 cups daily   Immunization History  Administered Date(s) Administered  . Influenza,inj,Quad PF,6+ Mos 07/06/2016  Objective: Vital Signs: BP 139/90 (BP Location: Left Arm, Patient Position: Sitting, Cuff Size: Normal)   Pulse 67   Resp 14   Ht 5\' 10"  (1.778 m)   Wt 193 lb (87.5 kg)   BMI 27.69 kg/m    Physical Exam Vitals signs and nursing note reviewed.  Constitutional:      Appearance: He is well-developed.  HENT:     Head:  Normocephalic and atraumatic.  Eyes:     Conjunctiva/sclera: Conjunctivae normal.     Pupils: Pupils are equal, round, and reactive to light.     Comments: Left eye conjunctival injection was noted.  Neck:     Musculoskeletal: Normal range of motion and neck supple.  Cardiovascular:     Rate and Rhythm: Normal rate and regular rhythm.     Heart sounds: Normal heart sounds.  Pulmonary:     Effort: Pulmonary effort is normal.     Breath sounds: Normal breath sounds.  Abdominal:     General: Bowel sounds are normal.     Palpations: Abdomen is soft.  Skin:    General: Skin is warm and dry.     Capillary Refill: Capillary refill takes less than 2 seconds.  Neurological:     Mental Status: He is alert and oriented to person, place, and time.  Psychiatric:        Behavior: Behavior normal.      Musculoskeletal Exam: Cervical spine was in limited range of motion.  Thoracic and lumbar spine good range of motion.  He has some warmth on palpation over his right shoulder joint and some swelling was noted.  He had painful range of motion of bilateral shoulders.  Elbow joints wrist joints MCPs PIPs DIPs with good range of motion with no synovitis.  Hip joints knee joints ankles MTPs PIPs been good range of motion with no synovitis.  CDAI Exam: CDAI Score: 5  Patient Global Assessment: 5 (mm); Provider Global Assessment: 5 (mm) Swollen: 2 ; Tender: 2  Joint Exam      Right  Left  Glenohumeral  Swollen Tender  Swollen Tender     Investigation: No additional findings.  Imaging: No results found.  Recent Labs: Lab Results  Component Value Date   WBC 7.1 08/14/2018   HGB 15.3 08/14/2018   PLT 241 08/14/2018   NA 145 08/14/2018   K 4.6 08/14/2018   CL 108 08/14/2018   CO2 29 08/14/2018   GLUCOSE 94 08/14/2018   BUN 14 08/14/2018   CREATININE 1.25 08/14/2018   BILITOT 0.7 08/14/2018   AST 20 08/14/2018   ALT 44 08/14/2018   PROT 6.2 08/14/2018   CALCIUM 10.1 08/14/2018    GFRAA 73 08/14/2018   QFTBGOLDPLUS NEGATIVE 03/17/2018    Speciality Comments: No specialty comments available.  Procedures:  No procedures performed Allergies: Cortisone   Assessment / Plan:     Visit Diagnoses: Rheumatoid arthritis of multiple sites with negative rheumatoid factor ([facility]) -  RF-, CCP-, 14-3-3 eta negative.  He has been on Enbrel and Rituxan combination for 3 months now.  He states he does well on prednisone but as soon as the prednisone is tapered his symptoms come back.  He still have a lot of discomfort with range of motion of his shoulders.  Warmth and swelling was noted in his right shoulder.  We had detailed discussion regarding switching from Enbrel to Humira.  I have advised him to get MRI of his brain prior to the switch.  He is also traveling out of country.  I will see him back in 4 weeks.  High risk medication use - Enbrel, Otrexup 25 mg sq once weekly, folic acid 2 mg po daily.  His labs are stable.  Effusion of right shoulder-he still has warmth and swelling in his right shoulder and discomfort range of motion of bilateral shoulders.  DDD (degenerative disc disease), cervical - s/p fusion.  He has limited range of motion.  Other fatigue-he has ongoing fatigue.  Tinea versicolor-has cleared up.  He had some heat rash on his skin.  I have advised him to use Dial soap.  Essential hypertension-blood pressure is slightly elevated.  Have advised him to monitor blood pressure and follow-up with the PCP.  Numbness and tingling of both feet -he has not had MRI of his brain yet.  Have advised him to schedule MRI of his brain.  Orders: No orders of the defined types were placed in this encounter.  No orders of the defined types were placed in this encounter.     Follow-Up Instructions: Return in about 4 weeks (around 10/23/2018).   Bo Merino, MD  Note - This record has been created using Editor, commissioning.  Chart creation errors have been sought, but  may not always  have been located. Such creation errors do not reflect on  the standard of medical care.

## 2018-09-25 ENCOUNTER — Ambulatory Visit: Payer: BLUE CROSS/BLUE SHIELD | Admitting: Rheumatology

## 2018-09-25 ENCOUNTER — Telehealth: Payer: Self-pay | Admitting: Pharmacist

## 2018-09-25 ENCOUNTER — Encounter: Payer: Self-pay | Admitting: Physician Assistant

## 2018-09-25 VITALS — BP 139/90 | HR 67 | Resp 14 | Ht 70.0 in | Wt 193.0 lb

## 2018-09-25 DIAGNOSIS — Z79899 Other long term (current) drug therapy: Secondary | ICD-10-CM | POA: Diagnosis not present

## 2018-09-25 DIAGNOSIS — R200 Anesthesia of skin: Secondary | ICD-10-CM

## 2018-09-25 DIAGNOSIS — I10 Essential (primary) hypertension: Secondary | ICD-10-CM

## 2018-09-25 DIAGNOSIS — M25411 Effusion, right shoulder: Secondary | ICD-10-CM | POA: Diagnosis not present

## 2018-09-25 DIAGNOSIS — B360 Pityriasis versicolor: Secondary | ICD-10-CM

## 2018-09-25 DIAGNOSIS — M0609 Rheumatoid arthritis without rheumatoid factor, multiple sites: Secondary | ICD-10-CM

## 2018-09-25 DIAGNOSIS — R202 Paresthesia of skin: Secondary | ICD-10-CM

## 2018-09-25 DIAGNOSIS — M5030 Other cervical disc degeneration, unspecified cervical region: Secondary | ICD-10-CM | POA: Diagnosis not present

## 2018-09-25 DIAGNOSIS — R5383 Other fatigue: Secondary | ICD-10-CM

## 2018-09-25 NOTE — Telephone Encounter (Signed)
Received a Prior Authorization request for HUMIRA. Authorization has been submitted to patient's insurance via Cover My Meds. Will update once we receive a response.  Key: [id number]

## 2018-09-25 NOTE — Telephone Encounter (Signed)
Patient seen in office today and still has swelling in his shoulder and joint pain.  He is currently on Enbrel and injectable methotrexate with an adequate response.  He will return to the office in 1 month to reassess medication regimen.  Considering Humira is another option.  We will start benefits investigation process.

## 2018-09-28 NOTE — Telephone Encounter (Signed)
PA previously submitted and approved so no PA required.

## 2018-10-04 ENCOUNTER — Encounter: Payer: Self-pay | Admitting: Rheumatology

## 2018-10-06 ENCOUNTER — Other Ambulatory Visit: Payer: Self-pay | Admitting: Rheumatology

## 2018-10-06 DIAGNOSIS — M0609 Rheumatoid arthritis without rheumatoid factor, multiple sites: Secondary | ICD-10-CM

## 2018-10-06 NOTE — Telephone Encounter (Signed)
Last Visit: 09/25/18 Next Visit: 10/23/18 Labs: 08/14/18 stable TB Gold: 03/17/18 Neg   Okay to refill per Dr. Estanislado Pandy

## 2018-10-07 ENCOUNTER — Encounter: Payer: Self-pay | Admitting: Rheumatology

## 2018-10-09 NOTE — Progress Notes (Signed)
Office Visit Note  Patient: Benjamin Fields             Date of Birth: Dec 22, 1960           MRN: [id number]             PCP: Lennie Odor, PA-C Referring: Lennie Odor, PA-C Visit Date: 10/23/2018 Occupation: @GUAROCC @  Subjective:  Pain in multiple joints.   Enbrel mini 50 mg weekly, Otrexup 20 mg weekly along with folic acid 2 mg daily.  Last TB gold negative on 03/17/18.  Most recent CBC/CMP stable on 08/14/18 and will monitor every 3 months. Standing orders placed.   History of Present Illness: Benjamin Fields is a 58 y.o. male with history of seronegative rheumatoid arthritis.  He states he has been not been doing well in the last 2 to 3 weeks he has been having increased pain and swelling.  He has noticed effusion in his right shoulder joint has increased.  He has been also having increased pain in his hands and knee joints.  He also has discomfort in his right hip.  He was traveling and did not want to switch to Humira at this time.  He is ready to switch to Humira now.  Last Enbrel injection was on October 17, 2018.  Activities of Daily Living:  Patient reports morning stiffness for 1 hour.   Patient Reports nocturnal pain.  Difficulty dressing/grooming: Denies Difficulty climbing stairs: Denies Difficulty getting out of chair: Reports Difficulty using hands for taps, buttons, cutlery, and/or writing: Denies  Review of Systems  Constitutional: Positive for fatigue. Negative for night sweats.  HENT: Negative for mouth sores, mouth dryness and nose dryness.   Eyes: Positive for redness and dryness. Negative for itching.  Respiratory: Negative for shortness of breath, wheezing and difficulty breathing.   Cardiovascular: Negative for chest pain, palpitations, hypertension, irregular heartbeat and swelling in legs/feet.  Gastrointestinal: Negative for abdominal pain, constipation and diarrhea.  Endocrine: Negative for increased urination.  Genitourinary: Negative for painful  urination.  Musculoskeletal: Positive for arthralgias, joint pain, joint swelling and morning stiffness. Negative for myalgias, muscle weakness, muscle tenderness and myalgias.  Skin: Positive for rash. Negative for color change, hair loss, nodules/bumps, skin tightness, ulcers and sensitivity to sunlight.  Allergic/Immunologic: Negative for susceptible to infections.  Neurological: Positive for headaches. Negative for dizziness, fainting, light-headedness, memory loss, night sweats and weakness.  Hematological: Negative for swollen glands.  Psychiatric/Behavioral: Positive for sleep disturbance. Negative for depressed mood and confusion. The patient is not nervous/anxious.     PMFS History:  Patient Active Problem List   Diagnosis Date Noted  . Rheumatoid arthritis of multiple sites with negative rheumatoid factor ([place]) 05/19/2018  . Tinea versicolor 05/19/2018  . Essential hypertension 03/17/2018  . DDD (degenerative disc disease), cervical 03/16/2018    Past Medical History:  Diagnosis Date  . Hypertension   . RA (rheumatoid arthritis) (HCC)     Family History  Problem Relation Age of Onset  . Parkinson's disease Mother   . Prostate cancer Mother   . Atrial fibrillation Mother   . Heart failure Father   . Hypertension Father    Past Surgical History:  Procedure Laterality Date  . CERVICAL SPINE SURGERY  2010  . TONSILLECTOMY     Social History   Social History Narrative   Lives home with wife.  workd at [employer]  Education: Masters.  Children 2.  Caffeine 2 cups daily   Immunization History  Administered  Date(s) Administered  . Influenza,inj,Quad PF,6+ Mos 07/06/2016     Objective: Vital Signs: BP 131/79   Pulse 68   Resp 15   Ht 5\' 10"  (1.778 m)   Wt 193 lb (87.5 kg)   BMI 27.69 kg/m    Physical Exam Vitals signs and nursing note reviewed.  Constitutional:      Appearance: He is well-developed.  HENT:     Head: Normocephalic and atraumatic.  Eyes:      Conjunctiva/sclera: Conjunctivae normal.     Pupils: Pupils are equal, round, and reactive to light.  Neck:     Musculoskeletal: Normal range of motion and neck supple.  Cardiovascular:     Rate and Rhythm: Normal rate and regular rhythm.     Heart sounds: Normal heart sounds.  Pulmonary:     Effort: Pulmonary effort is normal.     Breath sounds: Normal breath sounds.  Abdominal:     General: Bowel sounds are normal.     Palpations: Abdomen is soft.  Skin:    General: Skin is warm and dry.     Capillary Refill: Capillary refill takes less than 2 seconds.  Neurological:     Mental Status: He is alert and oriented to person, place, and time.  Psychiatric:        Behavior: Behavior normal.      Musculoskeletal Exam: C-spine thoracic and lumbar spine good range of motion.  He had painful range of motion of his right shoulder joint with swelling.  Left shoulder joint was in good range of motion.  He has swelling over his left elbow joint.  Wrist joint MCPs PIPs been good range of motion.  Hip joints knee joints ankles MTPs PIPs been good range of motion.  CDAI Exam: CDAI Score: 11.6  Patient Global Assessment: 8 (mm); Provider Global Assessment: 8 (mm) Swollen: 2 ; Tender: 9  Joint Exam      Right  Left  Glenohumeral  Swollen Tender     Elbow     Swollen Tender  Wrist   Tender   Tender  MCP 2   Tender   Tender  MCP 3   Tender   Tender  Hip   Tender        Investigation: No additional findings.  Imaging: No results found.  Recent Labs: Lab Results  Component Value Date   WBC 7.1 08/14/2018   HGB 15.3 08/14/2018   PLT 241 08/14/2018   NA 145 08/14/2018   K 4.6 08/14/2018   CL 108 08/14/2018   CO2 29 08/14/2018   GLUCOSE 94 08/14/2018   BUN 14 08/14/2018   CREATININE 1.25 08/14/2018   BILITOT 0.7 08/14/2018   AST 20 08/14/2018   ALT 44 08/14/2018   PROT 6.2 08/14/2018   CALCIUM 10.1 08/14/2018   GFRAA 73 08/14/2018   QFTBGOLDPLUS NEGATIVE 03/17/2018     Speciality Comments: Prior therapy: Enbrel (inadequate reponse)  Procedures:  Large Joint Inj: R glenohumeral on 10/23/2018 11:08 AM Indications: pain Details: 27 G 1.5 in needle, posterior approach  Arthrogram: No  Medications: 1 mL lidocaine 1 %; 40 mg triamcinolone acetonide 40 MG/ML Aspirate: 0 mL Outcome: tolerated well, no immediate complications Procedure, treatment alternatives, risks and benefits explained, specific risks discussed. Consent was given by the patient. Immediately prior to procedure a time out was called to verify the correct patient, procedure, equipment, support staff and site/side marked as required. Patient was prepped and draped in the usual sterile fashion.  Allergies: Cortisone   Assessment / Plan:     Visit Diagnoses: Rheumatoid arthritis of multiple sites with negative rheumatoid factor (HCC) - RF-, CCP-, 14-3-3 eta negative -patient states that his symptoms are getting worse and had been experiencing increased pain and discomfort in the last 2 to 3 weeks.  He had pain and discomfort in multiple joints and effusion in his right shoulder joint with warmth and discomfort.  He also had painful range of motion of her right hip joint.  He had tenderness in multiple joints as described above.  He had an adequate response to Otrexup and Enbrel combination.  We discussed Humira during the last visit but he could not start on it as he was traveling.  He is ready to start on Humira today.  His last Enbrel injection was a week ago.  He will discontinue Enbrel.  First injection of Humira was given in the office today.  He was served in the office with a 30 minutes.  And no adverse effects are noted.  Plan: Adalimumab (HUMIRA PEN) 40 MG/0.4ML PNKT  High risk medication use - Enbrel and Otrexup 25 mg sq once weekly, folic acid 2 mg discussed Humira at last visit.  His labs have been stable.  We will check labs in 1 month and then every 3 months to monitor for drug  toxicity after Humira injection.  Effusion of right shoulder-he had mild effusion and a lot of swelling in his right shoulder today.  After informed consent was obtained right shoulder joint was injected with lidocaine and Kenalog as described above.  He tolerated the procedure well.  DDD (degenerative disc disease), cervical-he has some chronic discomfort.  Other fatigue-he has been experiencing increased fatigue due to increased joint pain and discomfort.  Tinea versicolor-he had no evidence of tinea versicolor although he had some fine vesicular rash on his back which appeared to be miliaria.  I believe is because of the flannel sheets he has been using at nighttime.  Essential hypertension-his blood pressure is well controlled  Orders: Orders Placed This Encounter  Procedures  . Large Joint Inj: R glenohumeral   Meds ordered this encounter  Medications  . Adalimumab (HUMIRA PEN) 40 MG/0.4ML PNKT    Sig: Inject 40 mg into the skin every 14 (fourteen) days.    Dispense:  1 each    Refill:  0    1 kits-2 pens  . Adalimumab (HUMIRA PEN) 40 MG/0.4ML PNKT    Sig: Inject 40 mg into the skin every 14 (fourteen) days.    Dispense:  1 each    Refill:  0    Face-to-face time spent with patient was 30 minutes. Greater than 50% of time was spent in counseling and coordination of care.  Follow-Up Instructions: Return in about 3 months (around 01/21/2019) for Rheumatoid arthritis.   Benjamin Merino, MD  Note - This record has been created using Editor, commissioning.  Chart creation errors have been sought, but may not always  have been located. Such creation errors do not reflect on  the standard of medical care.

## 2018-10-15 ENCOUNTER — Encounter: Payer: BLUE CROSS/BLUE SHIELD | Admitting: Diagnostic Neuroimaging

## 2018-10-23 ENCOUNTER — Ambulatory Visit: Payer: BLUE CROSS/BLUE SHIELD | Admitting: Rheumatology

## 2018-10-23 ENCOUNTER — Encounter: Payer: Self-pay | Admitting: Physician Assistant

## 2018-10-23 VITALS — BP 131/79 | HR 68 | Resp 15 | Ht 70.0 in | Wt 193.0 lb

## 2018-10-23 DIAGNOSIS — M5030 Other cervical disc degeneration, unspecified cervical region: Secondary | ICD-10-CM | POA: Diagnosis not present

## 2018-10-23 DIAGNOSIS — B360 Pityriasis versicolor: Secondary | ICD-10-CM

## 2018-10-23 DIAGNOSIS — M25411 Effusion, right shoulder: Secondary | ICD-10-CM

## 2018-10-23 DIAGNOSIS — M0609 Rheumatoid arthritis without rheumatoid factor, multiple sites: Secondary | ICD-10-CM | POA: Diagnosis not present

## 2018-10-23 DIAGNOSIS — R5383 Other fatigue: Secondary | ICD-10-CM

## 2018-10-23 DIAGNOSIS — Z79899 Other long term (current) drug therapy: Secondary | ICD-10-CM

## 2018-10-23 DIAGNOSIS — I10 Essential (primary) hypertension: Secondary | ICD-10-CM

## 2018-10-23 MED ORDER — LIDOCAINE HCL 1 % IJ SOLN
1.0000 mL | INTRAMUSCULAR | Status: AC | PRN
  Administered 2018-10-23: 1 mL

## 2018-10-23 MED ORDER — TRIAMCINOLONE ACETONIDE 40 MG/ML IJ SUSP
40.0000 mg | INTRAMUSCULAR | Status: AC | PRN
  Administered 2018-10-23: 40 mg via INTRA_ARTICULAR

## 2018-10-23 MED ORDER — ADALIMUMAB 40 MG/0.4ML ~~LOC~~ AJKT: 40.0000 mg | AUTO-INJECTOR | SUBCUTANEOUS | 0 refills | Status: DC

## 2018-10-23 NOTE — Patient Instructions (Addendum)
Your next dose of Humira will be 3/13.  Standing Labs We placed an order today for your standing lab work.    Please come back and get your standing labs in 1 month and then every 3 months.  We have open lab Monday through Friday from 8:30-11:30 AM and 1:30-4:00 PM  at the office of Dr. Bo Merino.   You may experience shorter wait times on Monday and Friday afternoons. The office is located at [address] [place], [place], [place] [address] No appointment is necessary.   Labs are drawn by [facility].  You may receive a bill from [facility] for your lab work.  If you wish to have your labs drawn at another location, please call the office 24 hours in advance to send orders.  If you have any questions regarding directions or hours of operation,  please call [phone].   Just as a reminder please drink plenty of water prior to coming for your lab work. Thanks!  In addition to staying up to date on lab work and vaccines it is important to:  Marland Kitchen Yearly skin checks with dermatologist due to increase risk in skin cancer with TNF inhibitors .   Adalimumab Injection What is this medicine? ADALIMUMAB (a dal AYE mu mab) is used to treat rheumatoid and psoriatic arthritis. It is also used to treat ankylosing spondylitis, Crohn's disease, ulcerative colitis, plaque psoriasis, hidradenitis suppurativa, and uveitis. This medicine may be used for other purposes; ask your health care provider or pharmacist if you have questions. COMMON BRAND NAME(S): CYLTEZO, Humira What should I tell my health care provider before I take this medicine? They need to know if you have any of these conditions: -diabetes -heart disease -hepatitis B or history of hepatitis B infection -immune system problems -infection or history of infections -multiple sclerosis -recently received or scheduled to receive a vaccine -scheduled to have surgery -tuberculosis, a positive skin test for tuberculosis or have  recently been in close contact with someone who has tuberculosis -an unusual reaction to adalimumab, other medicines, mannitol, latex, rubber, foods, dyes, or preservatives -pregnant or trying to get pregnant -breast-feeding How should I use this medicine? This medicine is for injection under the skin. You will be taught how to prepare and give this medicine. Use exactly as directed. Take your medicine at regular intervals. Do not take your medicine more often than directed. A special MedGuide will be given to you by the pharmacist with each prescription and refill. Be sure to read this information carefully each time. It is important that you put your used needles and syringes in a special sharps container. Do not put them in a trash can. If you do not have a sharps container, call your pharmacist or healthcare provider to get one. Talk to your pediatrician regarding the use of this medicine in children. While this drug may be prescribed for children as young as 2 years for selected conditions, precautions do apply. The manufacturer of the medicine offers free information to patients and their health care partners. Call 986-023-7861 for more information. Overdosage: If you think you have taken too much of this medicine contact a poison control center or emergency room at once. NOTE: This medicine is only for you. Do not share this medicine with others. What if I miss a dose? If you miss a dose, take it as soon as you can. If it is almost time for your next dose, take only that dose. Do not take double or extra  doses. Give the next dose when your next scheduled dose is due. Call your doctor or health care professional if you are not sure how to handle a missed dose. What may interact with this medicine? Do not take this medicine with any of the following medications: -abatacept -anakinra -etanercept -infliximab -live virus vaccines -rilonacept This medicine may also interact with the  following medications: -vaccines This list may not describe all possible interactions. Give your health care provider a list of all the medicines, herbs, non-prescription drugs, or dietary supplements you use. Also tell them if you smoke, drink alcohol, or use illegal drugs. Some items may interact with your medicine. What should I watch for while using this medicine? Visit your doctor or health care professional for regular checks on your progress. Tell your doctor or healthcare professional if your symptoms do not start to get better or if they get worse. You will be tested for tuberculosis (TB) before you start this medicine. If your doctor prescribes any medicine for TB, you should start taking the TB medicine before starting this medicine. Make sure to finish the full course of TB medicine. Call your doctor or health care professional if you get a cold or other infection while receiving this medicine. Do not treat yourself. This medicine may decrease your body's ability to fight infection. Talk to your doctor about your risk of cancer. You may be more at risk for certain types of cancers if you take this medicine. What side effects may I notice from receiving this medicine? Side effects that you should report to your doctor or health care professional as soon as possible: -allergic reactions like skin rash, itching or hives, swelling of the face, lips, or tongue -breathing problems -changes in vision -chest pain -fever, chills, or any other sign of infection -numbness or tingling -red, scaly patches or raised bumps on the skin -swelling of the ankles -swollen lymph nodes in the neck, underarm, or groin areas -unexplained weight loss -unusual bleeding or bruising -unusually weak or tired Side effects that usually do not require medical attention (report to your doctor or health care professional if they continue or are bothersome): -headache -nausea -redness, itching, swelling, or  bruising at site where injected This list may not describe all possible side effects. Call your doctor for medical advice about side effects. You may report side effects to FDA at 1-800-FDA-1088. Where should I keep my medicine? Keep out of the reach of children. Store in the original container and in the refrigerator between 2 and 8 degrees C (36 and 46 degrees F). Do not freeze. The product may be stored in a cool carrier with an ice pack, if needed. Protect from light. Throw away any unused medicine after the expiration date. NOTE: This sheet is a summary. It may not cover all possible information. If you have questions about this medicine, talk to your doctor, pharmacist, or health care provider.  2019 Elsevier/Gold Standard (2015-03-01 11:11:43)

## 2018-10-23 NOTE — Progress Notes (Signed)
Pharmacy Note Subjective: Patient presents today to the [facility] to see Dr. Estanislado Pandy.   Patient seen by the pharmacist for counseling on Humira for rheumatoid arthritis. He was accompanied by his wife who administers his medications.  He is currently on injectable methotrexate and Enbrel with inadequate response.  Objective:  CBC    Component Value Date/Time   WBC 7.1 08/14/2018 0952   RBC 4.46 08/14/2018 0952   HGB 15.3 08/14/2018 0952   HCT 42.2 08/14/2018 0952   PLT 241 08/14/2018 0952   MCV 94.6 08/14/2018 0952   MCH 34.3 (H) 08/14/2018 0952   MCHC 36.3 (H) 08/14/2018 0952   RDW 14.0 08/14/2018 0952   LYMPHSABS 1,711 08/14/2018 0952   EOSABS 632 (H) 08/14/2018 0952   BASOSABS 128 08/14/2018 0952     CMP     Component Value Date/Time   NA 145 08/14/2018 0952   K 4.6 08/14/2018 0952   CL 108 08/14/2018 0952   CO2 29 08/14/2018 0952   GLUCOSE 94 08/14/2018 0952   BUN 14 08/14/2018 0952   CREATININE 1.25 08/14/2018 0952   CALCIUM 10.1 08/14/2018 0952   PROT 6.2 08/14/2018 0952   AST 20 08/14/2018 0952   ALT 44 08/14/2018 0952   BILITOT 0.7 08/14/2018 0952   GFRNONAA 63 08/14/2018 0952   GFRAA 73 08/14/2018 0952     Quantiferon TB Gold Latest Ref Rng & Units 03/17/2018  Quantiferon TB Gold Plus NEGATIVE NEGATIVE    Hepatitis Latest Ref Rng & Units 03/17/2018  Hep B Surface Ag NON-REACTI NON-REACTIVE  Hep B IgM NON-REACTI NON-REACTIVE  Hep C Ab NON-REACTI NON-REACTIVE  Hep C Ab NON-REACTI NON-REACTIVE    Lab Results  Component Value Date   HIV NON-REACTIVE 03/17/2018    Immunoglobulin Electrophoresis Latest Ref Rng & Units 03/17/2018  IgA  47 - 310 mg/dL 93  IgG 600 - 1,640 mg/dL 548(L)  IgM 50 - 300 mg/dL 78    Serum Protein Electrophoresis Latest Ref Rng & Units 08/14/2018  Total Protein 6.1 - 8.1 g/dL 6.2  Albumin 3.8 - 4.8 g/dL -  Alpha-1 0.2 - 0.3 g/dL -  Alpha-2 0.5 - 0.9 g/dL -  Beta Globulin 0.4 - 0.6 g/dL -  Beta 2 0.2 - 0.5  g/dL -  Gamma Globulin 0.8 - 1.7 g/dL -    Lab Results  Component Value Date   G6PDH 13.9 03/17/2018    No results found for: TPMT   Chest x-ray: no acute findings 01/06/06  Does patient have diagnosis of heart failure?  No  Assessment/Plan:  Counseled patient that Humira is a TNF blocking agent.  Counseled patient on purpose, proper use, and adverse effects of Humira.  Reviewed the most common adverse effects including infections, headache, and injection site reactions. Discussed that there is the possibility of an increased risk of malignancy but it is not well understood if this increased risk is due to the medication or the disease state.  Advised patient to get yearly dermatology exams due to risk of skin cancer. Counseled patient that Humira should be held prior to scheduled surgery.  Counseled patient to avoid live vaccines while on Humira.  Advised patient to get annual influenza vaccine and the pneumococcal vaccine as indicated.    Reviewed the importance of regular labs while on Humira therapy.  Standing orders placed.  Provided patient with medication education material and answered all questions.  Patient consented to Humira.  Will upload consent into the media tab.  Reviewed storage instructions of Humira.  Advised initial injection must be administered in office.  Patient verbalized understanding.  Demonstrated proper injection technique with Humira demo pen.  Patient able to demonstrate proper injection technique using the teach back method.  Patient self injected in the left anterior thigh with:  Sample Medication: Humira 40mg /0.44ml pen NDC: 6222-9798-92 Lot: [id number] Expiration: 12/25/19  Patient tolerated well.  Observed for 30 mins in office for adverse reaction and none noted. Instructed patient to call with any questions/issues.    Dose will be for rheumatoid arthritis Humira 40 mg every 14 days. Humira was approved through insurance and prescription sent to [facility].   Rachael, patient advocate, helped set up co-pay assistance and scheduled first delivery for 3/4.   All questions encouraged and answered.  Instructed patient to call with any further questions or concerns.  Mariella Saa, PharmD, [facility] Rheumatology Clinical Pharmacist  10/23/2018 11:33 AM

## 2018-10-27 ENCOUNTER — Telehealth: Payer: Self-pay | Admitting: Rheumatology

## 2018-10-27 NOTE — Telephone Encounter (Signed)
Pam from [facility] returned your call stating patient had an office visit in January, but they did not do a biopsy.  Pam states she will fax the office note from his last appointment.  If you have any questions, please call back at [phone] ext [phone]

## 2018-10-28 ENCOUNTER — Encounter: Payer: Self-pay | Admitting: Rheumatology

## 2018-10-28 DIAGNOSIS — M0609 Rheumatoid arthritis without rheumatoid factor, multiple sites: Secondary | ICD-10-CM

## 2018-10-28 NOTE — Telephone Encounter (Signed)
Okay to refer to ophthalmology

## 2018-10-29 ENCOUNTER — Telehealth: Payer: Self-pay | Admitting: *Deleted

## 2018-10-29 NOTE — Telephone Encounter (Signed)
Patient contacted the office and left message requesting a call back regarding referral to opthalmology.   Attempted to contact the patient and left message for patient to call the office.

## 2018-10-30 ENCOUNTER — Telehealth: Payer: Self-pay | Admitting: Rheumatology

## 2018-10-30 ENCOUNTER — Encounter: Payer: Self-pay | Admitting: Rheumatology

## 2018-10-30 NOTE — Telephone Encounter (Signed)
Callus from [facility] called stating they scheduled patient for appointment on Monday 11/02/18 at 3:45 pm.  Callus states they are having trouble with their fax machine and didn't receive the referral.  Please fax to [phone] Attn Callus  If you have any questions, please call back at [phone]

## 2018-10-30 NOTE — Telephone Encounter (Signed)
Patient's wife states she was going to give Benjamin Fields his Otrexup this morning and the device did not trigger. She states when she went to put it in the sharps container it sprayed in her face, getting into her eye and on her face. Patient's wife states she did a quick wash of her face and eyes. Patient wife states she is having some burning and itching. Patient wife states she has spoke with the [employer] and Poison control. She was advised to wash her eyes for 10 minutes along with her face. She would also like to know if she should use a new pen to give Benjamin Fields his medication for thei week or wait until next week.

## 2018-10-30 NOTE — Telephone Encounter (Signed)
He should not inject another methotrexate this week.  He should take next methotrexate as a scheduled.

## 2018-10-30 NOTE — Telephone Encounter (Signed)
Referral re faxed.

## 2018-11-02 DIAGNOSIS — H10413 Chronic giant papillary conjunctivitis, bilateral: Secondary | ICD-10-CM | POA: Diagnosis not present

## 2018-11-04 ENCOUNTER — Encounter: Payer: Self-pay | Admitting: Rheumatology

## 2018-11-06 ENCOUNTER — Other Ambulatory Visit: Payer: Self-pay | Admitting: Rheumatology

## 2018-11-06 NOTE — Telephone Encounter (Signed)
Last Visit: 10/23/18 Next Visit: 01/22/19 Labs: 08/14/18 stable  Okay to refill per Dr. Estanislado Pandy

## 2018-11-09 MED ORDER — METHOTREXATE (PF) 25 MG/0.4ML ~~LOC~~ SOAJ: SUBCUTANEOUS | 0 refills | Status: DC

## 2018-11-09 NOTE — Addendum Note (Signed)
Addended by: Carole Binning on: 11/09/2018 05:00 PM   Modules accepted: Orders

## 2018-11-12 ENCOUNTER — Telehealth: Payer: Self-pay | Admitting: *Deleted

## 2018-11-12 ENCOUNTER — Encounter: Payer: Self-pay | Admitting: Rheumatology

## 2018-11-12 MED ORDER — TIZANIDINE HCL 4 MG PO TABS: 4.0000 mg | ORAL_TABLET | Freq: Every day | ORAL | 0 refills | Status: DC

## 2018-11-12 NOTE — Telephone Encounter (Signed)
Per Dr. Estanislado Pandy okay to send prescription for Zanaflex 4 mg po qhs.

## 2018-11-18 ENCOUNTER — Encounter: Payer: Self-pay | Admitting: Rheumatology

## 2018-11-20 ENCOUNTER — Encounter: Payer: Self-pay | Admitting: Rheumatology

## 2018-11-20 ENCOUNTER — Other Ambulatory Visit: Payer: Self-pay

## 2018-11-20 DIAGNOSIS — Z79899 Other long term (current) drug therapy: Secondary | ICD-10-CM | POA: Diagnosis not present

## 2018-11-20 LAB — COMPLETE METABOLIC PANEL WITH GFR
AG RATIO: 3.1 (calc) — AB (ref 1.0–2.5)
ALT: 42 U/L (ref 9–46)
AST: 19 U/L (ref 10–35)
Albumin: 4.7 g/dL (ref 3.6–5.1)
Alkaline phosphatase (APISO): 47 U/L (ref 35–144)
BUN / CREAT RATIO: 18 (calc) (ref 6–22)
BUN: 24 mg/dL (ref 7–25)
CO2: 27 mmol/L (ref 20–32)
Calcium: 9.5 mg/dL (ref 8.6–10.3)
Chloride: 106 mmol/L (ref 98–110)
Creat: 1.36 mg/dL — ABNORMAL HIGH (ref 0.70–1.33)
GFR, EST AFRICAN AMERICAN: 66 mL/min/{1.73_m2} (ref >=60)
GFR, EST NON AFRICAN AMERICAN: 57 mL/min/{1.73_m2} — AB (ref >=60)
GLOBULIN: 1.5 g/dL — AB (ref 1.9–3.7)
Glucose, Bld: 94 mg/dL (ref 65–99)
POTASSIUM: 4.5 mmol/L (ref 3.5–5.3)
SODIUM: 141 mmol/L (ref 135–146)
TOTAL PROTEIN: 6.2 g/dL (ref 6.1–8.1)
Total Bilirubin: 0.8 mg/dL (ref 0.2–1.2)

## 2018-11-20 LAB — CBC WITH DIFFERENTIAL/PLATELET
Absolute Monocytes: 498 cells/uL (ref 200–950)
BASOS ABS: 67 {cells}/uL (ref 0–200)
Basophils Relative: 1.2 %
EOS ABS: 358 {cells}/uL (ref 15–500)
Eosinophils Relative: 6.4 %
HCT: 45.5 % (ref 38.5–50.0)
HEMOGLOBIN: 15.9 g/dL (ref 13.2–17.1)
Lymphs Abs: 2005 cells/uL (ref 850–3900)
MCH: 32.7 pg (ref 27.0–33.0)
MCHC: 34.9 g/dL (ref 32.0–36.0)
MCV: 93.6 fL (ref 80.0–100.0)
MONOS PCT: 8.9 %
MPV: 10.4 fL (ref 7.5–12.5)
NEUTROS PCT: 47.7 %
Neutro Abs: 2671 cells/uL (ref 1500–7800)
PLATELETS: 194 10*3/uL (ref 140–400)
RBC: 4.86 10*6/uL (ref 4.20–5.80)
RDW: 14 % (ref 11.0–15.0)
TOTAL LYMPHOCYTE: 35.8 %
WBC: 5.6 10*3/uL (ref 3.8–10.8)

## 2018-11-21 ENCOUNTER — Other Ambulatory Visit: Payer: Self-pay | Admitting: Rheumatology

## 2018-11-21 DIAGNOSIS — M0609 Rheumatoid arthritis without rheumatoid factor, multiple sites: Secondary | ICD-10-CM

## 2018-11-21 NOTE — Progress Notes (Signed)
He should decrease Otrexup to 20 mg sq q wk. Avoid all NSAIDS.

## 2018-11-23 NOTE — Telephone Encounter (Signed)
Last Visit: 10/23/18 Next Visit: 01/22/19 Labs: 11/20/18 Creat. 1.36 GFR 57 Globulin, AG Ratio 3.1 TB Gold: 03/17/18 Neg   Okay to refill per Dr. Estanislado Pandy

## 2018-12-06 ENCOUNTER — Encounter: Payer: Self-pay | Admitting: Rheumatology

## 2018-12-07 MED ORDER — METHOTREXATE (PF) 20 MG/0.4ML ~~LOC~~ SOAJ: 20.0000 mg | SUBCUTANEOUS | 0 refills | Status: DC

## 2018-12-07 NOTE — Telephone Encounter (Signed)
Last Visit: 10/23/2018 Next Visit: 01/22/2019 Labs: 11/20/2018 He should decrease Otrexup to 20 mg sq q wk. Avoid all NSAIDS.  Okay to refill per Dr. Estanislado Pandy.

## 2018-12-17 ENCOUNTER — Encounter: Payer: BLUE CROSS/BLUE SHIELD | Admitting: Diagnostic Neuroimaging

## 2018-12-17 ENCOUNTER — Encounter

## 2018-12-21 ENCOUNTER — Other Ambulatory Visit: Payer: Self-pay | Admitting: Rheumatology

## 2018-12-21 NOTE — Telephone Encounter (Signed)
Last Visit: 10/23/2018 Next Visit: 01/22/2019  Okay to refill per Dr. Estanislado Pandy.

## 2019-01-07 ENCOUNTER — Encounter: Payer: Self-pay | Admitting: Rheumatology

## 2019-01-08 NOTE — Progress Notes (Signed)
Virtual Visit Note  Patient: Benjamin Fields             Date of Birth: 1961-01-09           MRN: [id number]             PCP: Benjamin Odor, PA-C Referring: Benjamin Odor, PA-C Visit Date: 01/22/2019 Occupation: @GUAROCC @  This service was conducted via virtual visit.  Both audio and visual tools were used.  The patient was located at home. I was located in my office.  Consent was obtained prior to the virtual visit and is aware of possible charges through their insurance for this visit.  The patient is an established patient.  Dr. Estanislado Pandy, MD conducted the virtual visit and Benjamin Sams, PA-C acted as scribe during the service.  Office staff helped with scheduling follow up visits after the service was conducted.    Subjective:  Pain in multiple joints   History of Present Illness: Benjamin Fields is a 58 y.o. male with history of seronegative rheumatoid arthritis and DDD.  He is on Humira 40 mg sq every 14 days, Otrexup 20 mg every 7 days, and folic acid 2 mg po daily.  He had an inadequate response to Enbrel in the past.  He has not noticed any improvement since switching from Enbrel to Humira.  He continues to have chronic right shoulder pain and swelling.  He has intermittent left knee joint pain and swelling.  He has been experiencing a burning pain in both feet and notices swelling at night.    Activities of Daily Living:  Patient reports morning stiffness for 1-2 hours.   Patient Reports nocturnal pain.  Difficulty dressing/grooming: Denies Difficulty climbing stairs: Reports Difficulty getting out of chair: Denies Difficulty using hands for taps, buttons, cutlery, and/or writing: Denies  Review of Systems  Constitutional: Positive for fatigue. Negative for night sweats.  HENT: Negative for mouth sores, mouth dryness and nose dryness.   Eyes: Negative for redness, visual disturbance and dryness.  Respiratory: Negative for cough, hemoptysis, shortness of breath and difficulty  breathing.   Cardiovascular: Negative for chest pain, palpitations, hypertension, irregular heartbeat and swelling in legs/feet.  Gastrointestinal: Negative for blood in stool, constipation and diarrhea.  Endocrine: Negative for increased urination.  Genitourinary: Negative for painful urination.  Musculoskeletal: Positive for arthralgias, joint pain, joint swelling, morning stiffness and muscle tenderness. Negative for myalgias, muscle weakness and myalgias.  Skin: Positive for rash (Tinea veriscolor ). Negative for color change, hair loss, nodules/bumps, skin tightness, ulcers and sensitivity to sunlight.  Allergic/Immunologic: Negative for susceptible to infections.  Neurological: Positive for parasthesias. Negative for dizziness, fainting, memory loss, night sweats and weakness.  Hematological: Negative for swollen glands.  Psychiatric/Behavioral: Positive for depressed mood. Negative for sleep disturbance. The patient is not nervous/anxious.     PMFS History:  Patient Active Problem List   Diagnosis Date Noted  . Rheumatoid arthritis of multiple sites with negative rheumatoid factor ([place]) 05/19/2018  . Tinea versicolor 05/19/2018  . Essential hypertension 03/17/2018  . DDD (degenerative disc disease), cervical 03/16/2018    Past Medical History:  Diagnosis Date  . Hypertension   . RA (rheumatoid arthritis) (H[facility])     Family History  Problem Relation Age of Onset  . Parkinson's disease Mother   . Prostate cancer Mother   . Atrial fibrillation Mother   . Heart failure Father   . Hypertension Father    Past Surgical History:  Procedure Laterality Date  .  CERVICAL SPINE SURGERY  2010  . TONSILLECTOMY     Social History   Social History Narrative   Lives home with wife.  workd at [employer]  Education: [employer].  Children 2.  Caffeine 2 cups daily   Immunization History  Administered Date(s) Administered  . Influenza,inj,Quad PF,6+ Mos 07/06/2016    Physical exam:   Physical Exam  Constitutional: He is oriented to person, place, and time and well-developed, well-nourished, and in no distress.  HENT:  Head: Normocephalic and atraumatic.  Eyes: Conjunctivae are normal.  Pulmonary/Chest: Effort normal.  Neurological: He is alert and oriented to person, place, and time.  Psychiatric: Mood, memory, affect and judgment normal.  C-spine good ROM with no discomfort   Imaging: No results found.  Recent Labs: Lab Results  Component Value Date   WBC 5.6 11/20/2018   HGB 15.9 11/20/2018   PLT 194 11/20/2018   NA 141 11/20/2018   K 4.5 11/20/2018   CL 106 11/20/2018   CO2 27 11/20/2018   GLUCOSE 94 11/20/2018   BUN 24 11/20/2018   CREATININE 1.36 (H) 11/20/2018   BILITOT 0.8 11/20/2018   AST 19 11/20/2018   ALT 42 11/20/2018   PROT 6.2 11/20/2018   CALCIUM 9.5 11/20/2018   GFRAA 66 11/20/2018   QFTBGOLDPLUS NEGATIVE 03/17/2018    Speciality Comments: Prior therapy: Enbrel (inadequate reponse)  Procedures:  No procedures performed Allergies: Cortisone      Assessment / Plan:     Visit Diagnoses: Rheumatoid arthritis of multiple sites with negative rheumatoid factor ([place]): He continues to have pain in multiple joints and intermittent joint swelling.  He has recurrent right shoulder and left knee joint effusions.  He is currently on Humira 40 mg sq injections every 14 days and Otrexup 20 mg every week.  His last Humira injection was 1 week ago.   He has not noticed any improvement since switching from Enbrel to Humira. We discussed switching him to Actemra.  Indications, contraindications, and potential side effects of Actemra were discussed.  All questions were addressed.  We will apply for Actemra, and he will come to the office for consent and the administration of the first injection. He will follow up in 3 months.  Medication counseling:  Chest x-ray: No acute findings on 01/07/2006 Counseled patient that Actemra is an IL-6 blocking agent.    Counseled patient on purpose, proper use, and adverse effects of Actemra.  Reviewed the most common adverse effects including infections, injection site reaction, bowel injury, and rarely cancer and conditions of the nervous system.  Reviewed that the medication should be held during infections.  Discussed that there is the possibility of an increased risk of malignancy but it is not well understood if this increased risk is due to the medication or the disease state.  Counseled patient that Actemra should be held prior to scheduled surgery.  Counseled patient to avoid live vaccines while on Actemra.  Recommend annual influenza, Pneumovax 23, Prevnar 13, and Shingrix as indicated.  Reviewed the importance of regular labs while on Actemra therapy including the need for routine lipid panel.  Advised patient to get standing labs one month after starting Actemra.  Provided patient with standing lab orders.    Provided patient with medication education material and answered all questions.  Patient voiced understanding.  Patient consented to Actemra.  Will upload consent into the media tab.  Reviewed storage instructions of Actemra with patient.  Advised patient that initial Actemra injection must be  given in the office.  Will apply for Actemra through patient's insurance.   Prescription pending lab results and/or insurance approval.  High risk medication use: We will apply for Actemra. Humira 40 mg every 14 days, Otrexup 20 mg every 7 days and folic acid 1 mg 2 tablets daily.  Last TB gold negative on 03/17/2018 and will monitor yearly.  Most recent CBC/CMP within normal limits except for slightly elevated creatinine and decreased GFR on 11/20/2018.  Will monitor CBC/CMP every 3 months and standing orders are in place.  He had a skin exam within the last year.  Effusion of right shoulder: He has recurrent right shoulder joint pain and effusions.  He has not noticed any improvement since switching from Enbrel to  Humira.   DDD (degenerative disc disease), cervical: He has good ROM with no discomfort.   Other fatigue: Chronic but stable.   Effusion of left knee: He has intermittent left knee joint pain and joint swelling.    Other medical conditions are listed as follows:   Essential hypertension  Numbness and tingling of both feet: He has an upcoming appointment with neurology.   Tinea versicolor   Orders: No orders of the defined types were placed in this encounter.  No orders of the defined types were placed in this encounter.   Face-to-face time spent with patient was 25 minutes. Greater than 50% of time was spent in counseling and coordination of care.  Follow-Up Instructions: He will follow up in 3 months.  Bo Merino, MD   Scribed by- Ofilia Neas, PA-C  Note - This record has been created using Dragon software.  Chart creation errors have been sought, but may not always  have been located. Such creation errors do not reflect on  the standard of medical care.

## 2019-01-22 ENCOUNTER — Other Ambulatory Visit: Payer: Self-pay

## 2019-01-22 ENCOUNTER — Encounter: Payer: Self-pay | Admitting: Rheumatology

## 2019-01-22 ENCOUNTER — Telehealth (INDEPENDENT_AMBULATORY_CARE_PROVIDER_SITE_OTHER): Payer: BLUE CROSS/BLUE SHIELD | Admitting: Rheumatology

## 2019-01-22 ENCOUNTER — Telehealth: Payer: Self-pay

## 2019-01-22 DIAGNOSIS — M0609 Rheumatoid arthritis without rheumatoid factor, multiple sites: Secondary | ICD-10-CM | POA: Diagnosis not present

## 2019-01-22 DIAGNOSIS — R5383 Other fatigue: Secondary | ICD-10-CM

## 2019-01-22 DIAGNOSIS — R202 Paresthesia of skin: Secondary | ICD-10-CM

## 2019-01-22 DIAGNOSIS — B360 Pityriasis versicolor: Secondary | ICD-10-CM

## 2019-01-22 DIAGNOSIS — M5030 Other cervical disc degeneration, unspecified cervical region: Secondary | ICD-10-CM

## 2019-01-22 DIAGNOSIS — M25462 Effusion, left knee: Secondary | ICD-10-CM

## 2019-01-22 DIAGNOSIS — Z79899 Other long term (current) drug therapy: Secondary | ICD-10-CM

## 2019-01-22 DIAGNOSIS — M25411 Effusion, right shoulder: Secondary | ICD-10-CM

## 2019-01-22 DIAGNOSIS — R200 Anesthesia of skin: Secondary | ICD-10-CM

## 2019-01-22 DIAGNOSIS — I10 Essential (primary) hypertension: Secondary | ICD-10-CM

## 2019-01-22 NOTE — Telephone Encounter (Signed)
Per Hazel Sams, PA-C, can you please apply for actemra?

## 2019-01-22 NOTE — Telephone Encounter (Signed)
Received a Prior Authorization request from [facility] for Benjamin Fields. Authorization has been submitted to patient's insurance via Cover My Meds. Will update once we receive a response.

## 2019-01-26 ENCOUNTER — Encounter: Payer: Self-pay | Admitting: Rheumatology

## 2019-01-26 DIAGNOSIS — R7309 Other abnormal glucose: Secondary | ICD-10-CM | POA: Diagnosis not present

## 2019-01-26 DIAGNOSIS — I10 Essential (primary) hypertension: Secondary | ICD-10-CM | POA: Diagnosis not present

## 2019-01-27 DIAGNOSIS — R7303 Prediabetes: Secondary | ICD-10-CM | POA: Diagnosis not present

## 2019-01-27 DIAGNOSIS — I10 Essential (primary) hypertension: Secondary | ICD-10-CM | POA: Diagnosis not present

## 2019-01-27 NOTE — Telephone Encounter (Addendum)
Left voicemail to discuss.  Asked to call me back at my desk [phone].

## 2019-01-27 NOTE — Telephone Encounter (Signed)
Received a fax from [facility] regarding a prior authorization for [place]. Authorization has been APPROVED from 01/22/2019 to 01/20/2022.   Will send document to scan center.  Authorization # [id number]   Left voicemail notifying patient of approval and to schedule new start appointment.  Mariella Saa, PharmD, [place], CPP Rheumatology Clinical Pharmacist  01/27/2019 9:00 AM

## 2019-01-27 NOTE — Telephone Encounter (Signed)
Patient returned my call.  Informed him that Actemra has been approved.  He does not need a lipid panel prior to starting Actemra but must be 2 weeks after last Humira dose.  Last dose of Humira was on 01/15/2019.  He was scheduled for his new start appointment on Tuesday  June 9 at 9 AM.  Patient verbalized understanding.  He went to his PCP today and they obtained a lipid panel.  They will be faxing the results to [place].  All questions encouraged and answered.  Instructed patient to call with any further questions or concerns.  Mariella Saa, PharmD, [facility] Rheumatology Clinical Pharmacist  01/27/2019 4:17 PM

## 2019-01-28 ENCOUNTER — Encounter (INDEPENDENT_AMBULATORY_CARE_PROVIDER_SITE_OTHER): Payer: BLUE CROSS/BLUE SHIELD | Admitting: Diagnostic Neuroimaging

## 2019-01-28 ENCOUNTER — Ambulatory Visit (INDEPENDENT_AMBULATORY_CARE_PROVIDER_SITE_OTHER): Payer: BC Managed Care – PPO | Admitting: Diagnostic Neuroimaging

## 2019-01-28 ENCOUNTER — Other Ambulatory Visit: Payer: Self-pay

## 2019-01-28 DIAGNOSIS — R200 Anesthesia of skin: Secondary | ICD-10-CM | POA: Diagnosis not present

## 2019-01-28 DIAGNOSIS — R292 Abnormal reflex: Secondary | ICD-10-CM

## 2019-01-28 DIAGNOSIS — Z0289 Encounter for other administrative examinations: Secondary | ICD-10-CM

## 2019-02-02 ENCOUNTER — Ambulatory Visit (INDEPENDENT_AMBULATORY_CARE_PROVIDER_SITE_OTHER): Payer: BC Managed Care – PPO | Admitting: Pharmacist

## 2019-02-02 ENCOUNTER — Other Ambulatory Visit: Payer: Self-pay

## 2019-02-02 VITALS — BP 133/76 | HR 71

## 2019-02-02 DIAGNOSIS — M0609 Rheumatoid arthritis without rheumatoid factor, multiple sites: Secondary | ICD-10-CM | POA: Diagnosis not present

## 2019-02-02 MED ORDER — TOCILIZUMAB 162 MG/0.9ML ~~LOC~~ SOAJ: 162.0000 mg | SUBCUTANEOUS | 0 refills | Status: DC

## 2019-02-02 MED ORDER — TOCILIZUMAB 162 MG/0.9ML ~~LOC~~ SOAJ: 162.0000 mg | SUBCUTANEOUS | 2 refills | Status: DC

## 2019-02-02 NOTE — Progress Notes (Signed)
Pharmacy Note Subjective: Patient presents today to the [facility] to be seen by the pharmacist for counseling and injection training on Actemra for rheumatoid arthritis.  Prior therapy includes methotrexate, Humira, and Enbrel with inadequate response.  He denies feeling sick today or running a fever.  Objective: CBC    Component Value Date/Time   WBC 5.6 11/20/2018 0915   RBC 4.86 11/20/2018 0915   HGB 15.9 11/20/2018 0915   HCT 45.5 11/20/2018 0915   PLT 194 11/20/2018 0915   MCV 93.6 11/20/2018 0915   MCH 32.7 11/20/2018 0915   MCHC 34.9 11/20/2018 0915   RDW 14.0 11/20/2018 0915   LYMPHSABS 2,005 11/20/2018 0915   EOSABS 358 11/20/2018 0915   BASOSABS 67 11/20/2018 0915    CMP     Component Value Date/Time   NA 141 11/20/2018 0915   K 4.5 11/20/2018 0915   CL 106 11/20/2018 0915   CO2 27 11/20/2018 0915   GLUCOSE 94 11/20/2018 0915   BUN 24 11/20/2018 0915   CREATININE 1.36 (H) 11/20/2018 0915   CALCIUM 9.5 11/20/2018 0915   PROT 6.2 11/20/2018 0915   AST 19 11/20/2018 0915   ALT 42 11/20/2018 0915   BILITOT 0.8 11/20/2018 0915   GFRNONAA 57 (L) 11/20/2018 0915   GFRAA 66 11/20/2018 0915     Baseline Immunosuppressant Therapy Labs  Quantiferon TB Gold Latest Ref Rng & Units 03/17/2018  Quantiferon TB Gold Plus NEGATIVE NEGATIVE    Hepatitis Latest Ref Rng & Units 03/17/2018  Hep B Surface Ag NON-REACTI NON-REACTIVE  Hep B IgM NON-REACTI NON-REACTIVE  Hep C Ab NON-REACTI NON-REACTIVE  Hep C Ab NON-REACTI NON-REACTIVE    Lab Results  Component Value Date   HIV NON-REACTIVE 03/17/2018    Immunoglobulin Electrophoresis Latest Ref Rng & Units 03/17/2018  IgA  47 - 310 mg/dL 93  IgG 600 - 1,640 mg/dL 548(L)  IgM 50 - 300 mg/dL 78    Serum Protein Electrophoresis Latest Ref Rng & Units 11/20/2018  Total Protein 6.1 - 8.1 g/dL 6.2  Albumin 3.8 - 4.8 g/dL -  Alpha-1 0.2 - 0.3 g/dL -  Alpha-2 0.5 - 0.9 g/dL -  Beta Globulin 0.4 - 0.6 g/dL -   Beta 2 0.2 - 0.5 g/dL -  Gamma Globulin 0.8 - 1.7 g/dL -    Lab Results  Component Value Date   G6PDH 13.9 03/17/2018    No results found for: TPMT   Lipid Panel No results found for: CHOL, HDL, LDLCALC, LDLDIRECT, TRIG, CHOLHDL  Results faxed from PCP and within normal limits  Chest x-ray: no acute findings 01/06/2006  Assessment/Plan:  Counseled patient that Actemra is an IL-6 blocking agent. Counseled patient on purpose, proper use, and adverse effects of Actemra.  Reviewed the most common adverse effects including infections, injection site reaction, bowel injury, and rarely cancer and conditions of the nervous system.  Reviewed that the medication should be held during infections. Counseled patient that Actemra should be held prior to scheduled surgery. Discussed that there is the possibility of an increased risk of malignancy but it is not well understood if this increased risk is due to the medication or the disease state.  Counseled patient to avoid live vaccines while on Actemra. Recommend annual influenza, Pneumovax 23, Prevnar 13, and Shingrix as indicated.   Reviewed the importance of regular labs while on Actemra therapy including the need for routine lipid panel.  Advised patient to get standing labs one month after starting  Actemra.  Provided patient with standing lab orders.  Provided patient with medication education material and answered all questions.  Patient voiced understanding.  Patient consented to Actemra.  Will upload consent into the media tab.  Reviewed storage instructions of Actemra with patient.  Advised patient that initial Actemra injection must be given in the office.  Will apply for Actemra through patient's insurance.    Patient dose will be  162 mg every 14 days based on weight <100 kg .  Prescription sent to [facility].  He will continue on Otrexup 20 mg every 7 days and folic acid 1 mg 2 tablets daily.    He was given Actemra co-pay card to help with co-pay  cost.  Demonstrated proper injection technique with Actemra demo pen.  Patient able to demonstrate proper injection technique using the teach back method. Patient self injected in the lower abdomen with:  Sample Medication: Actemra ACTPen 162mg /0.106ml NDC: 00923-300-76 Lot: [id number] Expiration: 05/2019  Patient tolerated well.  Observed for 30 mins in office for adverse reaction and none noted. Instructed patient to call with any questions/issues.    All questions encouraged and answered.  Instructed patient to call with any further questions or concerns.  Mariella Saa, PharmD, [facility] Rheumatology Clinical Pharmacist  02/02/2019 10:15 AM

## 2019-02-02 NOTE — Patient Instructions (Signed)
Since you want to inject on Fridays, your next dose will be on June 26th then every 14 days.  Standing Labs We placed an order today for your standing lab work.    Please come back and get your standing labs in 1 month and then every 3 months.  We have open lab daily Monday through Thursday from 8:30-12:30 PM and 1:30-4:30 PM and Friday from 8:30-12:30 PM and 1:30 -4:00 PM at the office of Dr. Bo Merino.   You may experience shorter wait times on Monday and Friday afternoons. The office is located at [address], [place], [place],  [address] No appointment is necessary.   Labs are drawn by [facility].  You may receive a bill from Merino for your lab work.  If you wish to have your labs drawn at another location, please call the office 24 hours in advance to send orders.  If you have any questions regarding directions or hours of operation,  please call [phone].   Just as a reminder please drink plenty of water prior to coming for your lab work. Thanks!  Tocilizumab injection What is this medicine? TOCILIZUMAB (TOE si LIZ ue mab) is used to treat rheumatoid arthritis and juvenile idiopathic arthritis. It is also used to treat giant cell arteritis and cytokine release syndrome. This medicine may be used for other purposes; ask your health care provider or pharmacist if you have questions. COMMON BRAND NAME(S): Actemra What should I tell my health care provider before I take this medicine? They need to know if you have any of these conditions: -cancer -diabetes -heart disease -hepatitis B or history of hepatitis B infection -high blood pressure -high cholesterol -immune system problems -infection (especially a virus infection such as chickenpox, cold sores, or herpes) -liver disease -low blood counts, like low white cell, platelet, or red cell counts -multiple sclerosis -recently received or scheduled to receive a vaccination -scheduled to have  surgery -stomach or intestine problems -stroke -tuberculosis, a positive skin test for tuberculosis, or have recently been in close contact with someone who has tuberculosis -an unusual or allergic reaction to tocilizumab, other medicines, foods, dyes, or preservatives -pregnant or trying to get pregnant -breast-feeding How should I use this medicine? This medicine is for infusion into a vein or for injection under the skin. It is usually given by a health care professional in a hospital or clinic setting. If you get this medicine at home, you will be taught how to prepare and give this medicine. Use exactly as directed. Take your medicine at regular intervals. Do not take your medicine more often than directed. It is important that you put your used needles and syringes in a special sharps container. Do not put them in a trash can. If you do not have a sharps container, call your pharmacist or healthcare provider to get one. A special MedGuide will be given to you by the pharmacist with each prescription and refill. Be sure to read this information carefully each time. Talk to your pediatrician regarding the use of this medicine in children. While the drug may be prescribed for children as young as 2 years for selected conditions, precautions do apply. Overdosage: If you think you have taken too much of this medicine contact a poison control center or emergency room at once. NOTE: This medicine is only for you. Do not share this medicine with others. What if I miss a dose? It is important not to miss your dose. Call your doctor or  health care professional if you are unable to keep an appointment. If you give yourself the medicine and you miss a dose, take it as soon as you can. If it is almost time for your next dose, take only that dose. Do not take double or extra doses. What may interact with this medicine? Do not take this medicine with any of the following medications: -live virus  vaccines This medicine may also interact with the following medications: -biologic medicines such as abatacept, adalimumab, anakinra, certolizumab, etanercept, golimumab, infliximab, rituximab, secukinumab, ustekinumab -birth control pills -certain medicines for cholesterol like atorvastatin, lovastatin, and simvastatin -cyclosporine -omeprazole -steroid medicines like prednisone or cortisone -theophylline -vaccines -warfarin This list may not describe all possible interactions. Give your health care provider a list of all the medicines, herbs, non-prescription drugs, or dietary supplements you use. Also tell them if you smoke, drink alcohol, or use illegal drugs. Some items may interact with your medicine. What should I watch for while using this medicine? Your condition will be monitored carefully while you are receiving this medicine. Tell your doctor or healthcare professional if your symptoms do not start to get better or if they get worse. You may need blood work done while you are taking this medicine. You will be tested for tuberculosis (TB) before you start this medicine. If your doctor prescribes any medicine for TB, you should start taking the TB medicine before starting this medicine. Make sure to finish the full course of TB medicine. Call your doctor or health care professional for advice if you get a fever, chills or sore throat, or other symptoms of a cold or flu. Do not treat yourself. This drug decreases your body's ability to fight infections. Try to avoid being around people who are sick. Talk to your doctor about your risk of cancer. You may be more at risk for certain types of cancers if you take this medicine. What side effects may I notice from receiving this medicine? Side effects that you should report to your doctor or health care professional as soon as possible: -allergic reactions like skin rash, itching or hives, swelling of the face, lips, or tongue -breathing  problems -changes in vision -feeling faint or lightheaded, falls -high blood pressure -signs and symptoms of infection like fever or chills; cough; sore throat; pain or trouble passing urine -signs and symptoms of liver injury like dark yellow or brown urine; general ill feeling or flu-like symptoms; light-colored stools; loss of appetite; nausea; right upper belly pain; unusually weak or tired; yellowing of the eyes or skin -stomach pain -tingling, numbness in the hands or feet -unusual bleeding or bruising -unusually weak or tired Side effects that usually do not require medical attention (report to your doctor or health care professional if they continue or are bothersome): -dizziness -headache -pain, redness, or irritation at site where injected This list may not describe all possible side effects. Call your doctor for medical advice about side effects. You may report side effects to FDA at 1-800-FDA-1088. Where should I keep my medicine? Keep out of the reach of children. If you are using this medicine at home, you will be instructed on how to store this medicine. Throw away any unused medicine after the expiration date on the label. NOTE: This sheet is a summary. It may not cover all possible information. If you have questions about this medicine, talk to your doctor, pharmacist, or health care provider.  2019 Elsevier/Gold Standard (2017-09-09 15:10:59)

## 2019-02-04 ENCOUNTER — Telehealth: Payer: Self-pay | Admitting: Diagnostic Neuroimaging

## 2019-02-04 NOTE — Telephone Encounter (Signed)
Pt is wanting to know if he is needing an MRI scheduled. Please advise.

## 2019-02-04 NOTE — Procedures (Signed)
[facility]  NCS (NERVE CONDUCTION STUDY) WITH EMG (ELECTROMYOGRAPHY) REPORT   STUDY DATE: 01/28/19 PATIENT NAME: Benjamin Fields DOB: 03-11-1961 MRN: [id number]  ORDERING CLINICIAN: Andrey Spearman, MD   TECHNOLOGIST: Sherre Scarlet ELECTROMYOGRAPHER: Earlean Polka. , MD  CLINICAL INFORMATION: 58 year old male with numbness.  FINDINGS: NERVE CONDUCTION STUDY:  Right median and right ulnar motor responses are normal.  Right peroneal and right tibial responses have decreased amplitudes, and borderline slow conduction velocity in right peroneal response and normal conduction velocity and right tibial motor response.  Right sural and superficial peroneal sensory sponsors have decreased amplitudes.  Right median and right ulnar sensory responses are normal.  Right tibial F wave latency slightly prolonged.  Right ulnar F-wave latency is normal.    NEEDLE ELECTROMYOGRAPHY: Needle examination of right vastus medialis, tibialis anterior, gastrocnemius and right lumbar paraspinal muscles normal.   IMPRESSION:  Abnormal study demonstrating: - Mild length dependent, axonal sensorimotor polyneuropathy.     INTERPRETING PHYSICIAN:  Penni Bombard, MD Certified in Neurology, Neurophysiology and Neuroimaging  [facility] [address] [place], [place] [address] [phone]   [facility]    Nerve / Sites Muscle Latency Ref. Amplitude Ref. Rel Amp Segments Distance Velocity Ref. Area    ms ms mV mV %  cm m/s m/s mVms  R Median - APB     Wrist APB 2.8 ?4.4 11.8 ?4.0 100 Wrist - APB 7   38.0     Upper arm APB 6.7  10.0  85.1 Upper arm - Wrist 22 56 ?49 32.1  R Ulnar - ADM     Wrist ADM 2.4 ?3.3 12.1 ?6.0 100 Wrist - ADM 7   27.3     B.Elbow ADM 5.6  10.4  86.2 B.Elbow - Wrist 19 60 ?49 25.7     A.Elbow ADM 7.3  10.1  96.7 A.Elbow - B.Elbow 10 60 ?49 25.1         A.Elbow - Wrist      R Peroneal - EDB     Ankle EDB 5.8 ?6.5 1.1 ?2.0  100 Ankle - EDB 9   2.6     Fib head EDB 12.7  0.9  82 Fib head - Ankle 30 44 ?44 4.2     Pop fossa EDB 14.9  0.9  97.6 Pop fossa - Fib head 10 44 ?44 3.9         Pop fossa - Ankle      R Tibial - AH     Ankle AH 4.2 ?5.8 7.0 ?4.0 100 Ankle - AH 9   20.5     Pop fossa AH 14.5  1.4  20.5 Pop fossa - Ankle 40 39 ?41 5.0             SNC    Nerve / Sites Rec. Site Peak Lat Ref.  Amp Ref. Segments Distance    ms ms V V  cm  R Sural - Ankle (Calf)     Calf Ankle 3.5 ?4.4 5 ?6 Calf - Ankle 14  R Superficial peroneal - Ankle     Lat leg Ankle 3.6 ?4.4 2 ?6 Lat leg - Ankle 14  R Median - Orthodromic (Dig II, Mid palm)     Dig II Wrist 3.0 ?3.4 17 ?10 Dig II - Wrist 13  R Ulnar - Orthodromic, (Dig V, Mid palm)     Dig V Wrist 2.7 ?3.1 8 ?5 Dig V - Wrist  89              F  Wave    Nerve F Lat Ref.   ms ms  R Tibial - AH 57.5 ?56.0  R Ulnar - ADM 26.5 ?32.0         EMG       EMG Summary Table    Spontaneous MUAP Recruitment  Muscle IA Fib PSW Fasc Other Amp Dur. Poly Pattern  R. Vastus medialis Normal None None None _______ Normal Normal Normal Normal  R. Tibialis anterior Normal None None None _______ Normal Normal Normal Normal  R. Gastrocnemius (Medial head) Normal None None None _______ Normal Normal Normal Normal  R. Lumbar paraspinals Normal None None None _______ Normal Normal Normal Normal

## 2019-02-04 NOTE — Telephone Encounter (Signed)
LVM informing patient  Dr Leta Baptist ordered MRI cervical spine and MRI thoracic spine in Jan. He may call [facility] and schedule as his insurance has approved these scans. I advised If he has other questions, please call back.

## 2019-02-15 ENCOUNTER — Encounter: Payer: Self-pay | Admitting: Rheumatology

## 2019-02-19 NOTE — Progress Notes (Deleted)
Office Visit Note  Patient: Benjamin Fields             Date of Birth: [birth year]           MRN: [id number]             PCP: Lennie Odor, PA-C Referring: Lennie Odor, PA-C Visit Date: 03/05/2019 Occupation: @GUAROCC @  Subjective:  No chief complaint on file.   History of Present Illness: Benjamin Fields is a 58 y.o. male ***   Activities of Daily Living:  Patient reports morning stiffness for *** {minute/hour:19697}.   Patient {ACTIONS;DENIES/REPORTS:21021675::"Denies"} nocturnal pain.  Difficulty dressing/grooming: {ACTIONS;DENIES/REPORTS:21021675::"Denies"} Difficulty climbing stairs: {ACTIONS;DENIES/REPORTS:21021675::"Denies"} Difficulty getting out of chair: {ACTIONS;DENIES/REPORTS:21021675::"Denies"} Difficulty using hands for taps, buttons, cutlery, and/or writing: {ACTIONS;DENIES/REPORTS:21021675::"Denies"}  No Rheumatology ROS completed.   PMFS History:  Patient Active Problem List   Diagnosis Date Noted  . Rheumatoid arthritis of multiple sites with negative rheumatoid factor ([place]) 05/19/2018  . Tinea versicolor 05/19/2018  . Essential hypertension 03/17/2018  . DDD (degenerative disc disease), cervical 03/16/2018    Past Medical History:  Diagnosis Date  . Hypertension   . RA (rheumatoid arthritis) (HCC)     Family History  Problem Relation Age of Onset  . Parkinson's disease Mother   . Prostate cancer Mother   . Atrial fibrillation Mother   . Heart failure Father   . Hypertension Father    Past Surgical History:  Procedure Laterality Date  . CERVICAL SPINE SURGERY  2010  . TONSILLECTOMY     Social History   Social History Narrative   Lives home with wife.  workd at [employer]  Education: Masters.  Children 2.  Caffeine 2 cups daily   Immunization History  Administered Date(s) Administered  . Influenza,inj,Quad PF,6+ Mos 07/06/2016     Objective: Vital Signs: There were no vitals taken for this visit.   Physical Exam   Musculoskeletal  Exam: ***  CDAI Exam: CDAI Score: - Patient Global: -; Provider Global: - Swollen: -; Tender: - Joint Exam   No joint exam has been documented for this visit   There is currently no information documented on the homunculus. Go to the Rheumatology activity and complete the homunculus joint exam.  Investigation: No additional findings.  Imaging: No results found.  Recent Labs: Lab Results  Component Value Date   WBC 5.6 11/20/2018   HGB 15.9 11/20/2018   PLT 194 11/20/2018   NA 141 11/20/2018   K 4.5 11/20/2018   CL 106 11/20/2018   CO2 27 11/20/2018   GLUCOSE 94 11/20/2018   BUN 24 11/20/2018   CREATININE 1.36 (H) 11/20/2018   BILITOT 0.8 11/20/2018   AST 19 11/20/2018   ALT 42 11/20/2018   PROT 6.2 11/20/2018   CALCIUM 9.5 11/20/2018   GFRAA 66 11/20/2018   QFTBGOLDPLUS NEGATIVE 03/17/2018    Speciality Comments: Prior therapy: Enbrel (inadequate reponse)  Procedures:  No procedures performed Allergies: Cortisone   Assessment / Plan:     Visit Diagnoses: No diagnosis found.   Orders: No orders of the defined types were placed in this encounter.  No orders of the defined types were placed in this encounter.   Face-to-face time spent with patient was *** minutes. Greater than 50% of time was spent in counseling and coordination of care.  Follow-Up Instructions: No follow-ups on file.   Earnestine Mealing, CMA  Note - This record has been created using Editor, commissioning.  Chart creation errors have been sought, but may  not always  have been located. Such creation errors do not reflect on  the standard of medical care.

## 2019-02-23 ENCOUNTER — Encounter: Payer: Self-pay | Admitting: Rheumatology

## 2019-03-01 ENCOUNTER — Other Ambulatory Visit: Payer: BC Managed Care – PPO

## 2019-03-05 ENCOUNTER — Ambulatory Visit: Payer: BC Managed Care – PPO | Admitting: Rheumatology

## 2019-03-10 NOTE — Telephone Encounter (Signed)
[employer] Auth: [id number] (exp. 03/10/19 to 09/05/19) patient is scheduled at GI for 03/11/19.

## 2019-03-11 ENCOUNTER — Other Ambulatory Visit: Payer: Self-pay

## 2019-03-11 ENCOUNTER — Ambulatory Visit
Admission: RE | Admit: 2019-03-11 | Discharge: 2019-03-11 | Disposition: A | Discharge status: Home / Self Care | Payer: BC Managed Care – PPO | Source: Ambulatory Visit | Attending: Diagnostic Neuroimaging | Admitting: Diagnostic Neuroimaging

## 2019-03-11 DIAGNOSIS — R292 Abnormal reflex: Secondary | ICD-10-CM

## 2019-03-11 DIAGNOSIS — R200 Anesthesia of skin: Secondary | ICD-10-CM | POA: Diagnosis not present

## 2019-03-11 DIAGNOSIS — R202 Paresthesia of skin: Secondary | ICD-10-CM | POA: Diagnosis not present

## 2019-03-11 MED ORDER — GADOBENATE DIMEGLUMINE 529 MG/ML IV SOLN
17.0000 mL | Freq: Once | INTRAVENOUS | Status: AC | PRN
  Administered 2019-03-11: 17 mL via INTRAVENOUS

## 2019-03-11 NOTE — Progress Notes (Signed)
Office Visit Note  Patient: Benjamin Fields             Date of Birth: [birth year]           MRN: [id number]             PCP: Lennie Odor, PA-C Referring: Lennie Odor, PA-C Visit Date: 03/25/2019 Occupation: @GUAROCC @  Subjective:  Right shoulder pain.   History of Present Illness: Benjamin Fields is a 58 y.o. male with history of seronegative arthritis.  He is taking Actemra 162mg  every 14 days, Otrexup 20 mg every 7 days, and folic acid 2 mg daily.  He denies missing any doses.  He denies any recent infections.  He has noticed minimal improvement since starting Actemra.  He is still having joint pain and swelling in his right shoulder.  He also has diminished range of motion as well.  He states that it bothers him 70% of the time.  He also reports joint pain and swelling bilateral hands.  He still has the rash on his feet but he has noticed improvement since starting a cream prescribed by his dermatologist. His wife states that he experiences fatigue, nausea, and loss of appetite with methotrexate. She also reports he has lost some weight.  He states he continues to have some neuropathy in his right lower extremity and lower back pain.  Has been seeing Dr. Leta Baptist.  Activities of Daily Living:  Patient reports morning stiffness for 1 hour.   Patient Reports nocturnal pain.  Difficulty dressing/grooming: Denies Difficulty climbing stairs: Denies Difficulty getting out of chair: Denies Difficulty using hands for taps, buttons, cutlery, and/or writing: Denies  Review of Systems  Constitutional: Positive for fatigue. Negative for night sweats.  HENT: Positive for mouth dryness. Negative for mouth sores and nose dryness.   Eyes: Positive for redness and itching. Negative for dryness.  Respiratory: Negative for shortness of breath, wheezing and difficulty breathing.   Cardiovascular: Negative for chest pain, palpitations, hypertension, irregular heartbeat and swelling in  legs/feet.  Gastrointestinal: Negative for blood in stool, constipation and diarrhea.  Endocrine: Negative for increased urination.  Genitourinary: Negative for painful urination.  Musculoskeletal: Positive for arthralgias, joint pain, joint swelling and morning stiffness. Negative for myalgias, muscle weakness, muscle tenderness and myalgias.  Skin: Negative for color change, rash, hair loss, nodules/bumps, redness, skin tightness, ulcers and sensitivity to sunlight.  Allergic/Immunologic: Negative for susceptible to infections.  Neurological: Positive for weakness. Negative for dizziness, fainting, headaches, memory loss and night sweats.  Hematological: Negative for bruising/bleeding tendency and swollen glands.  Psychiatric/Behavioral: Negative for depressed mood, confusion and sleep disturbance. The patient is not nervous/anxious.     PMFS History:  Patient Active Problem List   Diagnosis Date Noted   Rheumatoid arthritis of multiple sites with negative rheumatoid factor ([place]) 05/19/2018   Tinea versicolor 05/19/2018   Essential hypertension 03/17/2018   DDD (degenerative disc disease), cervical 03/16/2018    Past Medical History:  Diagnosis Date   Hypertension    RA (rheumatoid arthritis) ([place])     Family History  Problem Relation Age of Onset   Parkinson's disease Mother    Prostate cancer Mother    Atrial fibrillation Mother    Heart failure Father    Hypertension Father    Past Surgical History:  Procedure Laterality Date   CERVICAL SPINE SURGERY  2010   TONSILLECTOMY     Social History   Social History Narrative   Lives home with wife.  workd  at [employer]  Education: Masters.  Children 2.  Caffeine 2 cups daily   Immunization History  Administered Date(s) Administered   Influenza,inj,Quad PF,6+ Mos 07/06/2016     Objective: Vital Signs: BP 134/86 (BP Location: Left Arm, Patient Position: Sitting, Cuff Size: Normal)    Pulse 65    Resp 12    Ht  5\' 10"  (1.778 m)    Wt 186 lb 6.4 oz (84.6 kg)    BMI 26.75 kg/m    Physical Exam Vitals signs and nursing note reviewed.  Constitutional:      Appearance: He is well-developed.  HENT:     Head: Normocephalic and atraumatic.  Eyes:     Conjunctiva/sclera: Conjunctivae normal.     Pupils: Pupils are equal, round, and reactive to light.  Neck:     Musculoskeletal: Normal range of motion and neck supple.  Cardiovascular:     Rate and Rhythm: Normal rate and regular rhythm.     Heart sounds: Normal heart sounds.  Pulmonary:     Effort: Pulmonary effort is normal.     Breath sounds: Normal breath sounds.  Abdominal:     General: Bowel sounds are normal.     Palpations: Abdomen is soft.  Skin:    General: Skin is warm and dry.     Capillary Refill: Capillary refill takes less than 2 seconds.  Neurological:     Mental Status: He is alert and oriented to person, place, and time.  Psychiatric:        Behavior: Behavior normal.      Musculoskeletal Exam: He has limited range of motion of his cervical, thoracic and lumbar spine with discomfort.  Right shoulder joint abduction was about 140 degrees with some discomfort.  Left shoulder joint range of motion was complete.  He had mild tenderness and swelling on palpation of his right shoulder joint without any effusion.  Elbow joints, wrist joints, MCPs, PIPs DIPs with good range of motion with no synovitis.  Hip joints, knee joints, ankles MTPs PIPs with good range of motion with no synovitis.  CDAI Exam: CDAI Score: 3  Patient Global: 6 mm; Provider Global: 4 mm Swollen: 1 ; Tender: 1  Joint Exam      Right  Left  Glenohumeral  Swollen Tender        Investigation: No additional findings.  Imaging: Mr Cervical Spine W Wo Contrast  Result Date: 03/12/2019  [facility] [address], [place], [place] [address] [phone] NEUROIMAGING REPORT STUDY DATE: 03/11/2019 PATIENT NAME: Benjamin Fields DOB:  [birth year] MRN: [id number] ORDERING CLINICIAN: Dr Leta Baptist CLINICAL HISTORY: 58 year male being evaluated for numbness COMPARISON FILMS: MRI C Spine 12/19/2005 EXAM: MRI Cervical Spine w/wo TECHNIQUE: MRI of the cervical spine was obtained utilizing 3 mm sagittal slices from the posterior fossa down to the T3-4 level with T1, T2 and inversion recovery views. In addition 4 mm axial slices from O1-3 down to T1-2 level were included with T2 and gradient echo views.  Postcontrast sagittal and axial T1 images were obtained as well. CONTRAST: 17 mL IV MultiHance IMAGING SITE: [facility] [facility] FINDINGS: The cervical vertebrae demonstrate normal alignment but with postoperative changes of anterior cervical fusion with interbody grafts from C4-C6 with stable metallic hardware.  C2-3 shows no significant abnormalities.  C3-4 shows diffuse disc bulge along with ligamentum flavum hypertrophy resulting in mild canal and bilateral foraminal narrowing.  C4-C6 shows stable metallic hardware but patent canal without significant compression.  C6-7 also shows mild disc degenerative change with ligamentum flavum hypertrophy but no significant compression.  Visualized portion of the upper thoracic spine show no abnormalities.  The craniovertebral junction and lower brainstem appear unremarkable.  Paraspinal soft tissues show no significant abnormalities.  Postcontrast images do not result in any abnormal areas of enhancement.   Abnormal MRI scan cervical spine with and without contrast showing stable changes of anterior cervical fusion from C4-C6 with mild disc degenerative changes at C3-4 and C 6-7 with mild canal and bilateral foraminal narrowing.  Compared to previous MRI dated 12/19/2005 the cervical fusion changes appear new. INTERPRETING PHYSICIAN: Antony Contras, MD Certified in  Neuroimaging by [place] [employer] and [employer]  Mr Thoracic Spine W Wo Contrast  Result Date:  03/12/2019  [facility] [address], [place], [place] [address] [phone] NEUROIMAGING REPORT STUDY DATE: 03/11/2019 PATIENT NAME: Benjamin Fields DOB: 03/16/1961 MRN: [id number] ORDERING CLINICIAN: Dr Leta Baptist CLINICAL HISTORY: 58 year patient being evaluated for numbness COMPARISON FILMS: none EXAM: MRI Thoraxic Spine w/wo TECHNIQUE: MRI of the thoracic spine was obtained utilizing 3 mm sagittal slices from H0-3 down to the L1-2 level with T1, T2 and inversion recovery views. In addition 4 mm axial slices from U8-E2 down to T12-L1 level were included with T1, T2 and gradient echo views.  Postcontrast axial and sagittal T1 images were obtained as well. CONTRAST: 17 mL IV MultiHance IMAGING SITE: [facility] [facility] FINDINGS: The thoracic vertebrae demonstrate normal alignment vertebral body height and marrow signal characteristics.  There are postoperative changes of anterior cervical fusion noted in the lower cervical vertebrae.  The intervertebral discs are normally appearance except at T8-9, T9-10 and T10-11 but there is loss of disc height.  At T8-9 there is diffuse disc bulge as well as hypertrophy of ligamentum flavum and facet joint bilaterally resulting in mild posterior canal narrowing but no definite compression.  T9-10 also shows similar though lesser degree of changes.  T10-11 also shows similar findings.  The paraspinal soft tissue appear unremarkable.  Visualized portion of the upper lumbar vertebrae show no significant abnormalities.  Postcontrast images do not result in abnormal areas of enhancement.   Abnormal MRI scan of thoracic spine with and without contrast showing mild disc and facet degenerative changes at T8-9 to T10-11 with mild posterior canal narrowing but no significant compression. INTERPRETING PHYSICIAN: Antony Contras, MD Certified in  Neuroimaging by [place] [employer] and [employer]   Recent  Labs: Lab Results  Component Value Date   WBC 5.6 11/20/2018   HGB 15.9 11/20/2018   PLT 194 11/20/2018   NA 141 11/20/2018   K 4.5 11/20/2018   CL 106 11/20/2018   CO2 27 11/20/2018   GLUCOSE 94 11/20/2018   BUN 24 11/20/2018   CREATININE 1.36 (H) 11/20/2018   BILITOT 0.8 11/20/2018   AST 19 11/20/2018   ALT 42 11/20/2018   PROT 6.2 11/20/2018   CALCIUM 9.5 11/20/2018   GFRAA 66 11/20/2018   QFTBGOLDPLUS NEGATIVE 03/17/2018    Speciality Comments: Prior therapy: Enbrel (inadequate reponse)  Procedures:  Large Joint Inj: R glenohumeral on 03/25/2019 11:53 AM Indications: pain Details: 27 G 1.5 in needle, posterior approach  Arthrogram: No  Medications: 1.5 mL lidocaine 1 %; 40 mg triamcinolone acetonide 40 MG/ML Aspirate: 0 mL Outcome: tolerated well, no immediate complications Procedure, treatment alternatives, risks and benefits explained, specific risks discussed. Consent was given by the patient.  Immediately prior to procedure a time out was called to verify the correct patient, procedure, equipment, support staff and site/side marked as required. Patient was prepped and draped in the usual sterile fashion.     Allergies: Cortisone   Assessment / Plan:     Visit Diagnoses: Rheumatoid arthritis of multiple sites with negative rheumatoid factor ([place]) -patient is not very satisfied with the response to Actemra and methotrexate combination.  Although on my examination he had remarkable improvement compared to last visit.  He has some mild swelling in his right shoulder otherwise the effusion in his knee is resolved and he had none of the other joints which were inflamed.  I would like for him to continue on Actemra and methotrexate combination.  He has been also experiencing some nausea with methotrexate.  I will call in prescription for Zofran 4 mg p.o. every 6 hours as needed as needed.  High risk medication use - Actemra sq every 2 weeks and methotrexate 20 mg subcu  weekly.  Inadequate response to Enbrel and Humira - Plan: CBC with Differential/Platelet, COMPLETE METABOLIC PANEL WITH GFR, QuantiFERON-TB Gold Plus, today.  Medication monitoring encounter - Plan: Lipid panel,   Effusion of right shoulder -the effusion has resolved although he has some swelling and discomfort.  I discussed cortisone injection with him which she was in agreement.  After informed consent was obtained right shoulder joint was injected with cortisone and he tolerated the procedure well.  I will see response to that.  DDD (degenerative disc disease), cervical - Plan: He has chronic discomfort and has been seen by Dr. Leta Baptist.  Numbness and tingling of both feet -he is planning to start on Cymbalta and Lyrica per Dr. Gladstone Lighter recommendations.  Tinea versicolor -he still have lesions of tinea versicolor.  Using Selsun Blue shampoo on a regular basis was emphasized.  Other fatigue -relates to insomnia.  Hopefully his insomnia will improve after starting on Cymbalta and Lyrica.  Essential hypertension -his blood pressure is controlled.  Have advised him to monitor blood pressure closely after cortisone injection.  Orders: Orders Placed This Encounter  Procedures   Large Joint Inj   CBC with Differential/Platelet   COMPLETE METABOLIC PANEL WITH GFR   QuantiFERON-TB Gold Plus   Lipid panel   Meds ordered this encounter  Medications   ondansetron (ZOFRAN) 4 MG tablet    Sig: Take 1 tablet (4 mg total) by mouth every 6 (six) hours as needed for nausea or vomiting.    Dispense:  30 tablet    Refill:  2      Follow-Up Instructions: Return in about 3 months (around 06/25/2019) for Rheumatoid arthritis.   Bo Merino, MD  Note - This record has been created using Editor, commissioning.  Chart creation errors have been sought, but may not always  have been located. Such creation errors do not reflect on  the standard of medical care.

## 2019-03-13 ENCOUNTER — Other Ambulatory Visit: Payer: Self-pay | Admitting: Rheumatology

## 2019-03-14 ENCOUNTER — Encounter: Payer: Self-pay | Admitting: Rheumatology

## 2019-03-15 ENCOUNTER — Telehealth: Payer: Self-pay | Admitting: Neurology

## 2019-03-15 NOTE — Telephone Encounter (Signed)
I called the patient.  MRI of the cervical and thoracic spine shows prior evidence of cervical spine surgery but no evidence of compromise of the spinal canal or evidence of spinal cord compression or injury that would explain hyperreflexia.    MRI thoracic 03/12/19:  IMPRESSION: Abnormal MRI scan of thoracic spine with and without contrast showing mild disc and facet degenerative changes at T8-9 to T10-11 with mild posterior canal narrowing but no significant compression.   MRI cervical 03/12/19:  IMPRESSION: Abnormal MRI scan cervical spine with and without contrast showing stable changes of anterior cervical fusion from C4-C6 with mild disc degenerative changes at C3-4 and C 6-7 with mild canal and bilateral foraminal narrowing.  Compared to previous MRI dated 12/19/2005 the cervical fusion changes appear new.

## 2019-03-15 NOTE — Telephone Encounter (Signed)
Last Visit: 01/22/2019 telemedicine  Next Visit: 03/25/2019  Okay to refill per Dr.Deveshwar.

## 2019-03-19 ENCOUNTER — Encounter: Payer: Self-pay | Admitting: Rheumatology

## 2019-03-23 ENCOUNTER — Encounter: Payer: Self-pay | Admitting: Rheumatology

## 2019-03-25 ENCOUNTER — Other Ambulatory Visit: Payer: Self-pay

## 2019-03-25 ENCOUNTER — Ambulatory Visit: Payer: BC Managed Care – PPO | Admitting: Rheumatology

## 2019-03-25 ENCOUNTER — Encounter: Payer: Self-pay | Admitting: Physician Assistant

## 2019-03-25 VITALS — BP 134/86 | HR 65 | Resp 12 | Ht 70.0 in | Wt 186.4 lb

## 2019-03-25 DIAGNOSIS — M0609 Rheumatoid arthritis without rheumatoid factor, multiple sites: Secondary | ICD-10-CM

## 2019-03-25 DIAGNOSIS — Z79899 Other long term (current) drug therapy: Secondary | ICD-10-CM | POA: Diagnosis not present

## 2019-03-25 DIAGNOSIS — B360 Pityriasis versicolor: Secondary | ICD-10-CM

## 2019-03-25 DIAGNOSIS — M25411 Effusion, right shoulder: Secondary | ICD-10-CM | POA: Diagnosis not present

## 2019-03-25 DIAGNOSIS — I10 Essential (primary) hypertension: Secondary | ICD-10-CM

## 2019-03-25 DIAGNOSIS — R200 Anesthesia of skin: Secondary | ICD-10-CM

## 2019-03-25 DIAGNOSIS — R5383 Other fatigue: Secondary | ICD-10-CM

## 2019-03-25 DIAGNOSIS — Z5181 Encounter for therapeutic drug level monitoring: Secondary | ICD-10-CM

## 2019-03-25 DIAGNOSIS — R202 Paresthesia of skin: Secondary | ICD-10-CM

## 2019-03-25 DIAGNOSIS — M5030 Other cervical disc degeneration, unspecified cervical region: Secondary | ICD-10-CM

## 2019-03-25 MED ORDER — ONDANSETRON HCL 4 MG PO TABS: 4.0000 mg | ORAL_TABLET | Freq: Four times a day (QID) | ORAL | 2 refills | Status: DC | PRN

## 2019-03-25 MED ORDER — LIDOCAINE HCL 1 % IJ SOLN
1.5000 mL | INTRAMUSCULAR | Status: AC | PRN
  Administered 2019-03-25: 1.5 mL

## 2019-03-25 MED ORDER — TRIAMCINOLONE ACETONIDE 40 MG/ML IJ SUSP
40.0000 mg | INTRAMUSCULAR | Status: AC | PRN
  Administered 2019-03-25: 40 mg via INTRA_ARTICULAR

## 2019-03-25 NOTE — Progress Notes (Signed)
Pharmacy Note  Subjective: Patient presents today to the [facility] to see Dr. Estanislado Pandy for rheumatoid arthritis follow up.   Patient seen by the pharmacist for medication review. Current medication regimen includes Actemra, Methotrexate, and folic acid.  Prior therapy includes Enbrel and Humira with inadequate response. He has noticed minimal improvement since starting Actemra.  He is still having discomfort and swelling in his right shoulder as well as bilateral hands.    Medication Review:  Does the patient feel that his/her medications are effective in managing symptoms? No Has the patient been experiencing any side effects to the medications prescribed?   No Has the patient missed any doses in the past few weeks? No Does the patient have any problems obtaining medications from the pharmacy?   No Does the patient have difficulty affording their medications?  No  Objective: Current Outpatient Medications on File Prior to Visit  Medication Sig Dispense Refill  . folic acid (FOLVITE) 1 MG tablet Take 2 tablets by mouth once daily 180 tablet 0  . losartan (COZAAR) 25 MG tablet Take 25 mg by mouth daily. for high blood pressure  1  . Methotrexate, PF, 20 MG/0.4ML SOAJ Inject 20 mg into the skin once a week. 12 pen 0  . tiZANidine (ZANAFLEX) 4 MG tablet Take 1 tablet (4 mg total) by mouth at bedtime. (Patient taking differently: Take 4 mg by mouth at bedtime as needed. ) 30 tablet 0  . Tocilizumab (ACTEMRA ACTPEN) 162 MG/0.9ML SOAJ Inject 162 mg into the skin every 14 (fourteen) days. 2 pen 2  . traMADol (ULTRAM) 50 MG tablet TAKE 2 TABLETS BY MOUTH EVERY 6 HOURS AS NEEDED FOR  UP  TO  7  DAYS 40 tablet 0  . predniSONE (DELTASONE) 5 MG tablet Take 4 tablets by mouth daily x1 wk, 3 tablets by mouth daily x1wk, 2 tablets by mouth daily x1wk, 1 tablet by mouth daily x1wk. (Patient not taking: Reported on 09/25/2018) 70 tablet 0   No current facility-administered medications on file  prior to visit.      CBC    Component Value Date/Time   WBC 5.6 11/20/2018 0915   RBC 4.86 11/20/2018 0915   HGB 15.9 11/20/2018 0915   HCT 45.5 11/20/2018 0915   PLT 194 11/20/2018 0915   MCV 93.6 11/20/2018 0915   MCH 32.7 11/20/2018 0915   MCHC 34.9 11/20/2018 0915   RDW 14.0 11/20/2018 0915   LYMPHSABS 2,005 11/20/2018 0915   EOSABS 358 11/20/2018 0915   BASOSABS 67 11/20/2018 0915     CMP     Component Value Date/Time   NA 141 11/20/2018 0915   K 4.5 11/20/2018 0915   CL 106 11/20/2018 0915   CO2 27 11/20/2018 0915   GLUCOSE 94 11/20/2018 0915   BUN 24 11/20/2018 0915   CREATININE 1.36 (H) 11/20/2018 0915   CALCIUM 9.5 11/20/2018 0915   PROT 6.2 11/20/2018 0915   AST 19 11/20/2018 0915   ALT 42 11/20/2018 0915   BILITOT 0.8 11/20/2018 0915   GFRNONAA 57 (L) 11/20/2018 0915   GFRAA 66 11/20/2018 0915     TB GOLD Quantiferon TB Gold Latest Ref Rng & Units 03/17/2018  Quantiferon TB Gold Plus NEGATIVE NEGATIVE     Assessment/Plan:  He is taking Actemra 162 mg every 14 days (started 02/02/2019), Otrexup 20 mg every 7 days, and folic acid 1 mg 2 tablets daily.  Last TB gold negative on 03/17/2018.  Due for TB gold  today and will monitor yearly.  Last lipid panel showed elevated LDL at 103 on 01/27/2019.  Will monitor yearly.  Most recent CBC/CMP within normal limits on 01/27/2019.  Due for CBC/CMP to monitor for drug toxicity and will monitor every 3 months.  Standing orders are in place. He states that he is up to date on his flu, pneumonia, and shingles vaccines.  All questions encouraged and answered.  Instructed patient to call with any further questions or concerns.  Mariella Saa, PharmD, [place], [place] Clinical Specialty Pharmacist [phone]  03/25/2019 11:04 AM

## 2019-03-26 NOTE — Progress Notes (Signed)
Labs are stable.  LDL is elevated.  Please forward labs to his PCP.

## 2019-03-28 LAB — CBC WITH DIFFERENTIAL/PLATELET
Absolute Monocytes: 451 cells/uL (ref 200–950)
Basophils Absolute: 110 cells/uL (ref 0–200)
Basophils Relative: 2.4 %
Eosinophils Absolute: 212 cells/uL (ref 15–500)
Eosinophils Relative: 4.6 %
HCT: 45.9 % (ref 38.5–50.0)
Hemoglobin: 15.9 g/dL (ref 13.2–17.1)
Lymphs Abs: 1868 cells/uL (ref 850–3900)
MCH: 33.8 pg — ABNORMAL HIGH (ref 27.0–33.0)
MCHC: 34.6 g/dL (ref 32.0–36.0)
MCV: 97.5 fL (ref 80.0–100.0)
MPV: 10.3 fL (ref 7.5–12.5)
Monocytes Relative: 9.8 %
Neutro Abs: 1960 cells/uL (ref 1500–7800)
Neutrophils Relative %: 42.6 %
Platelets: 177 10*3/uL (ref 140–400)
RBC: 4.71 10*6/uL (ref 4.20–5.80)
RDW: 14.1 % (ref 11.0–15.0)
Total Lymphocyte: 40.6 %
WBC: 4.6 10*3/uL (ref 3.8–10.8)

## 2019-03-28 LAB — COMPLETE METABOLIC PANEL WITH GFR
AG Ratio: 3 (calc) — ABNORMAL HIGH (ref 1.0–2.5)
ALT: 41 U/L (ref 9–46)
AST: 19 U/L (ref 10–35)
Albumin: 4.5 g/dL (ref 3.6–5.1)
Alkaline phosphatase (APISO): 40 U/L (ref 35–144)
BUN: 15 mg/dL (ref 7–25)
CO2: 28 mmol/L (ref 20–32)
Calcium: 10.1 mg/dL (ref 8.6–10.3)
Chloride: 104 mmol/L (ref 98–110)
Creat: 1.2 mg/dL (ref 0.70–1.25)
GFR, Est African American: 76 mL/min/{1.73_m2} (ref >=60)
GFR, Est Non African American: 65 mL/min/{1.73_m2} (ref >=60)
Globulin: 1.5 g/dL (calc) — ABNORMAL LOW (ref 1.9–3.7)
Glucose, Bld: 106 mg/dL — ABNORMAL HIGH (ref 65–99)
Potassium: 4.3 mmol/L (ref 3.5–5.3)
Sodium: 140 mmol/L (ref 135–146)
Total Bilirubin: 1.3 mg/dL — ABNORMAL HIGH (ref 0.2–1.2)
Total Protein: 6 g/dL — ABNORMAL LOW (ref 6.1–8.1)

## 2019-03-28 LAB — LIPID PANEL
Cholesterol: 197 mg/dL (ref <200)
HDL: 39 mg/dL — ABNORMAL LOW (ref >=40)
LDL Cholesterol (Calc): 121 mg/dL (calc) — ABNORMAL HIGH
Non-HDL Cholesterol (Calc): 158 mg/dL (calc) — ABNORMAL HIGH (ref <130)
Total CHOL/HDL Ratio: 5.1 (calc) — ABNORMAL HIGH (ref <5.0)
Triglycerides: 241 mg/dL — ABNORMAL HIGH (ref <150)

## 2019-03-28 LAB — QUANTIFERON-TB GOLD PLUS
Mitogen-NIL: >10 IU/mL
NIL: 0.02 IU/mL
QuantiFERON-TB Gold Plus: NEGATIVE
TB1-NIL: 0 IU/mL
TB2-NIL: 0 IU/mL

## 2019-04-06 ENCOUNTER — Encounter: Payer: Self-pay | Admitting: Rheumatology

## 2019-04-07 NOTE — Telephone Encounter (Signed)
Patient returned my call.  He states he has noticed changes in his mood and appetite and if it could be attributed to Actemra or Otrexup.  Informed that Actemra and Otrexup are not associated with changes in mood.  Otrexup could cause stomach upset leading to decrease in appetite a day or so after administration but should not cause long term changes.  He has brought up these symptoms in the past prior to starting Actermra.  He admits it could be due to life changes such as working from home and social distancing. Encouraged patient to follow up about changes in mood and appetite.  Patient verbalized understanding.  All questions encouraged and answered.  Instructed to call with any other questions or concerns.   Mariella Saa, PharmD, [place], [place] [facility] Clinical Specialty Pharmacist [phone]  04/07/2019 10:54 AM

## 2019-04-07 NOTE — Telephone Encounter (Signed)
Left voicemail to discuss symptoms more in depth.  Asked for return call at my desk.

## 2019-04-14 ENCOUNTER — Encounter: Payer: Self-pay | Admitting: Rheumatology

## 2019-04-18 ENCOUNTER — Encounter: Payer: Self-pay | Admitting: Rheumatology

## 2019-04-20 ENCOUNTER — Other Ambulatory Visit: Payer: Self-pay | Admitting: Rheumatology

## 2019-04-20 ENCOUNTER — Encounter: Payer: Self-pay | Admitting: Rheumatology

## 2019-04-20 MED ORDER — DULOXETINE HCL 30 MG PO CPEP: 30.0000 mg | ORAL_CAPSULE | Freq: Every day | ORAL | 0 refills | Status: DC

## 2019-04-20 NOTE — Telephone Encounter (Signed)
These advised patient not to stop the methotrexate.  That she has aggressive rheumatoid arthritis.  He should make an appointment with a psychiatrist for evaluation.  Methotrexate does not induce depression.  If he wants to schedule another visit with [place] please do so.

## 2019-04-20 NOTE — Telephone Encounter (Signed)
Left voicemail to discuss with patient.

## 2019-04-20 NOTE — Addendum Note (Signed)
Addended by: Carole Binning on: 04/20/2019 04:48 PM   Modules accepted: Orders

## 2019-04-20 NOTE — Telephone Encounter (Signed)
See previous message. Response sent patient.

## 2019-04-20 NOTE — Telephone Encounter (Signed)
I had detailed 15-minute discussion with patient and his wife.  Patient has been on methotrexate for several months.  This is not a new medication for him.  He is not uncertain if methotrexate is causing the depression.  He states that his mood has not been good and he feels depressed.  I explained to him that it is most likely due to the chronic disease.  Although so far he has done the best on methotrexate and Actemra combination.  He was in agreement with that.  I also asked him if he is taking Cymbalta.  He stated that Dr. Leta Baptist suggested Cymbalta and Lyrica for the peripheral neuritis but never prescribed it.  He states the tingling in his extremities has improved.  I offered Cymbalta for pain management and his depression.  Indications side effects contraindications of Cymbalta were discussed at length.  Patient stated that he is not taking tramadol anymore.  I will call in Cymbalta 30 mg p.o. nightly for 30 days and if he tolerates the medication well and he feels that we need to increase the dose then we can increase it to 60 mg p.o. nightly.  We will see response to Cymbalta over time.  I asked patient and his wife if they had any further questions or they were satisfied with the discussion.  They were thankful for the detailed discussion and they had no further questions.  I also suggested that if his dermatologist believes that he has psoriasis he should send [place] the office note with the diagnosis.  Patient never developed symptoms of dactylitis are sacroiliitis or other features of psoriatic arthritis.

## 2019-04-20 NOTE — Telephone Encounter (Signed)
Patient returned called.  Spoke with patient and wife.  They are very frustrated with his course of treatment and care. They have found several articles stating that methotrexate can cause depression.  Patient and caregiver feel as though our office does not address their concerns and they are being transferred to too many different offices.  Acknowledged their frustration and apologized if they felt unheard or felt we were deferring his care to other providers.  Explained that patient does have very aggressive disease and we are concerned about changing treatment options at this time since he has had the best control with current regimen.  Also explained that mood changes are difficult to attribute to the medication alone as being diagnosed with a new condition that is complex, requires frequent monitoring, and med changes can contribute as well.  We understand and hear their concerns but are trying to weigh the risk versus benefits.  Wife explained that patient has not been as forth coming about changes in mood, appetite, etc and how much they are affecting his quality of life.  They requested a second opinion.  Informed that would discuss with Dr. Estanislado Pandy and we do refer for second opinion on occasion for difficult to treat patients.    They are concerned about an incorrect diagnosis of rheumatoid arthrits and dermatology suggested psoriatic arthritis. Inquired about dermatology visits and if a biopsy had been performed.  Patient had stated biopsy had been done in the past and did not mention psoriasis but wife states he has never had a biopsy done.  Advised that we would need biopsy showing psoriais to confirm diagnosis.  Advise that I will discuss with Dr. Estanislado Pandy and someone from our office would be in contact with next steps.   Mariella Saa, PharmD, [place], [place] Clinical Specialty Pharmacist [phone]  04/20/2019 3:38 PM

## 2019-04-21 NOTE — Telephone Encounter (Signed)
Last Visit: 03/25/19 Next Visit: 06/25/19 Labs: 03/25/19 stable  Okay to refill per Dr. Estanislado Pandy

## 2019-05-04 ENCOUNTER — Encounter: Payer: Self-pay | Admitting: Rheumatology

## 2019-05-07 ENCOUNTER — Other Ambulatory Visit: Payer: Self-pay | Admitting: Rheumatology

## 2019-05-07 DIAGNOSIS — M0609 Rheumatoid arthritis without rheumatoid factor, multiple sites: Secondary | ICD-10-CM

## 2019-05-10 NOTE — Telephone Encounter (Signed)
Last Visit: 03/25/19 Next Visit: 06/25/19 Labs: 03/25/19 stable Tb Gold: 03/25/19 Neg   Okay to refill per Dr. Estanislado Pandy

## 2019-05-12 DIAGNOSIS — B353 Tinea pedis: Secondary | ICD-10-CM | POA: Diagnosis not present

## 2019-05-12 DIAGNOSIS — L814 Other melanin hyperpigmentation: Secondary | ICD-10-CM | POA: Diagnosis not present

## 2019-05-12 DIAGNOSIS — L821 Other seborrheic keratosis: Secondary | ICD-10-CM | POA: Diagnosis not present

## 2019-05-13 ENCOUNTER — Encounter: Payer: Self-pay | Admitting: Rheumatology

## 2019-05-13 ENCOUNTER — Telehealth: Payer: Self-pay | Admitting: Rheumatology

## 2019-05-13 ENCOUNTER — Other Ambulatory Visit: Payer: Self-pay

## 2019-05-13 MED ORDER — PREDNISONE 5 MG PO TABS: ORAL_TABLET | ORAL | 0 refills | Status: DC

## 2019-05-13 MED ORDER — DULOXETINE HCL 60 MG PO CPEP: 60.0000 mg | ORAL_CAPSULE | Freq: Every day | ORAL | 2 refills | Status: DC

## 2019-05-13 NOTE — Telephone Encounter (Signed)
I received a phone call from the patient has he has been running low-grade temperature and also having increased joint pain.  I returned his call and had detailed conversation with the patient and his wife.  He states his temperature has been in the low 99's.  He states he did really well after the right shoulder joint injection and now the shoulder joint pain is coming back.  He was also seen by dermatologist and was given oral antifungal agent at a low dose.  He was also given some topical agents.  His wife mentioned that his depression has improved on Cymbalta but he has not noticed remarkable improvement in the neuropathy and generalized pain.  We discussed increasing Cymbalta to 60 mg p.o. daily.  Side effects were discussed.  They were in agreement.  Have advised him to start taking two 30 mg tablets a day and if he tolerates that he can pick up the new prescription for Cymbalta 60 mg p.o. daily.  I would also call another prednisone taper for him.  He is on the third month of Actemra.  If he fails Actemra we discussed probably switching him to Rinvoq.  He has an appointment coming up at the end of October.  If his symptoms do not improve or he has ongoing discomfort he should contact us.  They were in agreement. I will send a prescription for Cymbalta and prednisone taper as discussed.  They are aware of the side effects of the prednisone.  Bo Merino, MD

## 2019-05-19 ENCOUNTER — Encounter: Payer: Self-pay | Admitting: Rheumatology

## 2019-05-24 ENCOUNTER — Encounter: Payer: Self-pay | Admitting: Rheumatology

## 2019-05-26 ENCOUNTER — Other Ambulatory Visit: Payer: Self-pay

## 2019-05-26 DIAGNOSIS — Z20822 Contact with and (suspected) exposure to covid-19: Secondary | ICD-10-CM

## 2019-05-27 LAB — NOVEL CORONAVIRUS, NAA: SARS-CoV-2, NAA: NOT DETECTED

## 2019-06-02 ENCOUNTER — Encounter: Payer: Self-pay | Admitting: Rheumatology

## 2019-06-02 NOTE — Telephone Encounter (Signed)
Patient may discontinue Actemra and we can schedule a follow-up appointment to discuss other treatment options.  I do not think that the symptoms are due to Actemra.  But if his arthritis is not well controlled we can switch medications.

## 2019-06-03 ENCOUNTER — Encounter: Payer: Self-pay | Admitting: Physician Assistant

## 2019-06-03 ENCOUNTER — Ambulatory Visit: Payer: BC Managed Care – PPO | Admitting: Rheumatology

## 2019-06-03 ENCOUNTER — Other Ambulatory Visit: Payer: Self-pay

## 2019-06-03 ENCOUNTER — Telehealth: Payer: Self-pay | Admitting: Pharmacist

## 2019-06-03 VITALS — BP 128/80 | HR 105 | Resp 14 | Ht 70.0 in | Wt 180.2 lb

## 2019-06-03 DIAGNOSIS — B360 Pityriasis versicolor: Secondary | ICD-10-CM

## 2019-06-03 DIAGNOSIS — M0609 Rheumatoid arthritis without rheumatoid factor, multiple sites: Secondary | ICD-10-CM

## 2019-06-03 DIAGNOSIS — M5030 Other cervical disc degeneration, unspecified cervical region: Secondary | ICD-10-CM

## 2019-06-03 DIAGNOSIS — R5383 Other fatigue: Secondary | ICD-10-CM

## 2019-06-03 DIAGNOSIS — Z79899 Other long term (current) drug therapy: Secondary | ICD-10-CM

## 2019-06-03 DIAGNOSIS — M25411 Effusion, right shoulder: Secondary | ICD-10-CM

## 2019-06-03 DIAGNOSIS — R200 Anesthesia of skin: Secondary | ICD-10-CM

## 2019-06-03 DIAGNOSIS — I10 Essential (primary) hypertension: Secondary | ICD-10-CM

## 2019-06-03 DIAGNOSIS — R202 Paresthesia of skin: Secondary | ICD-10-CM

## 2019-06-03 DIAGNOSIS — M25462 Effusion, left knee: Secondary | ICD-10-CM

## 2019-06-03 MED ORDER — RINVOQ 15 MG PO TB24: 15.0000 mg | ORAL_TABLET | Freq: Every day | ORAL | 0 refills | Status: DC

## 2019-06-03 NOTE — Telephone Encounter (Addendum)
Please start BIV for Rinvoq.  He has tried and failed oral methotrexate, Otrexup, Humira, Enbrel, and Actemra.

## 2019-06-03 NOTE — Telephone Encounter (Signed)
Attempted to contact the patient and left message for patient to call the office.  

## 2019-06-03 NOTE — Patient Instructions (Signed)
Standing Labs We placed an order today for your standing lab work.    Please come back and get your standing labs in 1 month and then every 3 months  We have open lab daily Monday through Thursday from 8:30-12:30 PM and 1:30-4:30 PM and Friday from 8:30-12:30 PM and 1:30-4:00 PM at the office of Dr. Shaili Deveshwar.   You may experience shorter wait times on Monday and Friday afternoons. The office is located at [address], [address], [place], [place] [address] No appointment is necessary.   Labs are drawn by [facility].  You may receive a bill from [facility] for your lab work.  If you wish to have your labs drawn at another location, please call the office 24 hours in advance to send orders.  If you have any questions regarding directions or hours of operation,  please call [phone].   Just as a reminder please drink plenty of water prior to coming for your lab work. Thanks!  

## 2019-06-03 NOTE — Progress Notes (Signed)
Office Visit Note  Patient: Benjamin Fields             Date of Birth: Sep 05, 1960           MRN: [id number]             PCP: Lennie Odor, PA-C Referring: Lennie Odor, PA-C Visit Date: 06/03/2019 Occupation: @GUAROCC @  Subjective:  Right shoulder joint pain   History of Present Illness: [name] is a 58 y.o. male with history of seronegative rheumatoid arthritis and DDD.  He was started on Actemra sq injections on 02/02/2019.  He has been injecting Actemra 162 mg every 14 days and has continued to inject Otrexup 20 mg subcutaneously every 7 days.  He reports that he continues to have persistent right shoulder joint pain.  He had a cortisone injection on 03/25/2019 and is currently on a prednisone taper.  He will take 10 mg of prednisone until Saturday and then reduce to 5 mg for 1 week.  He has not noticed much improvement in starting on Actemra.  He continues to have involuntary tremors in his lower extremities especially at night.  He has not followed up with a neurologist recently.  He is worried that Actemra is contributing to these lower extremity tremors and would like to discontinue.  He states that he has noticed an improvement in his anxiety and mood swings since starting on Cymbalta 60 mg by mouth daily.  Activities of Daily Living:  Patient reports morning stiffness for 1-2 hours.   Patient Reports nocturnal pain.  Difficulty dressing/grooming: Denies Difficulty climbing stairs: Denies Difficulty getting out of chair: Denies Difficulty using hands for taps, buttons, cutlery, and/or writing: Denies  Review of Systems  Constitutional: Positive for fatigue. Negative for night sweats.  HENT: Negative for mouth sores, mouth dryness and nose dryness.   Eyes: Positive for dryness. Negative for redness and visual disturbance.  Respiratory: Negative for cough, hemoptysis, shortness of breath, wheezing and difficulty breathing.   Cardiovascular: Negative for chest pain,  palpitations, hypertension, irregular heartbeat and swelling in legs/feet.  Gastrointestinal: Positive for constipation. Negative for abdominal pain, blood in stool and diarrhea.  Endocrine: Negative for increased urination.  Genitourinary: Positive for difficulty urinating. Negative for painful urination.  Musculoskeletal: Positive for arthralgias, joint pain, joint swelling, muscle weakness and morning stiffness. Negative for myalgias, muscle tenderness and myalgias.  Skin: Negative for color change, rash, hair loss, nodules/bumps, skin tightness, ulcers and sensitivity to sunlight.  Allergic/Immunologic: Negative for susceptible to infections.  Neurological: Positive for tremors, memory loss and weakness. Negative for dizziness, fainting, light-headedness, numbness, headaches and night sweats.  Hematological: Positive for bruising/bleeding tendency. Negative for swollen glands.  Psychiatric/Behavioral: Negative for depressed mood, confusion and sleep disturbance. The patient is not nervous/anxious.     PMFS History:  Patient Active Problem List   Diagnosis Date Noted  . Rheumatoid arthritis of multiple sites with negative rheumatoid factor ([place]) 05/19/2018  . Tinea versicolor 05/19/2018  . Essential hypertension 03/17/2018  . DDD (degenerative disc disease), cervical 03/16/2018    Past Medical History:  Diagnosis Date  . Hypertension   . RA (rheumatoid arthritis) (HCC)     Family History  Problem Relation Age of Onset  . Parkinson's disease Mother   . Prostate cancer Mother   . Atrial fibrillation Mother   . Heart failure Father   . Hypertension Father    Past Surgical History:  Procedure Laterality Date  . CERVICAL SPINE SURGERY  2010  .  TONSILLECTOMY     Social History   Social History Narrative   Lives home with wife.  workd at [employer]  Education: Masters.  Children 2.  Caffeine 2 cups daily   Immunization History  Administered Date(s) Administered  .  Influenza,inj,Quad PF,6+ Mos 07/06/2016     Objective: Vital Signs: BP 128/80 (BP Location: Left Arm, Patient Position: Sitting, Cuff Size: Normal)   Pulse (!) 105   Resp 14   Ht 5\' 10"  (1.778 m)   Wt 180 lb 3.2 oz (81.7 kg)   BMI 25.86 kg/m    Physical Exam Vitals signs and nursing note reviewed.  Constitutional:      Appearance: He is well-developed.  HENT:     Head: Normocephalic and atraumatic.  Eyes:     Conjunctiva/sclera: Conjunctivae normal.     Pupils: Pupils are equal, round, and reactive to light.  Neck:     Musculoskeletal: Normal range of motion and neck supple.  Cardiovascular:     Rate and Rhythm: Normal rate and regular rhythm.     Heart sounds: Normal heart sounds.  Pulmonary:     Effort: Pulmonary effort is normal.     Breath sounds: Normal breath sounds.  Abdominal:     General: Bowel sounds are normal.     Palpations: Abdomen is soft.  Skin:    General: Skin is warm and dry.     Capillary Refill: Capillary refill takes less than 2 seconds.  Neurological:     Mental Status: He is alert and oriented to person, place, and time.  Psychiatric:        Behavior: Behavior normal.      Musculoskeletal Exam: He has limited range of motion of the C-spine, thoracic spine, lumbar spine.  Right shoulder abduction to about 90 degrees.  Left shoulder has full range of motion with no discomfort.  He has tenderness of the right shoulder but no joint effusion was noted.  Elbow joints, wrist joints, MCPs, PIPs, DIPs good range of motion with no synovitis.  Hip joints, knee joints, ankle joints, MTPs and PIPs and DIPs good range of motion no synovitis.  No warmth or effusion of bilateral knee joints.  No tenderness or swelling of ankle joints.   CDAI Exam: CDAI Score: 1.8  Patient Global: 5 mm; Provider Global: 3 mm Swollen: 0 ; Tender: 1  Joint Exam      Right  Left  Glenohumeral   Tender        Investigation: No additional findings.  Imaging: No results  found.  Recent Labs: Lab Results  Component Value Date   WBC 4.6 03/25/2019   HGB 15.9 03/25/2019   PLT 177 03/25/2019   NA 140 03/25/2019   K 4.3 03/25/2019   CL 104 03/25/2019   CO2 28 03/25/2019   GLUCOSE 106 (H) 03/25/2019   BUN 15 03/25/2019   CREATININE 1.20 03/25/2019   BILITOT 1.3 (H) 03/25/2019   AST 19 03/25/2019   ALT 41 03/25/2019   PROT 6.0 (L) 03/25/2019   CALCIUM 10.1 03/25/2019   GFRAA 76 03/25/2019   QFTBGOLDPLUS NEGATIVE 03/25/2019    Speciality Comments: Prior therapy: Enbrel and Humira (inadequate reponse)  Procedures:  No procedures performed Allergies: Cortisone   Assessment / Plan:     Visit Diagnoses: Rheumatoid arthritis of multiple sites with negative rheumatoid factor ([place]) -He has no obvious synovitis on exam.  He continues to have persistent right shoulder joint pain.  No effusion was noted on  examination today but he does have tenderness and limited range of motion to about 90 degrees of abduction.  The left knee joint effusion has resolved.  He has no other joint pain or joint swelling at this time.  He is currently on prednisone 10 mg daily and is following the prednisone taper as prescribed.  He had a right shoulder joint cortisone injection on 03/25/2019 which provided temporary relief.  He has not been overly satisfied with the combination of Actemra and Otrexup 20 mg sq once weekly.  He previously had an adequate response to Enbrel and Humira.  We discussed switching him from Actemra to Rinvoq.  Indications, contraindications, potential side effects of Rinvoq were discussed.  All questions were addressed and consent was obtained today.  He will continue on Otrexup as prescribed.  He will follow-up in the office in 1 month.  Plan: Upadacitinib ER (RINVOQ) 15 MG TB24  Counseled patient that Rinvoq is a JAK inhibitor indicated for Rheumatoid Arthritis.  Counseled patient on purpose, proper use, and adverse effects of Rinvoq.    Reviewed the most common  adverse effects including infection, diarrhea, headaches.  Also reviewed rare adverse effects such as bowel injury and the need to contact us if they develop stomach pain during treatment. Counseled on the increase risk of venous thrombosis. Reviewed with patient that there is the possibility of an increased risk of malignancy but it is not well understood if this increased risk is due to the medication or the disease state.  Instructed patient that medication should be held for infection and prior to surgery.  Advised patient to avoid live vaccines. Recommend annual influenza, Pneumovax 23, Prevnar 13, and Shingrix as indicated.   Reviewed importance of routine lab monitoring including lipid panel.  Standing orders placed. Provided patient with medication education material and answered all questions.  Patient consented to Rinvoq.  Will upload into patient's chart.  Will apply through patient's insurance and update when we receive a response.    Patient dose will be 15 mg daily.  Prescription will be sent to pharmacy pending lab results and insurance approval.  High risk medication use - Actemra sq every 2 weeks and methotrexate 20 mg subcu weekly.  Inadequate response to Enbrel and Humira   Effusion of right shoulder: He has limited abduction to about 90 degrees.  He has tenderness but no joint effusion at this time.  He had a cortisone injection on 03/25/19 and is currently taking a prednisone taper.  He is taking prednisone 10 mg po daily for 2 more days and then will reduce to 5 mg po daily.    Effusion of left knee:  Resolved   DDD (degenerative disc disease), cervical: He has limited ROM.   Numbness and tingling of both feet -He continues to have persistent neuralgias and lower extremity involuntary movements in his lower extremities especially at night.  He was advised to follow-up with Dr. Leta Baptist for further evaluation.   Tinea versicolor: Resolved   Other fatigue: He has chronic  fatigue.   Essential hypertension  Orders: No orders of the defined types were placed in this encounter.  Meds ordered this encounter  Medications  . Upadacitinib ER (RINVOQ) 15 MG TB24    Sig: Take 15 mg by mouth daily.    Dispense:  14 tablet    Refill:  0    Face-to-face time spent with patient was 30 minutes. Greater than 50% of time was spent in counseling and coordination of care.  Follow-Up Instructions: Return in 4 weeks (on 07/01/2019) for Rheumatoid arthritis, DDD.   Hazel Sams PA-C  I examined and evaluated the patient with Hazel Sams PA.  Patient states he has been having a lot of pain and discomfort.  He has been having ongoing discomfort in his right shoulder.  He is on prednisone taper currently.  He believes Actemra has not been effective for him.  I detailed discussion with him regarding different treatment options and their side effects.  We decided to proceed with Rinvoq.he was in agreement.  The plan of care was discussed as noted above.  Bo Merino, MD  Note - This record has been created using Editor, commissioning.  Chart creation errors have been sought, but may not always  have been located. Such creation errors do not reflect on  the standard of medical care.

## 2019-06-03 NOTE — Progress Notes (Signed)
Pharmacy Note  Subjective: Patient presents today to the [facility] to see Dr. Estanislado Pandy.  Patient seen by the pharmacist for counseling on Rinvoq for rheumatoid arthritis. He is currently on Actemra and Otrexup with inadequate response. Previous therapy include: Enbrel and Humira.  Objective:  CMP     Component Value Date/Time   NA 140 03/25/2019 1152   K 4.3 03/25/2019 1152   CL 104 03/25/2019 1152   CO2 28 03/25/2019 1152   GLUCOSE 106 (H) 03/25/2019 1152   BUN 15 03/25/2019 1152   CREATININE 1.20 03/25/2019 1152   CALCIUM 10.1 03/25/2019 1152   PROT 6.0 (L) 03/25/2019 1152   AST 19 03/25/2019 1152   ALT 41 03/25/2019 1152    CBC    Component Value Date/Time   WBC 4.6 03/25/2019 1152   RBC 4.71 03/25/2019 1152   HGB 15.9 03/25/2019 1152   HCT 45.9 03/25/2019 1152   PLT 177 03/25/2019 1152   MCV 97.5 03/25/2019 1152   MCH 33.8 (H) 03/25/2019 1152   MCHC 34.6 03/25/2019 1152   RDW 14.1 03/25/2019 1152    Baseline Immunosuppressant Therapy Labs TB GOLD Quantiferon TB Gold Latest Ref Rng & Units 03/25/2019  Quantiferon TB Gold Plus NEGATIVE NEGATIVE   Hepatitis Panel Hepatitis Latest Ref Rng & Units 03/17/2018  Hep B Surface Ag NON-REACTI NON-REACTIVE  Hep B IgM NON-REACTI NON-REACTIVE  Hep C Ab NON-REACTI NON-REACTIVE  Hep C Ab NON-REACTI NON-REACTIVE   HIV Lab Results  Component Value Date   HIV NON-REACTIVE 03/17/2018   Immunoglobulins Immunoglobulin Electrophoresis Latest Ref Rng & Units 03/17/2018  IgA  47 - 310 mg/dL 93  IgG 600 - 1,640 mg/dL 548(L)  IgM 50 - 300 mg/dL 78   SPEP Serum Protein Electrophoresis Latest Ref Rng & Units 03/25/2019  Total Protein 6.1 - 8.1 g/dL 6.0(L)  Albumin 3.8 - 4.8 g/dL -  Alpha-1 0.2 - 0.3 g/dL -  Alpha-2 0.5 - 0.9 g/dL -  Beta Globulin 0.4 - 0.6 g/dL -  Beta 2 0.2 - 0.5 g/dL -  Gamma Globulin 0.8 - 1.7 g/dL -   G6PD Lab Results  Component Value Date   G6PDH 13.9 03/17/2018   TPMT No results  found for: TPMT   Lipid Panel Lab Results  Component Value Date   CHOL 197 03/25/2019   HDL 39 (L) 03/25/2019   LDLCALC 121 (H) 03/25/2019   TRIG 241 (H) 03/25/2019   CHOLHDL 5.1 (H) 03/25/2019     Does patient have history of diverticulitis?  No  Assessment/Plan:  Counseled patient that Rinvoq is a JAK inhibitor indicated for Rheumatoid Arthritis.  Counseled patient on purpose, proper use, and adverse effects of Rinvoq.    Reviewed the most common adverse effects including infection, diarrhea, headaches.  Also reviewed rare adverse effects such as bowel injury and the need to contact us if they develop stomach pain during treatment. Counseled on the increase risk of venous thrombosis. Reviewed with patient that there is the possibility of an increased risk of malignancy but it is not well understood if this increased risk is due to the medication or the disease state.  Instructed patient that medication should be held for infection and prior to surgery.  Advised patient to avoid live vaccines. Recommend annual influenza, Pneumovax 23, Prevnar 13, and Shingrix as indicated.    Reviewed the importance of routine lab monitoring including lipid panel.  Standing orders placed. Provided patient with medication education material and answered all questions.  Patient consented to Rinvoq.  Will upload into patient's chart.  Will apply through patient's insurance and update when we receive a response.  Patient dose will be 15 mg daily.  Prescription will be sent to pharmacy pending lab results and insurance approval.  Medication Samples have been provided to the patient.  Drug name: Rinvoq  Strength: 15mg         Qty: 14   LOT: [id number]   Exp.Date: 11/26/2019  Dosing instructions: Take 1 tablet daily  All questions encouraged and answered.  Instructed patient to call with any other questions or concerns.  Mariella Saa, PharmD, [place], [place] Clinical Specialty Pharmacist [phone]  06/03/2019  4:17 PM

## 2019-06-03 NOTE — Telephone Encounter (Signed)
Patient has been scheduled for an appointment for 06/03/19 at 3:00 pm to discuss treatment options.

## 2019-06-04 NOTE — Telephone Encounter (Signed)
Patient has a $200 copay for 1 month supply. Patient is eligible for Rinvoq copay card.

## 2019-06-04 NOTE — Telephone Encounter (Signed)
Received notification from [facility] regarding a prior authorization for [facility]. Authorization has been APPROVED from 06/04/2019 to 06/02/2022. Patient is copay card eligible.  Will send document to scan center.  Authorization # [id number]    8:37 AM Beatriz Chancellor, CPhT

## 2019-06-08 MED ORDER — RINVOQ 15 MG PO TB24: 15.0000 mg | ORAL_TABLET | Freq: Every day | ORAL | 0 refills | Status: DC

## 2019-06-08 NOTE — Telephone Encounter (Signed)
Scheduled patient's 1st Rinvoq shipment from [facility] to mail to patient on 06/16/19.

## 2019-06-08 NOTE — Telephone Encounter (Signed)
Called patient.  No answer.  Left voicemail notifying of approval and prescription will be sent to the pharmacy.  He was given a co-pay card at office visit.   Mariella Saa, PharmD, [place], [place] Clinical Specialty Pharmacist [phone]  06/08/2019 9:11 AM

## 2019-06-14 ENCOUNTER — Other Ambulatory Visit: Payer: Self-pay | Admitting: Rheumatology

## 2019-06-14 NOTE — Telephone Encounter (Signed)
Last Visit: 06/03/19  Next Visit: 07/08/19  Okay to refill per Dr. Estanislado Pandy

## 2019-06-17 ENCOUNTER — Encounter: Payer: Self-pay | Admitting: Rheumatology

## 2019-06-24 DIAGNOSIS — M069 Rheumatoid arthritis, unspecified: Secondary | ICD-10-CM | POA: Diagnosis not present

## 2019-06-24 DIAGNOSIS — H04123 Dry eye syndrome of bilateral lacrimal glands: Secondary | ICD-10-CM | POA: Diagnosis not present

## 2019-06-24 DIAGNOSIS — H10413 Chronic giant papillary conjunctivitis, bilateral: Secondary | ICD-10-CM | POA: Diagnosis not present

## 2019-06-24 NOTE — Progress Notes (Signed)
Office Visit Note  Patient: Benjamin Fields             Date of Birth: 1960-09-12           MRN: [id number]             PCP: Lennie Odor, PA-C Referring: Lennie Odor, PA-C Visit Date: 07/08/2019 Occupation: @GUAROCC @  Subjective:  Right shoulder pain    History of Present Illness: Benjamin Fields is a 58 y.o. male with history of  He is taking Rinvoq 15 mg 1 tablet daily, Otrexup 20 mg sq once weekly, and folic acid 2 mg po daily.  He has noticed improvement since starting on Cymbalta 60 mg po daily.  He states he is having more good days than bad days recently.  He continues to have discomfort in the right shoulder joint but denies any joint swelling. He denies any other joint pain or joint swelling at this time.  He has been walking for exercise.  He states he recently had to take diflucan for a fungal infection on both feet.  He states he did not hold Rinvoq or oxtreup while taking it.  He states the infection has not completely cleared and he is going to call his PCP for a topical agent.   Activities of Daily Living:  Patient reports morning stiffness for 1-1.5 hours.   Patient Denies nocturnal pain.  Difficulty dressing/grooming: Denies Difficulty climbing stairs: Denies Difficulty getting out of chair: Denies Difficulty using hands for taps, buttons, cutlery, and/or writing: Denies  Review of Systems  Constitutional: Positive for fatigue. Negative for night sweats.  HENT: Positive for mouth sores. Negative for mouth dryness and nose dryness.   Eyes: Negative for redness, itching and dryness.  Respiratory: Negative for cough, hemoptysis, shortness of breath, wheezing and difficulty breathing.   Cardiovascular: Negative for chest pain, palpitations, hypertension, irregular heartbeat and swelling in legs/feet.  Gastrointestinal: Positive for constipation and diarrhea. Negative for blood in stool.  Endocrine: Negative for increased urination.  Genitourinary: Negative for  painful urination.  Musculoskeletal: Positive for arthralgias, joint pain, joint swelling and morning stiffness. Negative for myalgias, muscle weakness, muscle tenderness and myalgias.  Skin: Positive for rash. Negative for color change, hair loss, nodules/bumps, skin tightness, ulcers and sensitivity to sunlight.  Allergic/Immunologic: Negative for susceptible to infections.  Neurological: Negative for fainting, light-headedness, numbness, headaches, memory loss and night sweats.  Hematological: Negative for swollen glands.  Psychiatric/Behavioral: Negative for depressed mood, confusion and sleep disturbance. The patient is not nervous/anxious.     PMFS History:  Patient Active Problem List   Diagnosis Date Noted  . Rheumatoid arthritis of multiple sites with negative rheumatoid factor ([place]) 05/19/2018  . Tinea versicolor 05/19/2018  . Essential hypertension 03/17/2018  . DDD (degenerative disc disease), cervical 03/16/2018    Past Medical History:  Diagnosis Date  . Hypertension   . RA (rheumatoid arthritis) (HCC)     Family History  Problem Relation Age of Onset  . Parkinson's disease Mother   . Prostate cancer Mother   . Atrial fibrillation Mother   . Heart failure Father   . Hypertension Father    Past Surgical History:  Procedure Laterality Date  . CERVICAL SPINE SURGERY  2010  . TONSILLECTOMY     Social History   Social History Narrative   Lives home with wife.  workd at [employer]  Education: Masters.  Children 2.  Caffeine 2 cups daily   Immunization History  Administered Date(s) Administered  .  Influenza,inj,Quad PF,6+ Mos 07/06/2016     Objective: Vital Signs: BP 140/86 (BP Location: Left Arm, Patient Position: Sitting, Cuff Size: Normal)   Pulse 76   Resp 15   Ht 5\' 10"  (1.778 m)   Wt 185 lb (83.9 kg)   BMI 26.54 kg/m    Physical Exam Vitals signs and nursing note reviewed.  Constitutional:      Appearance: He is well-developed.  HENT:     Head:  Normocephalic and atraumatic.  Eyes:     Conjunctiva/sclera: Conjunctivae normal.     Pupils: Pupils are equal, round, and reactive to light.  Neck:     Musculoskeletal: Normal range of motion and neck supple.  Cardiovascular:     Rate and Rhythm: Normal rate and regular rhythm.     Heart sounds: Normal heart sounds.  Pulmonary:     Effort: Pulmonary effort is normal.     Breath sounds: Normal breath sounds.  Abdominal:     General: Bowel sounds are normal.     Palpations: Abdomen is soft.  Skin:    General: Skin is warm and dry.     Capillary Refill: Capillary refill takes less than 2 seconds.  Neurological:     Mental Status: He is alert and oriented to person, place, and time.  Psychiatric:        Behavior: Behavior normal.      Musculoskeletal Exam: C-spine, thoracic spine, and lumbar spine good ROM.  No midline spinal tenderness.  No SI joint tenderness.  Shoulder joints, elbow joints, wrist joints, MCPs, PIPs, and DIPs good ROM with no synovitis.  Hip joints, knee joints, ankle joints, MTPs, PIPs, and DIPs good ROM with no synovitis.  No warmth or effusion of knee joints.  No tenderness or swelling of ankle joints.   CDAI Exam: CDAI Score: - Patient Global: -; Provider Global: - Swollen: -; Tender: - Joint Exam   No joint exam has been documented for this visit   There is currently no information documented on the homunculus. Go to the Rheumatology activity and complete the homunculus joint exam.  Investigation: No additional findings.  Imaging: No results found.  Recent Labs: Lab Results  Component Value Date   WBC 4.6 03/25/2019   HGB 15.9 03/25/2019   PLT 177 03/25/2019   NA 140 03/25/2019   K 4.3 03/25/2019   CL 104 03/25/2019   CO2 28 03/25/2019   GLUCOSE 106 (H) 03/25/2019   BUN 15 03/25/2019   CREATININE 1.20 03/25/2019   BILITOT 1.3 (H) 03/25/2019   AST 19 03/25/2019   ALT 41 03/25/2019   PROT 6.0 (L) 03/25/2019   CALCIUM 10.1 03/25/2019    GFRAA 76 03/25/2019   QFTBGOLDPLUS NEGATIVE 03/25/2019    Speciality Comments: Prior therapy: Enbrel and Humira (inadequate reponse)  Procedures:  No procedures performed Allergies: Cortisone   Assessment / Plan:     Visit Diagnoses: Rheumatoid arthritis of multiple sites with negative rheumatoid factor ([place]): He has no synovitis on exam.  He continues to have occasional discomfort in the right shoulder joint but has good range of motion with no tenderness or effusion on exam today.  He is clinically doing well on Rinvoq 15 mg 1 tablet by mouth daily, Otrexup 20 mg sq injections once weekly, and folic acid 2 mg by mouth daily.  He has also noticed improvement in starting on Cymbalta 60 mg 1 capsule daily.  He will continue on his current treatment regimen.  He was advised to  notify us if he develops increased joint pain or joint swelling.  He will follow-up in the office in 5 months  High risk medication use - Rinvoq 15 mg 1 tablet daily started in October 2020, Otrexup 20 mg every 7 days, and folic acid 1 mg 2 tablets daily.  Last TB gold negative on 03/25/2019 and will monitor yearly.  Last lipid panel  Showed elevated LDL on 03/25/2019 and will monitor yearly.  Most recent CBC/CMP within normal limits on 03/25/2019.  Due for CBC/CMP today and will monitor every 3 months.  (inadequate response to Enbrel and Humira) - Plan: CMP, CBC He was reminded to hold Otrexup and Rinvoq if he develops any signs or symptoms of an infection and to resume once the infection has completely cleared.  He continues to have recurrent fungal infections on both feet and was advised to hold his medications while on antifungals.  Effusion of right shoulder: Resolved.  He has good ROM with no discomfort.  No warmth or effusion of the right shoulder joint on exam.   Effusion of left knee: Resolved.  He has good ROM with no discomfort.  No warmth or effusion noted.   DDD (degenerative disc disease), cervical: He has good ROM  with no discomfort. No symptoms of radiculopathy at this time.   Other medical conditions are listed as follows:   Numbness and tingling of both feet  Tinea versicolor  Other fatigue  Essential hypertension  Orders: Orders Placed This Encounter  Procedures  . CMP  . CBC   No orders of the defined types were placed in this encounter.   Follow-Up Instructions: Return in about 5 months (around 12/06/2019) for Rheumatoid arthritis, DDD.   Ofilia Neas, PA-C   I examined and evaluated the patient with Hazel Sams PA.  Patient is doing much better on Rinvoq and Rasuvo combination.  He had no synovitis on my examination.  He continues to have good days and bad days.  It appears that he might be doing too many activities when he feels good.  I have advised him to cut back on the aggressive activities like cutting trees.  He still continues to have some fungal infection on his feet.  Have advised him to contact his dermatologist.  He may benefit from some topical agent.  He has responded well to Cymbalta.  The plan of care was discussed as noted above.  Bo Merino, MD  Note - This record has been created using Editor, commissioning.  Chart creation errors have been sought, but may not always  have been located. Such creation errors do not reflect on  the standard of medical care.

## 2019-06-25 ENCOUNTER — Ambulatory Visit: Payer: BC Managed Care – PPO | Admitting: Rheumatology

## 2019-06-30 ENCOUNTER — Other Ambulatory Visit: Payer: Self-pay

## 2019-06-30 ENCOUNTER — Encounter: Payer: Self-pay | Admitting: Family Medicine

## 2019-06-30 ENCOUNTER — Ambulatory Visit: Payer: BC Managed Care – PPO | Admitting: Family Medicine

## 2019-06-30 VITALS — BP 137/88 | HR 71 | Temp 97.4°F | Ht 70.0 in | Wt 184.2 lb

## 2019-06-30 DIAGNOSIS — M0609 Rheumatoid arthritis without rheumatoid factor, multiple sites: Secondary | ICD-10-CM | POA: Diagnosis not present

## 2019-06-30 DIAGNOSIS — R258 Other abnormal involuntary movements: Secondary | ICD-10-CM

## 2019-06-30 DIAGNOSIS — G629 Polyneuropathy, unspecified: Secondary | ICD-10-CM

## 2019-06-30 MED ORDER — GABAPENTIN 100 MG PO CAPS: 100.0000 mg | ORAL_CAPSULE | Freq: Three times a day (TID) | ORAL | 11 refills | Status: DC

## 2019-06-30 NOTE — Patient Instructions (Signed)
We will continue Cymbalta 60mg  daily.   Add gabapentin 100mg  capsule three times daily (start 1 capsule at bedtime for 4 days, then 1 capsule twice daily for 4 days then 1 capsule three times daily)  Follow up in 6 months, sooner if needed    Gabapentin capsules or tablets What is this medicine? GABAPENTIN (GA ba pen tin) is used to control seizures in certain types of epilepsy. It is also used to treat certain types of nerve pain. This medicine may be used for other purposes; ask your health care provider or pharmacist if you have questions. COMMON BRAND NAME(S): Active-PAC with Gabapentin, Gabarone, Neurontin What should I tell my health care provider before I take this medicine? They need to know if you have any of these conditions:  history of drug abuse or alcohol abuse problem  kidney disease  lung or breathing disease  suicidal thoughts, plans, or attempt; a previous suicide attempt by you or a family member  an unusual or allergic reaction to gabapentin, other medicines, foods, dyes, or preservatives  pregnant or trying to get pregnant  breast-feeding How should I use this medicine? Take this medicine by mouth with a glass of water. Follow the directions on the prescription label. You can take it with or without food. If it upsets your stomach, take it with food. Take your medicine at regular intervals. Do not take it more often than directed. Do not stop taking except on your doctor's advice. If you are directed to break the 600 or 800 mg tablets in half as part of your dose, the extra half tablet should be used for the next dose. If you have not used the extra half tablet within 28 days, it should be thrown away. A special MedGuide will be given to you by the pharmacist with each prescription and refill. Be sure to read this information carefully each time. Talk to your pediatrician regarding the use of this medicine in children. While this drug may be prescribed for  children as young as 3 years for selected conditions, precautions do apply. Overdosage: If you think you have taken too much of this medicine contact a poison control center or emergency room at once. NOTE: This medicine is only for you. Do not share this medicine with others. What if I miss a dose? If you miss a dose, take it as soon as you can. If it is almost time for your next dose, take only that dose. Do not take double or extra doses. What may interact with this medicine? This medicine may interact with the following medications:  alcohol  antihistamines for allergy, cough, and cold  certain medicines for anxiety or sleep  certain medicines for depression like amitriptyline, fluoxetine, sertraline  certain medicines for seizures like phenobarbital, primidone  certain medicines for stomach problems  general anesthetics like halothane, isoflurane, methoxyflurane, propofol  local anesthetics like lidocaine, pramoxine, tetracaine  medicines that relax muscles for surgery  narcotic medicines for pain  phenothiazines like chlorpromazine, mesoridazine, prochlorperazine, thioridazine This list may not describe all possible interactions. Give your health care provider a list of all the medicines, herbs, non-prescription drugs, or dietary supplements you use. Also tell them if you smoke, drink alcohol, or use illegal drugs. Some items may interact with your medicine. What should I watch for while using this medicine? Visit your doctor or health care provider for regular checks on your progress. You may want to keep a record at home of how you feel your  condition is responding to treatment. You may want to share this information with your doctor or health care provider at each visit. You should contact your doctor or health care provider if your seizures get worse or if you have any new types of seizures. Do not stop taking this medicine or any of your seizure medicines unless instructed  by your doctor or health care provider. Stopping your medicine suddenly can increase your seizures or their severity. This medicine may cause serious skin reactions. They can happen weeks to months after starting the medicine. Contact your health care provider right away if you notice fevers or flu-like symptoms with a rash. The rash may be red or purple and then turn into blisters or peeling of the skin. Or, you might notice a red rash with swelling of the face, lips or lymph nodes in your neck or under your arms. Wear a medical identification bracelet or chain if you are taking this medicine for seizures, and carry a card that lists all your medications. You may get drowsy, dizzy, or have blurred vision. Do not drive, use machinery, or do anything that needs mental alertness until you know how this medicine affects you. To reduce dizzy or fainting spells, do not sit or stand up quickly, especially if you are an older patient. Alcohol can increase drowsiness and dizziness. Avoid alcoholic drinks. Your mouth may get dry. Chewing sugarless gum or sucking hard candy, and drinking plenty of water will help. The use of this medicine may increase the chance of suicidal thoughts or actions. Pay special attention to how you are responding while on this medicine. Any worsening of mood, or thoughts of suicide or dying should be reported to your health care provider right away. Women who become pregnant while using this medicine may enroll in the [place] Pregnancy Registry by calling [phone]. This registry collects information about the safety of antiepileptic drug use during pregnancy. What side effects may I notice from receiving this medicine? Side effects that you should report to your doctor or health care professional as soon as possible:  allergic reactions like skin rash, itching or hives, swelling of the face, lips, or tongue  breathing problems  rash, fever, and swollen  lymph nodes  redness, blistering, peeling or loosening of the skin, including inside the mouth  suicidal thoughts, mood changes Side effects that usually do not require medical attention (report to your doctor or health care professional if they continue or are bothersome):  dizziness  drowsiness  headache  nausea, vomiting  swelling of ankles, feet, hands  tiredness This list may not describe all possible side effects. Call your doctor for medical advice about side effects. You may report side effects to FDA at 1-800-FDA-1088. Where should I keep my medicine? Keep out of reach of children. This medicine may cause accidental overdose and death if it taken by other adults, children, or pets. Mix any unused medicine with a substance like cat litter or coffee grounds. Then throw the medicine away in a sealed container like a sealed bag or a coffee can with a lid. Do not use the medicine after the expiration date. Store at room temperature between 15 and 30 degrees C (59 and 86 degrees F). NOTE: This sheet is a summary. It may not cover all possible information. If you have questions about this medicine, talk to your doctor, pharmacist, or health care provider.  2020 Elsevier/Gold Standard (2018-11-13 14:16:43)   Peripheral Neuropathy Peripheral neuropathy is  a type of nerve damage. It affects nerves that carry signals between the spinal cord and the arms, legs, and the rest of the body (peripheral nerves). It does not affect nerves in the spinal cord or brain. In peripheral neuropathy, one nerve or a group of nerves may be damaged. Peripheral neuropathy is a broad category that includes many specific nerve disorders, like diabetic neuropathy, hereditary neuropathy, and carpal tunnel syndrome. What are the causes? This condition may be caused by:  Diabetes. This is the most common cause of peripheral neuropathy.  Nerve injury.  Pressure or stress on a nerve that lasts a long  time.  Lack (deficiency) of B vitamins. This can result from alcoholism, poor diet, or a restricted diet.  Infections.  Autoimmune diseases, such as rheumatoid arthritis and systemic lupus erythematosus.  Nerve diseases that are passed from parent to child (inherited).  Some medicines, such as cancer medicines (chemotherapy).  Poisonous (toxic) substances, such as lead and mercury.  Too little blood flowing to the legs.  Kidney disease.  Thyroid disease. In some cases, the cause of this condition is not known. What are the signs or symptoms? Symptoms of this condition depend on which of your nerves is damaged. Common symptoms include:  Loss of feeling (numbness) in the feet, hands, or both.  Tingling in the feet, hands, or both.  Burning pain.  Very sensitive skin.  Weakness.  Not being able to move a part of the body (paralysis).  Muscle twitching.  Clumsiness or poor coordination.  Loss of balance.  Not being able to control your bladder.  Feeling dizzy.  Sexual problems. How is this diagnosed? Diagnosing and finding the cause of peripheral neuropathy can be difficult. Your health care provider will take your medical history and do a physical exam. A neurological exam will also be done. This involves checking things that are affected by your brain, spinal cord, and nerves (nervous system). For example, your health care provider will check your reflexes, how you move, and what you can feel. You may have other tests, such as:  Blood tests.  Electromyogram (EMG) and nerve conduction tests. These tests check nerve function and how well the nerves are controlling the muscles.  Imaging tests, such as CT scans or MRI to rule out other causes of your symptoms.  Removing a small piece of nerve to be examined in a lab (nerve biopsy). This is rare.  Removing and examining a small amount of the fluid that surrounds the brain and spinal cord (lumbar puncture). This is  rare. How is this treated? Treatment for this condition may involve:  Treating the underlying cause of the neuropathy, such as diabetes, kidney disease, or vitamin deficiencies.  Stopping medicines that can cause neuropathy, such as chemotherapy.  Medicine to relieve pain. Medicines may include: ? Prescription or over-the-counter pain medicine. ? Antiseizure medicine. ? Antidepressants. ? Pain-relieving patches that are applied to painful areas of skin.  Surgery to relieve pressure on a nerve or to destroy a nerve that is causing pain.  Physical therapy to help improve movement and balance.  Devices to help you move around (assistive devices). Follow these instructions at home: Medicines  Take over-the-counter and prescription medicines only as told by your health care provider. Do not take any other medicines without first asking your health care provider.  Do not drive or use heavy machinery while taking prescription pain medicine. Lifestyle   Do not use any products that contain nicotine or tobacco, such as  cigarettes and e-cigarettes. Smoking keeps blood from reaching damaged nerves. If you need help quitting, ask your health care provider.  Avoid or limit alcohol. Too much alcohol can cause a vitamin B deficiency, and vitamin B is needed for healthy nerves.  Eat a healthy diet. This includes: ? Eating foods that are high in fiber, such as fresh fruits and vegetables, whole grains, and beans. ? Limiting foods that are high in fat and processed sugars, such as fried or sweet foods. General instructions   If you have diabetes, work closely with your health care provider to keep your blood sugar under control.  If you have numbness in your feet: ? Check every day for signs of injury or infection. Watch for redness, warmth, and swelling. ? Wear padded socks and comfortable shoes. These help protect your feet.  Develop a good support system. Living with peripheral  neuropathy can be stressful. Consider talking with a mental health specialist or joining a support group.  Use assistive devices and attend physical therapy as told by your health care provider. This may include using a walker or a cane.  Keep all follow-up visits as told by your health care provider. This is important. Contact a health care provider if:  You have new signs or symptoms of peripheral neuropathy.  You are struggling emotionally from dealing with peripheral neuropathy.  Your pain is not well-controlled. Get help right away if:  You have an injury or infection that is not healing normally.  You develop new weakness in an arm or leg.  You fall frequently. Summary  Peripheral neuropathy is when the nerves in the arms, or legs are damaged, resulting in numbness, weakness, or pain.  There are many causes of peripheral neuropathy, including diabetes, pinched nerves, vitamin deficiencies, autoimmune disease, and hereditary conditions.  Diagnosing and finding the cause of peripheral neuropathy can be difficult. Your health care provider will take your medical history, do a physical exam, and do tests, including blood tests and nerve function tests.  Treatment involves treating the underlying cause of the neuropathy and taking medicines to help control pain. Physical therapy and assistive devices may also help. This information is not intended to replace advice given to you by your health care provider. Make sure you discuss any questions you have with your health care provider. Document Released: 08/02/2002 Document Revised: 07/25/2017 Document Reviewed: 10/21/2016 Elsevier Patient Education  2020 [employer].   Restless Legs Syndrome Restless legs syndrome is a condition that causes uncomfortable feelings or sensations in the legs, especially while sitting or lying down. The sensations usually cause an overwhelming urge to move the legs. The arms can also sometimes be  affected. The condition can range from mild to severe. The symptoms often interfere with a person's ability to sleep. What are the causes? The cause of this condition is not known. What increases the risk? The following factors may make you more likely to develop this condition:  Being older than 50.  Pregnancy.  Being a woman. In general, the condition is more common in women than in men.  A family history of the condition.  Having iron deficiency.  Overuse of caffeine, nicotine, or alcohol.  Certain medical conditions, such as kidney disease, Parkinson's disease, or nerve damage.  Certain medicines, such as those for high blood pressure, nausea, colds, allergies, depression, and some heart conditions. What are the signs or symptoms? The main symptom of this condition is uncomfortable sensations in the legs, such as:  Pulling.  Tingling.  Prickling.  Throbbing.  Crawling.  Burning. Usually, the sensations:  Affect both sides of the body.  Are worse when you sit or lie down.  Are worse at night. These may wake you up or make it difficult to fall asleep.  Make you have a strong urge to move your legs.  Are temporarily relieved by moving your legs. The arms can also be affected, but this is rare. People who have this condition often have tiredness during the day because of their lack of sleep at night. How is this diagnosed? This condition may be diagnosed based on:  Your symptoms.  Blood tests. In some cases, you may be monitored in a sleep lab by a specialist (a sleep study). This can detect any disruptions in your sleep. How is this treated? This condition is treated by managing the symptoms. This may include:  Lifestyle changes, such as exercising, using relaxation techniques, and avoiding caffeine, alcohol, or tobacco.  Medicines. Anti-seizure medicines may be tried first. Follow these instructions at home:     General instructions  Take  over-the-counter and prescription medicines only as told by your health care provider.  Use methods to help relieve the uncomfortable sensations, such as: ? Massaging your legs. ? Walking or stretching. ? Taking a cold or hot bath.  Keep all follow-up visits as told by your health care provider. This is important. Lifestyle  Practice good sleep habits. For example, go to bed and get up at the same time every day. Most adults should get 7-9 hours of sleep each night.  Exercise regularly. Try to get at least 30 minutes of exercise most days of the week.  Practice ways of relaxing, such as yoga or meditation.  Avoid caffeine and alcohol.  Do not use any products that contain nicotine or tobacco, such as cigarettes and e-cigarettes. If you need help quitting, ask your health care provider. Contact a health care provider if:  Your symptoms get worse or they do not improve with treatment. Summary  Restless legs syndrome is a condition that causes uncomfortable feelings or sensations in the legs, especially while sitting or lying down.  The symptoms often interfere with a person's ability to sleep.  This condition is treated by managing the symptoms. You may need to make lifestyle changes or take medicines. This information is not intended to replace advice given to you by your health care provider. Make sure you discuss any questions you have with your health care provider. Document Released: 08/02/2002 Document Revised: 09/01/2017 Document Reviewed: 09/01/2017 Elsevier Patient Education  2020 [employer].

## 2019-06-30 NOTE — Progress Notes (Signed)
PATIENT: Benjamin Fields DOB: 16-Jun-1961  REASON FOR VISIT: follow up HISTORY FROM: patient  Chief Complaint  Patient presents with  . Follow-up    room 2, alone. Wants to know results of MRI. Twitching/jerking in legs and back.      HISTORY OF PRESENT ILLNESS: Today 06/30/19 Benjamin Fields is a 58 y.o. male here today for follow up for polyneuropathy. He continues Cymbalta 60mg  daily. He feels that this helps with his mood. Burning pain seems stable from last visit. Over the past 2-3 weeks, he has had more jerking and twitching of his legs. This worsens at night. Sometimes it wakes him at night. He is followed by rheumatology regularly. He is taking Otreup and Rinvoq for RA.    HISTORY: (copied from Dr Gladstone Lighter note on 09/04/2018)  58 year old male here for evaluation of numbness and tingling.  May 2019 patient had onset of left knee and right shoulder pain and swelling.  He was diagnosed with rheumatoid arthritis. He also had red eyes, dry eyes and generalized pain.  He has been started on methotrexate in July 2019 and Enbrel in November 2019.  He is also been on prednisone.   Early on his diagnosis he noticed some tingling sensation in his right hand.  He also notes that in his right leg.  He has mild numbness and tingling on the left side as well.  No sensations in his face, neck or lower back.   REVIEW OF SYSTEMS: Out of a complete 14 system review of symptoms, the patient complains only of the following symptoms, numbness, tingling, restless legs at night and all other reviewed systems are negative.  ALLERGIES: Allergies  Allergen Reactions  . Cortisone     FLARE    HOME MEDICATIONS: Outpatient Medications Prior to Visit  Medication Sig Dispense Refill  . DULoxetine (CYMBALTA) 60 MG capsule Take 1 capsule (60 mg total) by mouth daily. 30 capsule 2  . folic acid (FOLVITE) 1 MG tablet Take 2 tablets by mouth once daily 180 tablet 0  . losartan (COZAAR) 25 MG  tablet Take 25 mg by mouth daily. for high blood pressure  1  . Multiple Vitamin (MULTIVITAMIN) tablet Take 1 tablet by mouth daily. OTC    . OTREXUP 20 MG/0.4ML SOAJ INJECT 20 MG INTO THE SKIN ONCE A WEEK AS DIRECTED 12 mL 0  . Upadacitinib ER (RINVOQ) 15 MG TB24 Take 15 mg by mouth daily. 14 tablet 0  . ondansetron (ZOFRAN) 4 MG tablet Take 1 tablet (4 mg total) by mouth every 6 (six) hours as needed for nausea or vomiting. 30 tablet 2  . predniSONE (DELTASONE) 5 MG tablet Take 4 tablets by mouth daily x1 wk, 3 tablets by mouth daily x1wk, 2 tablets by mouth daily x1wk, 1 tablet by mouth daily x1wk. 70 tablet 0  . tiZANidine (ZANAFLEX) 4 MG tablet Take 1 tablet (4 mg total) by mouth at bedtime. (Patient taking differently: Take 4 mg by mouth at bedtime as needed. ) 30 tablet 0  . traMADol (ULTRAM) 50 MG tablet TAKE 2 TABLETS BY MOUTH EVERY 6 HOURS AS NEEDED FOR  UP  TO  7  DAYS 40 tablet 0  . Upadacitinib ER (RINVOQ) 15 MG TB24 Take 15 mg by mouth daily. 90 tablet 0   No facility-administered medications prior to visit.     PAST MEDICAL HISTORY: Past Medical History:  Diagnosis Date  . Hypertension   . RA (rheumatoid arthritis) ([place])  PAST SURGICAL HISTORY: Past Surgical History:  Procedure Laterality Date  . CERVICAL SPINE SURGERY  2010  . TONSILLECTOMY      FAMILY HISTORY: Family History  Problem Relation Age of Onset  . Parkinson's disease Mother   . Prostate cancer Mother   . Atrial fibrillation Mother   . Heart failure Father   . Hypertension Father     SOCIAL HISTORY: Social History   Socioeconomic History  . Marital status: Single    Spouse name: Not on file  . Number of children: Not on file  . Years of education: Not on file  . Highest education level: Not on file  Occupational History  . Not on file  Social Needs  . Financial resource strain: Not on file  . Food insecurity    Worry: Not on file    Inability: Not on file  . Transportation needs     Medical: Not on file    Non-medical: Not on file  Tobacco Use  . Smoking status: Never Smoker  . Smokeless tobacco: Never Used  Substance and Sexual Activity  . Alcohol use: Never    Frequency: Never  . Drug use: Never  . Sexual activity: Not on file  Lifestyle  . Physical activity    Days per week: Not on file    Minutes per session: Not on file  . Stress: Not on file  Relationships  . Social Herbalist on phone: Not on file    Gets together: Not on file    Attends religious service: Not on file    Active member of club or organization: Not on file    Attends meetings of clubs or organizations: Not on file    Relationship status: Not on file  . Intimate partner violence    Fear of current or ex partner: Not on file    Emotionally abused: Not on file    Physically abused: Not on file    Forced sexual activity: Not on file  Other Topics Concern  . Not on file  Social History Narrative   Lives home with wife.  workd at [employer]  Education: Masters.  Children 2.  Caffeine 2 cups daily      PHYSICAL EXAM  Vitals:   06/30/19 0823  BP: 137/88  Pulse: 71  Temp: (!) 97.4 F (36.3 C)  Weight: 184 lb 3.2 oz (83.6 kg)  Height: 5\' 10"  (1.778 m)   Body mass index is 26.43 kg/m.  Generalized: Well developed, in no acute distress  Cardiology: normal rate and rhythm, no murmur noted Neurological examination  Mentation: Alert oriented to time, place, history taking. Follows all commands speech and language fluent Cranial nerve II-XII: Pupils were equal round reactive to light. Extraocular movements were full, visual field were full on confrontational test. Facial sensation and strength were normal. Uvula tongue midline. Head turning and shoulder shrug  were normal and symmetric. Motor: The motor testing reveals 5 over 5 strength of all 4 extremities. Good symmetric motor tone is noted throughout.  Sensory: Sensory testing is intact to soft touch on all 4  extremities. No evidence of extinction is noted. Pinprick testing intact bilaterally.  Gait and station: Gait is normal.   DIAGNOSTIC DATA (LABS, IMAGING, TESTING) - I reviewed patient records, labs, notes, testing and imaging myself where available.  No flowsheet data found.   Lab Results  Component Value Date   WBC 4.6 03/25/2019   HGB 15.9 03/25/2019  HCT 45.9 03/25/2019   MCV 97.5 03/25/2019   PLT 177 03/25/2019      Component Value Date/Time   NA 140 03/25/2019 1152   K 4.3 03/25/2019 1152   CL 104 03/25/2019 1152   CO2 28 03/25/2019 1152   GLUCOSE 106 (H) 03/25/2019 1152   BUN 15 03/25/2019 1152   CREATININE 1.20 03/25/2019 1152   CALCIUM 10.1 03/25/2019 1152   PROT 6.0 (L) 03/25/2019 1152   AST 19 03/25/2019 1152   ALT 41 03/25/2019 1152   BILITOT 1.3 (H) 03/25/2019 1152   GFRNONAA 65 03/25/2019 1152   GFRAA 76 03/25/2019 1152   Lab Results  Component Value Date   CHOL 197 03/25/2019   HDL 39 (L) 03/25/2019   LDLCALC 121 (H) 03/25/2019   TRIG 241 (H) 03/25/2019   CHOLHDL 5.1 (H) 03/25/2019   No results found for: HGBA1C Lab Results  Component Value Date   VITAMINB12 1,103 09/04/2018   Lab Results  Component Value Date   TSH 1.03 03/17/2018       ASSESSMENT AND PLAN 58 y.o. year old male  has a past medical history of Hypertension and RA (rheumatoid arthritis) ([place]). here with     ICD-10-CM   1. Polyneuropathy  G62.9   2. Rheumatoid arthritis of multiple sites with negative rheumatoid factor (HCC)  M06.09   3. Involuntary jerky movements  R25.8     Mr [place] is doing well overall.  He feels that neuropathic pain is stable from last visit but has noted more jerking/twitching movements of his legs, is at night.  Cymbalta does seem to help some with mood.  We will continue Cymbalta 60 mg daily.  I will start gabapentin 100 mg 3 times daily.  I am hopeful that this may aid in neuropathic pain as well as concerns for restless legs.  We will start with  100 mg capsule at night and titrate up as tolerated.  May increase dosage further if well tolerated.  Patient will follow up with me in 6 months, sooner if needed.  He verbalizes understanding and agreement with this plan.   No orders of the defined types were placed in this encounter.    Meds ordered this encounter  Medications  . gabapentin (NEURONTIN) 100 MG capsule    Sig: Take 1 capsule (100 mg total) by mouth 3 (three) times daily. Start 1 capsule at bedtime for 4 days, then increase to 1 capsule twice daily for 4 days, them take 1 capsule three times daily.    Dispense:  90 capsule    Refill:  11    Order Specific Question:   Supervising Provider    Answer:   Melvenia Beam [id number], FNP-C 06/30/2019, 9:04 AM [facility] [address], [place], [place] [address] [phone]

## 2019-07-02 ENCOUNTER — Other Ambulatory Visit: Payer: Self-pay | Admitting: Rheumatology

## 2019-07-05 NOTE — Telephone Encounter (Signed)
Last Visit: 06/03/19  Next Visit: 07/08/19 Labs: 03/25/19 stable  Patient to update labs at appointment on 07/08/19.   Okay to refill per Dr. Estanislado Pandy

## 2019-07-08 ENCOUNTER — Encounter: Payer: Self-pay | Admitting: Physician Assistant

## 2019-07-08 ENCOUNTER — Ambulatory Visit: Payer: BC Managed Care – PPO | Admitting: Rheumatology

## 2019-07-08 VITALS — BP 140/86 | HR 76 | Resp 15 | Ht 70.0 in | Wt 185.0 lb

## 2019-07-08 DIAGNOSIS — M25411 Effusion, right shoulder: Secondary | ICD-10-CM

## 2019-07-08 DIAGNOSIS — Z79899 Other long term (current) drug therapy: Secondary | ICD-10-CM

## 2019-07-08 DIAGNOSIS — I10 Essential (primary) hypertension: Secondary | ICD-10-CM

## 2019-07-08 DIAGNOSIS — M0609 Rheumatoid arthritis without rheumatoid factor, multiple sites: Secondary | ICD-10-CM

## 2019-07-08 DIAGNOSIS — R5383 Other fatigue: Secondary | ICD-10-CM

## 2019-07-08 DIAGNOSIS — M5030 Other cervical disc degeneration, unspecified cervical region: Secondary | ICD-10-CM

## 2019-07-08 DIAGNOSIS — M25462 Effusion, left knee: Secondary | ICD-10-CM | POA: Diagnosis not present

## 2019-07-08 DIAGNOSIS — R200 Anesthesia of skin: Secondary | ICD-10-CM

## 2019-07-08 DIAGNOSIS — B360 Pityriasis versicolor: Secondary | ICD-10-CM

## 2019-07-08 DIAGNOSIS — R202 Paresthesia of skin: Secondary | ICD-10-CM

## 2019-07-09 LAB — COMPLETE METABOLIC PANEL WITH GFR
AG Ratio: 2.9 (calc) — ABNORMAL HIGH (ref 1.0–2.5)
ALT: 40 U/L (ref 9–46)
AST: 19 U/L (ref 10–35)
Albumin: 4.6 g/dL (ref 3.6–5.1)
Alkaline phosphatase (APISO): 42 U/L (ref 35–144)
BUN: 19 mg/dL (ref 7–25)
CO2: 27 mmol/L (ref 20–32)
Calcium: 10 mg/dL (ref 8.6–10.3)
Chloride: 104 mmol/L (ref 98–110)
Creat: 1.23 mg/dL (ref 0.70–1.25)
GFR, Est African American: 73 mL/min/{1.73_m2} (ref >=60)
GFR, Est Non African American: 63 mL/min/{1.73_m2} (ref >=60)
Globulin: 1.6 g/dL (calc) — ABNORMAL LOW (ref 1.9–3.7)
Glucose, Bld: 96 mg/dL (ref 65–99)
Potassium: 4.6 mmol/L (ref 3.5–5.3)
Sodium: 140 mmol/L (ref 135–146)
Total Bilirubin: 1 mg/dL (ref 0.2–1.2)
Total Protein: 6.2 g/dL (ref 6.1–8.1)

## 2019-07-09 LAB — CBC WITH DIFFERENTIAL/PLATELET
Absolute Monocytes: 499 cells/uL (ref 200–950)
Basophils Absolute: 70 cells/uL (ref 0–200)
Basophils Relative: 1.1 %
Eosinophils Absolute: 147 cells/uL (ref 15–500)
Eosinophils Relative: 2.3 %
HCT: 44.6 % (ref 38.5–50.0)
Hemoglobin: 15.7 g/dL (ref 13.2–17.1)
Lymphs Abs: 2726 cells/uL (ref 850–3900)
MCH: 35.4 pg — ABNORMAL HIGH (ref 27.0–33.0)
MCHC: 35.2 g/dL (ref 32.0–36.0)
MCV: 100.7 fL — ABNORMAL HIGH (ref 80.0–100.0)
MPV: 10.4 fL (ref 7.5–12.5)
Monocytes Relative: 7.8 %
Neutro Abs: 2957 cells/uL (ref 1500–7800)
Neutrophils Relative %: 46.2 %
Platelets: 252 10*3/uL (ref 140–400)
RBC: 4.43 10*6/uL (ref 4.20–5.80)
RDW: 13.5 % (ref 11.0–15.0)
Total Lymphocyte: 42.6 %
WBC: 6.4 10*3/uL (ref 3.8–10.8)

## 2019-07-09 NOTE — Progress Notes (Signed)
I reviewed note and agree with plan.   Penni Bombard, MD [id number], 0000000 AM Certified in Neurology, Neurophysiology and Neuroimaging  [facility] [address] [place], [place] [address] [phone]

## 2019-07-09 NOTE — Progress Notes (Signed)
CMP stable.  MCH and MCV are mildly elevated.  Please ensure the patient is taking folic acid 2 mg po daily.  We will continue to monitor.

## 2019-07-11 ENCOUNTER — Encounter: Payer: Self-pay | Admitting: Rheumatology

## 2019-07-14 ENCOUNTER — Encounter: Payer: Self-pay | Admitting: Rheumatology

## 2019-07-25 ENCOUNTER — Encounter: Payer: Self-pay | Admitting: Rheumatology

## 2019-07-26 ENCOUNTER — Encounter: Payer: Self-pay | Admitting: Rheumatology

## 2019-07-26 NOTE — Telephone Encounter (Signed)
Please advise patient to send pictures of both feet, which have been swelling.   Please schedule virtual visit with this patient tomorrow or one day this week to discuss how to proceed with MTX.

## 2019-07-26 NOTE — Progress Notes (Signed)
Virtual Visit via Video Note  I connected with Benjamin Fields on 07/27/19 at  9:30 AM EST by a video enabled telemedicine application and verified that I am speaking with the correct person using two identifiers.  Location: Patient: Home Provider: Clinic This service was conducted via virtual visit.  Patient was unable to get the video to work due to Internet issues, so we talked on the phone. The patient was located at home. I was located in my office.  Consent was obtained prior to the virtual visit and is aware of possible charges through their insurance for this visit.  The patient is an established patient.  Dr. Estanislado Pandy, MD conducted the virtual visit and Hazel Sams, PA-C acted as scribe during the service.  Office staff helped with scheduling follow up visits after the service was conducted.   I discussed the limitations of evaluation and management by telemedicine and the availability of in person appointments. The patient expressed understanding and agreed to proceed.  CC: Discuss medications  History of Present Illness: Patient is a 58 year old male with a past medical history of seronegative rheumatoid arthritis.  He is taking Rinvoq 15 mg 1 tablet daily and Otrexup 20 mg sq injections once weekly and folic acid 2 mg po daily.  He states he continues to have persistent side effects of otrexup.  He states he experiences increased eye redness, itching, and irritation after his injections on the weekends.  He states his symptoms resolve during the week.  He has seen his ophthalmologist and uses eye drops.   He is having some discomfort in both feet, exacerbated by walking prolonged distances. He is not having any joint swelling.  He has no other joint pain or joint swelling at this time.   Review of Systems  Constitutional: Negative for fever and malaise/fatigue.  Eyes: Positive for redness. Negative for photophobia, pain and discharge.  Respiratory: Negative for cough, shortness of  breath and wheezing.   Cardiovascular: Negative for chest pain and palpitations.  Gastrointestinal: Negative for blood in stool, constipation and diarrhea.  Genitourinary: Negative for dysuria.  Musculoskeletal: Positive for joint pain. Negative for back pain, myalgias and neck pain.  Skin: Negative for rash.  Neurological: Negative for dizziness and headaches.  Psychiatric/Behavioral: Negative for depression. The patient is not nervous/anxious and does not have insomnia.       Observations/Objective: Physical Exam  Constitutional: He is oriented to person, place, and time and well-developed, well-nourished, and in no distress.  HENT:  Head: Normocephalic and atraumatic.  Eyes: Conjunctivae are normal.  Pulmonary/Chest: Effort normal.  Neurological: He is alert and oriented to person, place, and time.  Psychiatric: Mood, memory, affect and judgment normal.    Patient reports morning stiffness for 1 hour.   Patient reports nocturnal pain.  Difficulty dressing/grooming: Denies Difficulty climbing stairs: Denies Difficulty getting out of chair: Denies Difficulty using hands for taps, buttons, cutlery, and/or writing: Denies  Assessment and Plan: Visit Diagnoses: Rheumatoid arthritis of multiple sites with negative rheumatoid factor ([place]): He is experiencing intermittent pain in both feet, which is exacerbated by walking prolonged distances.  No obvious joint swelling was visualized when examining the pictures posted on MyChart.  Discussed the importance of wearing proper fitting shoes.   We will obtain sed rate and uric acid and notify him with these results. We can obtain x-rays of both feet at his follow up visit if he discomfort persists or worsens. He is not having any other joint pain  or joint swelling.  He is clinically doing well on Rinvoq 15 mg 1 tablet by mouth daily, Otrexup 20 mg sq injections once weekly, and folic acid 2 mg by mouth daily.  He has also noticed improvement in  starting on Cymbalta 60 mg 1 capsule daily.  He will follow up in 3 months.   High risk medication use - Rinvoq 15 mg 1 tablet daily started in October 2020, Otrexup 20 mg every 7 days, and folic acid 1 mg 2 tablets daily.  Last TB gold negative on 03/25/2019 and will monitor yearly.  Last lipid panel  Showed elevated LDL on 03/25/2019 and will monitor yearly. CBC and CMP stable on 07/08/19. He will return for lab work in February and every 3 months. (inadequate response to Enbrel and Humira).  He was reminded to hold Otrexup and Rinvoq if he develops any signs or symptoms of an infection and to resume once the infection has completely cleared.  Effusion of right shoulder: Resolved.  He has no discomfort at this time.   Effusion of left knee: Resolved.  He has no discomfort or joint swelling at this time.   DDD (degenerative disc disease), cervical: He has no discomfort at this time. No symptoms of radiculopathy at this time.   Pain in both feet: He has been having increased pain in both feet.  He notices intermittent swelling, which is exacerbated by walking prolonged distances.  He sent pictures via MyChart, which did not reveal obvious joint swelling.  We can obtain x-rays at his follow up visit if his discomfort persists.  We discussed the importance of wearing proper fitting shoes.  We will also check a sed rate and uric acid.  Orders will be placed today.    Other medical conditions are listed as follows:   Numbness and tingling of both feet  Tinea versicolor  Other fatigue  Essential hypertension  Follow Up Instructions: He will follow up in    I discussed the assessment and treatment plan with the patient. The patient was provided an opportunity to ask questions and all were answered. The patient agreed with the plan and demonstrated an understanding of the instructions.   The patient was advised to call back or seek an in-person evaluation if the symptoms worsen or if the  condition fails to improve as anticipated.  I provided 25 minutes of non-face-to-face time during this encounter.   Bo Merino, MD   Scribed by-  Hazel Sams, PA-C

## 2019-07-27 ENCOUNTER — Encounter: Payer: Self-pay | Admitting: Rheumatology

## 2019-07-27 ENCOUNTER — Telehealth (INDEPENDENT_AMBULATORY_CARE_PROVIDER_SITE_OTHER): Payer: BC Managed Care – PPO | Admitting: Rheumatology

## 2019-07-27 ENCOUNTER — Other Ambulatory Visit: Payer: Self-pay

## 2019-07-27 DIAGNOSIS — R5383 Other fatigue: Secondary | ICD-10-CM

## 2019-07-27 DIAGNOSIS — M79672 Pain in left foot: Secondary | ICD-10-CM

## 2019-07-27 DIAGNOSIS — M79671 Pain in right foot: Secondary | ICD-10-CM

## 2019-07-27 DIAGNOSIS — R200 Anesthesia of skin: Secondary | ICD-10-CM

## 2019-07-27 DIAGNOSIS — M0609 Rheumatoid arthritis without rheumatoid factor, multiple sites: Secondary | ICD-10-CM | POA: Diagnosis not present

## 2019-07-27 DIAGNOSIS — M5030 Other cervical disc degeneration, unspecified cervical region: Secondary | ICD-10-CM

## 2019-07-27 DIAGNOSIS — M25462 Effusion, left knee: Secondary | ICD-10-CM

## 2019-07-27 DIAGNOSIS — M25411 Effusion, right shoulder: Secondary | ICD-10-CM

## 2019-07-27 DIAGNOSIS — R202 Paresthesia of skin: Secondary | ICD-10-CM

## 2019-07-27 DIAGNOSIS — Z79899 Other long term (current) drug therapy: Secondary | ICD-10-CM | POA: Diagnosis not present

## 2019-07-27 DIAGNOSIS — B360 Pityriasis versicolor: Secondary | ICD-10-CM

## 2019-07-27 DIAGNOSIS — I10 Essential (primary) hypertension: Secondary | ICD-10-CM

## 2019-07-28 DIAGNOSIS — M79672 Pain in left foot: Secondary | ICD-10-CM | POA: Diagnosis not present

## 2019-07-28 DIAGNOSIS — M79671 Pain in right foot: Secondary | ICD-10-CM | POA: Diagnosis not present

## 2019-08-02 LAB — CYCLIC CITRUL PEPTIDE ANTIBODY, IGG: Cyclic Citrullin Peptide Ab: <16 UNITS

## 2019-08-02 LAB — SEDIMENTATION RATE: Sed Rate: 2 mm/h (ref 0–20)

## 2019-08-02 LAB — RHEUMATOID FACTOR: Rheumatoid fact SerPl-aCnc: <14 IU/mL (ref <14)

## 2019-08-02 LAB — 14-3-3 ETA PROTEIN: 14-3-3 eta Protein: <0.2 ng/mL (ref <0.2)

## 2019-08-02 LAB — URIC ACID: Uric Acid, Serum: 5.5 mg/dL (ref 4.0–8.0)

## 2019-08-03 ENCOUNTER — Telehealth: Payer: Self-pay | Admitting: Rheumatology

## 2019-08-03 NOTE — Progress Notes (Signed)
Please notify patient that his sed rate is normal which indicates no inflammation.  All autoimmune labs were negative.

## 2019-08-03 NOTE — Telephone Encounter (Signed)
Spoke with patient about lab result at 11:35am. See lab note.

## 2019-08-03 NOTE — Telephone Encounter (Signed)
Patient left a message this am requesting a call back to get results from the [employer] that had not resulted the last time he spoke with someone.

## 2019-08-05 ENCOUNTER — Other Ambulatory Visit: Payer: Self-pay | Admitting: Rheumatology

## 2019-08-05 NOTE — Telephone Encounter (Signed)
Last Visit: 07/27/2019 telemedicine  Next Visit: 10/27/2019 Labs: 07/08/2019 CMP stable. MCH and MCV are mildly elevated.  Okay to refill per Dr. Estanislado Pandy.

## 2019-08-08 ENCOUNTER — Other Ambulatory Visit: Payer: Self-pay | Admitting: Rheumatology

## 2019-08-09 NOTE — Telephone Encounter (Signed)
Last Visit: 07/27/2019 telemedicine  Next Visit: 10/27/2019  Okay to refill per Dr. Estanislado Pandy.

## 2019-08-10 ENCOUNTER — Encounter: Payer: Self-pay | Admitting: Family Medicine

## 2019-08-10 ENCOUNTER — Encounter: Payer: Self-pay | Admitting: Rheumatology

## 2019-08-17 ENCOUNTER — Encounter: Payer: Self-pay | Admitting: Rheumatology

## 2019-08-18 NOTE — Telephone Encounter (Signed)
Spoke with patient and patient states the symptoms are improving and he declined prednisone at this time.

## 2019-08-18 NOTE — Telephone Encounter (Signed)
Attempted to contact patient and left message on machine for patient to call the office.  

## 2019-08-29 ENCOUNTER — Encounter: Payer: Self-pay | Admitting: Rheumatology

## 2019-08-30 DIAGNOSIS — Z20828 Contact with and (suspected) exposure to other viral communicable diseases: Secondary | ICD-10-CM | POA: Diagnosis not present

## 2019-08-30 NOTE — Telephone Encounter (Signed)
Please schedule an in office visit.

## 2019-09-01 ENCOUNTER — Encounter: Payer: Self-pay | Admitting: Rheumatology

## 2019-09-02 NOTE — Telephone Encounter (Signed)
Please schedule an in office appointment for the patient for next week.

## 2019-09-06 NOTE — Progress Notes (Signed)
Office Visit Note  Patient: Benjamin Fields             Date of Birth: 1960-11-01           MRN: [id number]             PCP: Lennie Odor, PA-C Referring: Lennie Odor, PA-C Visit Date: 09/09/2019 Occupation: '@GUAROCC'$ @  Subjective:  Difficulty swallowing   History of Present Illness: Benjamin Fields is a 59 y.o. male with history of seronegative rheumatoid arthritis and DDD.  He is taking Rinvoq 15 mg 1 tablet daily, Otrexup 20 mg sq injections once weekly, and folic acid 2 mg po daily.  He is also taking cymbalta 60 mg 1 capsule daily.  He presents today with neck swelling and difficulty swallowing, which started several weeks ago.  He denies a sore throat or difficulty breathing. He has been experiencing increased fatigue and muscle soreness in the evenings. He continues to have discomfort in both shoulder joints with ROM.  He is concerned about new nodules on his feet. He denies any joint swelling at this time.  He has been walking for exercise.    Activities of Daily Living:  Patient reports morning stiffness for 30 minutes.   Patient Reports nocturnal pain.  Difficulty dressing/grooming: Denies Difficulty climbing stairs: Denies Difficulty getting out of chair: Denies Difficulty using hands for taps, buttons, cutlery, and/or writing: Denies  Review of Systems  Constitutional: Positive for fatigue. Negative for night sweats.  HENT: Positive for trouble swallowing and trouble swallowing. Negative for mouth sores, sore throat, mouth dryness and nose dryness.   Eyes: Positive for dryness. Negative for redness.  Respiratory: Negative for shortness of breath, wheezing and difficulty breathing.   Cardiovascular: Negative for chest pain, palpitations, hypertension, irregular heartbeat and swelling in legs/feet.  Gastrointestinal: Positive for constipation. Negative for blood in stool and diarrhea.  Endocrine: Negative for increased urination.  Genitourinary: Negative for  difficulty urinating and painful urination.  Musculoskeletal: Positive for arthralgias, joint pain, joint swelling and morning stiffness. Negative for myalgias, muscle weakness, muscle tenderness and myalgias.  Skin: Negative for color change, rash, hair loss, nodules/bumps, skin tightness, ulcers and sensitivity to sunlight.  Allergic/Immunologic: Negative for susceptible to infections.  Neurological: Positive for numbness and headaches. Negative for dizziness, fainting, memory loss and night sweats.  Hematological: Negative for swollen glands.  Psychiatric/Behavioral: Negative for depressed mood, confusion and sleep disturbance. The patient is not nervous/anxious.     PMFS History:  Patient Active Problem List   Diagnosis Date Noted  . Rheumatoid arthritis of multiple sites with negative rheumatoid factor ([place]) 05/19/2018  . Tinea versicolor 05/19/2018  . Essential hypertension 03/17/2018  . DDD (degenerative disc disease), cervical 03/16/2018    Past Medical History:  Diagnosis Date  . Hypertension   . RA (rheumatoid arthritis) (H[facility])     Family History  Problem Relation Age of Onset  . Parkinson's disease Mother   . Prostate cancer Mother   . Atrial fibrillation Mother   . Heart failure Father   . Hypertension Father    Past Surgical History:  Procedure Laterality Date  . CERVICAL SPINE SURGERY  2010  . TONSILLECTOMY     Social History   Social History Narrative   Lives home with wife.  workd at [employer]  Education: Masters.  Children 2.  Caffeine 2 cups daily   Immunization History  Administered Date(s) Administered  . Influenza,inj,Quad PF,6+ Mos 07/06/2016     Objective: Vital Signs: BP  139/87 (BP Location: Left Arm, Patient Position: Sitting, Cuff Size: Normal)   Pulse 70   Resp 13   Ht '5\' 10"'$  (1.778 m)   Wt 189 lb (85.7 kg)   BMI 27.12 kg/m    Physical Exam Vitals and nursing note reviewed.  Constitutional:      Appearance: He is well-developed.    HENT:     Head: Normocephalic and atraumatic.  Eyes:     Conjunctiva/sclera: Conjunctivae normal.     Pupils: Pupils are equal, round, and reactive to light.  Cardiovascular:     Rate and Rhythm: Normal rate and regular rhythm.     Heart sounds: Normal heart sounds.  Pulmonary:     Effort: Pulmonary effort is normal.     Breath sounds: Normal breath sounds.  Abdominal:     General: Bowel sounds are normal.     Palpations: Abdomen is soft.  Musculoskeletal:     Cervical back: Normal range of motion and neck supple.  Skin:    General: Skin is warm and dry.     Capillary Refill: Capillary refill takes less than 2 seconds.  Neurological:     Mental Status: He is alert and oriented to person, place, and time.  Psychiatric:        Behavior: Behavior normal.      Musculoskeletal Exam: C-spine, thoracic spine, and lumbar spine good ROM. No Sternoclavicular joint thickening or tenderness noted.  Trachea midline.  Shoulder joints good ROM with mild discomfort.  Elbow joints, wrist joints, MCPs, PIPs, and DIPs good ROM with no synovitis.  Complete fist formation bilaterally.  Hip joints, knee joints, ankle joints, MTPs, PIPs, and DIPs good ROM with no synovitis.  No warmth or effusion of knee joints noted.  No tenderness or inflammation of ankle joints.   CDAI Exam: CDAI Score: 0.2  Patient Global: 1 mm; Provider Global: 1 mm Swollen: 0 ; Tender: 0  Joint Exam 09/09/2019   No joint exam has been documented for this visit   There is currently no information documented on the homunculus. Go to the Rheumatology activity and complete the homunculus joint exam.  Investigation: No additional findings.  Imaging: No results found.  Recent Labs: Lab Results  Component Value Date   WBC 6.4 07/08/2019   HGB 15.7 07/08/2019   PLT 252 07/08/2019   NA 140 07/08/2019   K 4.6 07/08/2019   CL 104 07/08/2019   CO2 27 07/08/2019   GLUCOSE 96 07/08/2019   BUN 19 07/08/2019   CREATININE  1.23 07/08/2019   BILITOT 1.0 07/08/2019   AST 19 07/08/2019   ALT 40 07/08/2019   PROT 6.2 07/08/2019   CALCIUM 10.0 07/08/2019   GFRAA 73 07/08/2019   QFTBGOLDPLUS NEGATIVE 03/25/2019    Speciality Comments: Prior therapy: Enbrel and Humira (inadequate reponse)  Procedures:  No procedures performed Allergies: Cortisone     Assessment / Plan:     Visit Diagnoses: Rheumatoid arthritis of multiple sites with negative rheumatoid factor ([place]): RF-, anti-CCP-, 14-3-3 eta-, ESR 2: He has no synovitis on exam.  He has good ROM of both shoulder joints with mild discomfort bilaterally.  No tenderness or effusion of the right shoulder joint or left knee joint was noted.  He has no tenderness of the MCP or MTP joints on exam.  A couple weeks ago he was experiencing some diffuse swelling in the sternoclavicular region, but he has no tenderness, synovitis, or synovial thickening of the sternoclavicular joints on exam today.  He is having no difficulty breathing at this time.  No enlarged lymph nodes were noted.  No tracheal deviation noted.  He has not had any recent rheumatoid arthritis flares.  He is clinically doing well on Rinvoq 15 mg 1 tablet by mouth daily, Otrexup 20 mg sq injections once weekly, and folic acid 2 mg po daily.  He will continue on the current treatment regimen.  He was advised to notify us if he develops increased joint pain or joint swelling.  He will follow up in  5 months.   High risk medication use - Rinvoq 15 mg 1 tablet daily started in October 2020, Otrexup 20 mg every 7 days. (inadequate response to Enbrel, Humira, Actemra) CBC and CMP were drawn on 07/08/19.  He is due to update lab work in February and every 3 months.  Standing orders are in place.   Effusion of right shoulder: Resolved.  He has good ROM with mild discomfort.  No tenderness or joint effusion noted.    Effusion of left knee: Resolved.  He has good ROM with no discomfort.  No warmth or effusion noted.    DDD (degenerative disc disease), cervical: He has good ROM with no discomfort.  He has no symptoms of radiculopathy at this time.   Numbness and tingling of both feet: Improved with taking Gabapentin.    Tinea versicolor: Resolved   Other fatigue: He has been experiencing increased fatigue in the evening.  He remains every active and often "overdoes it."  We discussed the importance of regular exercise and good sleep hygiene.    Essential hypertension  Orders: No orders of the defined types were placed in this encounter.  No orders of the defined types were placed in this encounter.   Face-to-face time spent with patient was 30 minutes. Greater than 50% of time was spent in counseling and coordination of care.  Follow-Up Instructions: Return in 3 months (on 12/08/2019) for Rheumatoid arthritis, DDD.   Bo Merino, MD   Scribed byLovena Le Dale,PA-C  Note - This record has been created using Dragon software.  Chart creation errors have been sought, but may not always  have been located. Such creation errors do not reflect on  the standard of medical care.

## 2019-09-09 ENCOUNTER — Other Ambulatory Visit: Payer: Self-pay

## 2019-09-09 ENCOUNTER — Ambulatory Visit: Payer: BC Managed Care – PPO | Admitting: Rheumatology

## 2019-09-09 ENCOUNTER — Encounter: Payer: Self-pay | Admitting: Rheumatology

## 2019-09-09 VITALS — BP 139/87 | HR 70 | Resp 13 | Ht 70.0 in | Wt 189.0 lb

## 2019-09-09 DIAGNOSIS — M0609 Rheumatoid arthritis without rheumatoid factor, multiple sites: Secondary | ICD-10-CM | POA: Diagnosis not present

## 2019-09-09 DIAGNOSIS — M5030 Other cervical disc degeneration, unspecified cervical region: Secondary | ICD-10-CM

## 2019-09-09 DIAGNOSIS — Z79899 Other long term (current) drug therapy: Secondary | ICD-10-CM

## 2019-09-09 DIAGNOSIS — M25411 Effusion, right shoulder: Secondary | ICD-10-CM

## 2019-09-09 DIAGNOSIS — R200 Anesthesia of skin: Secondary | ICD-10-CM

## 2019-09-09 DIAGNOSIS — M25462 Effusion, left knee: Secondary | ICD-10-CM

## 2019-09-09 DIAGNOSIS — I10 Essential (primary) hypertension: Secondary | ICD-10-CM

## 2019-09-09 DIAGNOSIS — B360 Pityriasis versicolor: Secondary | ICD-10-CM

## 2019-09-09 DIAGNOSIS — R202 Paresthesia of skin: Secondary | ICD-10-CM

## 2019-09-09 DIAGNOSIS — R5383 Other fatigue: Secondary | ICD-10-CM

## 2019-09-09 NOTE — Patient Instructions (Signed)
Standing Labs We placed an order today for your standing lab work.    Please come back and get your standing labs in February and every 3 months  We have open lab daily Monday through Thursday from 8:30-12:30 PM and 1:30-4:30 PM and Friday from 8:30-12:30 PM and 1:30-4:00 PM at the office of Dr. Shaili Deveshwar.   You may experience shorter wait times on Monday and Friday afternoons. The office is located at [address], [address], [place], [place] [address] No appointment is necessary.   Labs are drawn by [facility].  You may receive a bill from [facility] for your lab work.  If you wish to have your labs drawn at another location, please call the office 24 hours in advance to send orders.  If you have any questions regarding directions or hours of operation,  please call [phone].   Just as a reminder please drink plenty of water prior to coming for your lab work. Thanks!  

## 2019-09-10 ENCOUNTER — Other Ambulatory Visit: Payer: Self-pay | Admitting: Rheumatology

## 2019-09-10 DIAGNOSIS — M0609 Rheumatoid arthritis without rheumatoid factor, multiple sites: Secondary | ICD-10-CM

## 2019-09-11 ENCOUNTER — Other Ambulatory Visit: Payer: Self-pay | Admitting: Rheumatology

## 2019-09-13 ENCOUNTER — Other Ambulatory Visit: Payer: Self-pay | Admitting: Rheumatology

## 2019-09-13 NOTE — Telephone Encounter (Signed)
Last Visit: 09/09/19 Next Visit: 02/09/20 Labs: 07/08/19 CMP stable. MCH and MCV are mildly elevated.  Okay to refill per Dr. Estanislado Pandy

## 2019-09-13 NOTE — Telephone Encounter (Signed)
Last Visit: 09/09/19 Next Visit: 02/09/20  Okay to refill per Dr. Estanislado Pandy

## 2019-09-20 ENCOUNTER — Encounter: Payer: Self-pay | Admitting: Family Medicine

## 2019-09-21 ENCOUNTER — Encounter: Payer: Self-pay | Admitting: Rheumatology

## 2019-09-21 DIAGNOSIS — G8929 Other chronic pain: Secondary | ICD-10-CM

## 2019-09-21 DIAGNOSIS — M545 Low back pain, unspecified: Secondary | ICD-10-CM

## 2019-09-21 NOTE — Telephone Encounter (Signed)
Please refer patient urgently to Dr. Louanne Skye for further evaluation and treatment.

## 2019-09-23 ENCOUNTER — Other Ambulatory Visit: Payer: Self-pay

## 2019-09-23 ENCOUNTER — Ambulatory Visit
Admission: EM | Admit: 2019-09-23 | Discharge: 2019-09-23 | Disposition: A | Discharge status: Home / Self Care | Payer: BC Managed Care – PPO | Attending: Physician Assistant | Admitting: Physician Assistant

## 2019-09-23 ENCOUNTER — Ambulatory Visit: Payer: BC Managed Care – PPO | Admitting: Specialist

## 2019-09-23 ENCOUNTER — Telehealth: Payer: Self-pay | Admitting: *Deleted

## 2019-09-23 DIAGNOSIS — R05 Cough: Secondary | ICD-10-CM

## 2019-09-23 DIAGNOSIS — R059 Cough, unspecified: Secondary | ICD-10-CM

## 2019-09-23 DIAGNOSIS — J3489 Other specified disorders of nose and nasal sinuses: Secondary | ICD-10-CM

## 2019-09-23 DIAGNOSIS — Z20822 Contact with and (suspected) exposure to covid-19: Secondary | ICD-10-CM

## 2019-09-23 LAB — POCT INFLUENZA A/B
Influenza A, POC: NEGATIVE
Influenza B, POC: NEGATIVE

## 2019-09-23 LAB — POC SARS CORONAVIRUS 2 AG -  ED: SARS Coronavirus 2 Ag: NEGATIVE

## 2019-09-23 MED ORDER — BENZONATATE 100 MG PO CAPS: 100.0000 mg | ORAL_CAPSULE | Freq: Three times a day (TID) | ORAL | 0 refills | Status: DC

## 2019-09-23 MED ORDER — FLUTICASONE PROPIONATE 50 MCG/ACT NA SUSP: 2.0000 | Freq: Every day | NASAL | 0 refills | Status: DC

## 2019-09-23 NOTE — Telephone Encounter (Signed)
Please advise patient to follow up with PCP for further evaluation and treatment. He may need to be retested for covid and influenza. Please advise patient to hold Otrexup and Rinvoq until his symptoms have completely resolved.

## 2019-09-23 NOTE — Telephone Encounter (Signed)
Patient advised  to follow up with PCP for further evaluation and treatment. Patient advised he may need to be retested for covid and influenza. Patient advised to hold Otrexup and Rinvoq until his symptoms have completely resolved. Patient verbalized understanding.

## 2019-09-23 NOTE — ED Provider Notes (Signed)
[facility]    CSN: [id number] Arrival date & time: 09/23/19  1511      History   Chief Complaint Chief Complaint  Patient presents with  . Cough    HPI Benjamin Fields is a 59 y.o. male.   59 year old male with history of HTN, RA otrexup and rinvoq on comes in for 3 day of URI symptoms. Has had rhinorrhea, cough, headache, throat irritation. Denies fever, chills, body aches. Denies abdominal pain, nausea, vomiting, diarrhea. Denies shortness of breath, loss of taste/smell. Has not taken anything for the symptoms.   Patient contacted rheumatologist, and was told to discontinue otrexup and rinvoq for now.      Past Medical History:  Diagnosis Date  . Hypertension   . RA (rheumatoid arthritis) [facility])     Patient Active Problem List   Diagnosis Date Noted  . Rheumatoid arthritis of multiple sites with negative rheumatoid factor ([place]) 05/19/2018  . Tinea versicolor 05/19/2018  . Essential hypertension 03/17/2018  . DDD (degenerative disc disease), cervical 03/16/2018    Past Surgical History:  Procedure Laterality Date  . CERVICAL SPINE SURGERY  2010  . TONSILLECTOMY         Home Medications    Prior to Admission medications   Medication Sig Start Date End Date Taking? Authorizing Provider  benzonatate (TESSALON) 100 MG capsule Take 1-2 capsules (100-200 mg total) by mouth every 8 (eight) hours. 09/23/19   Ok Edwards, PA-C  DULoxetine (CYMBALTA) 60 MG capsule Take 1 capsule by mouth once daily 09/13/19   Bo Merino, MD  fluticasone (FLONASE) 50 MCG/ACT nasal spray Place 2 sprays into both nostrils daily. 09/23/19   Tasia Catchings, Tramaine Snell V, PA-C  folic acid (FOLVITE) 1 MG tablet Take 2 tablets by mouth once daily 09/13/19   Bo Merino, MD  gabapentin (NEURONTIN) 100 MG capsule Take 1 capsule (100 mg total) by mouth 3 (three) times daily. Start 1 capsule at bedtime for 4 days, then increase to 1 capsule twice daily for 4 days, them take 1 capsule three times  daily. 06/30/19   Lomax, Braelyn Jenson, NP  losartan (COZAAR) 25 MG tablet Take 25 mg by mouth daily. for high blood pressure 03/11/18   [provider]  Multiple Vitamin (MULTIVITAMIN) tablet Take 1 tablet by mouth daily. OTC    [provider]  OTREXUP 20 MG/0.4ML SOAJ INJECT 20 MG INTO THE SKIN ONCE A WEEK AS DIRECTED 08/05/19   Deveshwar, Abel Presto, MD  RINVOQ 15 MG TB24 TAKE 1 TABLET (15 MG) BY MOUTH DAILY. 09/13/19   Bo Merino, MD    Family History Family History  Problem Relation Age of Onset  . Parkinson's disease Mother   . Prostate cancer Mother   . Atrial fibrillation Mother   . Heart failure Father   . Hypertension Father     Social History Social History   Tobacco Use  . Smoking status: Never Smoker  . Smokeless tobacco: Never Used  Substance Use Topics  . Alcohol use: Never  . Drug use: Never     Allergies   Cortisone   Review of Systems Review of Systems  Reason unable to perform ROS: See HPI as above.     Physical Exam Triage Vital Signs ED Triage Vitals [09/23/19 1527]  Enc Vitals Group     BP 134/90     Pulse Rate (!) 101     Resp 18     Temp 98.9 F (37.2 C)  Temp Source Oral     SpO2 96 %     Weight      Height      Head Circumference      Peak Flow      Pain Score 0     Pain Loc      Pain Edu?      Excl. in [place]?    No data found.  Updated Vital Signs BP 134/90 (BP Location: Left Arm)   Pulse (!) 101   Temp 98.9 F (37.2 C) (Oral)   Resp 18   SpO2 96%   Physical Exam Constitutional:      General: He is not in acute distress.    Appearance: Normal appearance. He is not ill-appearing, toxic-appearing or diaphoretic.  HENT:     Head: Normocephalic and atraumatic.     Mouth/Throat:     Mouth: Mucous membranes are moist.     Pharynx: Oropharynx is clear. Uvula midline.  Cardiovascular:     Rate and Rhythm: Normal rate and regular rhythm.     Heart sounds: Normal heart sounds. No murmur. No friction rub. No  gallop.   Pulmonary:     Effort: Pulmonary effort is normal. No accessory muscle usage, prolonged expiration, respiratory distress or retractions.     Comments: Lungs clear to auscultation without adventitious lung sounds. Musculoskeletal:     Cervical back: Normal range of motion and neck supple.  Neurological:     General: No focal deficit present.     Mental Status: He is alert and oriented to person, place, and time.      UC Treatments / Results  Labs (all labs ordered are listed, but only abnormal results are displayed) Labs Reviewed  POC SARS CORONAVIRUS 2 AG -  ED - Normal  POCT INFLUENZA A/B - Normal  NOVEL CORONAVIRUS, NAA    EKG   Radiology No results found.  Procedures Procedures (including critical care time)  Medications Ordered in UC Medications - No data to display  Initial Impression / Assessment and Plan / UC Course  I have reviewed the triage vital signs and the nursing notes.  Pertinent labs & imaging results that were available during my care of the patient were reviewed by me and considered in my medical decision making (see chart for details).    Given COVID like symptoms on immunosuppressants, will obtain rapid COVID for evaluation. Patient also requesting flu testing.  Rapid flu, rapid covid negative. PCR COVID sent, patient to quarantine until testing results return. At this time, patient with clear lungs, stable vitals, to continue to monitor with symptomatic treatment. Return precautions given.  *if patient PCR COVID positive, would be candidate for infusion clinic.  Final Clinical Impressions(s) / UC Diagnoses   Final diagnoses:  Cough  Rhinorrhea   ED Prescriptions    Medication Sig Dispense Auth. Provider   benzonatate (TESSALON) 100 MG capsule Take 1-2 capsules (100-200 mg total) by mouth every 8 (eight) hours. 30 capsule Jara Feider V, PA-C   fluticasone (FLONASE) 50 MCG/ACT nasal spray Place 2 sprays into both nostrils daily. 1 g Ok Edwards, PA-C     PDMP not reviewed this encounter.   Ok Edwards, PA-C 09/23/19 1611

## 2019-09-23 NOTE — Telephone Encounter (Signed)
Patient states he has been having cough, sneezing, runny nose and body aches. Patient states it has been going on for a couple of days but got worse last night. Patient denies any fever. Patient states he has not been around anyone positive for Covid. Patient states he was tested for Covid on 08/24/19 and the results were negative. Patient is on Otrexup and Rinvoq. Patient would like to know what he can take for this and how long he should his hold his medication. Please advise.

## 2019-09-23 NOTE — Discharge Instructions (Addendum)
Rapid flu negative. Rapid COVID negative. COVID PCR testing ordered. I would like you to quarantine until testing results. You can take over the counter flonase/nasacort to help with nasal congestion/drainage. Tylenol/motrin for pain and fever. Keep hydrated, urine should be clear to pale yellow in color. If experiencing shortness of breath, trouble breathing, go to the emergency department for further evaluation needed.

## 2019-09-23 NOTE — ED Triage Notes (Signed)
Pt c/o runny nose, cough, headache, and itchy/sore throat since Monday, worse today.

## 2019-09-24 LAB — NOVEL CORONAVIRUS, NAA: SARS-CoV-2, NAA: NOT DETECTED

## 2019-09-25 ENCOUNTER — Encounter: Payer: Self-pay | Admitting: Rheumatology

## 2019-09-28 ENCOUNTER — Encounter: Payer: Self-pay | Admitting: Rheumatology

## 2019-10-03 ENCOUNTER — Encounter: Payer: Self-pay | Admitting: Rheumatology

## 2019-10-08 ENCOUNTER — Other Ambulatory Visit: Payer: Self-pay | Admitting: Rheumatology

## 2019-10-08 NOTE — Telephone Encounter (Signed)
Last Visit:09/09/2019 Next Visit:02/09/2020  Okay to refillper Dr. Deveshwar. 

## 2019-10-15 ENCOUNTER — Ambulatory Visit: Payer: BC Managed Care – PPO | Admitting: Specialist

## 2019-10-22 ENCOUNTER — Other Ambulatory Visit: Payer: Self-pay | Admitting: *Deleted

## 2019-10-22 DIAGNOSIS — Z79899 Other long term (current) drug therapy: Secondary | ICD-10-CM

## 2019-10-22 LAB — COMPLETE METABOLIC PANEL WITH GFR
AG Ratio: 2.9 (calc) — ABNORMAL HIGH (ref 1.0–2.5)
ALT: 40 U/L (ref 9–46)
AST: 21 U/L (ref 10–35)
Albumin: 4.4 g/dL (ref 3.6–5.1)
Alkaline phosphatase (APISO): 53 U/L (ref 35–144)
BUN: 20 mg/dL (ref 7–25)
CO2: 25 mmol/L (ref 20–32)
Calcium: 10 mg/dL (ref 8.6–10.3)
Chloride: 105 mmol/L (ref 98–110)
Creat: 1.21 mg/dL (ref 0.70–1.25)
GFR, Est African American: 75 mL/min/{1.73_m2} (ref >=60)
GFR, Est Non African American: 65 mL/min/{1.73_m2} (ref >=60)
Globulin: 1.5 g/dL (calc) — ABNORMAL LOW (ref 1.9–3.7)
Glucose, Bld: 89 mg/dL (ref 65–99)
Potassium: 4.1 mmol/L (ref 3.5–5.3)
Sodium: 139 mmol/L (ref 135–146)
Total Bilirubin: 0.9 mg/dL (ref 0.2–1.2)
Total Protein: 5.9 g/dL — ABNORMAL LOW (ref 6.1–8.1)

## 2019-10-22 LAB — LIPID PANEL
Cholesterol: 209 mg/dL — ABNORMAL HIGH (ref <200)
HDL: 43 mg/dL (ref >=40)
LDL Cholesterol (Calc): 130 mg/dL (calc) — ABNORMAL HIGH
Non-HDL Cholesterol (Calc): 166 mg/dL (calc) — ABNORMAL HIGH (ref <130)
Total CHOL/HDL Ratio: 4.9 (calc) (ref <5.0)
Triglycerides: 217 mg/dL — ABNORMAL HIGH (ref <150)

## 2019-10-22 LAB — CBC WITH DIFFERENTIAL/PLATELET
Absolute Monocytes: 752 cells/uL (ref 200–950)
Basophils Absolute: 102 cells/uL (ref 0–200)
Basophils Relative: 1.4 %
Eosinophils Absolute: 285 cells/uL (ref 15–500)
Eosinophils Relative: 3.9 %
HCT: 43.6 % (ref 38.5–50.0)
Hemoglobin: 15.4 g/dL (ref 13.2–17.1)
Lymphs Abs: 2190 cells/uL (ref 850–3900)
MCH: 33.4 pg — ABNORMAL HIGH (ref 27.0–33.0)
MCHC: 35.3 g/dL (ref 32.0–36.0)
MCV: 94.6 fL (ref 80.0–100.0)
MPV: 10.6 fL (ref 7.5–12.5)
Monocytes Relative: 10.3 %
Neutro Abs: 3971 cells/uL (ref 1500–7800)
Neutrophils Relative %: 54.4 %
Platelets: 211 10*3/uL (ref 140–400)
RBC: 4.61 10*6/uL (ref 4.20–5.80)
RDW: 12.9 % (ref 11.0–15.0)
Total Lymphocyte: 30 %
WBC: 7.3 10*3/uL (ref 3.8–10.8)

## 2019-10-24 NOTE — Progress Notes (Signed)
CBC is normal.  CMP shows mildly decreased protein most likely due to chronic disease.

## 2019-10-25 NOTE — Progress Notes (Signed)
Triglycerides are elevated-217.  LDL 130.  Please notify patient and forward labs to PCP.

## 2019-10-27 ENCOUNTER — Ambulatory Visit: Payer: BC Managed Care – PPO | Admitting: Physician Assistant

## 2019-11-06 ENCOUNTER — Other Ambulatory Visit: Payer: Self-pay | Admitting: Rheumatology

## 2019-11-08 NOTE — Telephone Encounter (Signed)
Last Visit:09/09/2019 Next Visit:02/09/2020  Okay to refillper Dr. Deveshwar. 

## 2019-11-15 ENCOUNTER — Other Ambulatory Visit: Payer: Self-pay

## 2019-11-16 ENCOUNTER — Encounter: Payer: Self-pay | Admitting: Family Medicine

## 2019-11-16 ENCOUNTER — Ambulatory Visit (INDEPENDENT_AMBULATORY_CARE_PROVIDER_SITE_OTHER): Payer: BC Managed Care – PPO | Admitting: Family Medicine

## 2019-11-16 VITALS — BP 136/74 | HR 80 | Temp 97.0°F | Ht 70.0 in | Wt 186.0 lb

## 2019-11-16 DIAGNOSIS — I10 Essential (primary) hypertension: Secondary | ICD-10-CM

## 2019-11-16 DIAGNOSIS — Z0000 Encounter for general adult medical examination without abnormal findings: Secondary | ICD-10-CM

## 2019-11-16 NOTE — Progress Notes (Signed)
Established Patient Office Visit  Subjective:  Patient ID: Benjamin Fields, male    DOB: 17-Dec-1960  Age: 59 y.o. MRN: [id number]  CC:  Chief Complaint  Patient presents with  . Establish Care    New patient, no concerns.     HPI Benjamin Fields presents for establishment of care follow-up for his hypertension that has been controlled with losartan.  He is having no issues taking the medication.  Relatively recent diagnosis of atypical rheumatoid arthritis.  He had been taking DMARDs until recent respiratory tract infection where they were discontinued.  He has felt much better status post discontinuation and does not feel as though his rheumatoid has flared.  Morning stiffness and swelling have been minimal he tells me.  Significant past medical history of chronic neuropathy in his lower extremities.  There is been pain and numbness and tingling.  It has been controlled with his gabapentin.  History of depression that has responded well to Cymbalta.  He is due for colonoscopy.  He has been able to achieve dental care.  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  He is married.  Children are grown, twins, and out of the house.  Past Medical History:  Diagnosis Date  . Hypertension   . RA (rheumatoid arthritis) ([place])     Past Surgical History:  Procedure Laterality Date  . CERVICAL SPINE SURGERY  2010  . TONSILLECTOMY      Family History  Problem Relation Age of Onset  . Parkinson's disease Mother   . Prostate cancer Mother   . Atrial fibrillation Mother   . Heart failure Father   . Hypertension Father     Social History   Socioeconomic History  . Marital status: Married    Spouse name: Not on file  . Number of children: Not on file  . Years of education: Not on file  . Highest education level: Not on file  Occupational History  . Not on file  Tobacco Use  . Smoking status: Never Smoker  . Smokeless tobacco: Never Used  Substance and Sexual Activity  . Alcohol  use: Never  . Drug use: Never  . Sexual activity: Not on file  Other Topics Concern  . Not on file  Social History Narrative   Lives home with wife.  workd at [employer]  Education: Masters.  Children 2.  Caffeine 2 cups daily   Social Determinants of Health   Financial Resource Strain:   . Difficulty of Paying Living Expenses:   Food Insecurity:   . Worried About Charity fundraiser in the Last Year:   . Arboriculturist in the Last Year:   Transportation Needs:   . Film/video editor (Medical):   Marland Kitchen Lack of Transportation (Non-Medical):   Physical Activity:   . Days of Exercise per Week:   . Minutes of Exercise per Session:   Stress:   . Feeling of Stress :   Social Connections:   . Frequency of Communication with Friends and Family:   . Frequency of Social Gatherings with Friends and Family:   . Attends Religious Services:   . Active Member of Clubs or Organizations:   . Attends Archivist Meetings:   Marland Kitchen Marital Status:   Intimate Partner Violence:   . Fear of Current or Ex-Partner:   . Emotionally Abused:   Marland Kitchen Physically Abused:   . Sexually Abused:     Outpatient Medications Prior to Visit  Medication Sig Dispense Refill  . DULoxetine (CYMBALTA) 60 MG capsule Take 1 capsule by mouth once daily 30 capsule 0  . gabapentin (NEURONTIN) 100 MG capsule Take 1 capsule (100 mg total) by mouth 3 (three) times daily. Start 1 capsule at bedtime for 4 days, then increase to 1 capsule twice daily for 4 days, them take 1 capsule three times daily. 90 capsule 11  . losartan (COZAAR) 25 MG tablet Take 25 mg by mouth daily. for high blood pressure  1  . Multiple Vitamin (MULTIVITAMIN) tablet Take 1 tablet by mouth daily. OTC    . benzonatate (TESSALON) 100 MG capsule Take 1-2 capsules (100-200 mg total) by mouth every 8 (eight) hours. 30 capsule 0  . fluticasone (FLONASE) 50 MCG/ACT nasal spray Place 2 sprays into both nostrils daily. (Patient not taking: Reported on  123XX123) 1 g 0  . folic acid (FOLVITE) 1 MG tablet Take 2 tablets by mouth once daily (Patient not taking: Reported on 11/16/2019) 180 tablet 0  . OTREXUP 20 MG/0.4ML SOAJ INJECT 20 MG INTO THE SKIN ONCE A WEEK AS DIRECTED (Patient not taking: Reported on 11/16/2019) 12 pen 0  . RINVOQ 15 MG TB24 TAKE 1 TABLET (15 MG) BY MOUTH DAILY. (Patient not taking: Reported on 11/16/2019) 30 tablet 1   No facility-administered medications prior to visit.    Allergies  Allergen Reactions  . Cortisone     FLARE    ROS Review of Systems  Constitutional: Negative.   Respiratory: Negative.   Cardiovascular: Negative.   Gastrointestinal: Negative.   Musculoskeletal: Positive for arthralgias and back pain. Negative for gait problem and joint swelling.  Neurological: Positive for numbness. Negative for weakness.  Psychiatric/Behavioral: Positive for dysphoric mood. Negative for self-injury. The patient is nervous/anxious.    Depression screen [facility] 2/9 11/16/2019  Decreased Interest 1  Down, Depressed, Hopeless 1  PHQ - 2 Score 2  Altered sleeping 1  Tired, decreased energy 1  Change in appetite 1  Feeling bad or failure about yourself  1  Trouble concentrating 1  Moving slowly or fidgety/restless 1  Suicidal thoughts 0  PHQ-9 Score 8  Difficult doing work/chores Somewhat difficult      Objective:    Physical Exam  Constitutional: He is oriented to person, place, and time. He appears well-developed and well-nourished. No distress.  HENT:  Head: Normocephalic and atraumatic.  Right Ear: External ear normal.  Left Ear: External ear normal.  Eyes: Conjunctivae are normal. Right eye exhibits no discharge. Left eye exhibits no discharge. No scleral icterus.  Neck: No JVD present. No tracheal deviation present.  Pulmonary/Chest: Effort normal. No stridor.  Neurological: He is alert and oriented to person, place, and time.  Skin: He is not diaphoretic.  Psychiatric: He has a normal mood and  affect. His behavior is normal.    BP 136/74   Pulse 80   Temp (!) 97 F (36.1 C) (Tympanic)   Ht 5\' 10"  (1.778 m)   Wt 186 lb (84.4 kg)   SpO2 96%   BMI 26.69 kg/m  Wt Readings from Last 3 Encounters:  11/16/19 186 lb (84.4 kg)  09/09/19 189 lb (85.7 kg)  07/08/19 185 lb (83.9 kg)     Health Maintenance Due  Topic Date Due  . TETANUS/TDAP  Never done  . COLONOSCOPY  Never done    There are no preventive care reminders to display for this patient.  Lab Results  Component Value Date   TSH 1.03  03/17/2018   Lab Results  Component Value Date   WBC 7.3 10/22/2019   HGB 15.4 10/22/2019   HCT 43.6 10/22/2019   MCV 94.6 10/22/2019   PLT 211 10/22/2019   Lab Results  Component Value Date   NA 139 10/22/2019   K 4.1 10/22/2019   CO2 25 10/22/2019   GLUCOSE 89 10/22/2019   BUN 20 10/22/2019   CREATININE 1.21 10/22/2019   BILITOT 0.9 10/22/2019   AST 21 10/22/2019   ALT 40 10/22/2019   PROT 5.9 (L) 10/22/2019   CALCIUM 10.0 10/22/2019   Lab Results  Component Value Date   CHOL 209 (H) 10/22/2019   Lab Results  Component Value Date   HDL 43 10/22/2019   Lab Results  Component Value Date   LDLCALC 130 (H) 10/22/2019   Lab Results  Component Value Date   TRIG 217 (H) 10/22/2019   Lab Results  Component Value Date   CHOLHDL 4.9 10/22/2019   No results found for: HGBA1C    Assessment & Plan:   Problem List Items Addressed This Visit      Cardiovascular and Mediastinum   Essential hypertension - Primary     Other   Healthcare maintenance   Relevant Orders   Ambulatory referral to Gastroenterology      No orders of the defined types were placed in this encounter.   Follow-up: Return in about 3 months (around 02/16/2020), or return for physical exam.    Libby Maw, MD

## 2019-11-19 ENCOUNTER — Ambulatory Visit: Payer: BC Managed Care – PPO

## 2019-11-25 ENCOUNTER — Ambulatory Visit: Payer: BC Managed Care – PPO | Attending: Internal Medicine

## 2019-11-25 DIAGNOSIS — Z23 Encounter for immunization: Secondary | ICD-10-CM

## 2019-11-25 NOTE — Progress Notes (Signed)
   Covid-19 Vaccination Clinic  Name:  Benjamin Fields    MRN: [id number] DOB: 01-Jul-1961  11/25/2019  [name] was observed post Covid-19 immunization for 15 minutes without incident. He was provided with Vaccine Information Sheet and instruction to access the V-Safe system.   [name] was instructed to call 911 with any severe reactions post vaccine: Marland Kitchen Difficulty breathing  . Swelling of face and throat  . A fast heartbeat  . A bad rash all over body  . Dizziness and weakness   Immunizations Administered    Name Date Dose VIS Date Route   Pfizer COVID-19 Vaccine 11/25/2019  3:59 PM 0.3 mL 08/06/2019 Intramuscular   Manufacturer: [employer]   Lot: [id number]   [place]: [id number]

## 2019-12-09 ENCOUNTER — Other Ambulatory Visit: Payer: Self-pay | Admitting: Rheumatology

## 2019-12-09 ENCOUNTER — Ambulatory Visit: Payer: BC Managed Care – PPO | Admitting: Physician Assistant

## 2019-12-09 NOTE — Telephone Encounter (Signed)
Last Visit:09/09/2019 Next Visit:02/09/2020  Okay to refillper Dr. Deveshwar. 

## 2019-12-14 DIAGNOSIS — M545 Low back pain: Secondary | ICD-10-CM | POA: Diagnosis not present

## 2019-12-20 ENCOUNTER — Ambulatory Visit: Payer: BC Managed Care – PPO | Attending: Internal Medicine

## 2019-12-20 DIAGNOSIS — Z23 Encounter for immunization: Secondary | ICD-10-CM

## 2019-12-20 NOTE — Progress Notes (Signed)
   Covid-19 Vaccination Clinic  Name:  Benjamin Fields    MRN: [id number] DOB: Jul 06, 1961  12/20/2019  Mr. Zirkelbach was observed post Covid-19 immunization for 15 minutes without incident. He was provided with Vaccine Information Sheet and instruction to access the V-Safe system.   Mr. Loduca was instructed to call 911 with any severe reactions post vaccine: Marland Kitchen Difficulty breathing  . Swelling of face and throat  . A fast heartbeat  . A bad rash all over body  . Dizziness and weakness   Immunizations Administered    Name Date Dose VIS Date Route   Pfizer COVID-19 Vaccine 12/20/2019 10:47 AM 0.3 mL 10/20/2018 Intramuscular   Manufacturer: [place]   Lot: [id number]   [place]: [id number]

## 2019-12-28 NOTE — Progress Notes (Signed)
PATIENT: Benjamin Fields DOB: 1960-12-29  REASON FOR VISIT: follow up HISTORY FROM: patient  Chief Complaint  Patient presents with  . Follow-up    RM 5 here for a f/u     HISTORY OF PRESENT ILLNESS: Today 12/29/19 MARRION Fields is a 59 y.o. male here today for follow up for neuropathy and RLS. He continues duloxetine 60mg  and gabapentin 100mg  TID. He does feel that these medications help. He continues to note intermittent "jumping" of muscles. It is worse in the evenings when he sits in a recliner. Sometimes he feels that his whole body jumps. He does feel that caffeine makes RLS worse. He continues follow up with Deveshwar. He has stopped Rinvoq as it was stopped due to a cold in December. He felt that symptoms were improved off of Rinvoq and is now just monitoring. He has noted burning and tingling of feet are better off medication. He has had some intermittent low back pain over the past few months. He was seen in UC and given "two shots" that have helped. He plans to see PCP in June.   HISTORY: (copied from my note on 07/10/2019)  Benjamin Fields is a 59 y.o. male here today for follow up for polyneuropathy. He continues Cymbalta 60mg  daily. He feels that this helps with his mood. Burning pain seems stable from last visit. Over the past 2-3 weeks, he has had more jerking and twitching of his legs. This worsens at night. Sometimes it wakes him at night. He is followed by rheumatology regularly. He is taking Otreup and Rinvoq for RA.    HISTORY: (copied from Dr Gladstone Lighter note on 09/04/2018)  59 year old male here for evaluation of numbness and tingling.  May 2019 patient had onset of left knee and right shoulder pain and swelling. He was diagnosed with rheumatoid arthritis. He also had red eyes, dry eyes and generalized pain. He has been started on methotrexate in July 2019 and Enbrel in November 2019. He is also been on prednisone.   Early on his diagnosis he noticed  some tingling sensation in his right hand. He also notes that in his right leg. He has mild numbness and tingling on the left side as well. No sensations in his face, neck or lower back.   REVIEW OF SYSTEMS: Out of a complete 14 system review of symptoms, the patient complains only of the following symptoms, neuropathy, restless legs, anxiety, low back pain and all other reviewed systems are negative.  ALLERGIES: Allergies  Allergen Reactions  . Cortisone     FLARE    HOME MEDICATIONS: Outpatient Medications Prior to Visit  Medication Sig Dispense Refill  . DULoxetine (CYMBALTA) 60 MG capsule Take 1 capsule by mouth once daily 30 capsule 0  . losartan (COZAAR) 25 MG tablet Take 25 mg by mouth daily. for high blood pressure  1  . Multiple Vitamin (MULTIVITAMIN) tablet Take 1 tablet by mouth daily. OTC    . OTREXUP 20 MG/0.4ML SOAJ INJECT 20 MG INTO THE SKIN ONCE A WEEK AS DIRECTED 12 pen 0  . gabapentin (NEURONTIN) 100 MG capsule Take 1 capsule (100 mg total) by mouth 3 (three) times daily. Start 1 capsule at bedtime for 4 days, then increase to 1 capsule twice daily for 4 days, them take 1 capsule three times daily. 90 capsule 11  . benzonatate (TESSALON) 100 MG capsule Take 1-2 capsules (100-200 mg total) by mouth every 8 (eight) hours. 30 capsule 0  .  fluticasone (FLONASE) 50 MCG/ACT nasal spray Place 2 sprays into both nostrils daily. (Patient not taking: Reported on 123XX123) 1 g 0  . folic acid (FOLVITE) 1 MG tablet Take 2 tablets by mouth once daily (Patient not taking: Reported on 11/16/2019) 180 tablet 0  . RINVOQ 15 MG TB24 TAKE 1 TABLET (15 MG) BY MOUTH DAILY. (Patient not taking: Reported on 11/16/2019) 30 tablet 1   No facility-administered medications prior to visit.    PAST MEDICAL HISTORY: Past Medical History:  Diagnosis Date  . Hypertension   . RA (rheumatoid arthritis) ([place])     PAST SURGICAL HISTORY: Past Surgical History:  Procedure Laterality Date  .  CERVICAL SPINE SURGERY  2010  . TONSILLECTOMY      FAMILY HISTORY: Family History  Problem Relation Age of Onset  . Parkinson's disease Mother   . Prostate cancer Mother   . Atrial fibrillation Mother   . Heart failure Father   . Hypertension Father     SOCIAL HISTORY: Social History   Socioeconomic History  . Marital status: Married    Spouse name: Not on file  . Number of children: Not on file  . Years of education: Not on file  . Highest education level: Not on file  Occupational History  . Not on file  Tobacco Use  . Smoking status: Never Smoker  . Smokeless tobacco: Never Used  Substance and Sexual Activity  . Alcohol use: Never  . Drug use: Never  . Sexual activity: Not on file  Other Topics Concern  . Not on file  Social History Narrative   Lives home with wife.  workd at [employer]  Education: Masters.  Children 2.  Caffeine 2 cups daily   Social Determinants of Health   Financial Resource Strain:   . Difficulty of Paying Living Expenses:   Food Insecurity:   . Worried About Charity fundraiser in the Last Year:   . Arboriculturist in the Last Year:   Transportation Needs:   . Film/video editor (Medical):   Marland Kitchen Lack of Transportation (Non-Medical):   Physical Activity:   . Days of Exercise per Week:   . Minutes of Exercise per Session:   Stress:   . Feeling of Stress :   Social Connections:   . Frequency of Communication with Friends and Family:   . Frequency of Social Gatherings with Friends and Family:   . Attends Religious Services:   . Active Member of Clubs or Organizations:   . Attends Archivist Meetings:   Marland Kitchen Marital Status:   Intimate Partner Violence:   . Fear of Current or Ex-Partner:   . Emotionally Abused:   Marland Kitchen Physically Abused:   . Sexually Abused:       PHYSICAL EXAM  Vitals:   12/29/19 0808  BP: 122/80  Pulse: 82  Temp: (!) 97 F (36.1 C)  Weight: 193 lb (87.5 kg)  Height: 5\' 10"  (1.778 m)   Body mass  index is 27.69 kg/m.  Generalized: Well developed, in no acute distress  Cardiology: normal rate and rhythm, no murmur noted Respiratory: clear to auscultation bilaterally  Neurological examination  Mentation: Alert oriented to time, place, history taking. Follows all commands speech and language fluent Cranial nerve II-XII: Pupils were equal round reactive to light. Extraocular movements were full, visual field were full on confrontational test. Facial sensation and strength were normal. Uvula tongue midline. Head turning and shoulder shrug  were normal and  symmetric. Motor: The motor testing reveals 5 over 5 strength of all 4 extremities. Good symmetric motor tone is noted throughout.  Sensory: Sensory testing is intact to soft touch on all 4 extremities. No evidence of extinction is noted.  Coordination: Cerebellar testing reveals good finger-nose-finger and heel-to-shin bilaterally.  Gait and station: Gait is normal.  Reflexes: Deep tendon reflexes are symmetric and normal bilaterally.   DIAGNOSTIC DATA (LABS, IMAGING, TESTING) - I reviewed patient records, labs, notes, testing and imaging myself where available.  No flowsheet data found.   Lab Results  Component Value Date   WBC 7.3 10/22/2019   HGB 15.4 10/22/2019   HCT 43.6 10/22/2019   MCV 94.6 10/22/2019   PLT 211 10/22/2019      Component Value Date/Time   NA 139 10/22/2019 1411   K 4.1 10/22/2019 1411   CL 105 10/22/2019 1411   CO2 25 10/22/2019 1411   GLUCOSE 89 10/22/2019 1411   BUN 20 10/22/2019 1411   CREATININE 1.21 10/22/2019 1411   CALCIUM 10.0 10/22/2019 1411   PROT 5.9 (L) 10/22/2019 1411   AST 21 10/22/2019 1411   ALT 40 10/22/2019 1411   BILITOT 0.9 10/22/2019 1411   GFRNONAA 65 10/22/2019 1411   GFRAA 75 10/22/2019 1411   Lab Results  Component Value Date   CHOL 209 (H) 10/22/2019   HDL 43 10/22/2019   LDLCALC 130 (H) 10/22/2019   TRIG 217 (H) 10/22/2019   CHOLHDL 4.9 10/22/2019   No results  found for: HGBA1C Lab Results  Component Value Date   VITAMINB12 1,103 09/04/2018   Lab Results  Component Value Date   TSH 1.03 03/17/2018       ASSESSMENT AND PLAN 59 y.o. year old male  has a past medical history of Hypertension and RA (rheumatoid arthritis) ([place]). here with     ICD-10-CM   1. Polyneuropathy  G62.9   2. RLS (restless legs syndrome)  G25.81     Benjamin Fields is doing well today. He feels that neuropathy has improved since stopping RA medication. Duloxetine continues to help. He has noted increased restlessness of legs in the evenings and intermittent low back pain over the past few months. We will increase gabapentin to 100mg  in am, 100mg  at lunch and 300mg  at bedtime. He will monitor low back pain for now and follow up with PCP as planned. Complementary therapies advised. Healthy lifestyle habits encouraged. He will follow up with me in 6 months, sooner if needed. He verbalizes understanding and agreement with this plan.    No orders of the defined types were placed in this encounter.    Meds ordered this encounter  Medications  . gabapentin (NEURONTIN) 100 MG capsule    Sig: Take 1 capsule in am, 1 capsule at lunch and 3 capsules at bedtime.    Dispense:  360 capsule    Refill:  3    Order Specific Question:   Supervising Provider    Answer:   Melvenia Beam [id number]      I spent 25 minutes with the patient. 50% of this time was spent counseling and educating patient on plan of care and medications.    Debbora Presto, FNP-C 12/29/2019, 9:08 AM [facility] [address] [place], [place] [address] [phone]

## 2019-12-29 ENCOUNTER — Encounter: Payer: Self-pay | Admitting: Family Medicine

## 2019-12-29 ENCOUNTER — Ambulatory Visit: Payer: BC Managed Care – PPO | Admitting: Family Medicine

## 2019-12-29 ENCOUNTER — Other Ambulatory Visit: Payer: Self-pay

## 2019-12-29 VITALS — BP 122/80 | HR 82 | Temp 97.0°F | Ht 70.0 in | Wt 193.0 lb

## 2019-12-29 DIAGNOSIS — G629 Polyneuropathy, unspecified: Secondary | ICD-10-CM | POA: Diagnosis not present

## 2019-12-29 DIAGNOSIS — G2581 Restless legs syndrome: Secondary | ICD-10-CM | POA: Diagnosis not present

## 2019-12-29 MED ORDER — GABAPENTIN 100 MG PO CAPS: ORAL_CAPSULE | ORAL | 3 refills | Status: DC

## 2019-12-29 NOTE — Patient Instructions (Signed)
We will increase gabapentin to 100mg  in am, 100mg  at lunch and 300mg  at bedtime. I am hopeful this will help with restless legs and neuropathy. Continue duloxetine 60mg  daily currently prescribed by rheumatology.   Follow up with me in 6 months   Neuropathic Pain Neuropathic pain is pain caused by damage to the nerves that are responsible for certain sensations in your body (sensory nerves). The pain can be caused by:  Damage to the sensory nerves that send signals to your spinal cord and brain (peripheral nervous system).  Damage to the sensory nerves in your brain or spinal cord (central nervous system). Neuropathic pain can make you more sensitive to pain. Even a minor sensation can feel very painful. This is usually a long-term condition that can be difficult to treat. The type of pain differs from person to person. It may:  Start suddenly (acute), or it may develop slowly and last for a long time (chronic).  Come and go as damaged nerves heal, or it may stay at the same level for years.  Cause emotional distress, loss of sleep, and a lower quality of life. What are the causes? The most common cause of this condition is diabetes. Many other diseases and conditions can also cause neuropathic pain. Causes of neuropathic pain can be classified as:  Toxic. This is caused by medicines and chemicals. The most common cause of toxic neuropathic pain is damage from cancer treatments (chemotherapy).  Metabolic. This can be caused by: ? Diabetes. This is the most common disease that damages the nerves. ? Lack of vitamin B from long-term alcohol abuse.  Traumatic. Any injury that cuts, crushes, or stretches a nerve can cause damage and pain. A common example is feeling pain after losing an arm or leg (phantom limb pain).  Compression-related. If a sensory nerve gets trapped or compressed for a long period of time, the blood supply to the nerve can be cut off.  Vascular. Many blood vessel  diseases can cause neuropathic pain by decreasing blood supply and oxygen to nerves.  Autoimmune. This type of pain results from diseases in which the body's defense system (immune system) mistakenly attacks sensory nerves. Examples of autoimmune diseases that can cause neuropathic pain include lupus and multiple sclerosis.  Infectious. Many types of viral infections can damage sensory nerves and cause pain. Shingles infection is a common cause of this type of pain.  Inherited. Neuropathic pain can be a symptom of many diseases that are passed down through families (genetic). What increases the risk? You are more likely to develop this condition if:  You have diabetes.  You smoke.  You drink too much alcohol.  You are taking certain medicines, including medicines that kill cancer cells (chemotherapy) or that treat immune system disorders. What are the signs or symptoms? The main symptom is pain. Neuropathic pain is often described as:  Burning.  Shock-like.  Stinging.  Hot or cold.  Itching. How is this diagnosed? No single test can diagnose neuropathic pain. It is diagnosed based on:  Physical exam and your symptoms. Your health care provider will ask you about your pain. You may be asked to use a pain scale to describe how bad your pain is.  Tests. These may be done to see if you have a high sensitivity to pain and to help find the cause and location of any sensory nerve damage. They include: ? Nerve conduction studies to test how well nerve signals travel through your sensory nerves (electrodiagnostic  testing). ? Stimulating your sensory nerves through electrodes on your skin and measuring the response in your spinal cord and brain (somatosensory evoked potential).  Imaging studies, such as: ? X-rays. ? CT scan. ? MRI. How is this treated? Treatment for neuropathic pain may change over time. You may need to try different treatment options or a combination of treatments.  Some options include:  Treating the underlying cause of the neuropathy, such as diabetes, kidney disease, or vitamin deficiencies.  Stopping medicines that can cause neuropathy, such as chemotherapy.  Medicine to relieve pain. Medicines may include: ? Prescription or over-the-counter pain medicine. ? Anti-seizure medicine. ? Antidepressant medicines. ? Pain-relieving patches that are applied to painful areas of skin. ? A medicine to numb the area (local anesthetic), which can be injected as a nerve block.  Transcutaneous nerve stimulation. This uses electrical currents to block painful nerve signals. The treatment is painless.  Alternative treatments, such as: ? Acupuncture. ? Meditation. ? Massage. ? Physical therapy. ? Pain management programs. ? Counseling. Follow these instructions at home: Medicines   Take over-the-counter and prescription medicines only as told by your health care provider.  Do not drive or use heavy machinery while taking prescription pain medicine.  If you are taking prescription pain medicine, take actions to prevent or treat constipation. Your health care provider may recommend that you: ? Drink enough fluid to keep your urine pale yellow. ? Eat foods that are high in fiber, such as fresh fruits and vegetables, whole grains, and beans. ? Limit foods that are high in fat and processed sugars, such as fried or sweet foods. ? Take an over-the-counter or prescription medicine for constipation. Lifestyle   Have a good support system at home.  Consider joining a chronic pain support group.  Do not use any products that contain nicotine or tobacco, such as cigarettes and e-cigarettes. If you need help quitting, ask your health care provider.  Do not drink alcohol. General instructions  Learn as much as you can about your condition.  Work closely with all your health care providers to find the treatment plan that works best for you.  Ask your  health care provider what activities are safe for you.  Keep all follow-up visits as told by your health care provider. This is important. Contact a health care provider if:  Your pain treatments are not working.  You are having side effects from your medicines.  You are struggling with tiredness (fatigue), mood changes, depression, or anxiety. Summary  Neuropathic pain is pain caused by damage to the nerves that are responsible for certain sensations in your body (sensory nerves).  Neuropathic pain may come and go as damaged nerves heal, or it may stay at the same level for years.  Neuropathic pain is usually a long-term condition that can be difficult to treat. Consider joining a chronic pain support group. This information is not intended to replace advice given to you by your health care provider. Make sure you discuss any questions you have with your health care provider. Document Revised: 12/03/2018 Document Reviewed: 08/29/2017 Elsevier Patient Education  [place].   Restless Legs Syndrome Restless legs syndrome is a condition that causes uncomfortable feelings or sensations in the legs, especially while sitting or lying down. The sensations usually cause an overwhelming urge to move the legs. The arms can also sometimes be affected. The condition can range from mild to severe. The symptoms often interfere with a person's ability to sleep. What are  the causes? The cause of this condition is not known. What increases the risk? The following factors may make you more likely to develop this condition:  Being older than 50.  Pregnancy.  Being a woman. In general, the condition is more common in women than in men.  A family history of the condition.  Having iron deficiency.  Overuse of caffeine, nicotine, or alcohol.  Certain medical conditions, such as kidney disease, Parkinson's disease, or nerve damage.  Certain medicines, such as those for high blood  pressure, nausea, colds, allergies, depression, and some heart conditions. What are the signs or symptoms? The main symptom of this condition is uncomfortable sensations in the legs, such as:  Pulling.  Tingling.  Prickling.  Throbbing.  Crawling.  Burning. Usually, the sensations:  Affect both sides of the body.  Are worse when you sit or lie down.  Are worse at night. These may wake you up or make it difficult to fall asleep.  Make you have a strong urge to move your legs.  Are temporarily relieved by moving your legs. The arms can also be affected, but this is rare. People who have this condition often have tiredness during the day because of their lack of sleep at night. How is this diagnosed? This condition may be diagnosed based on:  Your symptoms.  Blood tests. In some cases, you may be monitored in a sleep lab by a specialist (a sleep study). This can detect any disruptions in your sleep. How is this treated? This condition is treated by managing the symptoms. This may include:  Lifestyle changes, such as exercising, using relaxation techniques, and avoiding caffeine, alcohol, or tobacco.  Medicines. Anti-seizure medicines may be tried first. Follow these instructions at home:     General instructions  Take over-the-counter and prescription medicines only as told by your health care provider.  Use methods to help relieve the uncomfortable sensations, such as: ? Massaging your legs. ? Walking or stretching. ? Taking a cold or hot bath.  Keep all follow-up visits as told by your health care provider. This is important. Lifestyle  Practice good sleep habits. For example, go to bed and get up at the same time every day. Most adults should get 7-9 hours of sleep each night.  Exercise regularly. Try to get at least 30 minutes of exercise most days of the week.  Practice ways of relaxing, such as yoga or meditation.  Avoid caffeine and alcohol.  Do not  use any products that contain nicotine or tobacco, such as cigarettes and e-cigarettes. If you need help quitting, ask your health care provider. Contact a health care provider if:  Your symptoms get worse or they do not improve with treatment. Summary  Restless legs syndrome is a condition that causes uncomfortable feelings or sensations in the legs, especially while sitting or lying down.  The symptoms often interfere with a person's ability to sleep.  This condition is treated by managing the symptoms. You may need to make lifestyle changes or take medicines. This information is not intended to replace advice given to you by your health care provider. Make sure you discuss any questions you have with your health care provider. Document Revised: 09/01/2017 Document Reviewed: 09/01/2017 Elsevier Patient Education  [place].

## 2020-01-05 ENCOUNTER — Other Ambulatory Visit: Payer: Self-pay | Admitting: Rheumatology

## 2020-01-05 NOTE — Telephone Encounter (Signed)
Last Visit:09/09/2019 Next Visit:02/09/2020  Okay to refillper Dr. Deveshwar. 

## 2020-01-08 DIAGNOSIS — M25552 Pain in left hip: Secondary | ICD-10-CM | POA: Diagnosis not present

## 2020-01-08 DIAGNOSIS — M545 Low back pain: Secondary | ICD-10-CM | POA: Diagnosis not present

## 2020-01-12 ENCOUNTER — Encounter: Payer: Self-pay | Admitting: Family Medicine

## 2020-01-13 IMAGING — MR MR KNEE*L* W/O CM
4 of 7 series · 22 of 40 positions shown · non-contrast
Comparison: None.

CLINICAL DATA: Knee pain and swelling.

EXAM:
MRI OF THE LEFT KNEE WITHOUT CONTRAST
TECHNIQUE: Multiplanar, multisequence MR imaging of the knee was performed. No
intravenous contrast was administered.

[Series 3: T2 fat-sat · axial · 4.0mm · 0.50mm/px · z∈[-61,+84]mm · 7 of 30 slices shown]
[im 1/30]
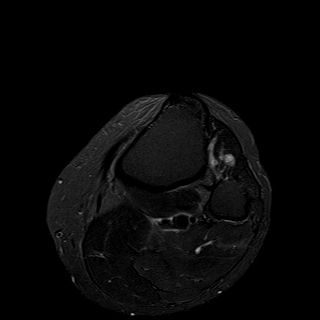
[im 5/30]
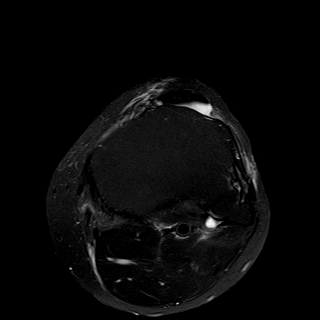
[im 10/30]
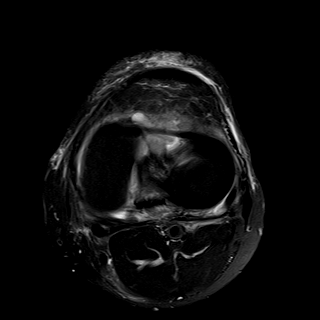
[im 15/30]
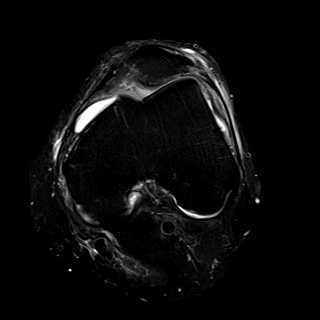
[im 20/30]
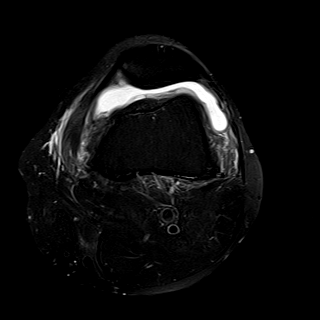
[im 25/30]
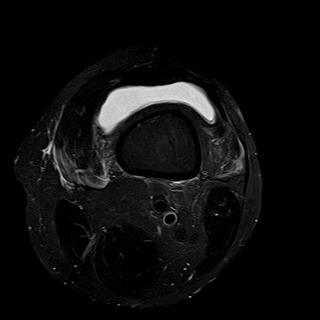
[im 30/30]
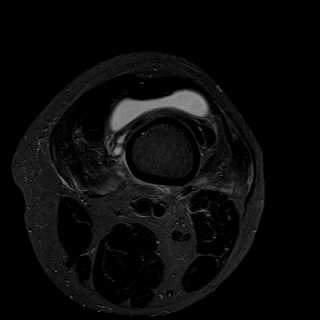

[Series 7: PD fat-sat · sagittal · 3.0mm · 0.35mm/px · 6 of 27 slices shown (1 of 3)]
[im 1/27]
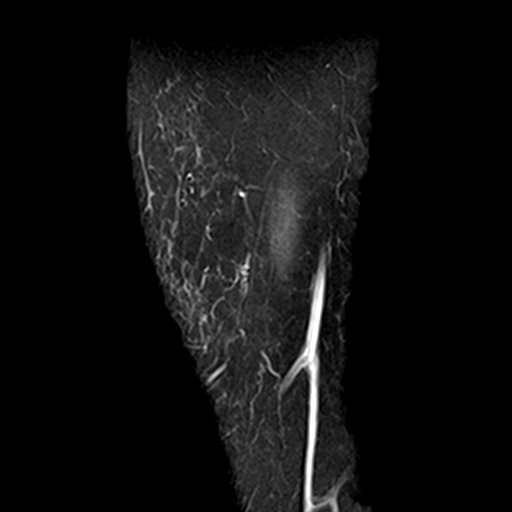
[im 6/27]
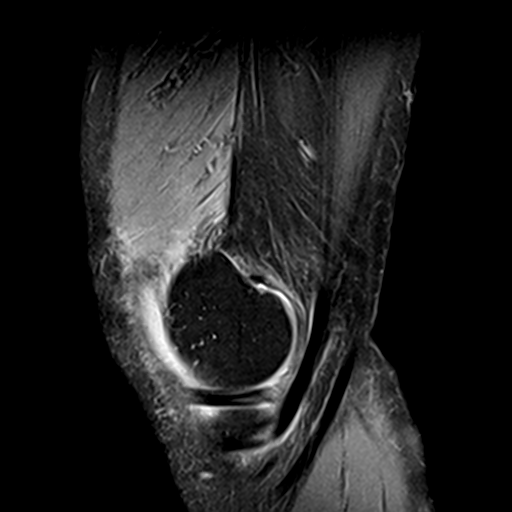
[im 11/27]
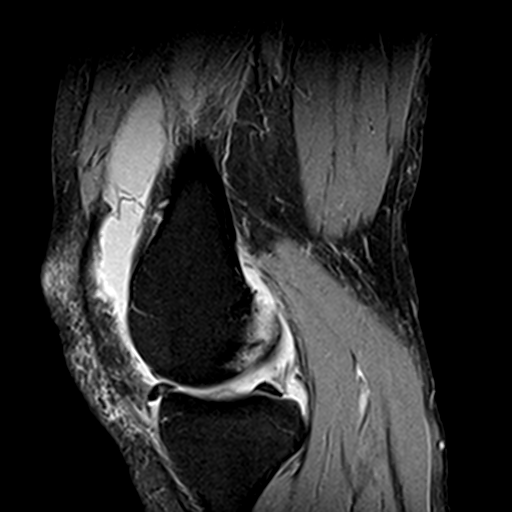
[im 16/27]
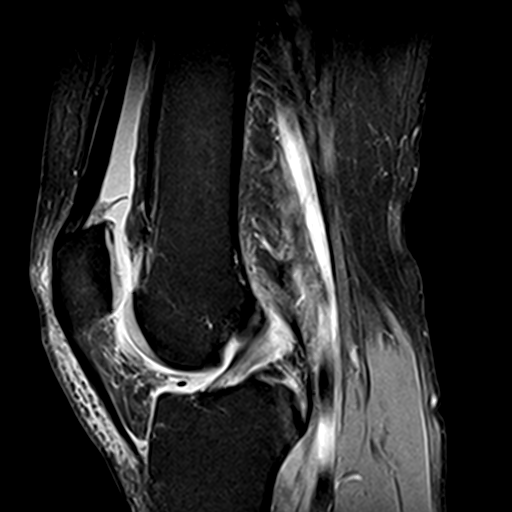
[im 21/27]
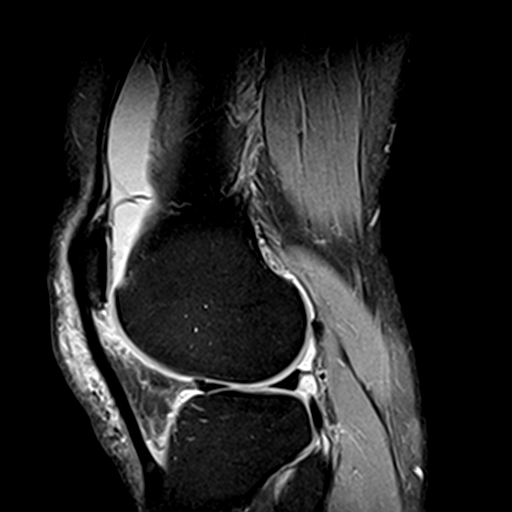
[im 27/27]
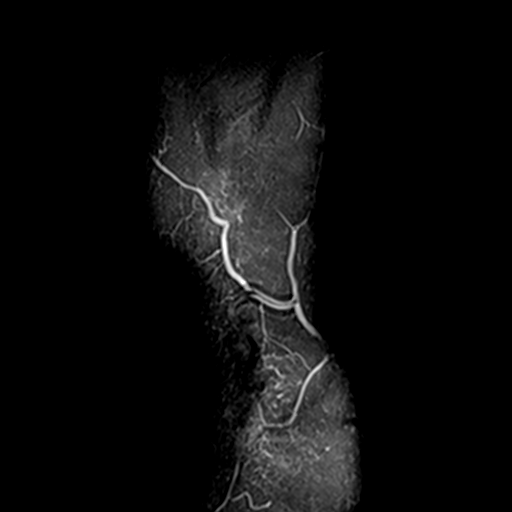

[Series 8: PD fat-sat · coronal · 4.0mm · 0.35mm/px · 6 of 25 slices shown (2 of 3)]
[im 1/25]
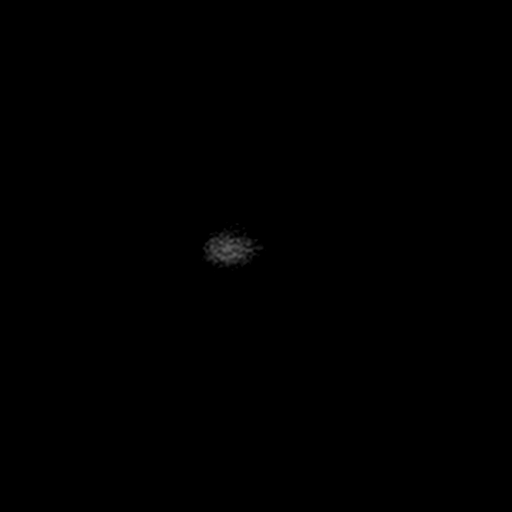
[im 5/25]
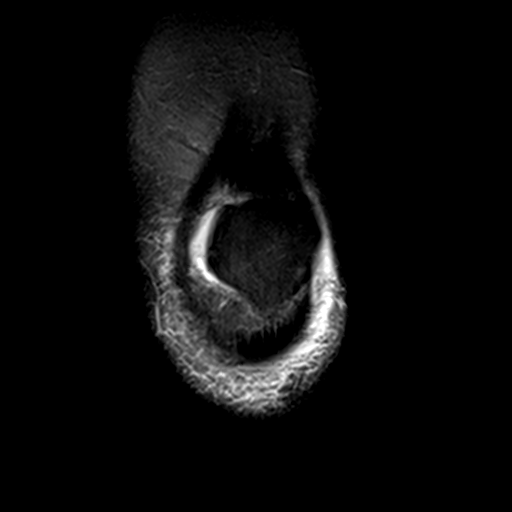
[im 10/25]
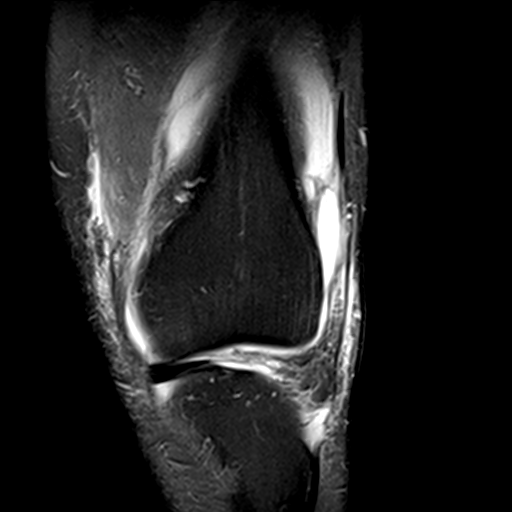
[im 15/25]
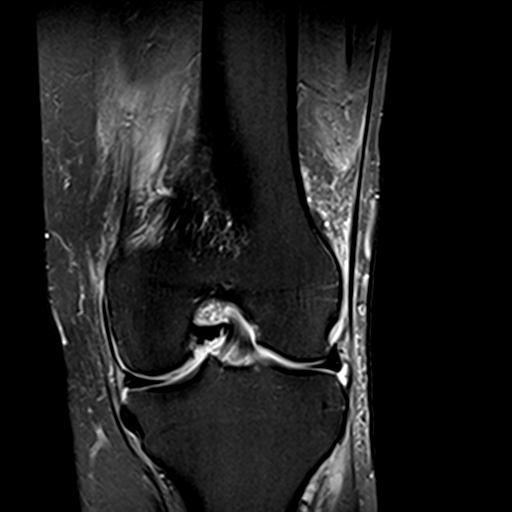
[im 20/25]
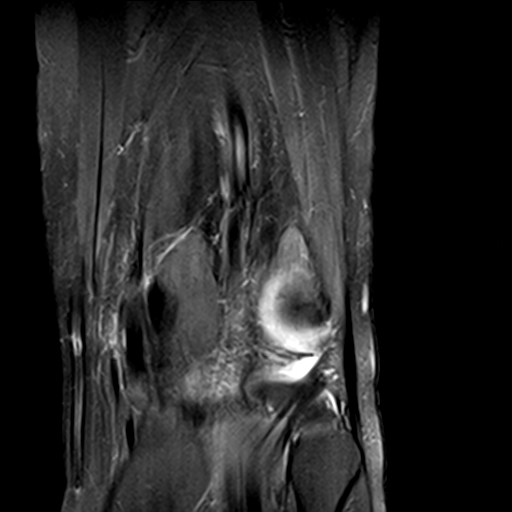
[im 25/25]
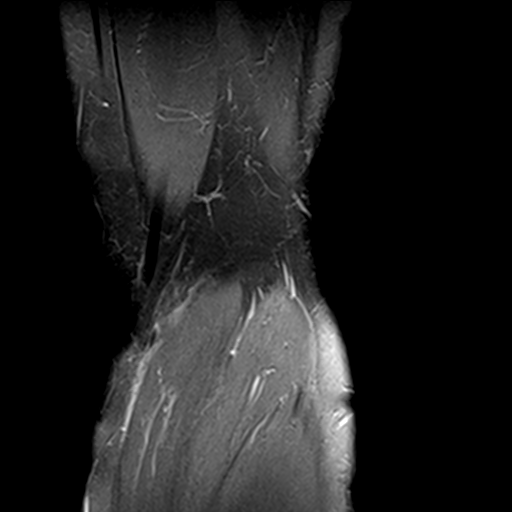

[Series 9: PD fat-sat · coronal · 2.0mm · 0.29mm/px · 3 of 11 slices shown (3 of 3)]
[im 1/11]
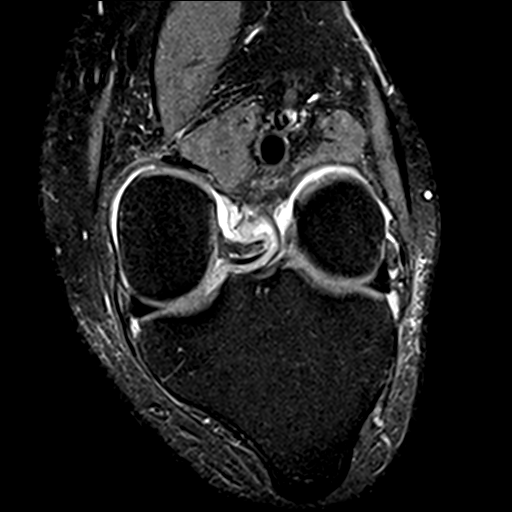
[im 6/11]
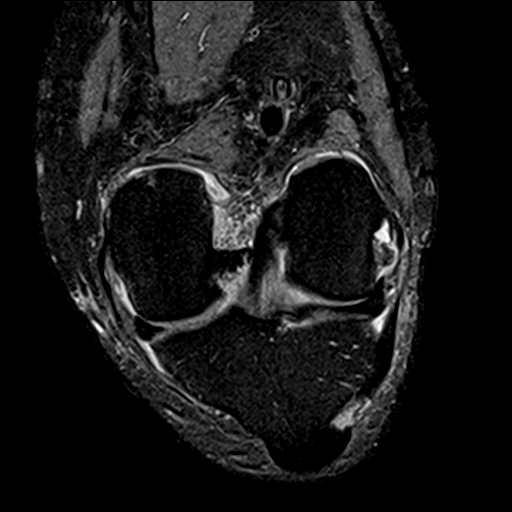
[im 11/11]
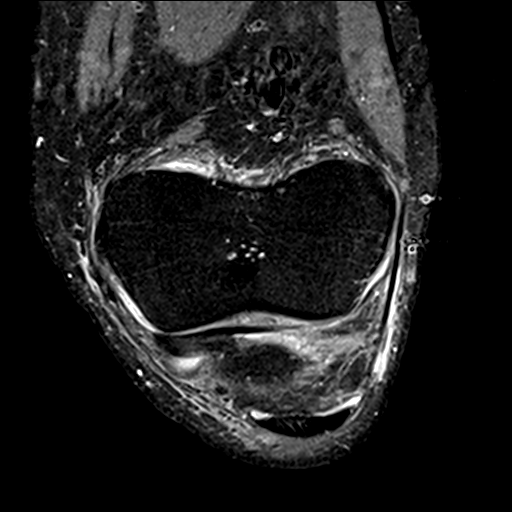

[22 of 40 positions shown; findings below may reference images not displayed]

FINDINGS: MENISCI

Medial meniscus: Intrasubstance degenerative type signal changes
involving the posterior horn but no discrete tear.

Lateral meniscus:  Intact

LIGAMENTS

Cruciates:  Intact

Collaterals:  Intact

CARTILAGE

Patellofemoral: Mild degenerative chondrosis. There is also slight
lateral tilt of the patella and a very short steep medial facet.
Narrowing of the lateral patellofemoral joint.

Medial:  Mild degenerative chondrosis.

Lateral:  Mild degenerative chondrosis.

Joint: Large joint effusion and mild synovitis. Superior and medial
patellar plica noted.

Popliteal Fossa:  Small leaking Baker's cyst.

Extensor Mechanism: The patella retinacular structures are intact
and the quadriceps and patellar tendons are intact. Mild lateral
tilt of the patella. The TT-TG distance is slightly widened at 21
mm. There is also edema like signal abnormality in the upper lateral
aspect of Hoffa's fat which may suggest lateral patellar compression
syndrome.

Bones: No acute bony findings. No bone contusion, marrow edema or
osteochondral lesion.

Other: Nonspecific edema like signal abnormality in the vastus
medialis and lateralis muscles. This could represent muscle strain
or overuse syndrome. Nonspecific myositis could be related to
collagen vascular disease, polymyositis or possible drug reaction
(statins). The hamstring muscles appear normal.
IMPRESSION: 1. Patellofemoral joint degenerative changes and findings suggest
lateral patellar compression syndrome. Slight lateral tilt of the
patella and widened TT-TG distance.
2. Intact ligamentous structures and no acute bony findings.
3. Degenerative changes involving the posterior horn of the medial
meniscus but no discrete tear.
4. Large joint effusion and mild synovitis. Small leaking Baker's
cyst.
5. Nonspecific edema like signal abnormality in the vastus medialis
and lateralis muscles as discussed above.

## 2020-01-20 ENCOUNTER — Encounter: Payer: Self-pay | Admitting: Family Medicine

## 2020-01-26 NOTE — Progress Notes (Deleted)
Office Visit Note  Patient: Benjamin Fields             Date of Birth: 04/27/1961           MRN: [id number]             PCP: Libby Maw, MD Referring: Lennie Odor, [place] Visit Date: 02/09/2020 Occupation: @GUAROCC @  Subjective:  No chief complaint on file.   History of Present Illness: Benjamin Fields is a 59 y.o. male ***   Activities of Daily Living:  Patient reports morning stiffness for *** {minute/hour:19697}.   Patient {ACTIONS;DENIES/REPORTS:21021675::"Denies"} nocturnal pain.  Difficulty dressing/grooming: {ACTIONS;DENIES/REPORTS:21021675::"Denies"} Difficulty climbing stairs: {ACTIONS;DENIES/REPORTS:21021675::"Denies"} Difficulty getting out of chair: {ACTIONS;DENIES/REPORTS:21021675::"Denies"} Difficulty using hands for taps, buttons, cutlery, and/or writing: {ACTIONS;DENIES/REPORTS:21021675::"Denies"}  No Rheumatology ROS completed.   PMFS History:  Patient Active Problem List   Diagnosis Date Noted  . Healthcare maintenance 11/16/2019  . Rheumatoid arthritis of multiple sites with negative rheumatoid factor ([place]) 05/19/2018  . Tinea versicolor 05/19/2018  . Essential hypertension 03/17/2018  . DDD (degenerative disc disease), cervical 03/16/2018    Past Medical History:  Diagnosis Date  . Hypertension   . RA (rheumatoid arthritis) ([facility])     Family History  Problem Relation Age of Onset  . Parkinson's disease Mother   . Prostate cancer Mother   . Atrial fibrillation Mother   . Heart failure Father   . Hypertension Father    Past Surgical History:  Procedure Laterality Date  . CERVICAL SPINE SURGERY  2010  . TONSILLECTOMY     Social History   Social History Narrative   Lives home with wife.  workd at [employer]  Education: Masters.  Children 2.  Caffeine 2 cups daily   Immunization History  Administered Date(s) Administered  . Influenza Whole 07/27/2019  . Influenza,inj,Quad PF,6+ Mos 07/06/2016  . PFIZER SARS-COV-2 Vaccination  11/25/2019, 12/20/2019     Objective: Vital Signs: There were no vitals taken for this visit.   Physical Exam   Musculoskeletal Exam: ***  CDAI Exam: CDAI Score: -- Patient Global: --; Provider Global: -- Swollen: --; Tender: -- Joint Exam 02/09/2020   No joint exam has been documented for this visit   There is currently no information documented on the homunculus. Go to the Rheumatology activity and complete the homunculus joint exam.  Investigation: No additional findings.  Imaging: No results found.  Recent Labs: Lab Results  Component Value Date   WBC 7.3 10/22/2019   HGB 15.4 10/22/2019   PLT 211 10/22/2019   NA 139 10/22/2019   K 4.1 10/22/2019   CL 105 10/22/2019   CO2 25 10/22/2019   GLUCOSE 89 10/22/2019   BUN 20 10/22/2019   CREATININE 1.21 10/22/2019   BILITOT 0.9 10/22/2019   AST 21 10/22/2019   ALT 40 10/22/2019   PROT 5.9 (L) 10/22/2019   CALCIUM 10.0 10/22/2019   GFRAA 75 10/22/2019   QFTBGOLDPLUS NEGATIVE 03/25/2019    Speciality Comments: Prior therapy: Enbrel and Humira (inadequate reponse)  Procedures:  No procedures performed Allergies: Cortisone   Assessment / Plan:     Visit Diagnoses: No diagnosis found.  Orders: No orders of the defined types were placed in this encounter.  No orders of the defined types were placed in this encounter.   Face-to-face time spent with patient was *** minutes. Greater than 50% of time was spent in counseling and coordination of care.  Follow-Up Instructions: No follow-ups on file.   Earnestine Mealing, CMA  Note - This record has been created using [employer].  Chart creation errors have been sought, but may not always  have been located. Such creation errors do not reflect on  the standard of medical care.

## 2020-01-27 NOTE — Progress Notes (Signed)
I reviewed note and agree with plan.   Penni Bombard, MD [id number], [id number] PM Certified in Neurology, Neurophysiology and Neuroimaging  [facility] [address] [place], [place] [address] [phone]

## 2020-02-07 ENCOUNTER — Other Ambulatory Visit: Payer: Self-pay | Admitting: Rheumatology

## 2020-02-07 NOTE — Telephone Encounter (Signed)
Last Visit:09/09/2019 Next Visit:02/09/2020  Okay to refillper Dr. Estanislado Pandy.

## 2020-02-09 ENCOUNTER — Ambulatory Visit: Payer: BC Managed Care – PPO | Admitting: Rheumatology

## 2020-02-14 NOTE — Progress Notes (Signed)
Office Visit Note  Patient: Benjamin Fields             Date of Birth: [birth year]           MRN: [id number]             PCP: Libby Maw, MD Referring: Lennie Odor, [place] Visit Date: 02/24/2020 Occupation: @GUAROCC @  Subjective:  Lower back pain  History of Present Illness: Benjamin Fields is a 59 y.o. male with history of seronegative rheumatoid arthritis and DDD.  Patient reports that he had an infection in January and he stopped Rinvoq and Otrexup.  He was concerned about the pandemic and did not restart his medications.  He states about 2 months ago he started having lower back pain radiating to his left lower extremity.  He denies any joint pain or joint swelling otherwise.  He went to see Dr. Lorin Mercy for his lower back pain who did x-rays which were unremarkable per patient.  He is a scheduled to have CT myelogram.  He has been experiencing some soreness in his right shoulder joint for the last 2 weeks.  He has been experiencing some discomfort in his left hip joint and difficulty walking.  He had x-ray by Dr. Lorin Mercy which was unremarkable.  Activities of Daily Living:  Patient reports morning stiffness for 1-2  hours.   Patient Reports nocturnal pain.  Difficulty dressing/grooming: Denies Difficulty climbing stairs: Denies Difficulty getting out of chair: Denies Difficulty using hands for taps, buttons, cutlery, and/or writing: Denies  Review of Systems  Constitutional: Positive for fatigue. Negative for night sweats.  HENT: Negative for mouth sores, mouth dryness and nose dryness.   Eyes: Negative for redness and dryness.  Respiratory: Negative for shortness of breath and difficulty breathing.   Cardiovascular: Negative for chest pain, palpitations, hypertension, irregular heartbeat and swelling in legs/feet.  Gastrointestinal: Negative for constipation and diarrhea.  Endocrine: Negative for increased urination.  Genitourinary: Negative for painful urination.    Musculoskeletal: Positive for arthralgias, joint pain and morning stiffness. Negative for joint swelling, myalgias, muscle weakness, muscle tenderness and myalgias.  Skin: Negative for color change, rash, hair loss, nodules/bumps, skin tightness, ulcers and sensitivity to sunlight.  Allergic/Immunologic: Negative for susceptible to infections.  Neurological: Negative for dizziness, fainting, memory loss, night sweats and weakness.  Hematological: Negative for swollen glands.  Psychiatric/Behavioral: Negative for depressed mood and sleep disturbance. The patient is not nervous/anxious.     PMFS History:  Patient Active Problem List   Diagnosis Date Noted  . Low back pain 02/17/2020  . Left hip pain 02/17/2020  . Healthcare maintenance 11/16/2019  . Rheumatoid arthritis of multiple sites with negative rheumatoid factor ([place]) 05/19/2018  . Tinea versicolor 05/19/2018  . Essential hypertension 03/17/2018  . DDD (degenerative disc disease), cervical 03/16/2018    Past Medical History:  Diagnosis Date  . Hypertension   . RA (rheumatoid arthritis) (HCC)     Family History  Problem Relation Age of Onset  . Parkinson's disease Mother   . Prostate cancer Mother   . Atrial fibrillation Mother   . Heart failure Father   . Hypertension Father    Past Surgical History:  Procedure Laterality Date  . CERVICAL SPINE SURGERY  2010  . TONSILLECTOMY     Social History   Social History Narrative   Lives home with wife.  workd at [employer]  Education: Masters.  Children 2.  Caffeine 2 cups daily   Immunization History  Administered  Date(s) Administered  . Influenza Whole 07/27/2019  . Influenza,inj,Quad PF,6+ Mos 07/06/2016  . PFIZER SARS-COV-2 Vaccination 11/25/2019, 12/20/2019  . Tdap 08/27/2019     Objective: Vital Signs: BP (!) 143/87 (BP Location: Left Arm, Patient Position: Sitting, Cuff Size: Small)   Pulse 96   Resp 12   Ht 5' 10"  (1.778 m)   Wt 194 lb 9.6 oz (88.3 kg)    BMI 27.92 kg/m    Physical Exam Vitals and nursing note reviewed.  Constitutional:      Appearance: He is well-developed.  HENT:     Head: Normocephalic and atraumatic.  Eyes:     Conjunctiva/sclera: Conjunctivae normal.     Pupils: Pupils are equal, round, and reactive to light.  Pulmonary:     Effort: Pulmonary effort is normal.  Abdominal:     General: Bowel sounds are normal.     Palpations: Abdomen is soft.  Musculoskeletal:     Cervical back: Normal range of motion and neck supple.  Skin:    General: Skin is warm and dry.     Capillary Refill: Capillary refill takes less than 2 seconds.  Neurological:     Mental Status: He is alert and oriented to person, place, and time.  Psychiatric:        Behavior: Behavior normal.      Musculoskeletal Exam: C-spine thoracic and lumbar spine were in good range of motion.  He has some discomfort range of motion of his lumbar spine.  He had painful range of motion of his right shoulder joint although he had good range of motion.  He is palpable effusion in his right shoulder joint with tenderness.  Elbow joints, wrist joints, MCPs PIPs and DIPs with good range of motion with no synovitis.  He had limited range of motion of his left hip with discomfort.  He had palpable Baker's cyst behind his left knee.  All other joints are in full range of motion with no synovitis.  CDAI Exam: CDAI Score: 3.8  Patient Global: 3 mm; Provider Global: 5 mm Swollen: 2 ; Tender: 2  Joint Exam 02/24/2020      Right  Left  Glenohumeral  Swollen Tender     Hip      Tender  Knee     Swollen      Investigation: No additional findings.  Imaging: XR Foot 2 Views Left  Result Date: 02/24/2020 First MTP, PIP and DIP narrowing was noted.  None of the other MTP showed narrowing.  No intertarsal or tibiotalar joint space narrowing was noted.  No erosive changes were noted. Impression: These findings are consistent with osteoarthritis of the foot.  XR Foot  2 Views Right  Result Date: 02/24/2020 First MTP, PIP and DIP narrowing was noted.  None of the other MTP showed narrowing.  No intertarsal or tibiotalar joint space narrowing was noted.  No erosive changes were noted. Impression: These findings are consistent with osteoarthritis of the foot.  XR Hand 2 View Left  Result Date: 02/24/2020 Juxta-articular osteopenia was noted.  PIP and DIP narrowing was noted.  CMC narrowing was noted.  No MCP, intercarpal radiocarpal joint space narrowing was noted.  No erosive changes were noted. Impression: These findings are consistent with rheumatoid arthritis and osteoarthritis overlap.  XR Hand 2 View Right  Result Date: 02/24/2020 Juxta-articular osteopenia was noted.  PIP and DIP narrowing was noted.  CMC narrowing was noted.  No MCP, intercarpal radiocarpal joint space narrowing was noted.  No erosive changes were noted. Impression: These findings are consistent with rheumatoid arthritis and osteoarthritis overlap.   Recent Labs: Lab Results  Component Value Date   WBC 8.3 02/17/2020   HGB 15.1 02/17/2020   PLT 248.0 02/17/2020   NA 140 02/17/2020   K 4.5 02/17/2020   CL 105 02/17/2020   CO2 28 02/17/2020   GLUCOSE 102 (H) 02/17/2020   BUN 18 02/17/2020   CREATININE 1.19 02/17/2020   BILITOT 0.6 02/17/2020   ALKPHOS 74 02/17/2020   AST 16 02/17/2020   ALT 26 02/17/2020   PROT 6.0 02/17/2020   ALBUMIN 4.2 02/17/2020   CALCIUM 9.6 02/17/2020   GFRAA 75 10/22/2019   QFTBGOLDPLUS NEGATIVE 03/25/2019    Speciality Comments: Prior therapy: Enbrel and Humira (inadequate reponse)  Procedures:  No procedures performed Allergies: Cortisone   Assessment / Plan:     Visit Diagnoses: Rheumatoid arthritis of multiple sites with negative rheumatoid factor (HCC) - RF-, anti-CCP-, 14-3-3 eta-, ESR 2: -He has been off Rinvoq and Otrexup since January 2021.  Patient was concerned about COVID-19 and stopped the medications.  He states he did well for  several months.  He has been experiencing discomfort in his right shoulder joint for the last 2 weeks.  He has been having pain and discomfort in his left hip and lower back for the last 2 months.  He also has felt a mass behind his left knee.  On examination today he had a small effusion in his right shoulder, he had painful range of motion of his left hip and a Baker's cyst palpable and behind his left knee joint.  I detailed discussion with the patient regarding restarting his medications.  He is having a flare of his arthritis.  He states he has Rinvoq at home and he will start taking it.  He will call us for refill.  He wants to hold off for transfer for right now.  Plan: XR Hand 2 View Right, XR Hand 2 View Left, XR Foot 2 Views Right, XR Foot 2 Views Left.  X-rays were unremarkable without any changes.    High risk medication use - Rinvoq 15 mg 1 tablet daily started in October 2020, Otrexup 20 mg every 7 days. (inadequate response to Enbrel, Humira, Actemra) - Plan: QuantiFERON-TB Gold Plus today.  He had recent CBC and CMP with his PCP which was normal.  I have advised him to get labs every 3 months.  He will contact us for refill on Rinvoq when needed.  Effusion of right shoulder -the effusion in his right shoulder joint completely resolved on Rinvoq and Otrexup combination.  His effusion has recurred and he has discomfort in his right shoulder joint.  Effusion of left knee -he did not have effusion today but the Baker's cyst was palpable.  Left hip pain-he was seen by Dr. Lorin Mercy.  I reviewed the x-rays of his left hip which were unremarkable.  He has limited painful range of motion.  I believe he has a flare of rheumatoid arthritis and discomfort in his left hip.  DDD (degenerative disc disease), cervical-he has some limitation with stiffness in his cervical spine.  Numbness and tingling of both feet - Improved with taking Gabapentin.    Tinea versicolor - Resolved   Other  fatigue  Essential hypertension-his blood pressure is elevated today.  Have advised him to monitor blood pressure closely.  Orders: Orders Placed This Encounter  Procedures  . XR Hand 2 View Right  .  XR Hand 2 View Left  . XR Foot 2 Views Right  . XR Foot 2 Views Left  . QuantiFERON-TB Gold Plus   No orders of the defined types were placed in this encounter.    Follow-Up Instructions: Return in about 3 months (around 05/26/2020) for Rheumatoid arthritis, DDD.   Bo Merino, MD  Note - This record has been created using Editor, commissioning.  Chart creation errors have been sought, but may not always  have been located. Such creation errors do not reflect on  the standard of medical care.

## 2020-02-15 ENCOUNTER — Other Ambulatory Visit: Payer: Self-pay

## 2020-02-17 ENCOUNTER — Encounter: Payer: Self-pay | Admitting: Gastroenterology

## 2020-02-17 ENCOUNTER — Ambulatory Visit (INDEPENDENT_AMBULATORY_CARE_PROVIDER_SITE_OTHER): Payer: BC Managed Care – PPO | Admitting: Family Medicine

## 2020-02-17 ENCOUNTER — Other Ambulatory Visit: Payer: Self-pay

## 2020-02-17 ENCOUNTER — Encounter: Payer: Self-pay | Admitting: Family Medicine

## 2020-02-17 VITALS — BP 138/76 | HR 75 | Temp 97.7°F | Ht 70.0 in | Wt 190.4 lb

## 2020-02-17 DIAGNOSIS — I10 Essential (primary) hypertension: Secondary | ICD-10-CM | POA: Diagnosis not present

## 2020-02-17 DIAGNOSIS — M25552 Pain in left hip: Secondary | ICD-10-CM | POA: Diagnosis not present

## 2020-02-17 DIAGNOSIS — M545 Low back pain, unspecified: Secondary | ICD-10-CM

## 2020-02-17 DIAGNOSIS — Z0000 Encounter for general adult medical examination without abnormal findings: Secondary | ICD-10-CM | POA: Diagnosis not present

## 2020-02-17 DIAGNOSIS — Z1159 Encounter for screening for other viral diseases: Secondary | ICD-10-CM | POA: Diagnosis not present

## 2020-02-17 LAB — LIPID PANEL
Cholesterol: 188 mg/dL (ref 0–200)
HDL: 39.3 mg/dL (ref >39.00)
LDL Cholesterol: 122 mg/dL — ABNORMAL HIGH (ref 0–99)
NonHDL: 148.58
Total CHOL/HDL Ratio: 5
Triglycerides: 133 mg/dL (ref 0.0–149.0)
VLDL: 26.6 mg/dL (ref 0.0–40.0)

## 2020-02-17 LAB — COMPREHENSIVE METABOLIC PANEL
ALT: 26 U/L (ref 0–53)
AST: 16 U/L (ref 0–37)
Albumin: 4.2 g/dL (ref 3.5–5.2)
Alkaline Phosphatase: 74 U/L (ref 39–117)
BUN: 18 mg/dL (ref 6–23)
CO2: 28 mEq/L (ref 19–32)
Calcium: 9.6 mg/dL (ref 8.4–10.5)
Chloride: 105 mEq/L (ref 96–112)
Creatinine, Ser: 1.19 mg/dL (ref 0.40–1.50)
GFR: 62.13 mL/min (ref >60.00)
Glucose, Bld: 102 mg/dL — ABNORMAL HIGH (ref 70–99)
Potassium: 4.5 mEq/L (ref 3.5–5.1)
Sodium: 140 mEq/L (ref 135–145)
Total Bilirubin: 0.6 mg/dL (ref 0.2–1.2)
Total Protein: 6 g/dL (ref 6.0–8.3)

## 2020-02-17 LAB — MICROALBUMIN / CREATININE URINE RATIO
Creatinine,U: 124.7 mg/dL
Microalb Creat Ratio: 0.6 mg/g (ref 0.0–30.0)
Microalb, Ur: <0.7 mg/dL (ref 0.0–1.9)

## 2020-02-17 LAB — URINALYSIS, ROUTINE W REFLEX MICROSCOPIC
Bilirubin Urine: NEGATIVE
Hgb urine dipstick: NEGATIVE
Ketones, ur: NEGATIVE
Leukocytes,Ua: NEGATIVE
Nitrite: NEGATIVE
RBC / HPF: NONE SEEN (ref 0–?)
Specific Gravity, Urine: 1.015 (ref 1.000–1.030)
Total Protein, Urine: NEGATIVE
Urine Glucose: NEGATIVE
Urobilinogen, UA: 0.2 (ref 0.0–1.0)
pH: 7.5 (ref 5.0–8.0)

## 2020-02-17 LAB — CBC
HCT: 43.6 % (ref 39.0–52.0)
Hemoglobin: 15.1 g/dL (ref 13.0–17.0)
MCHC: 34.6 g/dL (ref 30.0–36.0)
MCV: 90.7 fl (ref 78.0–100.0)
Platelets: 248 10*3/uL (ref 150.0–400.0)
RBC: 4.81 Mil/uL (ref 4.22–5.81)
RDW: 14.1 % (ref 11.5–15.5)
WBC: 8.3 10*3/uL (ref 4.0–10.5)

## 2020-02-17 LAB — PSA: PSA: 1.66 ng/mL (ref 0.10–4.00)

## 2020-02-17 LAB — LDL CHOLESTEROL, DIRECT: Direct LDL: 124 mg/dL

## 2020-02-17 NOTE — Patient Instructions (Signed)
Health Maintenance, Male Adopting a healthy lifestyle and getting preventive care are important in promoting health and wellness. Ask your health care provider about:  The right schedule for you to have regular tests and exams.  Things you can do on your own to prevent diseases and keep yourself healthy. What should I know about diet, weight, and exercise? Eat a healthy diet   Eat a diet that includes plenty of vegetables, fruits, low-fat dairy products, and lean protein.  Do not eat a lot of foods that are high in solid fats, added sugars, or sodium. Maintain a healthy weight Body mass index (BMI) is a measurement that can be used to identify possible weight problems. It estimates body fat based on height and weight. Your health care provider can help determine your BMI and help you achieve or maintain a healthy weight. Get regular exercise Get regular exercise. This is one of the most important things you can do for your health. Most adults should:  Exercise for at least 150 minutes each week. The exercise should increase your heart rate and make you sweat (moderate-intensity exercise).  Do strengthening exercises at least twice a week. This is in addition to the moderate-intensity exercise.  Spend less time sitting. Even light physical activity can be beneficial. Watch cholesterol and blood lipids Have your blood tested for lipids and cholesterol at 61 years of age, then have this test every 5 years. You may need to have your cholesterol levels checked more often if:  Your lipid or cholesterol levels are high.  You are older than 61 years of age.  You are at high risk for heart disease. What should I know about cancer screening? Many types of cancers can be detected early and may often be prevented. Depending on your health history and family history, you may need to have cancer screening at various ages. This may include screening for:  Colorectal cancer.  Prostate  cancer.  Skin cancer.  Lung cancer. What should I know about heart disease, diabetes, and high blood pressure? Blood pressure and heart disease  High blood pressure causes heart disease and increases the risk of stroke. This is more likely to develop in people who have high blood pressure readings, are of African descent, or are overweight.  Talk with your health care provider about your target blood pressure readings.  Have your blood pressure checked: ? Every 3-5 years if you are 61-39 years of age. ? Every year if you are 40 years old or older.  If you are between the ages of 61 and 75 and are a current or former smoker, ask your health care provider if you should have a one-time screening for abdominal aortic aneurysm (AAA). Diabetes Have regular diabetes screenings. This checks your fasting blood sugar level. Have the screening done:  Once every three years after age 45 if you are at a normal weight and have a low risk for diabetes.  More often and at a younger age if you are overweight or have a high risk for diabetes. What should I know about preventing infection? Hepatitis B If you have a higher risk for hepatitis B, you should be screened for this virus. Talk with your health care provider to find out if you are at risk for hepatitis B infection. Hepatitis C Blood testing is recommended for:  Everyone born from 1945 through 1965.  Anyone with known risk factors for hepatitis C. Sexually transmitted infections (STIs)  You should be screened each year   for STIs, including gonorrhea and chlamydia, if: ? You are sexually active and are younger than 61 years of age. ? You are older than 61 years of age and your health care provider tells you that you are at risk for this type of infection. ? Your sexual activity has changed since you were last screened, and you are at increased risk for chlamydia or gonorrhea. Ask your health care provider if you are at risk.  Ask your  health care provider about whether you are at high risk for HIV. Your health care provider may recommend a prescription medicine to help prevent HIV infection. If you choose to take medicine to prevent HIV, you should first get tested for HIV. You should then be tested every 3 months for as long as you are taking the medicine. Follow these instructions at home: Lifestyle  Do not use any products that contain nicotine or tobacco, such as cigarettes, e-cigarettes, and chewing tobacco. If you need help quitting, ask your health care provider.  Do not use street drugs.  Do not share needles.  Ask your health care provider for help if you need support or information about quitting drugs. Alcohol use  Do not drink alcohol if your health care provider tells you not to drink.  If you drink alcohol: ? Limit how much you have to 0-2 drinks a day. ? Be aware of how much alcohol is in your drink. In the [place] one drink equals one 12 oz bottle of beer (355 mL), one 5 oz glass of wine (148 mL), or one 1 oz glass of hard liquor (44 mL). General instructions  Schedule regular health, dental, and eye exams.  Stay current with your vaccines.  Tell your health care provider if: ? You often feel depressed. ? You have ever been abused or do not feel safe at home. Summary  Adopting a healthy lifestyle and getting preventive care are important in promoting health and wellness.  Follow your health care provider's instructions about healthy diet, exercising, and getting tested or screened for diseases.  Follow your health care provider's instructions on monitoring your cholesterol and blood pressure. This information is not intended to replace advice given to you by your health care provider. Make sure you discuss any questions you have with your health care provider. Document Revised: 08/05/2018 Document Reviewed: 08/05/2018 Elsevier Patient Education  2020 [employer] Inc[employer]  Preventive Care 40-64 Years  Old, Male Preventive care refers to lifestyle choices and visits with your health care provider that can promote health and wellness. This includes:  A yearly physical exam. This is also called an annual well check.  Regular dental and eye exams.  Immunizations.  Screening for certain conditions.  Healthy lifestyle choices, such as eating a healthy diet, getting regular exercise, not using drugs or products that contain nicotine and tobacco, and limiting alcohol use. What can I expect for my preventive care visit? Physical exam Your health care provider will check:  Height and weight. These may be used to calculate body mass index (BMI), which is a measurement that tells if you are at a healthy weight.  Heart rate and blood pressure.  Your skin for abnormal spots. Counseling Your health care provider may ask you questions about:  Alcohol, tobacco, and drug use.  Emotional well-being.  Home and relationship well-being.  Sexual activity.  Eating habits.  Work and work environment. What immunizations do I need?  Influenza (flu) vaccine  This is recommended every year. Tetanus, diphtheria,   and pertussis (Tdap) vaccine  You may need a Td booster every 10 years. Varicella (chickenpox) vaccine  You may need this vaccine if you have not already been vaccinated. Zoster (shingles) vaccine  You may need this after age 63. Measles, mumps, and rubella (MMR) vaccine  You may need at least one dose of MMR if you were born in 1957 or later. You may also need a second dose. Pneumococcal conjugate (PCV13) vaccine  You may need this if you have certain conditions and were not previously vaccinated. Pneumococcal polysaccharide (PPSV23) vaccine  You may need one or two doses if you smoke cigarettes or if you have certain conditions. Meningococcal conjugate (MenACWY) vaccine  You may need this if you have certain conditions. Hepatitis A vaccine  You may need this if you have  certain conditions or if you travel or work in places where you may be exposed to hepatitis A. Hepatitis B vaccine  You may need this if you have certain conditions or if you travel or work in places where you may be exposed to hepatitis B. Haemophilus influenzae type b (Hib) vaccine  You may need this if you have certain risk factors. Human papillomavirus (HPV) vaccine  If recommended by your health care provider, you may need three doses over 6 months. You may receive vaccines as individual doses or as more than one vaccine together in one shot (combination vaccines). Talk with your health care provider about the risks and benefits of combination vaccines. What tests do I need? Blood tests  Lipid and cholesterol levels. These may be checked every 5 years, or more frequently if you are over 68 years old.  Hepatitis C test.  Hepatitis B test. Screening  Lung cancer screening. You may have this screening every year starting at age 78 if you have a 30-pack-year history of smoking and currently smoke or have quit within the past 15 years.  Prostate cancer screening. Recommendations will vary depending on your family history and other risks.  Colorectal cancer screening. All adults should have this screening starting at age 38 and continuing until age 22. Your health care provider may recommend screening at age 73 if you are at increased risk. You will have tests every 1-10 years, depending on your results and the type of screening test.  Diabetes screening. This is done by checking your blood sugar (glucose) after you have not eaten for a while (fasting). You may have this done every 1-3 years.  Sexually transmitted disease (STD) testing. Follow these instructions at home: Eating and drinking  Eat a diet that includes fresh fruits and vegetables, whole grains, lean protein, and low-fat dairy products.  Take vitamin and mineral supplements as recommended by your health care  provider.  Do not drink alcohol if your health care provider tells you not to drink.  If you drink alcohol: ? Limit how much you have to 0-2 drinks a day. ? Be aware of how much alcohol is in your drink. In the [place] one drink equals one 12 oz bottle of beer (355 mL), one 5 oz glass of wine (148 mL), or one 1 oz glass of hard liquor (44 mL). Lifestyle  Take daily care of your teeth and gums.  Stay active. Exercise for at least 30 minutes on 5 or more days each week.  Do not use any products that contain nicotine or tobacco, such as cigarettes, e-cigarettes, and chewing tobacco. If you need help quitting, ask your health care provider.  If  you are sexually active, practice safe sex. Use a condom or other form of protection to prevent STIs (sexually transmitted infections).  Talk with your health care provider about taking a low-dose aspirin every day starting at age 50. What's next?  Go to your health care provider once a year for a well check visit.  Ask your health care provider how often you should have your eyes and teeth checked.  Stay up to date on all vaccines. This information is not intended to replace advice given to you by your health care provider. Make sure you discuss any questions you have with your health care provider. Document Revised: 08/06/2018 Document Reviewed: 08/06/2018 Elsevier Patient Education  2020 Elsevier Inc.  

## 2020-02-17 NOTE — Progress Notes (Signed)
Established Patient Office Visit  Subjective:  Patient ID: Benjamin Fields, male    DOB: July 13, 1961  Age: 59 y.o. MRN: [id number]  CC:  Chief Complaint  Patient presents with  . Annual Exam    CPE, pt fasting would like referral for colonoscopy    HPI Benjamin Fields presents for follow-up of his blood pressure and a complete physical exam.  He has been having issues with lower back pain.  He is scheduled to see [facility] this coming week.  Blood pressure is controlled with low-dose losartan.  Referred to colonoscopy last visit but never heard.  He has been dealing with intestinal bloating.  Cannot necessarily associated with foods but he is suffering from some constipation.  Has not seen any blood in his stool.  There is been no weight loss.  Regular dental care and is scheduled for implants.  He has not started back on his methotrexate for his rheumatoid factor negative RA.  Continues to have paresthesias in his lower extremities.  History of neuropathy.  TSH was 1.03 2019.  Past Medical History:  Diagnosis Date  . Hypertension   . RA (rheumatoid arthritis) ([facility])     Past Surgical History:  Procedure Laterality Date  . CERVICAL SPINE SURGERY  2010  . TONSILLECTOMY      Family History  Problem Relation Age of Onset  . Parkinson's disease Mother   . Prostate cancer Mother   . Atrial fibrillation Mother   . Heart failure Father   . Hypertension Father     Social History   Socioeconomic History  . Marital status: Married    Spouse name: Not on file  . Number of children: Not on file  . Years of education: Not on file  . Highest education level: Not on file  Occupational History  . Not on file  Tobacco Use  . Smoking status: Never Smoker  . Smokeless tobacco: Never Used  Vaping Use  . Vaping Use: Never used  Substance and Sexual Activity  . Alcohol use: Never  . Drug use: Never  . Sexual activity: Not on file  Other Topics Concern  . Not on file    Social History Narrative   Lives home with wife.  workd at [employer]  Education: Masters.  Children 2.  Caffeine 2 cups daily   Social Determinants of Health   Financial Resource Strain:   . Difficulty of Paying Living Expenses:   Food Insecurity:   . Worried About Charity fundraiser in the Last Year:   . Arboriculturist in the Last Year:   Transportation Needs:   . Film/video editor (Medical):   Marland Kitchen Lack of Transportation (Non-Medical):   Physical Activity:   . Days of Exercise per Week:   . Minutes of Exercise per Session:   Stress:   . Feeling of Stress :   Social Connections:   . Frequency of Communication with Friends and Family:   . Frequency of Social Gatherings with Friends and Family:   . Attends Religious Services:   . Active Member of Clubs or Organizations:   . Attends Archivist Meetings:   Marland Kitchen Marital Status:   Intimate Partner Violence:   . Fear of Current or Ex-Partner:   . Emotionally Abused:   Marland Kitchen Physically Abused:   . Sexually Abused:     Outpatient Medications Prior to Visit  Medication Sig Dispense Refill  . DULoxetine (CYMBALTA) 60 MG capsule Take  1 capsule by mouth once daily 30 capsule 0  . gabapentin (NEURONTIN) 100 MG capsule Take 1 capsule in am, 1 capsule at lunch and 3 capsules at bedtime. 360 capsule 3  . losartan (COZAAR) 25 MG tablet Take 25 mg by mouth daily. for high blood pressure  1  . Multiple Vitamin (MULTIVITAMIN) tablet Take 1 tablet by mouth daily. OTC    . OTREXUP 20 MG/0.4ML SOAJ INJECT 20 MG INTO THE SKIN ONCE A WEEK AS DIRECTED 12 pen 0   No facility-administered medications prior to visit.    Allergies  Allergen Reactions  . Cortisone     FLARE    ROS Review of Systems  Constitutional: Negative.   HENT: Negative.   Eyes: Negative for photophobia and visual disturbance.  Respiratory: Negative.   Cardiovascular: Negative.   Gastrointestinal: Negative.   Endocrine: Negative for polyphagia and polyuria.   Genitourinary: Negative for difficulty urinating, frequency and urgency.  Musculoskeletal: Positive for arthralgias and back pain.  Skin: Negative for pallor and rash.  Allergic/Immunologic: Negative for immunocompromised state.  Neurological: Positive for numbness. Negative for weakness.  Hematological: Does not bruise/bleed easily.   Depression screen [facility] 2/9 02/17/2020 02/17/2020 11/16/2019  Decreased Interest 1 0 1  Down, Depressed, Hopeless 0 0 1  PHQ - 2 Score 1 0 2  Altered sleeping 0 - 1  Tired, decreased energy 2 - 1  Change in appetite 0 - 1  Feeling bad or failure about yourself  0 - 1  Trouble concentrating 1 - 1  Moving slowly or fidgety/restless 1 - 1  Suicidal thoughts 0 - 0  PHQ-9 Score 5 - 8  Difficult doing work/chores Somewhat difficult - Somewhat difficult      Objective:    Physical Exam Constitutional:      General: He is not in acute distress.    Appearance: Normal appearance. He is normal weight. He is not ill-appearing, toxic-appearing or diaphoretic.  HENT:     Head: Normocephalic and atraumatic.     Right Ear: Tympanic membrane, ear canal and external ear normal.     Left Ear: Tympanic membrane, ear canal and external ear normal.     Nose: Nose normal.     Mouth/Throat:     Mouth: Mucous membranes are moist.     Dentition: Abnormal dentition.     Pharynx: Oropharynx is clear. No oropharyngeal exudate or posterior oropharyngeal erythema.  Eyes:     General: No scleral icterus.       Right eye: No discharge.        Left eye: No discharge.     Extraocular Movements: Extraocular movements intact.     Conjunctiva/sclera: Conjunctivae normal.     Pupils: Pupils are equal, round, and reactive to light.  Cardiovascular:     Rate and Rhythm: Normal rate and regular rhythm.  Pulmonary:     Effort: Pulmonary effort is normal.     Breath sounds: Normal breath sounds.  Abdominal:     General: Abdomen is flat. Bowel sounds are normal. There is no  distension.     Palpations: Abdomen is soft. There is no mass.     Tenderness: There is no abdominal tenderness. There is no guarding or rebound.     Hernia: No hernia is present.  Genitourinary:    Prostate: Not enlarged, not tender and no nodules present.     Rectum: Guaiac result negative. No mass, tenderness, anal fissure, external hemorrhoid or internal hemorrhoid. Normal anal tone.  Musculoskeletal:     Cervical back: No rigidity or tenderness.     Lumbar back: No swelling, deformity, lacerations, spasms or tenderness. Decreased range of motion. Negative right straight leg raise test.     Right hip: Normal range of motion.     Left hip: Normal range of motion.     Right lower leg: No edema.     Left lower leg: No edema.       Legs:  Lymphadenopathy:     Cervical: No cervical adenopathy.  Skin:    General: Skin is warm and dry.  Neurological:     Mental Status: He is alert and oriented to person, place, and time.  Psychiatric:        Mood and Affect: Mood normal.        Behavior: Behavior normal.     BP 138/76   Pulse 75   Temp 97.7 F (36.5 C) (Tympanic)   Ht 5\' 10"  (1.778 m)   Wt 190 lb 6.4 oz (86.4 kg)   SpO2 96%   BMI 27.32 kg/m  Wt Readings from Last 3 Encounters:  02/17/20 190 lb 6.4 oz (86.4 kg)  12/29/19 193 lb (87.5 kg)  11/16/19 186 lb (84.4 kg)     Health Maintenance Due  Topic Date Due  . COLONOSCOPY  Never done    There are no preventive care reminders to display for this patient.  Lab Results  Component Value Date   TSH 1.03 03/17/2018   Lab Results  Component Value Date   WBC 7.3 10/22/2019   HGB 15.4 10/22/2019   HCT 43.6 10/22/2019   MCV 94.6 10/22/2019   PLT 211 10/22/2019   Lab Results  Component Value Date   NA 139 10/22/2019   K 4.1 10/22/2019   CO2 25 10/22/2019   GLUCOSE 89 10/22/2019   BUN 20 10/22/2019   CREATININE 1.21 10/22/2019   BILITOT 0.9 10/22/2019   AST 21 10/22/2019   ALT 40 10/22/2019   PROT 5.9 (L)  10/22/2019   CALCIUM 10.0 10/22/2019   Lab Results  Component Value Date   CHOL 209 (H) 10/22/2019   Lab Results  Component Value Date   HDL 43 10/22/2019   Lab Results  Component Value Date   LDLCALC 130 (H) 10/22/2019   Lab Results  Component Value Date   TRIG 217 (H) 10/22/2019   Lab Results  Component Value Date   CHOLHDL 4.9 10/22/2019   No results found for: HGBA1C    Assessment & Plan:   Problem List Items Addressed This Visit      Cardiovascular and Mediastinum   Essential hypertension - Primary   Relevant Orders   CBC   Comprehensive metabolic panel   Urinalysis, Routine w reflex microscopic   Microalbumin / creatinine urine ratio     Other   Healthcare maintenance   Relevant Orders   CBC   Comprehensive metabolic panel   LDL cholesterol, direct   Lipid panel   PSA   Urinalysis, Routine w reflex microscopic   Ambulatory referral to Gastroenterology   Low back pain   Left hip pain      No orders of the defined types were placed in this encounter.   Follow-up: Return in about 6 months (around 08/18/2020).  Given information on health maintenance and disease prevention.  Follow-up with [facility] for lower back and hip pain. Libby Maw, MD

## 2020-02-18 ENCOUNTER — Telehealth: Payer: Self-pay | Admitting: Family Medicine

## 2020-02-18 LAB — HEPATITIS C ANTIBODY
Hepatitis C Ab: NONREACTIVE
SIGNAL TO CUT-OFF: 0 (ref <1.00)

## 2020-02-18 NOTE — Telephone Encounter (Signed)
Patient returned call to the office regarding his lab results. Please give him a call back.   Thank you, Burley Saver

## 2020-02-22 ENCOUNTER — Ambulatory Visit: Payer: BC Managed Care – PPO | Admitting: Orthopaedic Surgery

## 2020-02-22 ENCOUNTER — Encounter: Payer: Self-pay | Admitting: Orthopaedic Surgery

## 2020-02-22 ENCOUNTER — Ambulatory Visit: Payer: Self-pay

## 2020-02-22 VITALS — Ht 70.0 in | Wt 190.0 lb

## 2020-02-22 DIAGNOSIS — M545 Low back pain, unspecified: Secondary | ICD-10-CM

## 2020-02-22 DIAGNOSIS — M25552 Pain in left hip: Secondary | ICD-10-CM

## 2020-02-22 NOTE — Progress Notes (Signed)
Office Visit Note   Patient: Benjamin Fields           Date of Birth: [birth year]           MRN: [id number] Visit Date: 02/22/2020              Requested by: Libby Maw, MD [address] [place],  [place] [address] PCP: Libby Maw, MD   Assessment & Plan: Visit Diagnoses:  1. Acute left-sided low back pain, unspecified whether sciatica present   2. Pain in left hip     Plan: Patient may have some left hip synovitis.  X-rays showed just minimal arthritic changes.  We will set him up for an injection of his left hip under fluoroscopy with Dr. Ernestina Patches and I will follow-up after injection in 3 wks  Follow-Up Instructions: No follow-ups on file.   Orders:  Orders Placed This Encounter  Procedures  . XR Lumbar Spine 2-3 Views  . XR HIP UNILAT W OR W/O PELVIS 2-3 VIEWS LEFT   No orders of the defined types were placed in this encounter.     Procedures: No procedures performed   Clinical Data: No additional findings.   Subjective: Chief Complaint  Patient presents with  . Lower Back - Pain    HPI 59 year old male has not been seen in 12 years since he had his cervical spine surgery.  He states he has polyneuropathy and was also diagnosed with rheumatoid arthritis.  Has had back pain pain that radiates in his  left groin and most of the time in his anterior thigh down to his knee.  No numbness or tingling below the knee.  Occasionally has had some numbness in his right foot which she relates to his neuropathy.  He is used Aleve and tramadol as needed.  Pain is worse when he is walking around he has stiffness in the morning when he first gets up.  Had one episode where he had go to the urgent care when he had extreme pain pain in his right hip has been amatory with a limp.  Review of Systems   Objective: Vital Signs: Ht 5\' 10"  (1.778 m)   Wt 190 lb (86.2 kg)   BMI 27.26 kg/m   Physical Exam Constitutional:      Appearance: He is  well-developed.  HENT:     Head: Normocephalic and atraumatic.  Eyes:     Pupils: Pupils are equal, round, and reactive to light.  Neck:     Thyroid: No thyromegaly.     Trachea: No tracheal deviation.  Cardiovascular:     Rate and Rhythm: Normal rate.  Pulmonary:     Effort: Pulmonary effort is normal.     Breath sounds: No wheezing.  Abdominal:     General: Bowel sounds are normal.     Palpations: Abdomen is soft.  Skin:    General: Skin is warm and dry.     Capillary Refill: Capillary refill takes less than 2 seconds.  Neurological:     Mental Status: He is alert and oriented to person, place, and time.  Psychiatric:        Behavior: Behavior normal.        Thought Content: Thought content normal.        Judgment: Judgment normal.     Ortho Exam patient has negative logroll to the right hip.  Internal rotation left hip 20 degrees reproduces his anterior left groin pain.  Minimal trochanteric bursal  tenderness.  Negative straight leg raising 90 degrees.  Anterior tib gastrocsoleus heel toe walking is intact he is amatory with the left hip limp.  Specialty Comments:  No specialty comments available.  Imaging: No results found.   PMFS History: Patient Active Problem List   Diagnosis Date Noted  . Low back pain 02/17/2020  . Left hip pain 02/17/2020  . Healthcare maintenance 11/16/2019  . Rheumatoid arthritis of multiple sites with negative rheumatoid factor ([place]) 05/19/2018  . Tinea versicolor 05/19/2018  . Essential hypertension 03/17/2018  . DDD (degenerative disc disease), cervical 03/16/2018   Past Medical History:  Diagnosis Date  . Hypertension   . RA (rheumatoid arthritis) ([facility])     Family History  Problem Relation Age of Onset  . Parkinson's disease Mother   . Prostate cancer Mother   . Atrial fibrillation Mother   . Heart failure Father   . Hypertension Father     Past Surgical History:  Procedure Laterality Date  . CERVICAL SPINE SURGERY  2010    . TONSILLECTOMY     Social History   Occupational History  . Not on file  Tobacco Use  . Smoking status: Never Smoker  . Smokeless tobacco: Never Used  Vaping Use  . Vaping Use: Never used  Substance and Sexual Activity  . Alcohol use: Never  . Drug use: Never  . Sexual activity: Not on file

## 2020-02-23 ENCOUNTER — Telehealth: Payer: Self-pay | Admitting: Physical Medicine and Rehabilitation

## 2020-02-23 ENCOUNTER — Telehealth: Payer: Self-pay | Admitting: Rheumatology

## 2020-02-23 NOTE — Telephone Encounter (Signed)
Scheduled

## 2020-02-23 NOTE — Telephone Encounter (Signed)
Referral for left hip injection - Dr. Lorin Mercy.

## 2020-02-23 NOTE — Telephone Encounter (Signed)
Pt states he missed a call and would like to still receive services.   [phone]

## 2020-02-23 NOTE — Telephone Encounter (Signed)
Per Dr. Estanislado Pandy patient may keep his appointment for 02/24/2020.   Attempted to contact the patient and no answer, unable to leave a message.

## 2020-02-23 NOTE — Telephone Encounter (Signed)
Patient called stating he is scheduled to see Dr. Estanislado Pandy tomorrow 02/24/20.  Patient is requesting a return call to let him know if he should wait to see Dr. Estanislado Pandy until he has the test with Dr. Ernestina Patches.

## 2020-02-23 NOTE — Telephone Encounter (Signed)
Patient advised he may keep his appointment for 02/24/2020.

## 2020-02-24 ENCOUNTER — Encounter: Payer: Self-pay | Admitting: Orthopaedic Surgery

## 2020-02-24 ENCOUNTER — Ambulatory Visit: Payer: Self-pay

## 2020-02-24 ENCOUNTER — Ambulatory Visit: Payer: BC Managed Care – PPO | Admitting: Rheumatology

## 2020-02-24 ENCOUNTER — Other Ambulatory Visit: Payer: Self-pay

## 2020-02-24 ENCOUNTER — Encounter: Payer: Self-pay | Admitting: Rheumatology

## 2020-02-24 VITALS — BP 143/87 | HR 96 | Resp 12 | Ht 70.0 in | Wt 194.6 lb

## 2020-02-24 DIAGNOSIS — M25411 Effusion, right shoulder: Secondary | ICD-10-CM

## 2020-02-24 DIAGNOSIS — Z111 Encounter for screening for respiratory tuberculosis: Secondary | ICD-10-CM | POA: Diagnosis not present

## 2020-02-24 DIAGNOSIS — M79671 Pain in right foot: Secondary | ICD-10-CM

## 2020-02-24 DIAGNOSIS — M79641 Pain in right hand: Secondary | ICD-10-CM | POA: Diagnosis not present

## 2020-02-24 DIAGNOSIS — M0609 Rheumatoid arthritis without rheumatoid factor, multiple sites: Secondary | ICD-10-CM

## 2020-02-24 DIAGNOSIS — R202 Paresthesia of skin: Secondary | ICD-10-CM

## 2020-02-24 DIAGNOSIS — I10 Essential (primary) hypertension: Secondary | ICD-10-CM

## 2020-02-24 DIAGNOSIS — M79642 Pain in left hand: Secondary | ICD-10-CM | POA: Diagnosis not present

## 2020-02-24 DIAGNOSIS — M5030 Other cervical disc degeneration, unspecified cervical region: Secondary | ICD-10-CM

## 2020-02-24 DIAGNOSIS — R5383 Other fatigue: Secondary | ICD-10-CM

## 2020-02-24 DIAGNOSIS — M25552 Pain in left hip: Secondary | ICD-10-CM

## 2020-02-24 DIAGNOSIS — M25462 Effusion, left knee: Secondary | ICD-10-CM | POA: Diagnosis not present

## 2020-02-24 DIAGNOSIS — B360 Pityriasis versicolor: Secondary | ICD-10-CM

## 2020-02-24 DIAGNOSIS — M79672 Pain in left foot: Secondary | ICD-10-CM | POA: Diagnosis not present

## 2020-02-24 DIAGNOSIS — Z79899 Other long term (current) drug therapy: Secondary | ICD-10-CM

## 2020-02-24 DIAGNOSIS — R200 Anesthesia of skin: Secondary | ICD-10-CM

## 2020-02-24 NOTE — Patient Instructions (Signed)
Standing Labs We placed an order today for your standing lab work.   Please have your standing labs drawn in October and every 3 months  If possible, please have your labs drawn 2 weeks prior to your appointment so that the provider can discuss your results at your appointment.  We have open lab daily Monday through Thursday from 8:30-12:30 PM and 1:30-4:30 PM and Friday from 8:30-12:30 PM and 1:30-4:00 PM at the office of Dr. Prudie Guthridge, [facility].   Please be advised, patients with office appointments requiring lab work will take precedents over walk-in lab work.  If possible, please come for your lab work on Monday and Friday afternoons, as you may experience shorter wait times. The office is located at [address], [address], [place], [place] [address] No appointment is necessary.   Labs are drawn by [facility]. Please bring your co-pay at the time of your lab draw.  You may receive a bill from [facility] for your lab work.  If you wish to have your labs drawn at another location, please call the office 24 hours in advance to send orders.  If you have any questions regarding directions or hours of operation,  please call [phone].   As a reminder, please drink plenty of water prior to coming for your lab work. Thanks!  

## 2020-02-26 LAB — QUANTIFERON-TB GOLD PLUS
Mitogen-NIL: 9.03 IU/mL
NIL: 0.02 IU/mL
QuantiFERON-TB Gold Plus: NEGATIVE
TB1-NIL: 0 IU/mL
TB2-NIL: 0 IU/mL

## 2020-02-27 NOTE — Progress Notes (Signed)
TB gold is negative.

## 2020-03-01 ENCOUNTER — Encounter: Payer: Self-pay | Admitting: Rheumatology

## 2020-03-02 NOTE — Progress Notes (Signed)
Office Visit Note  Patient: Benjamin Fields             Date of Birth: 06-03-1961           MRN: [id number]             PCP: Libby Maw, MD Referring: Libby Maw,* Visit Date: 03/06/2020 Occupation: @GUAROCC @  Subjective:  Left sided rib cage pain   History of Present Illness: Benjamin Fields is a 59 y.o. male with history of seronegative rheumatoid arthritis.  He reports that he resumed taking Rinvoq 15 mg 1 tablet by mouth daily after his last office visit on 02/24/2020.  He has been tolerating Rinvoq without any side effects.  He has not noticed any improvement since resuming Rinvoq.  He reports he continues to have severe pain and inflammation in the right shoulder joint.  He experiences nocturnal pain and is not able to lay on his right side at night.  He reports left sided rib pain for the past 2-3 months.  He reports it is tender to the touch.  He denies any injury.  He denies any coughing, pleuritic chest pain, shortness of breath, fevers, or and swollen lymph nodes.  He has been taking tramadol as needed for pain relief and gabapentin and Cymbalta as prescribed.  He states he has noticed some improvement in his left hip joint pain but continues to have discomfort into his left groin.  Activities of Daily Living:  Patient reports morning stiffness for 1 hour.   Patient Reports nocturnal pain.  Difficulty dressing/grooming: Denies Difficulty climbing stairs: Denies Difficulty getting out of chair: Denies Difficulty using hands for taps, buttons, cutlery, and/or writing: Denies  Review of Systems  Constitutional: Positive for fatigue. Negative for night sweats.  HENT: Positive for mouth dryness. Negative for mouth sores and nose dryness.   Eyes: Positive for dryness. Negative for redness.  Respiratory: Negative for shortness of breath and difficulty breathing.   Cardiovascular: Positive for swelling in legs/feet. Negative for chest pain, palpitations,  hypertension and irregular heartbeat.  Gastrointestinal: Positive for constipation. Negative for diarrhea.  Musculoskeletal: Positive for arthralgias, joint pain, joint swelling, muscle weakness, morning stiffness and muscle tenderness. Negative for myalgias and myalgias.  Skin: Negative for color change, rash, hair loss, nodules/bumps, skin tightness, ulcers and sensitivity to sunlight.  Allergic/Immunologic: Negative for susceptible to infections.  Neurological: Positive for numbness and weakness. Negative for dizziness, fainting, memory loss and night sweats.  Hematological: Negative for bruising/bleeding tendency and swollen glands.  Psychiatric/Behavioral: Negative for depressed mood and sleep disturbance. The patient is not nervous/anxious.     PMFS History:  Patient Active Problem List   Diagnosis Date Noted   Low back pain 02/17/2020   Left hip pain 02/17/2020   Healthcare maintenance 11/16/2019   Rheumatoid arthritis of multiple sites with negative rheumatoid factor ([place]) 05/19/2018   Tinea versicolor 05/19/2018   Essential hypertension 03/17/2018   DDD (degenerative disc disease), cervical 03/16/2018    Past Medical History:  Diagnosis Date   Hypertension    RA (rheumatoid arthritis) ([place])     Family History  Problem Relation Age of Onset   Parkinson's disease Mother    Prostate cancer Mother    Atrial fibrillation Mother    Heart failure Father    Hypertension Father    Past Surgical History:  Procedure Laterality Date   CERVICAL SPINE SURGERY  2010   TONSILLECTOMY     Social History   Social History  Narrative   Lives home with wife.  workd at [employer]  Education: Masters.  Children 2.  Caffeine 2 cups daily   Immunization History  Administered Date(s) Administered   Influenza Whole 07/27/2019   Influenza,inj,Quad PF,6+ Mos 07/06/2016   PFIZER SARS-COV-2 Vaccination 11/25/2019, 12/20/2019   Tdap 08/27/2019     Objective: Vital  Signs: BP 122/76 (BP Location: Left Arm, Patient Position: Sitting, Cuff Size: Normal)    Pulse 76    Ht 5' 10"  (1.778 m)    Wt 192 lb 12.8 oz (87.5 kg)    BMI 27.66 kg/m    Physical Exam Vitals and nursing note reviewed.  Constitutional:      Appearance: He is well-developed.  HENT:     Head: Normocephalic and atraumatic.  Eyes:     Conjunctiva/sclera: Conjunctivae normal.     Pupils: Pupils are equal, round, and reactive to light.  Cardiovascular:     Rate and Rhythm: Normal rate and regular rhythm.  Pulmonary:     Effort: Pulmonary effort is normal.  Abdominal:     Palpations: Abdomen is soft.  Musculoskeletal:     Cervical back: Normal range of motion and neck supple.  Skin:    General: Skin is warm and dry.     Capillary Refill: Capillary refill takes less than 2 seconds.  Neurological:     Mental Status: He is alert and oriented to person, place, and time.  Psychiatric:        Behavior: Behavior normal.      Musculoskeletal Exam: C-spine, thoracic spine, and lumbar spine have good range of motion.  He has painful and limited range of motion of the right shoulder joint.  Limited abduction to about 70 degrees.  Palpable warmth and effusion of the right shoulder joint was noted.  Elbow joints have good range of motion with tenderness bilaterally.  Wrist joints have good range of motion with no tenderness or synovitis.  He has complete fist formation bilaterally.  No tenderness of MCP or PIP joints noted.  Left hip has slightly limited range of motion with discomfort.  Right hip has good range of motion with no discomfort.  Knee joints have good range of motion with no warmth or effusion.  He has a Baker's cyst behind the left knee joint.  Ankle joints have good range of motion with no tenderness or synovitis.  CDAI Exam: CDAI Score: 1  Patient Global: 5 mm; Provider Global: 5 mm Swollen: 0 ; Tender: 0  Joint Exam 03/06/2020   No joint exam has been documented for this visit     There is currently no information documented on the homunculus. Go to the Rheumatology activity and complete the homunculus joint exam.  Investigation: No additional findings.  Imaging: XR HIP UNILAT W OR W/O PELVIS 2-3 VIEWS LEFT  Result Date: 02/24/2020 Standing AP pelvis frog-leg left hip obtained and reviewed.  This shows minimal spurs best seen on frog-leg lateral on the femoral head.  Minimal joint space narrowing no subchondral cyst formation. Impression: Minimal left hip degenerative changes seen.  Negative for acute changes no evidence of AVN.  XR Foot 2 Views Left  Result Date: 02/24/2020 First MTP, PIP and DIP narrowing was noted.  None of the other MTP showed narrowing.  No intertarsal or tibiotalar joint space narrowing was noted.  No erosive changes were noted. Impression: These findings are consistent with osteoarthritis of the foot.  XR Foot 2 Views Right  Result Date: 02/24/2020 First  MTP, PIP and DIP narrowing was noted.  None of the other MTP showed narrowing.  No intertarsal or tibiotalar joint space narrowing was noted.  No erosive changes were noted. Impression: These findings are consistent with osteoarthritis of the foot.  XR Hand 2 View Left  Result Date: 02/24/2020 Juxta-articular osteopenia was noted.  PIP and DIP narrowing was noted.  CMC narrowing was noted.  No MCP, intercarpal radiocarpal joint space narrowing was noted.  No erosive changes were noted. Impression: These findings are consistent with rheumatoid arthritis and osteoarthritis overlap.  XR Hand 2 View Right  Result Date: 02/24/2020 Juxta-articular osteopenia was noted.  PIP and DIP narrowing was noted.  CMC narrowing was noted.  No MCP, intercarpal radiocarpal joint space narrowing was noted.  No erosive changes were noted. Impression: These findings are consistent with rheumatoid arthritis and osteoarthritis overlap.  XR Lumbar Spine 2-3 Views  Result Date: 02/24/2020 AP lateral lumbar spine x-rays  are obtained and reviewed.  This shows normal curvature no spondylolisthesis no significant facet degeneration. Impression: Normal lumbar spine images.   Recent Labs: Lab Results  Component Value Date   WBC 8.3 02/17/2020   HGB 15.1 02/17/2020   PLT 248.0 02/17/2020   NA 140 02/17/2020   K 4.5 02/17/2020   CL 105 02/17/2020   CO2 28 02/17/2020   GLUCOSE 102 (H) 02/17/2020   BUN 18 02/17/2020   CREATININE 1.19 02/17/2020   BILITOT 0.6 02/17/2020   ALKPHOS 74 02/17/2020   AST 16 02/17/2020   ALT 26 02/17/2020   PROT 6.0 02/17/2020   ALBUMIN 4.2 02/17/2020   CALCIUM 9.6 02/17/2020   GFRAA 75 10/22/2019   QFTBGOLDPLUS NEGATIVE 02/24/2020    Speciality Comments: Prior therapy: Enbrel and Humira (inadequate reponse)  Procedures:  No procedures performed Allergies: Cortisone   Assessment / Plan:     Visit Diagnoses: Rheumatoid arthritis of multiple sites with negative rheumatoid factor (HCC) - RF-, anti-CCP-, 14-3-3 eta-, ESR 2: - He has palpable effusion, warmth, and tenderness of the right shoulder joint on exam.  He has limited abduction to about 70 degrees and painful internal rotation of the right shoulder.  He has been experiencing severe nocturnal pain and is unable to lay on his right side at night.  He has started to experience left-sided rib pain for the past 2 to 3 months which is likely due to having to lay on his left side at night.  His discomfort is reproducible.  He has not had any pleuritic chest pain, shortness of breath, fevers, or enlarged lymph nodes.  He has noticed a mild improvement in his left hip joint discomfort.  He continues to have limited range of motion.  He restarted on Rinvoq 15 mg 1 tablet by mouth daily after his last office visit on 02/24/2020.  He has been tolerating Rinvoq without any side effects.  He previously was off of Rinvoq and Otrexup since January 2021 due to the concern for COVID-19.  He does not want to restart on OTREXUP at this time.  He  will continue taking Rinvoq as prescribed.  He was advised to notify us if his joint pain and inflammation does not improve on monotherapy.  He will follow-up in the office in 5 months.   High risk medication use - Rinvoq 15 mg 1 tablet daily started in October 2020-d/c Jan 2021-Restarted July 2021, Otrexup 20 mg every 7 days-d/c Jan 2021 (inadequate response to Enbrel, Humira, Actemra) -CBC and CMP were drawn on 02/17/2020.  TB gold was negative on 02/24/2020.  He will be due to update lab work in September and every 3 months to monitor for drug toxicity.  Effusion of right shoulder: He has a small effusion of the right shoulder joint on exam.  Warmth and tenderness was noted.  He has painful abduction to about 70 degrees.  He has been experiencing nocturnal pain and is unable to lay on his right side.  He restart on Rinvoq 15 mg 1 tablet by mouth daily on 02/24/2020 but has not noticed much improvement in his symptoms.  Rib pain on left side: He has been experiencing tenderness on the left side of his rib cage for the past 2 to 3 months.  He has not had any recent injuries or falls.  He has not had any recent upper respiratory tract infections.  His discomfort is reproducible with palpation.  He has not had any shortness of breath, pleuritic chest pain, cough or fevers recently.  No enlarged lymph nodes were palpable.  He has good range of motion of the left shoulder joint with no discomfort.  We discussed using Voltaren gel topically as needed.  He has been lying on his left side at night due to the severe pain in his right shoulder which is likely exacerbating his left-sided rib pain.  He was advised to notify us if his symptoms persist or worsen.  Effusion of left knee: He has mild inflammation of the left knee joint on exam.  Baker's cyst palpable behind the left knee.  DDD (degenerative disc disease), cervical: He has good range of motion with no discomfort at this time.  He has no symptoms of  radiculopathy.  Numbness and tingling of both feet: He is taking gabapentin as prescribed.  Other medical conditions are listed as follows:  Tinea versicolor  Other fatigue  Essential hypertension  Orders: No orders of the defined types were placed in this encounter.  No orders of the defined types were placed in this encounter.    Follow-Up Instructions: Return in about 5 months (around 08/06/2020) for Rheumatoid arthritis, DDD.   Hazel Sams, PA-C  I examined and evaluated the patient with Hazel Sams PA.  Patient is feeling better since he started removal.  He still had inflammation in his right shoulder.  He has been sleeping on his left side.  He was having some reproducible pain over the left rib cage.  He denies any shortness of breath or palpitations.  On my examination  his lungs were clear to auscultation.  I believe the pain is muscular.  Thigh have advised him to use topical diclofenac gel.  Heat application was also discussed.  He has been advised to contact us in case his symptoms get worse.  The plan of care was discussed as noted above.  Bo Merino, MD  Note - This record has been created using Editor, commissioning.  Chart creation errors have been sought, but may not always  have been located. Such creation errors do not reflect on  the standard of medical care.

## 2020-03-02 NOTE — Telephone Encounter (Signed)
Please call the patient to clarify if he is experiencing any additional symptoms (cough, SOB, pleuritic chest pain, fever, or muscle spasms).  Does anything make the pain better or worse?

## 2020-03-02 NOTE — Telephone Encounter (Signed)
Please schedule an appointment on Monday morning for further evaluation.

## 2020-03-06 ENCOUNTER — Ambulatory Visit: Payer: BC Managed Care – PPO | Admitting: Rheumatology

## 2020-03-06 ENCOUNTER — Other Ambulatory Visit: Payer: Self-pay

## 2020-03-06 ENCOUNTER — Encounter: Payer: Self-pay | Admitting: Physician Assistant

## 2020-03-06 VITALS — BP 122/76 | HR 76 | Ht 70.0 in | Wt 192.8 lb

## 2020-03-06 DIAGNOSIS — I10 Essential (primary) hypertension: Secondary | ICD-10-CM

## 2020-03-06 DIAGNOSIS — M0609 Rheumatoid arthritis without rheumatoid factor, multiple sites: Secondary | ICD-10-CM | POA: Diagnosis not present

## 2020-03-06 DIAGNOSIS — Z79899 Other long term (current) drug therapy: Secondary | ICD-10-CM

## 2020-03-06 DIAGNOSIS — R5383 Other fatigue: Secondary | ICD-10-CM

## 2020-03-06 DIAGNOSIS — R202 Paresthesia of skin: Secondary | ICD-10-CM

## 2020-03-06 DIAGNOSIS — M25411 Effusion, right shoulder: Secondary | ICD-10-CM | POA: Diagnosis not present

## 2020-03-06 DIAGNOSIS — R200 Anesthesia of skin: Secondary | ICD-10-CM

## 2020-03-06 DIAGNOSIS — B360 Pityriasis versicolor: Secondary | ICD-10-CM

## 2020-03-06 DIAGNOSIS — R0781 Pleurodynia: Secondary | ICD-10-CM

## 2020-03-06 DIAGNOSIS — M5030 Other cervical disc degeneration, unspecified cervical region: Secondary | ICD-10-CM

## 2020-03-06 DIAGNOSIS — M25462 Effusion, left knee: Secondary | ICD-10-CM

## 2020-03-08 ENCOUNTER — Ambulatory Visit: Payer: BC Managed Care – PPO | Admitting: Physical Medicine and Rehabilitation

## 2020-03-12 ENCOUNTER — Other Ambulatory Visit: Payer: Self-pay | Admitting: Rheumatology

## 2020-03-13 ENCOUNTER — Encounter: Payer: Self-pay | Admitting: Rheumatology

## 2020-03-13 NOTE — Telephone Encounter (Signed)
Last Visit: 03/06/2020 Next Visit: 07/27/2020  Okay to refill per Dr. Estanislado Pandy

## 2020-03-14 ENCOUNTER — Ambulatory Visit: Payer: BC Managed Care – PPO | Admitting: Orthopaedic Surgery

## 2020-03-14 MED ORDER — RINVOQ 15 MG PO TB24: 15.0000 mg | ORAL_TABLET | Freq: Every day | ORAL | 2 refills | Status: DC

## 2020-03-14 NOTE — Telephone Encounter (Signed)
I returned patient's call and discussed that it will take approximately 3 months to see full benefit from [place].  Fatigue is most likely related to chronic inflammation.  If he has no improvement over the next few days then he should call us.

## 2020-03-14 NOTE — Telephone Encounter (Signed)
Last Visit: 03/06/2020 Next Visit: 07/27/2020 Labs: 02/17/2020 Glucose 102  Current Dose per office note 03/06/2020: Rinvoq 15 mg 1 tablet daily  DX: Rheumatoid arthritis   Okay to refill per Dr. Estanislado Pandy

## 2020-03-21 ENCOUNTER — Telehealth: Payer: Self-pay

## 2020-03-21 NOTE — Telephone Encounter (Signed)
[place]

## 2020-03-23 ENCOUNTER — Other Ambulatory Visit: Payer: Self-pay

## 2020-03-23 ENCOUNTER — Encounter: Payer: Self-pay | Admitting: Gastroenterology

## 2020-03-23 ENCOUNTER — Ambulatory Visit (AMBULATORY_SURGERY_CENTER): Payer: Self-pay | Admitting: *Deleted

## 2020-03-23 VITALS — Ht 70.0 in | Wt 194.0 lb

## 2020-03-23 DIAGNOSIS — Z1211 Encounter for screening for malignant neoplasm of colon: Secondary | ICD-10-CM

## 2020-03-23 MED ORDER — SUTAB 1479-225-188 MG PO TABS: 24.0000 | ORAL_TABLET | ORAL | 0 refills | Status: DC

## 2020-03-23 NOTE — Progress Notes (Signed)
cov vacc x 2 No egg or soy allergy known to patient  No issues with past sedation with any surgeries or procedures no intubation problems in the past  No diet pills per patient No home 02 use per patient  No blood thinners per patient  Pt denies issues with constipation  No A fib or A flutter  EMMI video to pt or MyChart  COVID 19 guidelines implemented in [facility] today   [place]  Coupon given to pt in [facility] today   Due to the COVID-19 pandemic we are asking patients to follow these guidelines. Please only bring one care partner. Please be aware that your care partner may wait in the car in the parking lot or if they feel like they will be too hot to wait in the car, they may wait in the lobby on the 4th floor. All care partners are required to wear a mask the entire time (we do not have any that we can provide them), they need to practice social distancing, and we will do a Covid check for all patient's and care partners when you arrive. Also we will check their temperature and your temperature. If the care partner waits in their car they need to stay in the parking lot the entire time and we will call them on their cell phone when the patient is ready for discharge so they can bring the car to the front of the building. Also all patient's will need to wear a mask into building.

## 2020-03-29 ENCOUNTER — Encounter: Payer: Self-pay | Admitting: Rheumatology

## 2020-03-30 ENCOUNTER — Encounter: Payer: Self-pay | Admitting: Family Medicine

## 2020-03-30 NOTE — Telephone Encounter (Signed)
There are no recommendations to stop Rinvoq.  It is an immunosuppressive agent.  You may discuss this further with the gastroenterologist.

## 2020-04-08 ENCOUNTER — Encounter: Payer: Self-pay | Admitting: Family Medicine

## 2020-04-09 ENCOUNTER — Other Ambulatory Visit: Payer: Self-pay | Admitting: Rheumatology

## 2020-04-09 NOTE — Telephone Encounter (Signed)
Please check if patient is taking long-term tramadol or it was only one-time prescription.  Cymbalta interacts with tramadol.

## 2020-04-10 ENCOUNTER — Other Ambulatory Visit: Payer: Self-pay

## 2020-04-10 MED ORDER — LOSARTAN POTASSIUM 25 MG PO TABS: 25.0000 mg | ORAL_TABLET | Freq: Every day | ORAL | 1 refills | Status: DC

## 2020-04-10 NOTE — Telephone Encounter (Signed)
Attempted to contact the patient and unable to leave a message, no answer.  

## 2020-04-11 NOTE — Telephone Encounter (Signed)
Patient states the Tramadol was a one time prescription. Patient advised tramadol interacts with Cymbalta.   Last Visit: 03/06/2020 Next Visit: 07/27/2020  Okay to refill Cymbalta?

## 2020-04-13 ENCOUNTER — Telehealth: Payer: Self-pay | Admitting: Gastroenterology

## 2020-04-13 ENCOUNTER — Other Ambulatory Visit: Payer: Self-pay

## 2020-04-13 ENCOUNTER — Ambulatory Visit (AMBULATORY_SURGERY_CENTER): Payer: BC Managed Care – PPO | Admitting: Gastroenterology

## 2020-04-13 ENCOUNTER — Encounter: Payer: Self-pay | Admitting: Gastroenterology

## 2020-04-13 VITALS — BP 135/87 | HR 73 | Temp 98.4°F | Resp 14 | Ht 70.0 in | Wt 194.0 lb

## 2020-04-13 DIAGNOSIS — D125 Benign neoplasm of sigmoid colon: Secondary | ICD-10-CM | POA: Diagnosis not present

## 2020-04-13 DIAGNOSIS — K641 Second degree hemorrhoids: Secondary | ICD-10-CM

## 2020-04-13 DIAGNOSIS — D128 Benign neoplasm of rectum: Secondary | ICD-10-CM

## 2020-04-13 DIAGNOSIS — K635 Polyp of colon: Secondary | ICD-10-CM

## 2020-04-13 DIAGNOSIS — Z1211 Encounter for screening for malignant neoplasm of colon: Secondary | ICD-10-CM

## 2020-04-13 DIAGNOSIS — K5730 Diverticulosis of large intestine without perforation or abscess without bleeding: Secondary | ICD-10-CM

## 2020-04-13 DIAGNOSIS — K621 Rectal polyp: Secondary | ICD-10-CM

## 2020-04-13 DIAGNOSIS — D123 Benign neoplasm of transverse colon: Secondary | ICD-10-CM

## 2020-04-13 MED ORDER — SODIUM CHLORIDE 0.9 % IV SOLN: 500.0000 mL | Freq: Once | INTRAVENOUS | Status: DC

## 2020-04-13 NOTE — Telephone Encounter (Signed)
Patient called in this evening. States he developed hives on his arms in the past hour or so. They are pruritic. It is located only on his arms, no where else. He denies any respiratory complaints, no shortness of breath or wheezing, otherwise feels well. He had a colonoscopy at 9 AM today, uneventful, asking if hives could be related to propofol. He has been on a Upadacitinib for the past 7 weeks, otherwise denies any other new meds. This would be an unusual reaction to have from propofol this fare out from his procedure. He just took 50mg  benadryl, recommend he observe for now. If it improves over time and resolves would just monitor. If worsens and spreads over his body or especially causes breathing problems / wheezing / swelling then he will need to go to the ED. Again he denies any respiratory symptoms and will continue to monitor post benadryl.  [facility]. Claiborne Billings can you please call this patient in the AM to check on him. Thanks

## 2020-04-13 NOTE — Op Note (Signed)
[facility] Patient Name: Benjamin Fields Procedure Date: 04/13/2020 9:17 AM MRN: [id number] Endoscopist: Gerrit Heck , MD Age: 59 Referring MD:  Date of Birth: 1960-08-29 Gender: Male Account #: [id number] Procedure:                Colonoscopy Indications:              Screening for colorectal malignant neoplasm (last                            colonoscopy was more than 10 years ago) Medicines:                Monitored Anesthesia Care Procedure:                Pre-Anesthesia Assessment:                           - Prior to the procedure, a History and Physical                            was performed, and patient medications and                            allergies were reviewed. The patient's tolerance of                            previous anesthesia was also reviewed. The risks                            and benefits of the procedure and the sedation                            options and risks were discussed with the patient.                            All questions were answered, and informed consent                            was obtained. Prior Anticoagulants: The patient has                            taken no previous anticoagulant or antiplatelet                            agents. ASA Grade Assessment: II - A patient with                            mild systemic disease. After reviewing the risks                            and benefits, the patient was deemed in                            satisfactory condition to undergo the procedure.  After obtaining informed consent, the colonoscope                            was passed under direct vision. Throughout the                            procedure, the patient's blood pressure, pulse, and                            oxygen saturations were monitored continuously. The                            Colonoscope was introduced through the anus and                            advanced to the the  cecum, identified by                            appendiceal orifice and ileocecal valve. The                            colonoscopy was performed without difficulty. The                            patient tolerated the procedure well. The quality                            of the bowel preparation was good. The ileocecal                            valve, appendiceal orifice, and rectum were                            photographed. Scope In: 9:23:24 AM Scope Out: 9:40:50 AM Scope Withdrawal Time: 0 hours 14 minutes 50 seconds  Total Procedure Duration: 0 hours 17 minutes 26 seconds  Findings:                 The perianal and digital rectal examinations were                            normal.                           Two sessile polyps were found in the sigmoid colon                            and transverse colon. The polyps were 3 to 4 mm in                            size. These polyps were removed with a cold snare.                            Resection and retrieval were complete. Estimated  blood loss was minimal.                           A 1 mm polyp was found in the distal rectum. The                            polyp was sessile. The polyp was removed with a                            cold biopsy forceps. Resection and retrieval were                            complete. Estimated blood loss was minimal.                           Multiple small and large-mouthed diverticula were                            found in the sigmoid colon.                           Non-bleeding internal hemorrhoids were found during                            retroflexion. The hemorrhoids were medium-sized. Complications:            No immediate complications. Estimated Blood Loss:     Estimated blood loss was minimal. Impression:               - Two 3 to 4 mm polyps in the sigmoid colon and in                            the transverse colon, removed with a cold snare.                             Resected and retrieved.                           - One 1 mm polyp in the distal rectum, removed with                            a cold biopsy forceps. Resected and retrieved.                           - Diverticulosis in the sigmoid colon.                           - Non-bleeding internal hemorrhoids. Recommendation:           - Patient has a contact number available for                            emergencies. The signs and symptoms of potential  delayed complications were discussed with the                            patient. Return to normal activities tomorrow.                            Written discharge instructions were provided to the                            patient.                           - Resume previous diet.                           - Continue present medications.                           - Await pathology results.                           - Repeat colonoscopy for surveillance based on                            pathology results.                           - Return to GI clinic PRN.                           - Use fiber, for example Citrucel, Fibercon, Konsyl                            or Metamucil.                           - Internal hemorrhoids were noted on this study and                            may be amenable to hemorrhoid band ligation. If you                            are interested in further treatment of these                            hemorrhoids with band ligation, please contact my                            clinic to set up an appointment for evaluation and                            treatment. Gerrit Heck, MD 04/13/2020 9:48:02 AM

## 2020-04-13 NOTE — Progress Notes (Signed)
A and O x3. Report to RN. Tolerated MAC anesthesia well.

## 2020-04-13 NOTE — Patient Instructions (Signed)
Handouts given for polyps, hemorrhoids, diverticulosis and high fiber diet.  YOU HAD AN ENDOSCOPIC PROCEDURE TODAY AT [facility]:   Refer to the procedure report that was given to you for any specific questions about what was found during the examination.  If the procedure report does not answer your questions, please call your gastroenterologist to clarify.  If you requested that your care partner not be given the details of your procedure findings, then the procedure report has been included in a sealed envelope for you to review at your convenience later.  YOU SHOULD EXPECT: Some feelings of bloating in the abdomen. Passage of more gas than usual.  Walking can help get rid of the air that was put into your GI tract during the procedure and reduce the bloating. If you had a lower endoscopy (such as a colonoscopy or flexible sigmoidoscopy) you may notice spotting of blood in your stool or on the toilet paper. If you underwent a bowel prep for your procedure, you may not have a normal bowel movement for a few days.  Please Note:  You might notice some irritation and congestion in your nose or some drainage.  This is from the oxygen used during your procedure.  There is no need for concern and it should clear up in a day or so.  SYMPTOMS TO REPORT IMMEDIATELY:   Following lower endoscopy (colonoscopy or flexible sigmoidoscopy):  Excessive amounts of blood in the stool  Significant tenderness or worsening of abdominal pains  Swelling of the abdomen that is new, acute  Fever of 100F or higher  For urgent or emergent issues, a gastroenterologist can be reached at any hour by calling [phone]. Do not use MyChart messaging for urgent concerns.    DIET:  We do recommend a small meal at first, but then you may proceed to your regular diet.  Drink plenty of fluids but you should avoid alcoholic beverages for 24 hours.  ACTIVITY:  You should plan to take it easy for the rest of  today and you should NOT DRIVE or use heavy machinery until tomorrow (because of the sedation medicines used during the test).    FOLLOW UP: Our staff will call the number listed on your records 48-72 hours following your procedure to check on you and address any questions or concerns that you may have regarding the information given to you following your procedure. If we do not reach you, we will leave a message.  We will attempt to reach you two times.  During this call, we will ask if you have developed any symptoms of COVID 19. If you develop any symptoms (ie: fever, flu-like symptoms, shortness of breath, cough etc.) before then, please call [phone].  If you test positive for Covid 19 in the 2 weeks post procedure, please call and report this information to [place].    If any biopsies were taken you will be contacted by phone or by letter within the next 1-3 weeks.  Please call us at [phone] if you have not heard about the biopsies in 3 weeks.    SIGNATURES/CONFIDENTIALITY: You and/or your care partner have signed paperwork which will be entered into your electronic medical record.  These signatures attest to the fact that that the information above on your After Visit Summary has been reviewed and is understood.  Full responsibility of the confidentiality of this discharge information lies with you and/or your care-partner.

## 2020-04-13 NOTE — Progress Notes (Signed)
Called to room to assist during endoscopic procedure.  Patient ID and intended procedure confirmed with present staff. Received instructions for my participation in the procedure from the performing physician.  

## 2020-04-13 NOTE — Progress Notes (Signed)
Pt's states no medical or surgical changes since previsit or office visit.  Vitals- Courtney 

## 2020-04-14 NOTE — Telephone Encounter (Signed)
I spoke to patient this morning who states that he took Benadryl 50mg  last night after developing hives an his arms 04/13/20. He reports that the hives are completely gone and has no ill effects. He will contact the office with any questions or concerns.

## 2020-04-14 NOTE — Telephone Encounter (Signed)
Called both phone numbers in chart to check on patient this morning,but neither numbers have voicemail set up to leave a message. Will continue to try to reach patient and check on him.

## 2020-04-14 NOTE — Telephone Encounter (Signed)
Thanks Benjamin Fields, appreciate ou helping him out overnight. We will check on him this morning.

## 2020-04-17 ENCOUNTER — Encounter: Payer: Self-pay | Admitting: Gastroenterology

## 2020-04-17 ENCOUNTER — Telehealth: Payer: Self-pay | Admitting: *Deleted

## 2020-04-17 NOTE — Telephone Encounter (Signed)
Agree that this seems unrelated to the recent procedures, and would recommend conservative management for back pain and f/u with PCM if continues and not responding to previous interventions.

## 2020-04-17 NOTE — Telephone Encounter (Signed)
  Follow up Call-  Call back number 04/13/2020  Post procedure Call Back phone  # [phone]  Permission to leave phone message Yes  Some recent data might be hidden     Patient questions:  Do you have a fever, pain , or abdominal swelling? Yes.   Pain Score  4 *  Have you tolerated food without any problems? Yes.    Have you been able to return to your normal activities? Yes.    Do you have any questions about your discharge instructions: Diet   No. Medications  No. Follow up visit  No.  Do you have questions or concerns about your Care? Yes.   Pt is experiencing back discomfort rates 4/10 up to 7/10 when bending over, getting up from chair etc. since his procedure. He has had back pain on and off in the past. Told his I didn't think this was related to the procedure but would let Dr. Bryan Lemma know. Actions: * If pain score is 4 or above: Physician/ provider Notified : Vito Cirigliano, DO.      1. Have you developed a fever since your procedure? no  2.   Have you had an respiratory symptoms (SOB or cough) since your procedure? no  3.   Have you tested positive for COVID 19 since your procedure no  4.   Have you had any family members/close contacts diagnosed with the COVID 19 since your procedure?  no   If yes to any of these questions please route to Joylene John, RN and Joella Prince, RN

## 2020-04-17 NOTE — Telephone Encounter (Signed)
Attempted f/u phone call. No answer. No option to leave a message.

## 2020-04-26 ENCOUNTER — Encounter: Payer: Self-pay | Admitting: Rheumatology

## 2020-04-26 DIAGNOSIS — M0609 Rheumatoid arthritis without rheumatoid factor, multiple sites: Secondary | ICD-10-CM

## 2020-04-26 DIAGNOSIS — M5030 Other cervical disc degeneration, unspecified cervical region: Secondary | ICD-10-CM

## 2020-04-26 HISTORY — PX: COLONOSCOPY: SHX174

## 2020-04-26 NOTE — Telephone Encounter (Signed)
Please advise patient that a flare is possible on any medication.  If he is having a flare and wants to be seen in the office we can schedule an appointment.  If he wants a prednisone taper then we can call in a prednisone taper starting at 20 mg and taper by 5 mg every 4 days.  He should be taking Rinvoq on a regular basis.  He is also given instructions regarding COVID-19 vaccination and booster.

## 2020-05-03 NOTE — Telephone Encounter (Signed)
Please ask patient if he wants to schedule an appointment.He will also benefit from a prednisone taper as discussed above.

## 2020-05-05 NOTE — Telephone Encounter (Signed)
Please refer him to [facility].

## 2020-05-06 ENCOUNTER — Encounter: Payer: Self-pay | Admitting: Orthopaedic Surgery

## 2020-05-06 DIAGNOSIS — M25552 Pain in left hip: Secondary | ICD-10-CM

## 2020-05-09 ENCOUNTER — Ambulatory Visit: Payer: BC Managed Care – PPO | Admitting: Orthopaedic Surgery

## 2020-05-15 ENCOUNTER — Encounter: Payer: Self-pay | Admitting: *Deleted

## 2020-05-16 ENCOUNTER — Ambulatory Visit: Payer: BC Managed Care – PPO | Admitting: Orthopaedic Surgery

## 2020-06-19 ENCOUNTER — Encounter: Payer: Self-pay | Admitting: Rheumatology

## 2020-06-19 NOTE — Telephone Encounter (Signed)
Patient scheduled for an appointment on 06/22/2020 at 2:20 pm.

## 2020-06-20 NOTE — Progress Notes (Signed)
Office Visit Note  Patient: Benjamin Fields             Date of Birth: [birth year]           MRN: [id number]             PCP: Libby Maw, MD Referring: Libby Maw,* Visit Date: 06/21/2020 Occupation: @GUAROCC @  Subjective:  Right knee joint   History of Present Illness: Benjamin Fields is a 59 y.o. male with history of seronegative rheumatoid arthritis.  Patient is not taking any immunosuppressive agents at this time.  He discontinued Rinvoq in September 2021 and is awaiting appointment at [facility] rheumatology in December 2021.  He has chronic pain in his right shoulder joint which causes nocturnal pain at times.  He started experiencing severe pain and swelling in his right knee joint 3 to 4 days ago.  He denies any injury.  He states that he was having difficulty walking yesterday as well as on Monday but today his discomfort improved slightly.  He has been taking Aleve for pain relief.  He takes tramadol very sparingly for pain relief.  He continues to take gabapentin and Cymbalta as prescribed. He denies any recent infections.  Right knee joint pain and swelling     Activities of Daily Living:  Patient reports morning stiffness for 1-2 hours.   Patient Denies nocturnal pain.  Difficulty dressing/grooming: Reports Difficulty climbing stairs: Denies Difficulty getting out of chair: Denies Difficulty using hands for taps, buttons, cutlery, and/or writing: Denies  Review of Systems  Constitutional: Positive for fatigue.  HENT: Negative for mouth sores, mouth dryness and nose dryness.   Eyes: Positive for pain, redness, itching and dryness. Negative for visual disturbance.  Respiratory: Negative for shortness of breath and difficulty breathing.   Cardiovascular: Negative for chest pain and palpitations.  Gastrointestinal: Positive for constipation. Negative for blood in stool and diarrhea.  Endocrine: Negative for increased urination.  Genitourinary: Negative  for difficulty urinating and painful urination.  Musculoskeletal: Positive for arthralgias, joint pain, joint swelling, myalgias, morning stiffness, muscle tenderness and myalgias.  Skin: Negative for color change, rash and redness.  Allergic/Immunologic: Negative for susceptible to infections.  Neurological: Positive for numbness. Negative for dizziness, headaches, memory loss and weakness.  Hematological: Negative for bruising/bleeding tendency.  Psychiatric/Behavioral: Negative for confusion.    PMFS History:  Patient Active Problem List   Diagnosis Date Noted  . Low back pain 02/17/2020  . Left hip pain 02/17/2020  . Healthcare maintenance 11/16/2019  . Rheumatoid arthritis of multiple sites with negative rheumatoid factor ([place]) 05/19/2018  . Tinea versicolor 05/19/2018  . Essential hypertension 03/17/2018  . DDD (degenerative disc disease), cervical 03/16/2018    Past Medical History:  Diagnosis Date  . Allergy    mild- no meds   . Anxiety   . Hypertension   . Neuromuscular disorder ([place])    mild nerve issue right leg   . RA (rheumatoid arthritis) (HCC)     Family History  Problem Relation Age of Onset  . Parkinson's disease Mother   . Atrial fibrillation Mother   . Heart failure Father   . Hypertension Father   . Prostate cancer Father   . Colon cancer Neg Hx   . Colon polyps Neg Hx   . Esophageal cancer Neg Hx   . Rectal cancer Neg Hx   . Stomach cancer Neg Hx    Past Surgical History:  Procedure Laterality Date  . CERVICAL SPINE SURGERY  2010  . COLONOSCOPY    . COLONOSCOPY  04/2020  . TONSILLECTOMY     Social History   Social History Narrative   Lives home with wife.  workd at [employer]  Education: Masters.  Children 2.  Caffeine 2 cups daily   Immunization History  Administered Date(s) Administered  . Influenza Whole 07/27/2019  . Influenza,inj,Quad PF,6+ Mos 07/06/2016  . PFIZER SARS-COV-2 Vaccination 11/25/2019, 12/20/2019  . Tdap 08/27/2019      Objective: Vital Signs: BP (!) 148/85 (BP Location: Left Arm, Patient Position: Sitting, Cuff Size: Normal)   Pulse (!) 103   Resp 15   Ht 5' 10"  (1.778 m)   Wt 202 lb (91.6 kg)   BMI 28.98 kg/m    Physical Exam Vitals and nursing note reviewed.  Constitutional:      Appearance: He is well-developed.  HENT:     Head: Normocephalic and atraumatic.  Eyes:     Conjunctiva/sclera: Conjunctivae normal.     Pupils: Pupils are equal, round, and reactive to light.  Pulmonary:     Effort: Pulmonary effort is normal.  Abdominal:     Palpations: Abdomen is soft.  Musculoskeletal:     Cervical back: Normal range of motion and neck supple.  Skin:    General: Skin is warm and dry.     Capillary Refill: Capillary refill takes less than 2 seconds.  Neurological:     Mental Status: He is alert and oriented to person, place, and time.  Psychiatric:        Behavior: Behavior normal.      Musculoskeletal Exam: C-spine good ROM.  Postural thoracic kyphosis.  Right shoulder joint palpable effusion and tenderness. Limited ROM of the right shoulder to about 60 degrees.  Left shoulder has full ROM with no discomfort.  Elbow joints, wrist joints, MCPs, PIPs, and DIPs good ROM with no synovitis.  Complete fist formation bilaterally.  Left hip has limited and painful ROM.  Right hip has good ROM with no discomfort.  Right knee joint warmth and effusion noted.  Left knee has good ROM with no warmth or effusion.  Ankle joints good ROM with no tenderness or swelling.   CDAI Exam: CDAI Score: -- Patient Global: 5 mm; Provider Global: -- Swollen: 2 ; Tender: 2  Joint Exam 06/21/2020      Right  Left  Glenohumeral  Swollen Tender     Knee  Swollen Tender        Investigation: No additional findings.  Imaging: XR KNEE 3 VIEW RIGHT  Result Date: 06/21/2020 No joint space narrowing was noted.  Moderate patellofemoral narrowing was noted.  No chondrocalcinosis was noted. Impression: These  findings are consistent moderate chondromalacia patella.   Recent Labs: Lab Results  Component Value Date   WBC 8.3 02/17/2020   HGB 15.1 02/17/2020   PLT 248.0 02/17/2020   NA 140 02/17/2020   K 4.5 02/17/2020   CL 105 02/17/2020   CO2 28 02/17/2020   GLUCOSE 102 (H) 02/17/2020   BUN 18 02/17/2020   CREATININE 1.19 02/17/2020   BILITOT 0.6 02/17/2020   ALKPHOS 74 02/17/2020   AST 16 02/17/2020   ALT 26 02/17/2020   PROT 6.0 02/17/2020   ALBUMIN 4.2 02/17/2020   CALCIUM 9.6 02/17/2020   GFRAA 75 10/22/2019   QFTBGOLDPLUS NEGATIVE 02/24/2020    Speciality Comments: Prior therapy: Enbrel and Humira (inadequate reponse)  Procedures:  Large Joint Inj: R knee on 06/21/2020 3:50 PM Indications: pain Details:  22 G 1.5 in needle, lateral approach  Arthrogram: No  Medications: 40 mg triamcinolone acetonide 40 MG/ML; 3 mL lidocaine 1 % Aspirate: 2 mL Outcome: tolerated well, no immediate complications Procedure, treatment alternatives, risks and benefits explained, specific risks discussed. Consent was given by the patient. Immediately prior to procedure a time out was called to verify the correct patient, procedure, equipment, support staff and site/side marked as required. Patient was prepped and draped in the usual sterile fashion.     Allergies: Cortisone   Assessment / Plan:     Visit Diagnoses: Rheumatoid arthritis of multiple sites with negative rheumatoid factor (HCC) - RF-, anti-CCP-, 14-3-3 eta-, ESR 2: He presents today with severe pain and an effusion in the right knee joint.  His right knee joint pain started 3 to 4 days ago with no injury prior to onset of symptoms.  He was having difficulty walking for the past 2 to 3 days due to the severity of pain and stiffness.  He has a palpable effusion in the right shoulder joint, which has been persistent.  He experiences severe nocturnal pain and has limited abduction to about 60 degrees and painful internal rotation.  He  has not taking any immunosuppressive agents at this time.  He has not taken prednisone recently.  He takes Aleve and tramadol as needed for pain relief.  He discontinued Rinvoq in September 2021 due to adverse SE.  He does not want to take any immunosuppressive agents at this time.  He has an upcoming appointment with [facility] rheumatology for transfer of care.  He is looking forward to this visit.  He was advised to follow-up in our office as needed.  High risk medication use -He is not taking any immunosuppressives at this time. He is no longer taking Rinvoq-started in October 2020-d/c Jan 2021-Restarted July 2021/d/c in Sept 2021.  Discontinued Otrexup 20 mg/wk in January 2021. (inadequate response to Enbrel, Humira, and actemra)   Effusion of right shoulder: He has palpable effusion with warmth and tenderness of the right shoulder joint on examination today.  He has abduction to about 60 degrees and painful limited internal rotation.  He has been experiencing severe nocturnal pain recently.  He does not want to take an immunosuppressive agent at this time.  Rib pain on left side: Resolved  Effusion of left knee: Resolved.  He has good range of motion of the left knee joint with no warmth or effusion.  Effusion of right knee: He presents today with warmth, tenderness, and effusion in the right knee joint.  His discomfort started about 3 to 4 days ago.  He did not have any injury prior to the onset of symptoms.  He has been experiencing nocturnal pain as well as difficulty walking during the day.  He has tried taking Aleve without much relief.  A right knee joint aspiration was attempted today but only 2 mL of fluid was drawn off.  His right knee was injected with cortisone.  Procedure note was completed above.  He will be establishing care with [facility]ology in December 2021.  He plans on continuing to take Aleve and tramadol as needed for symptomatic relief.   Chronic pain of right knee -he presents  today with right knee joint pain and effusion.  X-rays of the right knee were obtained today.  No joint space narrowing in the medial or lateral compartment were noted.  He has moderate chondromalacia patella.  The right knee joint was injected with cortisone  today.  Aftercare was discussed.  Plan: XR KNEE 3 VIEW RIGHT  Numbness and tingling of both feet: He continues to experience intermittent paresthesias in both feet.  He continues to take gabapentin as prescribed.  DDD (degenerative disc disease), cervical: He has good range of motion on examination today.  Other medical conditions are listed as follows:  Tinea versicolor  Other fatigue  Essential hypertension: He was advised to monitor his blood pressure closely following the cortisone injection today.   Orders: Orders Placed This Encounter  Procedures  . Large Joint Inj  . XR KNEE 3 VIEW RIGHT   No orders of the defined types were placed in this encounter.    Follow-Up Instructions: Return if symptoms worsen or fail to improve, for Rheumatoid arthritis, DDD.   Ofilia Neas, PA-C  Note - This record has been created using Dragon software.  Chart creation errors have been sought, but may not always  have been located. Such creation errors do not reflect on  the standard of medical care.

## 2020-06-21 ENCOUNTER — Ambulatory Visit: Payer: BC Managed Care – PPO | Admitting: Physician Assistant

## 2020-06-21 ENCOUNTER — Other Ambulatory Visit: Payer: Self-pay

## 2020-06-21 ENCOUNTER — Encounter: Payer: Self-pay | Admitting: Physician Assistant

## 2020-06-21 ENCOUNTER — Ambulatory Visit: Payer: Self-pay

## 2020-06-21 VITALS — BP 148/85 | HR 103 | Resp 15 | Ht 70.0 in | Wt 202.0 lb

## 2020-06-21 DIAGNOSIS — M25561 Pain in right knee: Secondary | ICD-10-CM

## 2020-06-21 DIAGNOSIS — M25411 Effusion, right shoulder: Secondary | ICD-10-CM | POA: Diagnosis not present

## 2020-06-21 DIAGNOSIS — Z79899 Other long term (current) drug therapy: Secondary | ICD-10-CM | POA: Diagnosis not present

## 2020-06-21 DIAGNOSIS — R200 Anesthesia of skin: Secondary | ICD-10-CM

## 2020-06-21 DIAGNOSIS — M25461 Effusion, right knee: Secondary | ICD-10-CM | POA: Diagnosis not present

## 2020-06-21 DIAGNOSIS — B360 Pityriasis versicolor: Secondary | ICD-10-CM

## 2020-06-21 DIAGNOSIS — M25462 Effusion, left knee: Secondary | ICD-10-CM

## 2020-06-21 DIAGNOSIS — R5383 Other fatigue: Secondary | ICD-10-CM

## 2020-06-21 DIAGNOSIS — M5030 Other cervical disc degeneration, unspecified cervical region: Secondary | ICD-10-CM

## 2020-06-21 DIAGNOSIS — R0781 Pleurodynia: Secondary | ICD-10-CM

## 2020-06-21 DIAGNOSIS — R202 Paresthesia of skin: Secondary | ICD-10-CM

## 2020-06-21 DIAGNOSIS — R0789 Other chest pain: Secondary | ICD-10-CM

## 2020-06-21 DIAGNOSIS — M0609 Rheumatoid arthritis without rheumatoid factor, multiple sites: Secondary | ICD-10-CM | POA: Diagnosis not present

## 2020-06-21 DIAGNOSIS — I10 Essential (primary) hypertension: Secondary | ICD-10-CM

## 2020-06-21 DIAGNOSIS — G8929 Other chronic pain: Secondary | ICD-10-CM

## 2020-06-21 MED ORDER — TRIAMCINOLONE ACETONIDE 40 MG/ML IJ SUSP
40.0000 mg | INTRAMUSCULAR | Status: AC | PRN
  Administered 2020-06-21: 40 mg via INTRA_ARTICULAR

## 2020-06-21 MED ORDER — LIDOCAINE HCL 1 % IJ SOLN
3.0000 mL | INTRAMUSCULAR | Status: AC | PRN
  Administered 2020-06-21: 3 mL

## 2020-06-28 ENCOUNTER — Encounter: Payer: Self-pay | Admitting: Rheumatology

## 2020-06-29 NOTE — Progress Notes (Signed)
Chief Complaint  Patient presents with  . Follow-up    pt here fo ra f/u on polyneuropathy. Pt says after taking his medication he still get a jerking in his whole body, but its mostly in his legs.  . Room 1     HISTORY OF PRESENT ILLNESS: Today 07/03/20  Benjamin Fields is a 59 y.o. male here today for follow up for neuropathy and RLS. We increased gabapentin dose to 100mg  in am, 100mg  at lunch and 300mg  at bedtime. He has also continued duloxetine 60mg  daily (rhematology). He feels that symptoms have improved. He does have a rare night where his legs seem more jumpy. He has correlated this to days where he has had more caffeine. He continues to note some tingling/numbness in both feet and rarely both hands. He continues to follow up with rheumatology for RA. Rinvoq was discontinued due to back pain. He was referred to Dr Melford Aase with [facility]. He has appt 12/27.    HISTORY (copied from my note on 12/29/2019)  Benjamin Fields is a 59 y.o. male here today for follow up for neuropathy and RLS. He continues duloxetine 60mg  and gabapentin 100mg  TID. He does feel that these medications help. He continues to note intermittent "jumping" of muscles. It is worse in the evenings when he sits in a recliner. Sometimes he feels that his whole body jumps. He does feel that caffeine makes RLS worse. He continues follow up with [facility]. He has stopped Rinvoq as it was stopped due to a cold in December. He felt that symptoms were improved off of Rinvoq and is now just monitoring. He has noted burning and tingling of feet are better off medication. He has had some intermittent low back pain over the past few months. He was seen in UC and given "two shots" that have helped. He plans to see PCP in June.   HISTORY: (copied from my note on 07/10/2019)  Benjamin Fields a 59 y.o.malehere today for follow up for polyneuropathy. He continues Cymbalta 60mg  daily.He feels that this helps with his mood.  Burning pain seems stable from last visit. Over the past 2-3 weeks, he has had more jerking and twitching of his legs. This worsens at night. Sometimes it wakes him at night.He is followed by rheumatology regularly.He is taking Otreup and Rinvoq for RA.   HISTORY: (copied fromDr Penumalli'snote on 09/04/2018)  59 year old male here for evaluation of numbness and tingling.  May 2019 patient had onset of left knee and right shoulder pain and swelling. He was diagnosed with rheumatoid arthritis. He also had red eyes, dry eyes and generalized pain. He has been started on methotrexate in July 2019 and Enbrel in November 2019. He is also been on prednisone.   Early on his diagnosis he noticed some tingling sensation in his right hand. He also notes that in his right leg. He has mild numbness and tingling on the left side as well. No sensations in his face, neck or lower back.    REVIEW OF SYSTEMS: Out of a complete 14 system review of symptoms, the patient complains only of the following symptoms, chronic joint pain, numbness/tingling feet and hands, restless legs and all other reviewed systems are negative.   ALLERGIES: Allergies  Allergen Reactions  . Cortisone     FLARE     HOME MEDICATIONS: Outpatient Medications Prior to Visit  Medication Sig Dispense Refill  . DULoxetine (CYMBALTA) 60 MG capsule Take 1 capsule by mouth  once daily 90 capsule 0  . gabapentin (NEURONTIN) 100 MG capsule Take 1 capsule in am, 1 capsule at lunch and 3 capsules at bedtime. 360 capsule 3  . losartan (COZAAR) 25 MG tablet Take 1 tablet (25 mg total) by mouth daily. for high blood pressure 90 tablet 1  . Multiple Vitamin (MULTIVITAMIN) tablet Take 1 tablet by mouth daily. OTC    . traMADol (ULTRAM) 50 MG tablet Take 50 mg by mouth as needed.     No facility-administered medications prior to visit.     PAST MEDICAL HISTORY: Past Medical History:  Diagnosis Date  . Allergy    mild- no meds    . Anxiety   . Hypertension   . Neuromuscular disorder ([place])    mild nerve issue right leg   . RA (rheumatoid arthritis) ([place])      PAST SURGICAL HISTORY: Past Surgical History:  Procedure Laterality Date  . CERVICAL SPINE SURGERY  2010  . COLONOSCOPY    . COLONOSCOPY  04/2020  . TONSILLECTOMY       FAMILY HISTORY: Family History  Problem Relation Age of Onset  . Parkinson's disease Mother   . Atrial fibrillation Mother   . Heart failure Father   . Hypertension Father   . Prostate cancer Father   . Colon cancer Neg Hx   . Colon polyps Neg Hx   . Esophageal cancer Neg Hx   . Rectal cancer Neg Hx   . Stomach cancer Neg Hx      SOCIAL HISTORY: Social History   Socioeconomic History  . Marital status: Married    Spouse name: Not on file  . Number of children: Not on file  . Years of education: Not on file  . Highest education level: Not on file  Occupational History  . Not on file  Tobacco Use  . Smoking status: Never Smoker  . Smokeless tobacco: Never Used  Vaping Use  . Vaping Use: Never used  Substance and Sexual Activity  . Alcohol use: Never  . Drug use: Never  . Sexual activity: Not on file  Other Topics Concern  . Not on file  Social History Narrative   Lives home with wife.  workd at [employer]  Education: Masters.  Children 2.  Caffeine 2 cups daily   Social Determinants of Health   Financial Resource Strain:   . Difficulty of Paying Living Expenses: Not on file  Food Insecurity:   . Worried About Charity fundraiser in the Last Year: Not on file  . Ran Out of Food in the Last Year: Not on file  Transportation Needs:   . Lack of Transportation (Medical): Not on file  . Lack of Transportation (Non-Medical): Not on file  Physical Activity:   . Days of Exercise per Week: Not on file  . Minutes of Exercise per Session: Not on file  Stress:   . Feeling of Stress : Not on file  Social Connections:   . Frequency of Communication with Friends  and Family: Not on file  . Frequency of Social Gatherings with Friends and Family: Not on file  . Attends Religious Services: Not on file  . Active Member of Clubs or Organizations: Not on file  . Attends Archivist Meetings: Not on file  . Marital Status: Not on file  Intimate Partner Violence:   . Fear of Current or Ex-Partner: Not on file  . Emotionally Abused: Not on file  . Physically Abused:  Not on file  . Sexually Abused: Not on file      PHYSICAL EXAM  Vitals:   07/03/20 0821  BP: 138/83  Pulse: 83  Weight: 195 lb (88.5 kg)  Height: 5\' 10"  (1.778 m)   Body mass index is 27.98 kg/m.   Generalized: Well developed, in no acute distress   Cardiology: Normal rate and rhythm, no murmur auscultated Respiratory: Clear to auscultation bilaterally  Neurological examination  Mentation: Alert oriented to time, place, history taking. Follows all commands speech and language fluent Cranial nerve II-XII: Pupils were equal round reactive to light. Extraocular movements were full, visual field were full  Motor: The motor testing reveals 5 over 5 strength of all 4 extremities. Good symmetric motor tone is noted throughout.  Sensory: Sensory testing is intact to soft touch on all 4 extremities. No evidence of extinction is noted.  Gait and station: Gait is normal.     DIAGNOSTIC DATA (LABS, IMAGING, TESTING) - I reviewed patient records, labs, notes, testing and imaging myself where available.  Lab Results  Component Value Date   WBC 8.3 02/17/2020   HGB 15.1 02/17/2020   HCT 43.6 02/17/2020   MCV 90.7 02/17/2020   PLT 248.0 02/17/2020      Component Value Date/Time   NA 140 02/17/2020 0933   K 4.5 02/17/2020 0933   CL 105 02/17/2020 0933   CO2 28 02/17/2020 0933   GLUCOSE 102 (H) 02/17/2020 0933   BUN 18 02/17/2020 0933   CREATININE 1.19 02/17/2020 0933   CREATININE 1.21 10/22/2019 1411   CALCIUM 9.6 02/17/2020 0933   PROT 6.0 02/17/2020 0933   ALBUMIN  4.2 02/17/2020 0933   AST 16 02/17/2020 0933   ALT 26 02/17/2020 0933   ALKPHOS 74 02/17/2020 0933   BILITOT 0.6 02/17/2020 0933   GFRNONAA 65 10/22/2019 1411   GFRAA 75 10/22/2019 1411   Lab Results  Component Value Date   CHOL 188 02/17/2020   HDL 39.30 02/17/2020   LDLCALC 122 (H) 02/17/2020   LDLDIRECT 124.0 02/17/2020   TRIG 133.0 02/17/2020   CHOLHDL 5 02/17/2020   No results found for: HGBA1C Lab Results  Component Value Date   VITAMINB12 1,103 09/04/2018   Lab Results  Component Value Date   TSH 1.03 03/17/2018      ASSESSMENT AND PLAN  60 y.o. year old male  has a past medical history of Allergy, Anxiety, Hypertension, Neuromuscular disorder ([place]), and RA (rheumatoid arthritis) ([place]). here with   Polyneuropathy  RLS (restless legs syndrome)  [name] reports that neuropathy and restless leg symptoms are stable at this time.  We will continue gabapentin 100 mg in the morning, 100 mg at lunch and 300 mg at bedtime.  He will also continue duloxetine 60 mg daily as currently prescribed by rheumatology.  He is scheduled to see [facility] rheumatology in December.  He is aware that he may call me with any questions or concerns about medication refills.  Healthy lifestyle habits reviewed.  He will follow-up with [place] in 1 year, sooner if needed.  He verbalizes understanding and agreement with this plan.   I spent 20 minutes of face-to-face and non-face-to-face time with patient.  This included previsit chart review, lab review, study review, order entry, electronic health record documentation, patient education.    Debbora Presto, MSN, FNP-C 07/03/2020, 8:38 AM  [facility] [address], [place], [place] [address] [phone]

## 2020-07-03 ENCOUNTER — Ambulatory Visit: Payer: BC Managed Care – PPO | Admitting: Family Medicine

## 2020-07-03 ENCOUNTER — Encounter: Payer: Self-pay | Admitting: Family Medicine

## 2020-07-03 VITALS — BP 138/83 | HR 83 | Ht 70.0 in | Wt 195.0 lb

## 2020-07-03 DIAGNOSIS — G629 Polyneuropathy, unspecified: Secondary | ICD-10-CM

## 2020-07-03 DIAGNOSIS — G2581 Restless legs syndrome: Secondary | ICD-10-CM

## 2020-07-03 NOTE — Patient Instructions (Addendum)
Below is our plan:  We will continue gabapentin 100mg  in the morning and at lunch with 300mg  at bedtime. Continue duloxetine 60mg  daily as written by rheumatology. Follow up with [facility] as scheduled. Call us if you need anything.   Please make sure you are staying well hydrated. I recommend 50-60 ounces daily. Well balanced diet and regular exercise encouraged.    Please continue follow up with care team as directed.   Follow up in 1 year  You may receive a survey regarding today's visit. I encourage you to leave honest feed back as I do use this information to improve patient care. Thank you for seeing me today!      Restless Legs Syndrome Restless legs syndrome is a condition that causes uncomfortable feelings or sensations in the legs, especially while sitting or lying down. The sensations usually cause an overwhelming urge to move the legs. The arms can also sometimes be affected. The condition can range from mild to severe. The symptoms often interfere with a person's ability to sleep. What are the causes? The cause of this condition is not known. What increases the risk? The following factors may make you more likely to develop this condition:  Being older than 50.  Pregnancy.  Being a woman. In general, the condition is more common in women than in men.  A family history of the condition.  Having iron deficiency.  Overuse of caffeine, nicotine, or alcohol.  Certain medical conditions, such as kidney disease, Parkinson's disease, or nerve damage.  Certain medicines, such as those for high blood pressure, nausea, colds, allergies, depression, and some heart conditions. What are the signs or symptoms? The main symptom of this condition is uncomfortable sensations in the legs, such as:  Pulling.  Tingling.  Prickling.  Throbbing.  Crawling.  Burning. Usually, the sensations:  Affect both sides of the body.  Are worse when you sit or lie down.  Are worse  at night. These may wake you up or make it difficult to fall asleep.  Make you have a strong urge to move your legs.  Are temporarily relieved by moving your legs. The arms can also be affected, but this is rare. People who have this condition often have tiredness during the day because of their lack of sleep at night. How is this diagnosed? This condition may be diagnosed based on:  Your symptoms.  Blood tests. In some cases, you may be monitored in a sleep lab by a specialist (a sleep study). This can detect any disruptions in your sleep. How is this treated? This condition is treated by managing the symptoms. This may include:  Lifestyle changes, such as exercising, using relaxation techniques, and avoiding caffeine, alcohol, or tobacco.  Medicines. Anti-seizure medicines may be tried first. Follow these instructions at home:     General instructions  Take over-the-counter and prescription medicines only as told by your health care provider.  Use methods to help relieve the uncomfortable sensations, such as: ? Massaging your legs. ? Walking or stretching. ? Taking a cold or hot bath.  Keep all follow-up visits as told by your health care provider. This is important. Lifestyle  Practice good sleep habits. For example, go to bed and get up at the same time every day. Most adults should get 7-9 hours of sleep each night.  Exercise regularly. Try to get at least 30 minutes of exercise most days of the week.  Practice ways of relaxing, such as yoga or meditation.  Avoid caffeine and alcohol.  Do not use any products that contain nicotine or tobacco, such as cigarettes and e-cigarettes. If you need help quitting, ask your health care provider. Contact a health care provider if:  Your symptoms get worse or they do not improve with treatment. Summary  Restless legs syndrome is a condition that causes uncomfortable feelings or sensations in the legs, especially while  sitting or lying down.  The symptoms often interfere with a person's ability to sleep.  This condition is treated by managing the symptoms. You may need to make lifestyle changes or take medicines. This information is not intended to replace advice given to you by your health care provider. Make sure you discuss any questions you have with your health care provider. Document Revised: 09/01/2017 Document Reviewed: 09/01/2017 Elsevier Patient Education  [place].   Peripheral Neuropathy Peripheral neuropathy is a type of nerve damage. It affects nerves that carry signals between the spinal cord and the arms, legs, and the rest of the body (peripheral nerves). It does not affect nerves in the spinal cord or brain. In peripheral neuropathy, one nerve or a group of nerves may be damaged. Peripheral neuropathy is a broad category that includes many specific nerve disorders, like diabetic neuropathy, hereditary neuropathy, and carpal tunnel syndrome. What are the causes? This condition may be caused by:  Diabetes. This is the most common cause of peripheral neuropathy.  Nerve injury.  Pressure or stress on a nerve that lasts a long time.  Lack (deficiency) of B vitamins. This can result from alcoholism, poor diet, or a restricted diet.  Infections.  Autoimmune diseases, such as rheumatoid arthritis and systemic lupus erythematosus.  Nerve diseases that are passed from parent to child (inherited).  Some medicines, such as cancer medicines (chemotherapy).  Poisonous (toxic) substances, such as lead and mercury.  Too little blood flowing to the legs.  Kidney disease.  Thyroid disease. In some cases, the cause of this condition is not known. What are the signs or symptoms? Symptoms of this condition depend on which of your nerves is damaged. Common symptoms include:  Loss of feeling (numbness) in the feet, hands, or both.  Tingling in the feet, hands, or both.  Burning  pain.  Very sensitive skin.  Weakness.  Not being able to move a part of the body (paralysis).  Muscle twitching.  Clumsiness or poor coordination.  Loss of balance.  Not being able to control your bladder.  Feeling dizzy.  Sexual problems. How is this diagnosed? Diagnosing and finding the cause of peripheral neuropathy can be difficult. Your health care provider will take your medical history and do a physical exam. A neurological exam will also be done. This involves checking things that are affected by your brain, spinal cord, and nerves (nervous system). For example, your health care provider will check your reflexes, how you move, and what you can feel. You may have other tests, such as:  Blood tests.  Electromyogram (EMG) and nerve conduction tests. These tests check nerve function and how well the nerves are controlling the muscles.  Imaging tests, such as CT scans or MRI to rule out other causes of your symptoms.  Removing a small piece of nerve to be examined in a lab (nerve biopsy). This is rare.  Removing and examining a small amount of the fluid that surrounds the brain and spinal cord (lumbar puncture). This is rare. How is this treated? Treatment for this condition may involve:  Treating  the underlying cause of the neuropathy, such as diabetes, kidney disease, or vitamin deficiencies.  Stopping medicines that can cause neuropathy, such as chemotherapy.  Medicine to relieve pain. Medicines may include: ? Prescription or over-the-counter pain medicine. ? Antiseizure medicine. ? Antidepressants. ? Pain-relieving patches that are applied to painful areas of skin.  Surgery to relieve pressure on a nerve or to destroy a nerve that is causing pain.  Physical therapy to help improve movement and balance.  Devices to help you move around (assistive devices). Follow these instructions at home: Medicines  Take over-the-counter and prescription medicines only as  told by your health care provider. Do not take any other medicines without first asking your health care provider.  Do not drive or use heavy machinery while taking prescription pain medicine. Lifestyle   Do not use any products that contain nicotine or tobacco, such as cigarettes and e-cigarettes. Smoking keeps blood from reaching damaged nerves. If you need help quitting, ask your health care provider.  Avoid or limit alcohol. Too much alcohol can cause a vitamin B deficiency, and vitamin B is needed for healthy nerves.  Eat a healthy diet. This includes: ? Eating foods that are high in fiber, such as fresh fruits and vegetables, whole grains, and beans. ? Limiting foods that are high in fat and processed sugars, such as fried or sweet foods. General instructions   If you have diabetes, work closely with your health care provider to keep your blood sugar under control.  If you have numbness in your feet: ? Check every day for signs of injury or infection. Watch for redness, warmth, and swelling. ? Wear padded socks and comfortable shoes. These help protect your feet.  Develop a good support system. Living with peripheral neuropathy can be stressful. Consider talking with a mental health specialist or joining a support group.  Use assistive devices and attend physical therapy as told by your health care provider. This may include using a walker or a cane.  Keep all follow-up visits as told by your health care provider. This is important. Contact a health care provider if:  You have new signs or symptoms of peripheral neuropathy.  You are struggling emotionally from dealing with peripheral neuropathy.  Your pain is not well-controlled. Get help right away if:  You have an injury or infection that is not healing normally.  You develop new weakness in an arm or leg.  You fall frequently. Summary  Peripheral neuropathy is when the nerves in the arms, or legs are damaged,  resulting in numbness, weakness, or pain.  There are many causes of peripheral neuropathy, including diabetes, pinched nerves, vitamin deficiencies, autoimmune disease, and hereditary conditions.  Diagnosing and finding the cause of peripheral neuropathy can be difficult. Your health care provider will take your medical history, do a physical exam, and do tests, including blood tests and nerve function tests.  Treatment involves treating the underlying cause of the neuropathy and taking medicines to help control pain. Physical therapy and assistive devices may also help. This information is not intended to replace advice given to you by your health care provider. Make sure you discuss any questions you have with your health care provider. Document Revised: 07/25/2017 Document Reviewed: 10/21/2016 Elsevier Patient Education  2020 [employer].

## 2020-07-04 ENCOUNTER — Encounter: Payer: Self-pay | Admitting: Family Medicine

## 2020-07-04 ENCOUNTER — Other Ambulatory Visit: Payer: Self-pay | Admitting: Family

## 2020-07-04 MED ORDER — DULOXETINE HCL 60 MG PO CPEP
60.0000 mg | ORAL_CAPSULE | Freq: Every day | ORAL | 2 refills | Status: DC
Start: 2020-07-04 — End: 2020-10-02

## 2020-07-13 ENCOUNTER — Other Ambulatory Visit (INDEPENDENT_AMBULATORY_CARE_PROVIDER_SITE_OTHER): Payer: Self-pay | Admitting: Specialist

## 2020-07-14 ENCOUNTER — Other Ambulatory Visit: Payer: Self-pay | Admitting: Orthopaedic Surgery

## 2020-07-14 NOTE — Progress Notes (Deleted)
Office Visit Note  Patient: Benjamin Fields             Date of Birth: May 23, 1961           MRN: [id number]             PCP: Libby Maw, MD Referring: Libby Maw,* Visit Date: 07/27/2020 Occupation: @GUAROCC @  Subjective:  No chief complaint on file.   History of Present Illness: Benjamin Fields is a 59 y.o. male ***   Activities of Daily Living:  Patient reports morning stiffness for *** {minute/hour:19697}.   Patient {ACTIONS;DENIES/REPORTS:21021675::"Denies"} nocturnal pain.  Difficulty dressing/grooming: {ACTIONS;DENIES/REPORTS:21021675::"Denies"} Difficulty climbing stairs: {ACTIONS;DENIES/REPORTS:21021675::"Denies"} Difficulty getting out of chair: {ACTIONS;DENIES/REPORTS:21021675::"Denies"} Difficulty using hands for taps, buttons, cutlery, and/or writing: {ACTIONS;DENIES/REPORTS:21021675::"Denies"}  No Rheumatology ROS completed.   PMFS History:  Patient Active Problem List   Diagnosis Date Noted  . Low back pain 02/17/2020  . Left hip pain 02/17/2020  . Healthcare maintenance 11/16/2019  . Rheumatoid arthritis of multiple sites with negative rheumatoid factor ([place]) 05/19/2018  . Tinea versicolor 05/19/2018  . Essential hypertension 03/17/2018  . DDD (degenerative disc disease), cervical 03/16/2018    Past Medical History:  Diagnosis Date  . Allergy    mild- no meds   . Anxiety   . Hypertension   . Neuromuscular disorder ([employer])    mild nerve issue right leg   . RA (rheumatoid arthritis) (H[facility])     Family History  Problem Relation Age of Onset  . Parkinson's disease Mother   . Atrial fibrillation Mother   . Heart failure Father   . Hypertension Father   . Prostate cancer Father   . Colon cancer Neg Hx   . Colon polyps Neg Hx   . Esophageal cancer Neg Hx   . Rectal cancer Neg Hx   . Stomach cancer Neg Hx    Past Surgical History:  Procedure Laterality Date  . CERVICAL SPINE SURGERY  2010  . COLONOSCOPY    . COLONOSCOPY   04/2020  . TONSILLECTOMY     Social History   Social History Narrative   Lives home with wife.  workd at [employer]  Education: Masters.  Children 2.  Caffeine 2 cups daily   Immunization History  Administered Date(s) Administered  . Influenza Whole 07/27/2019  . Influenza,inj,Quad PF,6+ Mos 07/06/2016  . PFIZER SARS-COV-2 Vaccination 11/25/2019, 12/20/2019  . Tdap 08/27/2019     Objective: Vital Signs: There were no vitals taken for this visit.   Physical Exam   Musculoskeletal Exam: ***  CDAI Exam: CDAI Score: -- Patient Global: --; Provider Global: -- Swollen: --; Tender: -- Joint Exam 07/27/2020   No joint exam has been documented for this visit   There is currently no information documented on the homunculus. Go to the Rheumatology activity and complete the homunculus joint exam.  Investigation: No additional findings.  Imaging: XR KNEE 3 VIEW RIGHT  Result Date: 06/21/2020 No joint space narrowing was noted.  Moderate patellofemoral narrowing was noted.  No chondrocalcinosis was noted. Impression: These findings are consistent moderate chondromalacia patella.   Recent Labs: Lab Results  Component Value Date   WBC 8.3 02/17/2020   HGB 15.1 02/17/2020   PLT 248.0 02/17/2020   NA 140 02/17/2020   K 4.5 02/17/2020   CL 105 02/17/2020   CO2 28 02/17/2020   GLUCOSE 102 (H) 02/17/2020   BUN 18 02/17/2020   CREATININE 1.19 02/17/2020   BILITOT 0.6 02/17/2020   ALKPHOS 74  02/17/2020   AST 16 02/17/2020   ALT 26 02/17/2020   PROT 6.0 02/17/2020   ALBUMIN 4.2 02/17/2020   CALCIUM 9.6 02/17/2020   GFRAA 75 10/22/2019   QFTBGOLDPLUS NEGATIVE 02/24/2020    Speciality Comments: Prior therapy: Enbrel and Humira (inadequate reponse)  Procedures:  No procedures performed Allergies: Cortisone   Assessment / Plan:     Visit Diagnoses: No diagnosis found.  Orders: No orders of the defined types were placed in this encounter.  No orders of the defined  types were placed in this encounter.   Face-to-face time spent with patient was *** minutes. Greater than 50% of time was spent in counseling and coordination of care.  Follow-Up Instructions: No follow-ups on file.   Earnestine Mealing, CMA  Note - This record has been created using Editor, commissioning.  Chart creation errors have been sought, but may not always  have been located. Such creation errors do not reflect on  the standard of medical care.

## 2020-07-14 NOTE — Telephone Encounter (Signed)
Called to pharmacy 

## 2020-07-14 NOTE — Telephone Encounter (Signed)
Benjamin Fields patient

## 2020-07-14 NOTE — Telephone Encounter (Signed)
OK to send in # 20 tabs one po bid prn  thanks

## 2020-07-14 NOTE — Telephone Encounter (Signed)
Please advise 

## 2020-07-18 NOTE — Progress Notes (Signed)
I reviewed note and agree with plan.   Penni Bombard, MD 12/50/8719, 9:41 PM Certified in Neurology, Neurophysiology and Neuroimaging  [facility] [address], [place],  [address] [phone]

## 2020-07-24 ENCOUNTER — Encounter: Payer: Self-pay | Admitting: Rheumatology

## 2020-07-24 NOTE — Telephone Encounter (Signed)
Please cancel patient's appointment on July 27, 2020.  He will reschedule when he decides to come back.

## 2020-07-26 ENCOUNTER — Telehealth: Payer: Self-pay | Admitting: Radiology

## 2020-07-26 ENCOUNTER — Other Ambulatory Visit: Payer: Self-pay | Admitting: Orthopaedic Surgery

## 2020-07-26 MED ORDER — TRAMADOL HCL 50 MG PO TABS
50.0000 mg | ORAL_TABLET | Freq: Two times a day (BID) | ORAL | 0 refills | Status: DC | PRN
Start: 2020-07-26 — End: 2020-08-11

## 2020-07-26 NOTE — Addendum Note (Signed)
Addended by: Meyer Cory on: 07/26/2020 03:43 PM   Modules accepted: Orders

## 2020-07-26 NOTE — Telephone Encounter (Signed)
Called to pharmacy 

## 2020-07-26 NOTE — Telephone Encounter (Signed)
OK refill thanks 

## 2020-07-26 NOTE — Telephone Encounter (Signed)
Received fax from pharmacy-pt requests refill on tramadol 50mg  tablet, 1 po bid prn #20 with no refills.  Please advise. Insurance claims handler on [employer]

## 2020-07-27 ENCOUNTER — Ambulatory Visit: Payer: BC Managed Care – PPO | Admitting: Rheumatology

## 2020-07-27 DIAGNOSIS — Z79899 Other long term (current) drug therapy: Secondary | ICD-10-CM

## 2020-07-27 DIAGNOSIS — R200 Anesthesia of skin: Secondary | ICD-10-CM

## 2020-07-27 DIAGNOSIS — R0781 Pleurodynia: Secondary | ICD-10-CM

## 2020-07-27 DIAGNOSIS — G8929 Other chronic pain: Secondary | ICD-10-CM

## 2020-07-27 DIAGNOSIS — B360 Pityriasis versicolor: Secondary | ICD-10-CM

## 2020-07-27 DIAGNOSIS — M0609 Rheumatoid arthritis without rheumatoid factor, multiple sites: Secondary | ICD-10-CM

## 2020-07-27 DIAGNOSIS — M5030 Other cervical disc degeneration, unspecified cervical region: Secondary | ICD-10-CM

## 2020-07-27 DIAGNOSIS — I10 Essential (primary) hypertension: Secondary | ICD-10-CM

## 2020-07-27 DIAGNOSIS — M25462 Effusion, left knee: Secondary | ICD-10-CM

## 2020-07-27 DIAGNOSIS — M25461 Effusion, right knee: Secondary | ICD-10-CM

## 2020-07-27 DIAGNOSIS — R5383 Other fatigue: Secondary | ICD-10-CM

## 2020-07-27 DIAGNOSIS — M25411 Effusion, right shoulder: Secondary | ICD-10-CM

## 2020-08-04 ENCOUNTER — Emergency Department (HOSPITAL_BASED_OUTPATIENT_CLINIC_OR_DEPARTMENT_OTHER): Payer: BC Managed Care – PPO

## 2020-08-04 ENCOUNTER — Emergency Department (HOSPITAL_BASED_OUTPATIENT_CLINIC_OR_DEPARTMENT_OTHER)
Admission: EM | Admit: 2020-08-04 | Discharge: 2020-08-04 | Disposition: A | Discharge status: Home / Self Care | Payer: BC Managed Care – PPO | Attending: Emergency Medicine | Admitting: Emergency Medicine

## 2020-08-04 ENCOUNTER — Other Ambulatory Visit: Payer: Self-pay

## 2020-08-04 ENCOUNTER — Encounter (HOSPITAL_BASED_OUTPATIENT_CLINIC_OR_DEPARTMENT_OTHER): Payer: Self-pay | Admitting: Emergency Medicine

## 2020-08-04 DIAGNOSIS — Z79899 Other long term (current) drug therapy: Secondary | ICD-10-CM | POA: Diagnosis not present

## 2020-08-04 DIAGNOSIS — M1711 Unilateral primary osteoarthritis, right knee: Secondary | ICD-10-CM | POA: Diagnosis not present

## 2020-08-04 DIAGNOSIS — I10 Essential (primary) hypertension: Secondary | ICD-10-CM | POA: Insufficient documentation

## 2020-08-04 DIAGNOSIS — M25561 Pain in right knee: Secondary | ICD-10-CM

## 2020-08-04 DIAGNOSIS — M79661 Pain in right lower leg: Secondary | ICD-10-CM | POA: Diagnosis not present

## 2020-08-04 DIAGNOSIS — M7989 Other specified soft tissue disorders: Secondary | ICD-10-CM | POA: Diagnosis not present

## 2020-08-04 DIAGNOSIS — M79604 Pain in right leg: Secondary | ICD-10-CM | POA: Diagnosis not present

## 2020-08-04 DIAGNOSIS — M25461 Effusion, right knee: Secondary | ICD-10-CM | POA: Diagnosis not present

## 2020-08-04 NOTE — Discharge Instructions (Addendum)
Call your primary care doctor or specialist as discussed in the next 2-3 days.   Return immediately back to the ER if:  Your symptoms worsen within the next 12-24 hours. You develop new symptoms such as new fevers, persistent vomiting, new pain, shortness of breath, or new weakness or numbness, or if you have any other concerns.  

## 2020-08-04 NOTE — ED Triage Notes (Signed)
Reports pain in the right leg for the last 2-3 weeks that comes and goes.  Reports it radiates up above and below knee.  Also has swelling in that knee.  Taking tramadol and tylenol for the pain.

## 2020-08-04 NOTE — ED Provider Notes (Signed)
[facility] EMERGENCY DEPARTMENT Provider Note   CSN: [id number] Arrival date & time: 08/04/20  1305     History Chief Complaint  Patient presents with  . Leg Pain    Benjamin Fields is a 59 y.o. male.  Patient presents with a chief complaint of right knee pain.  Describes an achy pain.  Waxes and wanes throughout the last several weeks.  Pain started about 2 or 3 weeks ago.  He denies any falls or trauma.  He states he was diagnosed with rheumatoid arthritis and is following up with his physicians regarding this finding.  He takes tramadol and Tylenol with some improvement.  He presents to the ER as he is concerned this may be a blood clot or some other cause of pain.  Denies fevers cough vomiting or diarrhea.        Past Medical History:  Diagnosis Date  . Allergy    mild- no meds   . Anxiety   . Hypertension   . Neuromuscular disorder ([place])    mild nerve issue right leg   . RA (rheumatoid arthritis) [facility])     Patient Active Problem List   Diagnosis Date Noted  . Low back pain 02/17/2020  . Left hip pain 02/17/2020  . Healthcare maintenance 11/16/2019  . Rheumatoid arthritis of multiple sites with negative rheumatoid factor ([place]) 05/19/2018  . Tinea versicolor 05/19/2018  . Essential hypertension 03/17/2018  . DDD (degenerative disc disease), cervical 03/16/2018    Past Surgical History:  Procedure Laterality Date  . CERVICAL SPINE SURGERY  2010  . COLONOSCOPY    . COLONOSCOPY  04/2020  . TONSILLECTOMY         Family History  Problem Relation Age of Onset  . Parkinson's disease Mother   . Atrial fibrillation Mother   . Heart failure Father   . Hypertension Father   . Prostate cancer Father   . Colon cancer Neg Hx   . Colon polyps Neg Hx   . Esophageal cancer Neg Hx   . Rectal cancer Neg Hx   . Stomach cancer Neg Hx     Social History   Tobacco Use  . Smoking status: Never Smoker  . Smokeless tobacco: Never Used  Vaping Use  .  Vaping Use: Never used  Substance Use Topics  . Alcohol use: Never  . Drug use: Never    Home Medications Prior to Admission medications   Medication Sig Start Date End Date Taking? Authorizing Provider  DULoxetine (CYMBALTA) 60 MG capsule Take 1 capsule (60 mg total) by mouth daily. 07/04/20   Kennyth Arnold, FNP  gabapentin (NEURONTIN) 100 MG capsule Take 1 capsule in am, 1 capsule at lunch and 3 capsules at bedtime. 12/29/19   Lomax, Amy, NP  losartan (COZAAR) 25 MG tablet Take 1 tablet (25 mg total) by mouth daily. for high blood pressure 04/10/20   Libby Maw, MD  Multiple Vitamin (MULTIVITAMIN) tablet Take 1 tablet by mouth daily. OTC    [provider]  traMADol (ULTRAM) 50 MG tablet Take 1 tablet (50 mg total) by mouth 2 (two) times daily as needed. 07/26/20   Marybelle Killings, MD    Allergies    Cortisone  Review of Systems   Review of Systems  Constitutional: Negative for fever.  HENT: Negative for ear pain and sore throat.   Eyes: Negative for pain.  Respiratory: Negative for cough.   Cardiovascular: Negative for chest pain.  Gastrointestinal: Negative  for abdominal pain.  Genitourinary: Negative for flank pain.  Musculoskeletal: Negative for back pain.  Skin: Negative for color change and rash.  Neurological: Negative for syncope.  All other systems reviewed and are negative.   Physical Exam Updated Vital Signs BP 132/83   Pulse (!) 108   Temp 98.4 F (36.9 C) (Oral)   Resp 20   Ht 5\' 10"  (1.778 m)   Wt 81.6 kg   SpO2 99%   BMI 25.83 kg/m   Physical Exam Constitutional:      General: He is not in acute distress.    Appearance: He is well-developed.  HENT:     Head: Normocephalic.     Mouth/Throat:     Mouth: Mucous membranes are moist.  Cardiovascular:     Rate and Rhythm: Normal rate.  Pulmonary:     Effort: Pulmonary effort is normal.  Abdominal:     Palpations: Abdomen is soft.  Musculoskeletal:     Right lower leg: No edema.      Left lower leg: No edema.     Comments: Mild effusion seen on the right knee.  Full range of motion without pain both passive and active.  No lateral or medial laxity noted.  No posterior or anterior laxity noted.  Compartments otherwise soft and neurovascular tact.  Patient is amatory with a nonantalgic gait.  He describes a mild sensation of fullness behind the right calf but no tenderness noted no skin changes noted.  Skin:    General: Skin is warm.     Capillary Refill: Capillary refill takes less than 2 seconds.  Neurological:     General: No focal deficit present.     Mental Status: He is alert.     ED Results / Procedures / Treatments   Labs (all labs ordered are listed, but only abnormal results are displayed) Labs Reviewed - No data to display  EKG None  Radiology DG Knee 2 Views Right  Result Date: 08/04/2020 CLINICAL DATA:  Pain and swelling for 3 weeks. EXAM: RIGHT KNEE - 1-2 VIEW COMPARISON:  06/21/2020 FINDINGS: Two views of the right knee are negative for fracture or dislocation. Normal alignment. Evidence for small suprapatellar joint effusion. Minimal spurring and degenerative changes in the knee compartments. IMPRESSION: 1. No acute bone abnormality to the right knee. 2. Probable small joint effusion. Electronically Signed   By: Markus Daft M.D.   On: 08/04/2020 13:57   US Venous Img Lower Unilateral Right  Result Date: 08/04/2020 CLINICAL DATA:  Right lower extremity pain for the past 3 weeks. Evaluate for DVT. EXAM: RIGHT LOWER EXTREMITY VENOUS DOPPLER ULTRASOUND TECHNIQUE: Gray-scale sonography with graded compression, as well as color Doppler and duplex ultrasound were performed to evaluate the lower extremity deep venous systems from the level of the common femoral vein and including the common femoral, femoral, profunda femoral, popliteal and calf veins including the posterior tibial, peroneal and gastrocnemius veins when visible. The superficial great  saphenous vein was also interrogated. Spectral Doppler was utilized to evaluate flow at rest and with distal augmentation maneuvers in the common femoral, femoral and popliteal veins. COMPARISON:  None. FINDINGS: Contralateral Common Femoral Vein: Respiratory phasicity is normal and symmetric with the symptomatic side. No evidence of thrombus. Normal compressibility. Common Femoral Vein: No evidence of thrombus. Normal compressibility, respiratory phasicity and response to augmentation. Saphenofemoral Junction: No evidence of thrombus. Normal compressibility and flow on color Doppler imaging. Profunda Femoral Vein: No evidence of thrombus. Normal compressibility and  flow on color Doppler imaging. Femoral Vein: No evidence of thrombus. Normal compressibility, respiratory phasicity and response to augmentation. Popliteal Vein: No evidence of thrombus. Normal compressibility, respiratory phasicity and response to augmentation. Calf Veins: No evidence of thrombus. Normal compressibility and flow on color Doppler imaging. Superficial Great Saphenous Vein: No evidence of thrombus. Normal compressibility. Venous Reflux:  None. Other Findings:  None. IMPRESSION: No evidence of DVT within the right lower extremity. Electronically Signed   By: Sandi Mariscal M.D.   On: 08/04/2020 14:08    Procedures Procedures (including critical care time)  Medications Ordered in ED Medications - No data to display  ED Course  I have reviewed the triage vital signs and the nursing notes.  Pertinent labs & imaging results that were available during my care of the patient were reviewed by me and considered in my medical decision making (see chart for details).    MDM Rules/Calculators/A&P                          X-ray imaging negative for any acute fracture dislocation or foreign body.  Ultrasound shows no evidence of DVT.  Patient advised outpatient follow-up with his doctors within 1 week.  Advised immediate return for  worsening symptoms new numbness weakness fevers or any additional concerns.   Final Clinical Impression(s) / ED Diagnoses Final diagnoses:  Right knee pain, unspecified chronicity    Rx / DC Orders ED Discharge Orders    None       Luna Fuse, MD 08/04/20 [phone]

## 2020-08-09 ENCOUNTER — Other Ambulatory Visit: Payer: Self-pay | Admitting: Orthopaedic Surgery

## 2020-08-10 ENCOUNTER — Other Ambulatory Visit: Payer: Self-pay | Admitting: Orthopaedic Surgery

## 2020-08-10 NOTE — Telephone Encounter (Signed)
Please advise 

## 2020-08-11 ENCOUNTER — Telehealth: Payer: Self-pay | Admitting: Orthopaedic Surgery

## 2020-08-11 MED ORDER — TRAMADOL HCL 50 MG PO TABS: 50.0000 mg | ORAL_TABLET | Freq: Two times a day (BID) | ORAL | 0 refills | Status: DC | PRN

## 2020-08-11 NOTE — Telephone Encounter (Signed)
Please advise 

## 2020-08-11 NOTE — Telephone Encounter (Signed)
Called to pharmacy 

## 2020-08-11 NOTE — Telephone Encounter (Signed)
I called. Send in final ultram # 10 tabs for him. Discussed . thanks

## 2020-08-11 NOTE — Telephone Encounter (Signed)
Pt is calling to get more tramadol until his rumenotomy appt.Marland Kitchen He just needs enough for 3 to 4 evenings. [phone] is his number please call him

## 2020-08-11 NOTE — Addendum Note (Signed)
Addended by: Meyer Cory on: 08/11/2020 04:51 PM   Modules accepted: Orders

## 2020-08-16 DIAGNOSIS — M25461 Effusion, right knee: Secondary | ICD-10-CM | POA: Diagnosis not present

## 2020-08-16 DIAGNOSIS — G8929 Other chronic pain: Secondary | ICD-10-CM | POA: Diagnosis not present

## 2020-08-16 DIAGNOSIS — M160 Bilateral primary osteoarthritis of hip: Secondary | ICD-10-CM | POA: Diagnosis not present

## 2020-08-16 DIAGNOSIS — M1990 Unspecified osteoarthritis, unspecified site: Secondary | ICD-10-CM | POA: Diagnosis not present

## 2020-08-16 DIAGNOSIS — M0609 Rheumatoid arthritis without rheumatoid factor, multiple sites: Secondary | ICD-10-CM | POA: Diagnosis not present

## 2020-08-16 DIAGNOSIS — M8949 Other hypertrophic osteoarthropathy, multiple sites: Secondary | ICD-10-CM | POA: Diagnosis not present

## 2020-08-21 ENCOUNTER — Ambulatory Visit: Payer: BC Managed Care – PPO | Admitting: Family Medicine

## 2020-08-23 DIAGNOSIS — M1990 Unspecified osteoarthritis, unspecified site: Secondary | ICD-10-CM | POA: Diagnosis not present

## 2020-09-01 DIAGNOSIS — M0609 Rheumatoid arthritis without rheumatoid factor, multiple sites: Secondary | ICD-10-CM | POA: Diagnosis not present

## 2020-09-09 DIAGNOSIS — M25461 Effusion, right knee: Secondary | ICD-10-CM | POA: Diagnosis not present

## 2020-09-09 DIAGNOSIS — M19011 Primary osteoarthritis, right shoulder: Secondary | ICD-10-CM | POA: Diagnosis not present

## 2020-09-09 DIAGNOSIS — M75111 Incomplete rotator cuff tear or rupture of right shoulder, not specified as traumatic: Secondary | ICD-10-CM | POA: Diagnosis not present

## 2020-09-09 DIAGNOSIS — M8949 Other hypertrophic osteoarthropathy, multiple sites: Secondary | ICD-10-CM | POA: Diagnosis not present

## 2020-09-13 DIAGNOSIS — M4690 Unspecified inflammatory spondylopathy, site unspecified: Secondary | ICD-10-CM | POA: Diagnosis not present

## 2020-09-19 DIAGNOSIS — M25461 Effusion, right knee: Secondary | ICD-10-CM | POA: Diagnosis not present

## 2020-09-19 DIAGNOSIS — M4690 Unspecified inflammatory spondylopathy, site unspecified: Secondary | ICD-10-CM | POA: Diagnosis not present

## 2020-09-20 DIAGNOSIS — M75121 Complete rotator cuff tear or rupture of right shoulder, not specified as traumatic: Secondary | ICD-10-CM | POA: Diagnosis not present

## 2020-09-27 ENCOUNTER — Other Ambulatory Visit (HOSPITAL_COMMUNITY): Payer: Self-pay | Admitting: Immunology

## 2020-10-01 ENCOUNTER — Other Ambulatory Visit: Payer: Self-pay | Admitting: Family

## 2020-10-16 DIAGNOSIS — M75121 Complete rotator cuff tear or rupture of right shoulder, not specified as traumatic: Secondary | ICD-10-CM | POA: Diagnosis not present

## 2020-10-16 DIAGNOSIS — G8918 Other acute postprocedural pain: Secondary | ICD-10-CM | POA: Diagnosis not present

## 2020-10-16 DIAGNOSIS — M24111 Other articular cartilage disorders, right shoulder: Secondary | ICD-10-CM | POA: Diagnosis not present

## 2020-10-16 DIAGNOSIS — M25511 Pain in right shoulder: Secondary | ICD-10-CM | POA: Diagnosis not present

## 2020-10-19 ENCOUNTER — Encounter: Payer: Self-pay | Admitting: Rehabilitative and Restorative Service Providers"

## 2020-10-19 ENCOUNTER — Ambulatory Visit (INDEPENDENT_AMBULATORY_CARE_PROVIDER_SITE_OTHER): Payer: BC Managed Care – PPO | Admitting: Rehabilitative and Restorative Service Providers"

## 2020-10-19 ENCOUNTER — Other Ambulatory Visit: Payer: Self-pay

## 2020-10-19 DIAGNOSIS — R293 Abnormal posture: Secondary | ICD-10-CM | POA: Diagnosis not present

## 2020-10-19 DIAGNOSIS — R600 Localized edema: Secondary | ICD-10-CM

## 2020-10-19 DIAGNOSIS — M25511 Pain in right shoulder: Secondary | ICD-10-CM | POA: Diagnosis not present

## 2020-10-19 DIAGNOSIS — M6281 Muscle weakness (generalized): Secondary | ICD-10-CM | POA: Diagnosis not present

## 2020-10-19 DIAGNOSIS — M25611 Stiffness of right shoulder, not elsewhere classified: Secondary | ICD-10-CM

## 2020-10-19 NOTE — Therapy (Signed)
[facility] [address] [place], [place], [address] Phone: [phone]   Fax:  [phone]  Physical Therapy Evaluation  Patient Details  Name: Benjamin Fields MRN: [id number] Date of Birth: [birth year] Referring Provider (PT): Marybelle Killings   Encounter Date: 10/19/2020   PT End of Session - 10/19/20 1618    Visit Number 1    Number of Visits 30    Date for PT Re-Evaluation 11/24/20    PT Start Time 1430    PT Stop Time 1508    PT Time Calculation (min) 38 min    Activity Tolerance Patient tolerated treatment well;No increased pain    Behavior During Therapy WFL for tasks assessed/performed           Past Medical History:  Diagnosis Date  . Allergy    mild- no meds   . Anxiety   . Hypertension   . Neuromuscular disorder ([place])    mild nerve issue right leg   . RA (rheumatoid arthritis) ([place])     Past Surgical History:  Procedure Laterality Date  . CERVICAL SPINE SURGERY  2010  . COLONOSCOPY    . COLONOSCOPY  04/2020  . TONSILLECTOMY      There were no vitals filed for this visit.    Subjective Assessment - 10/19/20 1516    Subjective Doug had a massive R RTC repair with biceps tenodesis on Monday February 21.  He has little pain and has been in his brace since surgery.    Patient is accompained by: Family member    Pertinent History L RTC tear    Patient Stated Goals Return to normal R shoulder activities (fishing and gardening) without restriction    Currently in Pain? No/denies    Multiple Pain Sites No              [facility] PT Assessment - 10/19/20 0001      Assessment   Medical Diagnosis R RTC    Referring Provider (PT) Rodell Perna C    Onset Date/Surgical Date 10/16/20    Hand Dominance Left    Next MD Visit 10/30/2020      Precautions   Required Braces or Orthoses Sling      Balance Screen   Has the patient fallen in the past 6 months No    Has the patient had a decrease in activity level because of a  fear of falling?  No    Is the patient reluctant to leave their home because of a fear of falling?  No      Home Ecologist residence      Prior Function   Level of Independence Independent    Vocation Full time employment    Press photographer    Leisure Fish, garden      Cognition   Overall Cognitive Status Within Functional Limits for tasks assessed      Observation/Other Assessments   Focus on Therapeutic Outcomes (FOTO)  4 (Goal 54)      ROM / Strength   AROM / PROM / Strength AROM      AROM   Overall AROM  Deficits    AROM Assessment Site Shoulder    Right/Left Shoulder Left;Right    Right Shoulder Flexion 90 Degrees    Right Shoulder Internal Rotation 40 Degrees    Right Shoulder External Rotation 45 Degrees    Left Shoulder Flexion 135 Degrees    Left Shoulder Internal Rotation  40 Degrees    Left Shoulder External Rotation 75 Degrees                      Objective measurements completed on examination: See above findings.       [place] Adult PT Treatment/Exercise - 10/19/20 0001      Therapeutic Activites    Therapeutic Activities Other Therapeutic Activities    Other Therapeutic Activities Education regarding sling, phases of PT and questions regarding care post-surgery      Exercises   Exercises Shoulder      Shoulder Exercises: Standing   Other Standing Exercises Codmans (Forward/Back; CW; CCW) 30X each      Manual Therapy   Manual Therapy Passive ROM    Manual therapy comments IR, ER, flexion    Passive ROM To comfort                  PT Education - 10/19/20 1517    Education Details Talked about proper sling use, sleeping recommendations, phases of PT, expectations and started basic HEP.    Person(s) Educated Patient;Spouse    Methods Explanation;Demonstration;Tactile cues;Verbal cues;Handout    Comprehension Verbal cues required;Need further instruction;Returned  demonstration;Verbalized understanding;Tactile cues required            PT Short Term Goals - 10/19/20 1624      PT SHORT TERM GOAL #1   Title Improve R shoulder PROM to 120 flexion; 50 IR and 60 degrees ER by the end of week 4 post-surgery    Baseline See objective    Time 4    Period Weeks    Status New    Target Date 11/17/20             PT Long Term Goals - 10/19/20 1626      PT LONG TERM GOAL #1   Title Improve self-reported function on FOTO to 54.    Baseline 4    Time 12    Period Weeks    Status New    Target Date 01/12/21      PT LONG TERM GOAL #2   Title Improve R shoulder AROM for flexion to 170; ER to 90; IR to 60 and horizontal adduction to 40 degrees at DC.    Baseline See objective.  90% of the above expected at DC.    Time 12    Period Weeks    Status New    Target Date 01/12/21      PT LONG TERM GOAL #3   Title Improve R shoulder strength as assessed by functional scores and testing.    Baseline Deferred due to 3 days post-surgery.    Time 12    Period Weeks    Status New    Target Date 01/12/21      PT LONG TERM GOAL #4   Title [name] will be independent with his long-term maintenence HEP at DC.    Time 12    Period Weeks    Status New    Target Date 01/12/21                  Plan - 10/19/20 1504    Clinical Impression Statement [name] had a massive RTC repair and biceps tenodesis 10/16/2020.  I recommend gentle passive ROM to reduce the risk of adhesive capsulitis and passive Codmans exercises for the first 4 weeks post-surgery.  Gentle scapular strengthening (supine arm raises and scapular retraction) and AAROM/AROM are recommended weeks  4-8 post-surgery (gradual introduction).  Anti-gravity AROM week 8-10 and gradual RTC strength progressions week 11-12 post-surgery.  Expectations are 90% or better AROM and 60% or better strength 12 weeks post-surgery with 80-90% strength expected 9-12 months post-surgery.    Examination-Activity  Limitations Bathing;Dressing;Hygiene/Grooming;Bed Mobility;Carry;Reach Overhead    Examination-Participation Restrictions Interpersonal Relationship;Occupation;Driving;Community Activity;Cleaning    Stability/Clinical Decision Making Stable/Uncomplicated    Clinical Decision Making Low    Rehab Potential Good    PT Frequency 3x / week    PT Duration Other (comment)   2-3X/week for 12 weeks.  Less frequent if appropriate, 3X/week if needed.   PT Treatment/Interventions ADLs/Self Care Home Management;Moist Heat;Cryotherapy;Therapeutic exercise;Neuromuscular re-education;Patient/family education;Therapeutic activities;Manual techniques;Passive range of motion;Vasopneumatic Device    PT Next Visit Plan PROM to reduce adhesions    PT Home Exercise Plan Access Code: [email or web address]    Consulted and Agree with Plan of Care Patient;Family member/caregiver           Patient will benefit from skilled therapeutic intervention in order to improve the following deficits and impairments:  Decreased endurance,Decreased range of motion,Decreased strength,Increased edema,Impaired flexibility,Impaired UE functional use,Pain  Visit Diagnosis: Abnormal posture  Localized edema  Muscle weakness (generalized)  Acute pain of right shoulder  Stiffness of right shoulder, not elsewhere classified     Problem List Patient Active Problem List   Diagnosis Date Noted  . Low back pain 02/17/2020  . Left hip pain 02/17/2020  . Healthcare maintenance 11/16/2019  . Rheumatoid arthritis of multiple sites with negative rheumatoid factor ([place]) 05/19/2018  . Tinea versicolor 05/19/2018  . Essential hypertension 03/17/2018  . DDD (degenerative disc disease), cervical 03/16/2018    Farley Ly PT, MPT 10/19/2020, 4:30 PM  [facility] [address] [place], [place], [address] Phone: [phone]   Fax:  [phone]  Name: CARLEY STRICKLING MRN: [id number] Date of Birth:  [birth year]

## 2020-10-19 NOTE — Patient Instructions (Signed)
Access Code: [email or web address] URL: [email or web address] Date: 10/19/2020 Prepared by: Vista Mink  Exercises Pendulums - 5 x daily - 7 x weekly - 1 sets - 20-30 reps

## 2020-10-26 ENCOUNTER — Ambulatory Visit (INDEPENDENT_AMBULATORY_CARE_PROVIDER_SITE_OTHER): Payer: BC Managed Care – PPO | Admitting: Rehabilitative and Restorative Service Providers"

## 2020-10-26 ENCOUNTER — Other Ambulatory Visit: Payer: Self-pay

## 2020-10-26 ENCOUNTER — Encounter: Payer: Self-pay | Admitting: Rehabilitative and Restorative Service Providers"

## 2020-10-26 DIAGNOSIS — M6281 Muscle weakness (generalized): Secondary | ICD-10-CM | POA: Diagnosis not present

## 2020-10-26 DIAGNOSIS — R293 Abnormal posture: Secondary | ICD-10-CM | POA: Diagnosis not present

## 2020-10-26 DIAGNOSIS — M25611 Stiffness of right shoulder, not elsewhere classified: Secondary | ICD-10-CM | POA: Diagnosis not present

## 2020-10-26 DIAGNOSIS — M25511 Pain in right shoulder: Secondary | ICD-10-CM | POA: Diagnosis not present

## 2020-10-26 DIAGNOSIS — R600 Localized edema: Secondary | ICD-10-CM

## 2020-10-26 NOTE — Patient Instructions (Signed)
Access Code: [email or web address] URL: [email or web address] Date: 10/26/2020 Prepared by: Vista Mink  Exercises Pendulums - 5 x daily - 7 x weekly - 1 sets - 20-30 reps Standing Scapular Retraction - 5 x daily - 7 x weekly - 1 sets - 5 reps - 5 second hold

## 2020-10-26 NOTE — Therapy (Signed)
[facility] Physical Therapy [address] [place], [place], [address] Phone: [phone]   Fax:  [phone]  Physical Therapy Treatment  Patient Details  Name: Benjamin Fields MRN: [id number] Date of Birth: [birth year] Referring Provider (PT): Marybelle Killings   Encounter Date: 10/26/2020   PT End of Session - 10/26/20 1613    Visit Number 2    Number of Visits 30    Date for PT Re-Evaluation 11/24/20    PT Start Time 1528    PT Stop Time 5465    PT Time Calculation (min) 45 min    Activity Tolerance Patient tolerated treatment well;No increased pain    Behavior During Therapy WFL for tasks assessed/performed           Past Medical History:  Diagnosis Date  . Allergy    mild- no meds   . Anxiety   . Hypertension   . Neuromuscular disorder ([place])    mild nerve issue right leg   . RA (rheumatoid arthritis) ([place])     Past Surgical History:  Procedure Laterality Date  . CERVICAL SPINE SURGERY  2010  . COLONOSCOPY    . COLONOSCOPY  04/2020  . TONSILLECTOMY      There were no vitals filed for this visit.   Subjective Assessment - 10/26/20 1609    Subjective Benjamin Fields has not needed much pain medication since surgery.  He reports 3-4X/Day HEP compliance (5X recommended).    Patient is accompained by: Family member    Pertinent History L RTC tear    Patient Stated Goals Return to normal R shoulder activities (fishing and gardening) without restriction    Currently in Pain? Yes    Pain Score 3     Pain Location Shoulder    Pain Orientation Right    Pain Descriptors / Indicators Aching;Sore;Tightness    Pain Type Surgical pain    Pain Onset 1 to 4 weeks ago    Pain Frequency Intermittent    Aggravating Factors  Sleep    Pain Relieving Factors Ice    Effect of Pain on Daily Activities Needs to be in the sling most of the day    Multiple Pain Sites No              [facility] PT Assessment - 10/26/20 0001      AROM   Left Shoulder Flexion 120 Degrees     Left Shoulder Internal Rotation 50 Degrees    Left Shoulder External Rotation 70 Degrees                         [facility] Adult PT Treatment/Exercise - 10/26/20 0001      Exercises   Exercises Shoulder      Shoulder Exercises: Standing   Retraction Strengthening;Both;10 reps;Other (comment)   5 seconds shoulder blade pinches   Other Standing Exercises Codmans (Forward/Back; CW; CCW) 30X each      Manual Therapy   Manual Therapy Passive ROM    Manual therapy comments IR, ER, flexion    Passive ROM To comfort                  PT Education - 10/26/20 1611    Education Details 5X/Day HEP, added new scapular retraction exercises, reviewed instructions to avoid overuse.    Person(s) Educated Patient    Methods Explanation;Demonstration;Tactile cues;Verbal cues;Handout    Comprehension Verbal cues required;Need further instruction;Returned demonstration;Verbalized understanding;Tactile cues required  PT Short Term Goals - 10/26/20 1612      PT SHORT TERM GOAL #1   Title Improve R shoulder PROM to 120 flexion; 50 IR and 60 degrees ER by the end of week 4 post-surgery    Baseline See objective    Time 4    Period Weeks    Status Achieved    Target Date 11/17/20             PT Long Term Goals - 10/26/20 1613      PT LONG TERM GOAL #1   Title Improve self-reported function on FOTO to 54.    Baseline 4    Time 12    Period Weeks    Status On-going      PT LONG TERM GOAL #2   Title Improve R shoulder AROM for flexion to 170; ER to 90; IR to 60 and horizontal adduction to 40 degrees at DC.    Baseline See objective.  90% of the above expected at DC.    Time 12    Period Weeks    Status On-going      PT LONG TERM GOAL #3   Title Improve R shoulder strength as assessed by functional scores and testing.    Baseline Deferred due to 3 days post-surgery.    Time 12    Period Weeks    Status On-going      PT LONG TERM GOAL #4   Title  Benjamin Fields will be independent with his long-term maintenence HEP at DC.    Time 12    Period Weeks    Status On-going                 Plan - 10/26/20 1620    Clinical Impression Statement Benjamin Fields had a massive RTC repair and biceps tenodesis 10/16/2020.  PROM and gentle capsular stretching is the emphasis at least until he follows up with his surgeon 10/30/2020.  4 weeks of PROM is likely with gentle AAROM and AROM expected 4 weeks post-surgery.    Examination-Activity Limitations Bathing;Dressing;Hygiene/Grooming;Bed Mobility;Carry;Reach Overhead    Examination-Participation Restrictions Interpersonal Relationship;Occupation;Driving;Community Activity;Cleaning    Stability/Clinical Decision Making Stable/Uncomplicated    Rehab Potential Good    PT Frequency 3x / week    PT Duration Other (comment)   2-3X/week for 12 weeks.  Less frequent if appropriate, 3X/week if needed.   PT Treatment/Interventions ADLs/Self Care Home Management;Moist Heat;Cryotherapy;Therapeutic exercise;Neuromuscular re-education;Patient/family education;Therapeutic activities;Manual techniques;Passive range of motion;Vasopneumatic Device    PT Next Visit Plan PROM to reduce adhesions    PT Home Exercise Plan Access Code: [email or web address]    Consulted and Agree with Plan of Care Patient;Family member/caregiver           Patient will benefit from skilled therapeutic intervention in order to improve the following deficits and impairments:  Decreased endurance,Decreased range of motion,Decreased strength,Increased edema,Impaired flexibility,Impaired UE functional use,Pain  Visit Diagnosis: Abnormal posture  Muscle weakness (generalized)  Acute pain of right shoulder  Stiffness of right shoulder, not elsewhere classified  Localized edema     Problem List Patient Active Problem List   Diagnosis Date Noted  . Low back pain 02/17/2020  . Left hip pain 02/17/2020  . Healthcare maintenance 11/16/2019  . Rheumatoid  arthritis of multiple sites with negative rheumatoid factor ([place]) 05/19/2018  . Tinea versicolor 05/19/2018  . Essential hypertension 03/17/2018  . DDD (degenerative disc disease), cervical 03/16/2018    Farley Ly PT, MPT 10/26/2020, 4:22 PM  [facility] Physical Therapy [address] [place], [place], [address] Phone: [phone]   Fax:  [phone]  Name: Benjamin Fields MRN: [id number] Date of Birth: 28-May-1961

## 2020-10-30 DIAGNOSIS — M75121 Complete rotator cuff tear or rupture of right shoulder, not specified as traumatic: Secondary | ICD-10-CM | POA: Diagnosis not present

## 2020-10-31 ENCOUNTER — Encounter: Payer: BC Managed Care – PPO | Admitting: Rehabilitative and Restorative Service Providers"

## 2020-11-02 ENCOUNTER — Other Ambulatory Visit (HOSPITAL_COMMUNITY): Payer: Self-pay | Admitting: Immunology

## 2020-11-02 ENCOUNTER — Encounter: Payer: BC Managed Care – PPO | Admitting: Rehabilitative and Restorative Service Providers"

## 2020-11-06 ENCOUNTER — Encounter: Payer: BC Managed Care – PPO | Admitting: Rehabilitative and Restorative Service Providers"

## 2020-11-07 ENCOUNTER — Encounter: Payer: Self-pay | Admitting: Family Medicine

## 2020-11-08 MED ORDER — GABAPENTIN 100 MG PO CAPS: ORAL_CAPSULE | ORAL | 3 refills | Status: DC

## 2020-11-09 ENCOUNTER — Encounter: Payer: Self-pay | Admitting: Rehabilitative and Restorative Service Providers"

## 2020-11-09 ENCOUNTER — Ambulatory Visit (INDEPENDENT_AMBULATORY_CARE_PROVIDER_SITE_OTHER): Payer: BC Managed Care – PPO | Admitting: Rehabilitative and Restorative Service Providers"

## 2020-11-09 ENCOUNTER — Other Ambulatory Visit: Payer: Self-pay

## 2020-11-09 DIAGNOSIS — R600 Localized edema: Secondary | ICD-10-CM

## 2020-11-09 DIAGNOSIS — M25611 Stiffness of right shoulder, not elsewhere classified: Secondary | ICD-10-CM

## 2020-11-09 DIAGNOSIS — M25511 Pain in right shoulder: Secondary | ICD-10-CM

## 2020-11-09 DIAGNOSIS — M6281 Muscle weakness (generalized): Secondary | ICD-10-CM

## 2020-11-09 DIAGNOSIS — R293 Abnormal posture: Secondary | ICD-10-CM | POA: Diagnosis not present

## 2020-11-09 NOTE — Therapy (Signed)
[facility] Physical Therapy [address] [place], [place], [address] Phone: [phone]   Fax:  [phone]  Physical Therapy Treatment  Patient Details  Name: Benjamin Fields MRN: [id number] Date of Birth: 04-Feb-1961 Referring Provider (PT): Benjamin Fields   Encounter Date: 11/09/2020   PT End of Session - 11/09/20 1601    Visit Number 3    Number of Visits 30    Date for PT Re-Evaluation 11/24/20    PT Start Time 3810    PT Stop Time 1751    PT Time Calculation (min) 39 min    Activity Tolerance Patient tolerated treatment well;No increased pain    Behavior During Therapy WFL for tasks assessed/performed           Past Medical History:  Diagnosis Date  . Allergy    mild- no meds   . Anxiety   . Hypertension   . Neuromuscular disorder ([place])    mild nerve issue right leg   . RA (rheumatoid arthritis) ([place])     Past Surgical History:  Procedure Laterality Date  . CERVICAL SPINE SURGERY  2010  . COLONOSCOPY    . COLONOSCOPY  04/2020  . TONSILLECTOMY      There were no vitals filed for this visit.   Subjective Assessment - 11/09/20 1548    Subjective 4-5X/Day HEP compliance.  Benjamin Fields was able to sleep 6 hours uninterrupted last night.    Patient is accompained by: Family member    Pertinent History L RTC tear    Patient Stated Goals Return to normal R shoulder activities (fishing and gardening) without restriction    Currently in Pain? Yes    Pain Score 0-No pain    Pain Onset 1 to 4 weeks ago    Aggravating Factors  Sleep    Pain Relieving Factors Ice    Effect of Pain on Daily Activities Uses sling most all day long    Multiple Pain Sites No                             [facility] Adult PT Treatment/Exercise - 11/09/20 0001      Exercises   Exercises Shoulder      Shoulder Exercises: Standing   Retraction Strengthening;Both;10 reps;Other (comment)   5 seconds shoulder blade pinches   Other Standing Exercises Codmans  (Forward/Back; CW; CCW) 30X each      Manual Therapy   Manual Therapy Passive ROM    Manual therapy comments IR, ER, flexion    Passive ROM To comfort                  PT Education - 11/09/20 1559    Education Details Reviewed MD protocol together and HEP.  Encouraged continued sling compliance.    Person(s) Educated Patient    Methods Explanation;Tactile cues;Verbal cues    Comprehension Verbalized understanding;Tactile cues required;Returned demonstration;Verbal cues required            PT Short Term Goals - 11/09/20 1600      PT SHORT TERM GOAL #1   Title Improve R shoulder PROM to 120 flexion; 50 IR and 60 degrees ER by the end of week 4 post-surgery    Baseline See objective    Time 4    Period Weeks    Status Achieved    Target Date 11/17/20             PT Long Term  Goals - 11/09/20 1600      PT LONG TERM GOAL #1   Title Improve self-reported function on FOTO to 54.    Baseline 4    Time 12    Period Weeks    Status On-going      PT LONG TERM GOAL #2   Title Improve R shoulder AROM for flexion to 170; ER to 90; IR to 60 and horizontal adduction to 40 degrees at DC.    Baseline See objective.  90% of the above expected at DC.    Time 12    Period Weeks    Status On-going      PT LONG TERM GOAL #3   Title Improve R shoulder strength as assessed by functional scores and testing.    Baseline Deferred due to 3 days post-surgery.    Time 12    Period Weeks    Status On-going      PT LONG TERM GOAL #4   Title Benjamin Fields will be independent with his long-term maintenence HEP at DC.    Time 12    Period Weeks    Status On-going                 Plan - 11/09/20 1601    Clinical Impression Statement PROM remains the emphasis with limitations per protocol due to massive R RTC repair with biceps tenodesis.  Sleep is starting to improve.  Continue very conservative protocol to meet long-term goals.    Examination-Activity Limitations  Bathing;Dressing;Hygiene/Grooming;Bed Mobility;Carry;Reach Overhead    Examination-Participation Restrictions Interpersonal Relationship;Occupation;Driving;Community Activity;Cleaning    Stability/Clinical Decision Making Stable/Uncomplicated    Rehab Potential Good    PT Frequency 3x / week    PT Duration Other (comment)   2-3X/week for 12 weeks.  Less frequent if appropriate, 3X/week if needed.   PT Treatment/Interventions ADLs/Self Care Home Management;Moist Heat;Cryotherapy;Therapeutic exercise;Neuromuscular re-education;Patient/family education;Therapeutic activities;Manual techniques;Passive range of motion;Vasopneumatic Device    PT Next Visit Plan PROM to reduce adhesions    PT Home Exercise Plan Access Code: [email or web address]    Consulted and Agree with Plan of Care Patient;Family member/caregiver           Patient will benefit from skilled therapeutic intervention in order to improve the following deficits and impairments:  Decreased endurance,Decreased range of motion,Decreased strength,Increased edema,Impaired flexibility,Impaired UE functional use,Pain  Visit Diagnosis: Abnormal posture  Muscle weakness (generalized)  Acute pain of right shoulder  Stiffness of right shoulder, not elsewhere classified  Localized edema     Problem List Patient Active Problem List   Diagnosis Date Noted  . Low back pain 02/17/2020  . Left hip pain 02/17/2020  . Healthcare maintenance 11/16/2019  . Rheumatoid arthritis of multiple sites with negative rheumatoid factor ([place]) 05/19/2018  . Tinea versicolor 05/19/2018  . Essential hypertension 03/17/2018  . DDD (degenerative disc disease), cervical 03/16/2018    Benjamin Fields PT, MPT 11/09/2020, 4:04 PM  [facility] Physical Therapy [address] [place], [place], [address] Phone: [phone]   Fax:  [phone]  Name: Benjamin Fields MRN: [id number] Date of Birth: [birth year]

## 2020-11-12 ENCOUNTER — Other Ambulatory Visit: Payer: Self-pay | Admitting: Family

## 2020-11-13 ENCOUNTER — Encounter: Payer: Self-pay | Admitting: Rehabilitative and Restorative Service Providers"

## 2020-11-13 ENCOUNTER — Other Ambulatory Visit: Payer: Self-pay

## 2020-11-13 ENCOUNTER — Ambulatory Visit (INDEPENDENT_AMBULATORY_CARE_PROVIDER_SITE_OTHER): Payer: BC Managed Care – PPO | Admitting: Rehabilitative and Restorative Service Providers"

## 2020-11-13 DIAGNOSIS — M25611 Stiffness of right shoulder, not elsewhere classified: Secondary | ICD-10-CM | POA: Diagnosis not present

## 2020-11-13 DIAGNOSIS — R293 Abnormal posture: Secondary | ICD-10-CM | POA: Diagnosis not present

## 2020-11-13 DIAGNOSIS — M25511 Pain in right shoulder: Secondary | ICD-10-CM | POA: Diagnosis not present

## 2020-11-13 DIAGNOSIS — M6281 Muscle weakness (generalized): Secondary | ICD-10-CM | POA: Diagnosis not present

## 2020-11-13 DIAGNOSIS — R600 Localized edema: Secondary | ICD-10-CM

## 2020-11-13 NOTE — Therapy (Signed)
Benjamin Fields, [place], [address] Phone: [phone]   Fax:  [phone]  Physical Therapy Treatment  Patient Details  Name: Benjamin Fields MRN: [id number] Date of Birth: 09-09-1960 Referring Provider (PT): Klifto, Benjamin Katos, MD   Encounter Date: 11/13/2020   PT End of Session - 11/13/20 1520    Visit Number 4    Number of Visits 30    Date for PT Re-Evaluation 11/24/20    Progress Note Due on Visit 10    PT Start Time 2703    PT Stop Time 1550    PT Time Calculation (min) 38 min    Activity Tolerance Patient tolerated treatment well;No increased pain    Behavior During Therapy WFL for tasks assessed/performed           Past Medical History:  Diagnosis Date  . Allergy    mild- no meds   . Anxiety   . Hypertension   . Neuromuscular disorder ([place])    mild nerve issue right leg   . RA (rheumatoid arthritis) ([place])     Past Surgical History:  Procedure Laterality Date  . CERVICAL SPINE SURGERY  2010  . COLONOSCOPY    . COLONOSCOPY  04/2020  . TONSILLECTOMY      There were no vitals filed for this visit.   Subjective Assessment - 11/13/20 1513    Subjective Pt. stated doing feeling fairly well.  Pt. indicated he called MD office about sling (4-6 weeks with some weaning).  Wearing at night and when sore.    Patient is accompained by: Family member    Pertinent History Lt RTC tear    Patient Stated Goals Return to normal R shoulder activities (fishing and gardening) without restriction    Currently in Pain? No/denies    Pain Score 0-No pain    Pain Location Shoulder    Pain Orientation Right    Pain Descriptors / Indicators Aching;Sore;Tightness    Pain Type Surgical pain    Pain Onset More than a month ago    Pain Frequency Intermittent    Aggravating Factors  sometimes sore with exercises, sleeping at times    Pain Relieving Factors rest, sling comfort              [facility] PT Assessment -  11/13/20 0001      Assessment   Medical Diagnosis Rt RTC    Referring Provider (PT) Klifto, Benjamin Katos, MD    Onset Date/Surgical Date 10/16/20    Hand Dominance Left      ROM / Strength   AROM / PROM / Strength PROM;Strength      AROM   Overall AROM Comments Protocol stated no AROM until 12/12/2020 (8 weeks post op for massive repairs)      PROM   PROM Assessment Site Shoulder    Right/Left Shoulder Right    Right Shoulder Flexion 90 Degrees   limited by protocol, no pain   Right Shoulder External Rotation 30 Degrees   limited by protocol, mild tightness reported                        [facility] Adult PT Treatment/Exercise - 11/14/20 0001      Exercises   Exercises Other Exercises    Other Exercises  HEP reviewed (pendulums) c additional cues for new exercises.                    PT  Short Term Goals - 11/13/20 1602      PT SHORT TERM GOAL #1   Title Improve R shoulder PROM to 120 flexion; 50 IR and 60 degrees ER by the end of week 4 post-surgery    Time 4    Period Weeks    Status Unable to assess   initial goal did not adhere to protocol provided by MD office after initial evaluation   Target Date 11/17/20      PT SHORT TERM GOAL #2   Title Improve R shoulder PROM to 90 flexion; 50 IR and 30 degrees ER by the end of week 4 post-surgery    Time 1    Status Achieved    Target Date 11/20/20             PT Long Term Goals - 11/09/20 1600      PT LONG TERM GOAL #1   Title Improve self-reported function on FOTO to 54.    Baseline 4    Time 12    Period Weeks    Status On-going      PT LONG TERM GOAL #2   Title Improve R shoulder AROM for flexion to 170; ER to 90; IR to 60 and horizontal adduction to 40 degrees at DC.    Baseline See objective.  90% of the above expected at DC.    Time 12    Period Weeks    Status On-going      PT LONG TERM GOAL #3   Title Improve R shoulder strength as assessed by functional scores and testing.     Baseline Deferred due to 3 days post-surgery.    Time 12    Period Weeks    Status On-going      PT LONG TERM GOAL #4   Title [name] will be independent with his long-term maintenence HEP at DC.    Time 12    Period Weeks    Status On-going                 Plan - 11/13/20 1534    Clinical Impression Statement Protocol based movements allowed today passively to 90 deg flexion, er to 30 degrees.  Pt. is able to achieve movements c minimal restriction at this time.  Added passive stretching in supine for each movement matching protocol changes at 4 weeks.    Examination-Activity Limitations Bathing;Dressing;Hygiene/Grooming;Bed Mobility;Carry;Reach Overhead    Examination-Participation Restrictions Interpersonal Relationship;Occupation;Driving;Community Activity;Cleaning    Stability/Clinical Decision Making Stable/Uncomplicated    Rehab Potential Good    PT Frequency --   adjust 1x/week untli strengthening, then 2x may be appropraite again   PT Duration 12 weeks    PT Treatment/Interventions ADLs/Self Care Home Management;Moist Heat;Cryotherapy;Therapeutic exercise;Neuromuscular re-education;Patient/family education;Therapeutic activities;Manual techniques;Passive range of motion;Vasopneumatic Device    PT Next Visit Plan PROM to reduce adhesions    PT Home Exercise Plan Access Code: [email or web address]    Consulted and Agree with Plan of Care Patient;Family member/caregiver           Patient will benefit from skilled therapeutic intervention in order to improve the following deficits and impairments:  Decreased endurance,Decreased range of motion,Decreased strength,Increased edema,Impaired flexibility,Impaired UE functional use,Pain  Visit Diagnosis: Acute pain of right shoulder  Stiffness of right shoulder, not elsewhere classified  Muscle weakness (generalized)  Abnormal posture  Localized edema     Problem List Patient Active Problem List   Diagnosis Date Noted  . Low  back pain 02/17/2020  . Left  hip pain 02/17/2020  . Healthcare maintenance 11/16/2019  . Rheumatoid arthritis of multiple sites with negative rheumatoid factor ([place]) 05/19/2018  . Tinea versicolor 05/19/2018  . Essential hypertension 03/17/2018  . DDD (degenerative disc disease), cervical 03/16/2018    Scot Jun, PT, DPT, OCS, ATC 11/14/20  7:56 AM    [facility] Physical Therapy [address] [place], [place], [address] Phone: [phone]   Fax:  [phone]  Name: Benjamin Fields MRN: [id number] Date of Birth: [birth year]

## 2020-11-14 NOTE — Patient Instructions (Signed)
Access Code: [email or web address] URL: [email or web address] Date: 11/14/2020 Prepared by: Scot Jun  Exercises Pendulums - 5 x daily - 7 x weekly - 1 sets - 20-30 reps Standing Scapular Retraction - 5 x daily - 7 x weekly - 1 sets - 5 reps - 5 second hold Supine Shoulder External Rotation with [place] at 20 Degrees of Abduction - 2-3 x daily - 7 x weekly - 1 sets - 10 reps - 10 hold Supported Elbow Flexion Extension PROM - 2-3 x daily - 7 x weekly - 3 sets - 10 reps

## 2020-11-16 ENCOUNTER — Encounter: Payer: BC Managed Care – PPO | Admitting: Rehabilitative and Restorative Service Providers"

## 2020-11-17 NOTE — Addendum Note (Signed)
Addended by: Maricela Bo on: 11/17/2020 11:46 AM   Modules accepted: Orders

## 2020-11-20 ENCOUNTER — Ambulatory Visit (INDEPENDENT_AMBULATORY_CARE_PROVIDER_SITE_OTHER): Payer: BC Managed Care – PPO | Admitting: Rehabilitative and Restorative Service Providers"

## 2020-11-20 ENCOUNTER — Other Ambulatory Visit: Payer: Self-pay

## 2020-11-20 DIAGNOSIS — M6281 Muscle weakness (generalized): Secondary | ICD-10-CM | POA: Diagnosis not present

## 2020-11-20 DIAGNOSIS — M25611 Stiffness of right shoulder, not elsewhere classified: Secondary | ICD-10-CM | POA: Diagnosis not present

## 2020-11-20 DIAGNOSIS — R293 Abnormal posture: Secondary | ICD-10-CM | POA: Diagnosis not present

## 2020-11-20 DIAGNOSIS — M25511 Pain in right shoulder: Secondary | ICD-10-CM

## 2020-11-20 DIAGNOSIS — R600 Localized edema: Secondary | ICD-10-CM

## 2020-11-20 NOTE — Therapy (Signed)
[facility] [place], [place], [address] Phone: [phone]   Fax:  [phone]  Physical Therapy Treatment/Progress note  Patient Details  Name: Benjamin Fields MRN: [id number] Date of Birth: [birth year] Referring Provider (PT): Klifto, Merwyn Katos, MD   Encounter Date: 11/20/2020   Progress Note Reporting Period 10/18/2020 to 11/20/2020  See note below for Objective Data and Assessment of Progress/Goals.        PT End of Session - 11/20/20 1454    Visit Number 5    Number of Visits 30    Date for PT Re-Evaluation 01/12/21    Progress Note Due on Visit 15    PT Start Time 1429    PT Stop Time 2694    PT Time Calculation (min) 24 min    Activity Tolerance Patient tolerated treatment well;No increased pain    Behavior During Therapy WFL for tasks assessed/performed           Past Medical History:  Diagnosis Date  . Allergy    mild- no meds   . Anxiety   . Hypertension   . Neuromuscular disorder ([place])    mild nerve issue right leg   . RA (rheumatoid arthritis) ([place])     Past Surgical History:  Procedure Laterality Date  . CERVICAL SPINE SURGERY  2010  . COLONOSCOPY    . COLONOSCOPY  04/2020  . TONSILLECTOMY      There were no vitals filed for this visit.   Subjective Assessment - 11/20/20 1434    Subjective Pt. indicated no pain upon arrival today.  Pt. stated he felt the exercises were helpful but did have some pain complaints occasional.  Symptoms better with ice.    Patient is accompained by: Family member    Pertinent History Lt RTC tear    Patient Stated Goals Return to normal R shoulder activities (fishing and gardening) without restriction    Currently in Pain? No/denies    Pain Onset More than a month ago              [facility] PT Assessment - 11/20/20 0001      Assessment   Medical Diagnosis Rt RTC    Referring Provider (PT) Klifto, Merwyn Katos, MD    Onset Date/Surgical Date  10/16/20    Hand Dominance Left      Precautions   Precaution Comments Protocol progress s/p 6 weeks from surgery to flexion 0-120 degrees, er 0-45 degrees in scapular plane.  NO PULLEYS/CANE AAROM, cross body/behind back movement. No active use of Rt UE/no WB      PROM   Right Shoulder Flexion 90 Degrees    Right Shoulder External Rotation 30 Degrees                         [facility] Adult PT Treatment/Exercise - 11/20/20 0001      Neuro Re-ed    Neuro Re-ed Details  rhymtic stabilizations scapula retraction, protraction, elevation, depression c verbal/tactile cues      Exercises   Other Exercises  HEP reviewed again c cues for various positioning for scapular retraction for added gravity resistance (prone).      Shoulder Exercises: Supine   Other Supine Exercises Reviewed supine wand ER to 30 deg, flexion supported io 90 deg      Shoulder Exercises: Prone   Other Prone Exercises scapular retraction 5 sec hold 2 x 10 c head off table  Manual Therapy   Manual therapy comments PROM all directions within protoco(flexion to 90, ER to 30 deg)l, g2 jt mobs                    PT Short Term Goals - 11/13/20 1602      PT SHORT TERM GOAL #1   Title Improve R shoulder PROM to 120 flexion; 50 IR and 60 degrees ER by the end of week 4 post-surgery    Time 4    Period Weeks    Status Unable to assess   initial goal did not adhere to protocol provided by MD office after initial evaluation   Target Date 11/17/20      PT SHORT TERM GOAL #2   Title Improve R shoulder PROM to 90 flexion; 50 IR and 30 degrees ER by the end of week 4 post-surgery    Time 1    Status Achieved    Target Date 11/20/20             PT Long Term Goals - 11/09/20 1600      PT LONG TERM GOAL #1   Title Improve self-reported function on FOTO to 54.    Baseline 4    Time 12    Period Weeks    Status On-going      PT LONG TERM GOAL #2   Title Improve R shoulder AROM for flexion  to 170; ER to 90; IR to 60 and horizontal adduction to 40 degrees at DC.    Baseline See objective.  90% of the above expected at DC.    Time 12    Period Weeks    Status On-going      PT LONG TERM GOAL #3   Title Improve R shoulder strength as assessed by functional scores and testing.    Baseline Deferred due to 3 days post-surgery.    Time 12    Period Weeks    Status On-going      PT LONG TERM GOAL #4   Title [name] will be independent with his long-term maintenence HEP at DC.    Time 12    Period Weeks    Status On-going                 Plan - 11/20/20 1432    Clinical Impression Statement Pt. is approx. 5 weeks post surgery today.  Pt. continued to demonstrate ability to reach established protocol allowed ROM without pain complaints or connective tissue resistance.  Good knowledge of HEP at this time.  Pt. to progress next visit to passive flexion 0-120 deg and ER passive to 45 deg per protocol.    Examination-Activity Limitations Bathing;Dressing;Hygiene/Grooming;Bed Mobility;Carry;Reach Overhead    Examination-Participation Restrictions Interpersonal Relationship;Occupation;Driving;Community Activity;Cleaning    Stability/Clinical Decision Making Stable/Uncomplicated    Rehab Potential Good    PT Frequency --   adjust 1x/week untli strengthening, then 2x may be appropriate again   PT Duration 12 weeks    PT Treatment/Interventions ADLs/Self Care Home Management;Moist Heat;Cryotherapy;Therapeutic exercise;Neuromuscular re-education;Patient/family education;Therapeutic activities;Manual techniques;Passive range of motion;Vasopneumatic Device    PT Next Visit Plan Protocol progress s/p 6 weeks from surgery to flexion 0-120 degrees, er 0-45 degrees in scapular plane.  NO PULLEYS/CANE AAROM, cross body/behind back movement. No active use of Rt UE/no WB    PT Home Exercise Plan Access Code: [email or web address]    Consulted and Agree with Plan of Care Patient  Patient will  benefit from skilled therapeutic intervention in order to improve the following deficits and impairments:  Decreased endurance,Decreased range of motion,Decreased strength,Increased edema,Impaired flexibility,Impaired UE functional use,Pain  Visit Diagnosis: Acute pain of right shoulder  Stiffness of right shoulder, not elsewhere classified  Muscle weakness (generalized)  Abnormal posture  Localized edema     Problem List Patient Active Problem List   Diagnosis Date Noted  . Low back pain 02/17/2020  . Left hip pain 02/17/2020  . Healthcare maintenance 11/16/2019  . Rheumatoid arthritis of multiple sites with negative rheumatoid factor ([place]) 05/19/2018  . Tinea versicolor 05/19/2018  . Essential hypertension 03/17/2018  . DDD (degenerative disc disease), cervical 03/16/2018   Scot Jun, PT, DPT, OCS, ATC 11/20/20  3:02 PM    [facility] [address] [place], [place], [address] Phone: [phone]   Fax:  [phone]  Name: IVON OELKERS MRN: [id number] Date of Birth: [birth year]

## 2020-11-21 ENCOUNTER — Other Ambulatory Visit (HOSPITAL_COMMUNITY): Payer: Self-pay

## 2020-11-23 ENCOUNTER — Encounter: Payer: BC Managed Care – PPO | Admitting: Rehabilitative and Restorative Service Providers"

## 2020-11-27 ENCOUNTER — Other Ambulatory Visit (HOSPITAL_COMMUNITY): Payer: Self-pay

## 2020-11-27 MED ORDER — COSENTYX SENSOREADY (300 MG) 150 MG/ML ~~LOC~~ SOAJ
SUBCUTANEOUS | 3 refills | Status: DC
  Filled 2020-11-30 – 2020-12-04 (×2): qty 2, 28d supply, fill #0
  Filled 2020-12-26 (×2): qty 2, 28d supply, fill #1
  Filled 2021-02-06: qty 2, 28d supply, fill #2
  Filled 2021-03-08: qty 2, 28d supply, fill #3
  Filled 2021-04-20: qty 2, 28d supply, fill #4
  Filled 2021-05-16: qty 2, 28d supply, fill #5
  Filled 2021-06-08: qty 2, 28d supply, fill #6
  Filled 2021-07-10: qty 2, 28d supply, fill #7
  Filled 2021-08-01: qty 2, 28d supply, fill #8

## 2020-11-29 ENCOUNTER — Ambulatory Visit: Payer: BC Managed Care – PPO | Admitting: Rehabilitative and Restorative Service Providers"

## 2020-11-29 ENCOUNTER — Other Ambulatory Visit: Payer: Self-pay

## 2020-11-29 ENCOUNTER — Encounter: Payer: Self-pay | Admitting: Rehabilitative and Restorative Service Providers"

## 2020-11-29 DIAGNOSIS — M6281 Muscle weakness (generalized): Secondary | ICD-10-CM

## 2020-11-29 DIAGNOSIS — M25611 Stiffness of right shoulder, not elsewhere classified: Secondary | ICD-10-CM | POA: Diagnosis not present

## 2020-11-29 DIAGNOSIS — R293 Abnormal posture: Secondary | ICD-10-CM

## 2020-11-29 DIAGNOSIS — R600 Localized edema: Secondary | ICD-10-CM | POA: Diagnosis not present

## 2020-11-29 DIAGNOSIS — M25511 Pain in right shoulder: Secondary | ICD-10-CM

## 2020-11-29 NOTE — Patient Instructions (Signed)
Access Code: [email or web address] URL: [email or web address] Date: 11/29/2020 Prepared by: Vista Mink  Exercises Pendulums - 5 x daily - 7 x weekly - 1 sets - 20-30 reps Standing Scapular Retraction - 5 x daily - 7 x weekly - 1 sets - 5 reps - 5 second hold Supine Shoulder External Rotation with [place] at 20 Degrees of Abduction - 2-3 x daily - 7 x weekly - 1 sets - 10 reps - 10 hold Supported Elbow Flexion Extension PROM - 2-3 x daily - 7 x weekly - 3 sets - 10 reps Supine Shoulder Internal Rotation Stretch - 1-2 x daily - 7 x weekly - 1 sets - 10-15 reps - 10 secondsinutes hold Supine Scapular Protraction in Flexion with Dumbbells - 1-2 x daily - 7 x weekly - 1 sets - 20 reps - 3 seconds hold

## 2020-11-29 NOTE — Therapy (Signed)
[facility] [place], [place], [address] Phone: [phone]   Fax:  [phone]  Physical Therapy Treatment  Patient Details  Name: GHAZI RUMPF MRN: [id number] Date of Birth: 16-Jan-1961 Referring Provider (PT): Klifto, Merwyn Katos, MD   Encounter Date: 11/29/2020   PT End of Session - 11/29/20 1510    Visit Number 6    Number of Visits 30    Date for PT Re-Evaluation 01/12/21    Progress Note Due on Visit 15    PT Start Time 1418    PT Stop Time 1500    PT Time Calculation (min) 42 min    Activity Tolerance Patient tolerated treatment well;No increased pain    Behavior During Therapy WFL for tasks assessed/performed           Past Medical History:  Diagnosis Date  . Allergy    mild- no meds   . Anxiety   . Hypertension   . Neuromuscular disorder ([place])    mild nerve issue right leg   . RA (rheumatoid arthritis) ([place])     Past Surgical History:  Procedure Laterality Date  . CERVICAL SPINE SURGERY  2010  . COLONOSCOPY    . COLONOSCOPY  04/2020  . TONSILLECTOMY      There were no vitals filed for this visit.   Subjective Assessment - 11/29/20 1506    Subjective Doug reports good sleep.  No pain.  Following post-surgical precautions.    Patient is accompained by: Family member    Pertinent History Lt RTC tear    Patient Stated Goals Return to normal R shoulder activities (fishing and gardening) without restriction    Currently in Pain? No/denies    Pain Onset More than a month ago              [facility] PT Assessment - 11/29/20 0001      ROM / Strength   AROM / PROM / Strength AROM      AROM   Overall AROM  Deficits    AROM Assessment Site Shoulder    Right/Left Shoulder Right    Right Shoulder Flexion 125 Degrees    Right Shoulder Internal Rotation 40 Degrees    Right Shoulder External Rotation 70 Degrees   Limited passively to 30 degrees   Right Shoulder Horizontal  ADduction 20 Degrees    Limited passively to 0                        [facility] Adult PT Treatment/Exercise - 11/29/20 0001      Therapeutic Activites    Therapeutic Activities Other Therapeutic Activities    Other Therapeutic Activities Education regarding avoiding agressive reaching or straining into ER, HA or flexion.  Avoiding heavy objects and anything more than 12-16 ounces away from the body.  Avoid R UE WB.      Exercises   Exercises Shoulder      Shoulder Exercises: Supine   Internal Rotation AROM;Right;20 reps;Limitations    Internal Rotation Limitations 10 seconds in comfortable range    Other Supine Exercises Supine arm raises (serratus anterior) 20X 3 seconds palm in      Shoulder Exercises: Standing   Retraction Strengthening;Both;10 reps;Other (comment)   5 seconds shoulder blade pinches     Manual Therapy   Manual Therapy Passive ROM    Manual therapy comments Emphasis on IR given excellent ER and flexion (and protocol restrictions at 6 weeks post-surgery).  No  HA yet.                  PT Education - 11/29/20 1508    Education Details Reviewed precautions 6 weeks post-surgery (careful with HA, ER, flexion, no lifting or WB on the R side).  Progressed IR stretch and serratus anterior activity.    Person(s) Educated Patient    Methods Explanation;Demonstration;Tactile cues;Verbal cues;Handout    Comprehension Verbal cues required;Need further instruction;Returned demonstration;Verbalized understanding;Tactile cues required            PT Short Term Goals - 11/29/20 1509      PT SHORT TERM GOAL #1   Title Improve R shoulder PROM to 120 flexion; 50 IR and 60 degrees ER by the end of week 4 post-surgery    Time 4    Period Weeks    Status On-going   initial goal did not adhere to protocol provided by MD office after initial evaluation   Target Date 11/17/20      PT SHORT TERM GOAL #2   Title Improve R shoulder PROM to 90 flexion; 50 IR and 30 degrees ER by the end  of week 4 post-surgery    Time 1    Status Achieved    Target Date 11/20/20             PT Long Term Goals - 11/29/20 1510      PT LONG TERM GOAL #1   Title Improve self-reported function on FOTO to 54.    Baseline 4    Time 12    Period Weeks    Status On-going      PT LONG TERM GOAL #2   Title Improve R shoulder AROM for flexion to 170; ER to 90; IR to 60 and horizontal adduction to 40 degrees at DC.    Baseline See objective.  90% of the above expected at DC.    Time 12    Period Weeks    Status On-going      PT LONG TERM GOAL #3   Title Improve R shoulder strength as assessed by functional scores and testing.    Baseline Deferred due to 3 days post-surgery.    Time 12    Period Weeks    Status On-going      PT LONG TERM GOAL #4   Title [name] will be independent with his long-term maintenence HEP at DC.    Time 12    Period Weeks    Status On-going                 Plan - 11/29/20 1511    Clinical Impression Statement [name] is doing great with his post-surgical progress.  Reviewed precautions, added IR stretching and scapular strengthening (easy).  [name] is ahead of schedule and he was reminded to be conservative with R UE use.    Examination-Activity Limitations Bathing;Dressing;Hygiene/Grooming;Bed Mobility;Carry;Reach Overhead    Examination-Participation Restrictions Interpersonal Relationship;Occupation;Driving;Community Activity;Cleaning    Stability/Clinical Decision Making Stable/Uncomplicated    Rehab Potential Good    PT Frequency --   adjust 1x/week untli strengthening, then 2x may be appropriate again   PT Duration 12 weeks    PT Treatment/Interventions ADLs/Self Care Home Management;Moist Heat;Cryotherapy;Therapeutic exercise;Neuromuscular re-education;Patient/family education;Therapeutic activities;Manual techniques;Passive range of motion;Vasopneumatic Device    PT Next Visit Plan Protocol progress s/p 6 weeks from surgery to flexion 0-120  degrees, er 0-45 degrees in scapular plane.  NO PULLEYS/CANE AAROM, cross body/behind back movement. No active use of Rt  UE/no WB    PT Home Exercise Plan Access Code: [email or web address]    Consulted and Agree with Plan of Care Patient           Patient will benefit from skilled therapeutic intervention in order to improve the following deficits and impairments:  Decreased endurance,Decreased range of motion,Decreased strength,Increased edema,Impaired flexibility,Impaired UE functional use,Pain  Visit Diagnosis: Stiffness of right shoulder, not elsewhere classified  Muscle weakness (generalized)  Abnormal posture  Localized edema  Acute pain of right shoulder     Problem List Patient Active Problem List   Diagnosis Date Noted  . Low back pain 02/17/2020  . Left hip pain 02/17/2020  . Healthcare maintenance 11/16/2019  . Rheumatoid arthritis of multiple sites with negative rheumatoid factor ([place]) 05/19/2018  . Tinea versicolor 05/19/2018  . Essential hypertension 03/17/2018  . DDD (degenerative disc disease), cervical 03/16/2018    Farley Ly PT, MPT 11/29/2020, 3:14 PM  [facility] [address] [place], [place], [address] Phone: [phone]   Fax:  [phone]  Name: DEMICO PLOCH MRN: [id number] Date of Birth: 1961/07/05

## 2020-11-30 ENCOUNTER — Other Ambulatory Visit (HOSPITAL_COMMUNITY): Payer: Self-pay

## 2020-12-04 ENCOUNTER — Other Ambulatory Visit (HOSPITAL_COMMUNITY): Payer: Self-pay

## 2020-12-05 ENCOUNTER — Encounter: Payer: Self-pay | Admitting: Rehabilitative and Restorative Service Providers"

## 2020-12-05 ENCOUNTER — Ambulatory Visit (INDEPENDENT_AMBULATORY_CARE_PROVIDER_SITE_OTHER): Payer: BC Managed Care – PPO | Admitting: Rehabilitative and Restorative Service Providers"

## 2020-12-05 ENCOUNTER — Other Ambulatory Visit: Payer: Self-pay

## 2020-12-05 ENCOUNTER — Other Ambulatory Visit (HOSPITAL_COMMUNITY): Payer: Self-pay

## 2020-12-05 DIAGNOSIS — M6281 Muscle weakness (generalized): Secondary | ICD-10-CM

## 2020-12-05 DIAGNOSIS — R600 Localized edema: Secondary | ICD-10-CM

## 2020-12-05 DIAGNOSIS — R293 Abnormal posture: Secondary | ICD-10-CM | POA: Diagnosis not present

## 2020-12-05 DIAGNOSIS — M25611 Stiffness of right shoulder, not elsewhere classified: Secondary | ICD-10-CM | POA: Diagnosis not present

## 2020-12-05 DIAGNOSIS — M25511 Pain in right shoulder: Secondary | ICD-10-CM | POA: Diagnosis not present

## 2020-12-05 MED ORDER — "TUBERCULIN SYRINGE 27G X 1/2"" 1 ML MISC"
3 refills | Status: DC
  Filled 2020-12-05: qty 12, 84d supply, fill #0

## 2020-12-05 NOTE — Therapy (Signed)
[facility] [place], [place], [address] Phone: [phone]   Fax:  [phone]  Physical Therapy Treatment  Patient Details  Name: Benjamin Fields MRN: [id number] Date of Birth: [birth year] Referring Provider (PT): Klifto, Merwyn Katos, MD   Encounter Date: 12/05/2020   PT End of Session - 12/05/20 1512    Visit Number 7    Number of Visits 30    Date for PT Re-Evaluation 01/12/21    Progress Note Due on Visit 15    PT Start Time 1510    PT Stop Time 1535    PT Time Calculation (min) 25 min    Activity Tolerance Patient tolerated treatment well;No increased pain    Behavior During Therapy WFL for tasks assessed/performed           Past Medical History:  Diagnosis Date  . Allergy    mild- no meds   . Anxiety   . Hypertension   . Neuromuscular disorder ([place])    mild nerve issue right leg   . RA (rheumatoid arthritis) ([place])     Past Surgical History:  Procedure Laterality Date  . CERVICAL SPINE SURGERY  2010  . COLONOSCOPY    . COLONOSCOPY  04/2020  . TONSILLECTOMY      There were no vitals filed for this visit.   Subjective Assessment - 12/05/20 1511    Subjective Pt. indicated no pain complaints to report and sleeping through night.    Patient is accompained by: Family member    Pertinent History Lt RTC tear    Patient Stated Goals Return to normal R shoulder activities (fishing and gardening) without restriction    Currently in Pain? No/denies    Pain Onset More than a month ago              [facility] PT Assessment - 12/05/20 0001      Assessment   Medical Diagnosis Rt RTC    Referring Provider (PT) Klifto, Merwyn Katos, MD    Onset Date/Surgical Date 10/16/20    Hand Dominance Left      Precautions   Precaution Comments Protocol progress s/p 6 weeks from surgery to flexion 0-120 degrees, er 0-45 degrees in scapular plane.  NO PULLEYS/CANE AAROM, cross body/behind back movement. No active  use of Rt UE/no WB      PROM   Right Shoulder Flexion 120 Degrees   limited by protocol, no complaints   Right Shoulder External Rotation 45 Degrees   limited by protocol in scapular plane                        [facility] Adult PT Treatment/Exercise - 12/05/20 0001      Neuro Re-ed    Neuro Re-ed Details  rhymtic stabilizations scapula retraction, protraction, elevation, depression c verbal/tactile cues   seated     Manual Therapy   Manual therapy comments prom all directions within protocol (flexion to 120, ER to 45, IR c overpressure gently in scapular plane.  PA/PA g3 mobs in supine to Rt glenohumural joint.                  PT Education - 12/05/20 1535    Education Details Discussed c Pt. the sugical protocol progression on next visit 12/12/2020 for active assisted movement.  Currently still in passive movement only with flexion < 120, ER < 45 in scapular plane, no horizontal adduction past midline and no hand behind back  movement.    Person(s) Educated Patient    Methods Explanation    Comprehension Verbalized understanding            PT Short Term Goals - 12/05/20 1537      PT SHORT TERM GOAL #1   Title Improve R shoulder PROM to 120 flexion; 50 IR and 60 degrees ER by the end of week 4 post-surgery    Baseline Protocol did not allow passive movement to listed goal - not applicable    Status Not Met   initial goal did not adhere to protocol provided by MD office after initial evaluation     PT SHORT TERM GOAL #2   Title Improve R shoulder PROM to 90 flexion; 50 IR and 30 degrees ER by the end of week 4 post-surgery    Time 1    Status Achieved    Target Date 11/20/20             PT Long Term Goals - 11/29/20 1510      PT LONG TERM GOAL #1   Title Improve self-reported function on FOTO to 54.    Baseline 4    Time 12    Period Weeks    Status On-going      PT LONG TERM GOAL #2   Title Improve R shoulder AROM for flexion to 170; ER to 90;  IR to 60 and horizontal adduction to 40 degrees at DC.    Baseline See objective.  90% of the above expected at DC.    Time 12    Period Weeks    Status On-going      PT LONG TERM GOAL #3   Title Improve R shoulder strength as assessed by functional scores and testing.    Baseline Deferred due to 3 days post-surgery.    Time 12    Period Weeks    Status On-going      PT LONG TERM GOAL #4   Title [name] will be independent with his long-term maintenence HEP at DC.    Time 12    Period Weeks    Status On-going                 Plan - 12/05/20 1538    Clinical Impression Statement Mild IR mobility deficits noted in scapular plane at this time.  No restriction by body within protocol for flexion, ER at this time.  Discussed c Pt. the sugical protocol progression on next visit 12/12/2020 for active assisted movement.  Currently still in passive movement only with flexion < 120, ER < 45 in scapular plane, no horizontal adduction past midline and no hand behind back movement.  Due to overall improvements, Pt. is appropriate and in agreement for waiting until 12/12/2020 for next visit to allow progression with protocol.    Examination-Activity Limitations Bathing;Dressing;Hygiene/Grooming;Bed Mobility;Carry;Reach Overhead    Examination-Participation Restrictions Interpersonal Relationship;Occupation;Driving;Community Activity;Cleaning    Stability/Clinical Decision Making Stable/Uncomplicated    Rehab Potential Good    PT Frequency --   adjust 1x/week untli strengthening, then 2x may be appropriate again   PT Duration 12 weeks    PT Treatment/Interventions ADLs/Self Care Home Management;Moist Heat;Cryotherapy;Therapeutic exercise;Neuromuscular re-education;Patient/family education;Therapeutic activities;Manual techniques;Passive range of motion;Vasopneumatic Device    PT Next Visit Plan No active use of Rt arm on land per protocol with transition on 12/12/2020 to active assisted movement  within tolerance with no excessive behind back movement, no WB on arm, avoid inferior glides or distractions until  12 weeks.    PT Home Exercise Plan Access Code: [email or web address]    Consulted and Agree with Plan of Care Patient           Patient will benefit from skilled therapeutic intervention in order to improve the following deficits and impairments:  Decreased endurance,Decreased range of motion,Decreased strength,Increased edema,Impaired flexibility,Impaired UE functional use,Pain  Visit Diagnosis: Acute pain of right shoulder  Stiffness of right shoulder, not elsewhere classified  Muscle weakness (generalized)  Abnormal posture  Localized edema     Problem List Patient Active Problem List   Diagnosis Date Noted  . Low back pain 02/17/2020  . Left hip pain 02/17/2020  . Healthcare maintenance 11/16/2019  . Rheumatoid arthritis of multiple sites with negative rheumatoid factor ([place]) 05/19/2018  . Tinea versicolor 05/19/2018  . Essential hypertension 03/17/2018  . DDD (degenerative disc disease), cervical 03/16/2018    Scot Jun, PT, DPT, OCS, ATC 12/05/20  3:44 PM    [facility] [address] [place], [place], [address] Phone: [phone]   Fax:  [phone]  Name: Benjamin Fields MRN: [id number] Date of Birth: 1961-04-23

## 2020-12-06 ENCOUNTER — Other Ambulatory Visit (HOSPITAL_COMMUNITY): Payer: Self-pay

## 2020-12-07 ENCOUNTER — Encounter: Payer: BC Managed Care – PPO | Admitting: Rehabilitative and Restorative Service Providers"

## 2020-12-12 ENCOUNTER — Encounter: Payer: BC Managed Care – PPO | Admitting: Rehabilitative and Restorative Service Providers"

## 2020-12-14 ENCOUNTER — Ambulatory Visit (INDEPENDENT_AMBULATORY_CARE_PROVIDER_SITE_OTHER): Payer: BC Managed Care – PPO | Admitting: Rehabilitative and Restorative Service Providers"

## 2020-12-14 ENCOUNTER — Encounter: Payer: Self-pay | Admitting: Rehabilitative and Restorative Service Providers"

## 2020-12-14 ENCOUNTER — Other Ambulatory Visit: Payer: Self-pay

## 2020-12-14 DIAGNOSIS — M6281 Muscle weakness (generalized): Secondary | ICD-10-CM | POA: Diagnosis not present

## 2020-12-14 DIAGNOSIS — M25511 Pain in right shoulder: Secondary | ICD-10-CM | POA: Diagnosis not present

## 2020-12-14 DIAGNOSIS — R600 Localized edema: Secondary | ICD-10-CM

## 2020-12-14 DIAGNOSIS — M25611 Stiffness of right shoulder, not elsewhere classified: Secondary | ICD-10-CM | POA: Diagnosis not present

## 2020-12-14 DIAGNOSIS — R293 Abnormal posture: Secondary | ICD-10-CM

## 2020-12-14 NOTE — Therapy (Signed)
[facility] [place], [place], [address] Phone: [phone]   Fax:  [phone]  Physical Therapy Treatment  Patient Details  Name: Benjamin Fields MRN: [id number] Date of Birth: 29-Jan-1961 Referring Provider (PT): Klifto, Merwyn Katos, MD   Encounter Date: 12/14/2020   PT End of Session - 12/14/20 1540    Visit Number 8    Number of Visits 30    Date for PT Re-Evaluation 01/12/21    Progress Note Due on Visit 15    PT Start Time 4967    PT Stop Time 5916    PT Time Calculation (min) 39 min    Activity Tolerance Patient tolerated treatment well;No increased pain    Behavior During Therapy WFL for tasks assessed/performed           Past Medical History:  Diagnosis Date  . Allergy    mild- no meds   . Anxiety   . Hypertension   . Neuromuscular disorder ([place])    mild nerve issue right leg   . RA (rheumatoid arthritis) ([place])     Past Surgical History:  Procedure Laterality Date  . CERVICAL SPINE SURGERY  2010  . COLONOSCOPY    . COLONOSCOPY  04/2020  . TONSILLECTOMY      There were no vitals filed for this visit.   Subjective Assessment - 12/14/20 1522    Subjective Pt. stated no pain really.  Some soreness in protraction movement.    Patient is accompained by: Family member    Pertinent History Lt RTC tear    Patient Stated Goals Return to normal R shoulder activities (fishing and gardening) without restriction    Currently in Pain? No/denies    Pain Score 0-No pain    Pain Onset More than a month ago              [facility] PT Assessment - 12/14/20 0001      Assessment   Medical Diagnosis Rt RTC    Referring Provider (PT) Klifto, Merwyn Katos, MD    Onset Date/Surgical Date 10/16/20    Hand Dominance Left      Precautions   Precaution Comments Protocol s/p 8 weeks now allows transitioning from PROM to [facility] and AAROM "gradually" as well as restoring normal ranges.  No WB on hands/arms, no  "excessive behind back movement."                         [facility] Adult PT Treatment/Exercise - 12/14/20 0001      Exercises   Other Exercises  Protocol review and HEP progression c cues on techniques      Shoulder Exercises: Prone   Other Prone Exercises scap retraction 5 sec hold x 10, retraction c GH ext off table to neutral 5 sec hold x 10      Shoulder Exercises: Sidelying   Other Sidelying Exercises abduction, er c towel at side 2 x 10 each Rt arm in Lt sidelying      Shoulder Exercises: Standing   Row 20 reps;Theraband;Both    Theraband Level (Shoulder Row) Level 3 (Green)      Manual Therapy   Manual therapy comments PROM in flexion, abduction, ER, IR to tolerance (no longer has specific range limitations by protocol)                  PT Education - 12/14/20 1538    Education Details Discussed protocol progression and paired HEP  changes to progression.   Discussed the importance of avoiding shrug sign in movement.    Person(s) Educated Patient    Methods Explanation;Demonstration;Handout;Verbal cues    Comprehension Verbalized understanding;Returned demonstration            PT Short Term Goals - 12/05/20 1537      PT SHORT TERM GOAL #1   Title Improve R shoulder PROM to 120 flexion; 50 IR and 60 degrees ER by the end of week 4 post-surgery    Baseline Protocol did not allow passive movement to listed goal - not applicable    Status Not Met   initial goal did not adhere to protocol provided by MD office after initial evaluation     PT SHORT TERM GOAL #2   Title Improve R shoulder PROM to 90 flexion; 50 IR and 30 degrees ER by the end of week 4 post-surgery    Time 1    Status Achieved    Target Date 11/20/20             PT Long Term Goals - 11/29/20 1510      PT LONG TERM GOAL #1   Title Improve self-reported function on FOTO to 54.    Baseline 4    Time 12    Period Weeks    Status On-going      PT LONG TERM GOAL #2   Title  Improve R shoulder AROM for flexion to 170; ER to 90; IR to 60 and horizontal adduction to 40 degrees at DC.    Baseline See objective.  90% of the above expected at DC.    Time 12    Period Weeks    Status On-going      PT LONG TERM GOAL #3   Title Improve R shoulder strength as assessed by functional scores and testing.    Baseline Deferred due to 3 days post-surgery.    Time 12    Period Weeks    Status On-going      PT LONG TERM GOAL #4   Title [name] will be independent with his long-term maintenence HEP at DC.    Time 12    Period Weeks    Status On-going                 Plan - 12/14/20 1549    Clinical Impression Statement Protocol progressed to allow progression through passive to active assisted to active movement as tolerated with removal of end range restriction except for "no excessive behind back movement."  Pt. was able to perform gravity reduced active movement c good control and no symptoms, so they were added to HEP at this time.  Recommend screening for BFR use due to weight lifting limits to allow progression of strengthening.    Examination-Activity Limitations Bathing;Dressing;Hygiene/Grooming;Bed Mobility;Carry;Reach Overhead    Examination-Participation Restrictions Interpersonal Relationship;Occupation;Driving;Community Activity;Cleaning    Stability/Clinical Decision Making Stable/Uncomplicated    Rehab Potential Good    PT Frequency --   adjust 1x/week untli strengthening, then 2x may be appropriate again   PT Duration 12 weeks    PT Treatment/Interventions ADLs/Self Care Home Management;Moist Heat;Cryotherapy;Therapeutic exercise;Neuromuscular re-education;Patient/family education;Therapeutic activities;Manual techniques;Passive range of motion;Vasopneumatic Device    PT Next Visit Plan BFR use in active movement in gravity reduced positioning for early strengthening.  Continue to progress mild end range tightnesses.   Protocol still limits "excessive  behind back movement."    PT Home Exercise Plan Access Code: [email or web address]    Consulted  and Agree with Plan of Care Patient           Patient will benefit from skilled therapeutic intervention in order to improve the following deficits and impairments:  Decreased endurance,Decreased range of motion,Decreased strength,Increased edema,Impaired flexibility,Impaired UE functional use,Pain  Visit Diagnosis: Acute pain of right shoulder  Stiffness of right shoulder, not elsewhere classified  Muscle weakness (generalized)  Abnormal posture  Localized edema     Problem List Patient Active Problem List   Diagnosis Date Noted  . Low back pain 02/17/2020  . Left hip pain 02/17/2020  . Healthcare maintenance 11/16/2019  . Rheumatoid arthritis of multiple sites with negative rheumatoid factor ([place]) 05/19/2018  . Tinea versicolor 05/19/2018  . Essential hypertension 03/17/2018  . DDD (degenerative disc disease), cervical 03/16/2018    Scot Jun, PT, DPT, OCS, ATC 12/14/20  3:54 PM    [facility] [address] [place], [place], [address] Phone: [phone]   Fax:  [phone]  Name: ELUTERIO SEYMOUR MRN: [id number] Date of Birth: [birth year]

## 2020-12-14 NOTE — Patient Instructions (Signed)
Access Code: [email or web address] URL: [email or web address] Date: 12/14/2020 Prepared by: Scot Jun  Exercises Pendulums - 5 x daily - 7 x weekly - 1 sets - 20-30 reps Standing Scapular Retraction - 5 x daily - 7 x weekly - 1 sets - 5 reps - 5 second hold Supine Shoulder External Rotation with Dowel at 20 Degrees of Abduction - 2-3 x daily - 7 x weekly - 1 sets - 10 reps - 10 hold Supine Shoulder Internal Rotation Stretch - 1-2 x daily - 7 x weekly - 1 sets - 10-15 reps - 10 secondsinutes hold Supine Shoulder Flexion PROM - 2 x daily - 7 x weekly - 1-2 sets - 10 reps - 5 hold Supine Shoulder Flexion Extension Full Range AROM - 2 x daily - 7 x weekly - 3 sets - 15-20 reps Sidelying Shoulder Abduction Palm Forward - 2 x daily - 7 x weekly - 3 sets - 10 reps Sidelying Shoulder External Rotation - 2 x daily - 7 x weekly - 3 sets - 10 reps

## 2020-12-15 ENCOUNTER — Other Ambulatory Visit: Payer: Self-pay | Admitting: Family

## 2020-12-15 DIAGNOSIS — S20469A Insect bite (nonvenomous) of unspecified back wall of thorax, initial encounter: Secondary | ICD-10-CM | POA: Diagnosis not present

## 2020-12-15 DIAGNOSIS — B029 Zoster without complications: Secondary | ICD-10-CM | POA: Diagnosis not present

## 2020-12-15 DIAGNOSIS — W57XXXA Bitten or stung by nonvenomous insect and other nonvenomous arthropods, initial encounter: Secondary | ICD-10-CM | POA: Diagnosis not present

## 2020-12-19 ENCOUNTER — Encounter: Payer: Self-pay | Admitting: Rehabilitative and Restorative Service Providers"

## 2020-12-19 ENCOUNTER — Encounter: Payer: Self-pay | Admitting: Family Medicine

## 2020-12-19 ENCOUNTER — Other Ambulatory Visit: Payer: Self-pay

## 2020-12-19 ENCOUNTER — Ambulatory Visit (INDEPENDENT_AMBULATORY_CARE_PROVIDER_SITE_OTHER): Payer: BC Managed Care – PPO | Admitting: Rehabilitative and Restorative Service Providers"

## 2020-12-19 DIAGNOSIS — M25511 Pain in right shoulder: Secondary | ICD-10-CM

## 2020-12-19 DIAGNOSIS — R293 Abnormal posture: Secondary | ICD-10-CM | POA: Diagnosis not present

## 2020-12-19 DIAGNOSIS — M6281 Muscle weakness (generalized): Secondary | ICD-10-CM | POA: Diagnosis not present

## 2020-12-19 DIAGNOSIS — F418 Other specified anxiety disorders: Secondary | ICD-10-CM

## 2020-12-19 DIAGNOSIS — R600 Localized edema: Secondary | ICD-10-CM

## 2020-12-19 DIAGNOSIS — M25611 Stiffness of right shoulder, not elsewhere classified: Secondary | ICD-10-CM | POA: Diagnosis not present

## 2020-12-19 NOTE — Patient Instructions (Signed)
Access Code: [email or web address] URL: [email or web address] Date: 12/19/2020 Prepared by: Scot Jun  Exercises Standing Scapular Retraction - 5 x daily - 7 x weekly - 1 sets - 5 reps - 5 second hold Supine Shoulder External Rotation with Dowel at 20 Degrees of Abduction - 2-3 x daily - 7 x weekly - 1 sets - 10 reps - 10 hold Supine Shoulder Internal Rotation Stretch - 1-2 x daily - 7 x weekly - 1 sets - 10-15 reps - 10 secondsinutes hold Sidelying Shoulder Abduction Palm Forward - 2 x daily - 7 x weekly - 3 sets - 10 reps Sidelying Shoulder External Rotation - 2 x daily - 7 x weekly - 3 sets - 10 reps Sidelying Shoulder Flexion 15 Degrees - 2 x daily - 7 x weekly - 3 sets - 10 reps Standing Bilateral Low Shoulder Row with Anchored Resistance - 1 x daily - 7 x weekly - 2-3 sets - 15 reps Shoulder Extension with Resistance - 1 x daily - 7 x weekly - 2-3 sets - 15 reps

## 2020-12-19 NOTE — Therapy (Signed)
[facility] [place], [place], [address] Phone: [phone]   Fax:  [phone]  Physical Therapy Treatment  Patient Details  Name: Benjamin Fields MRN: [id number] Date of Birth: 1961/05/30 Referring Provider (PT): Klifto, Merwyn Katos, MD   Encounter Date: 12/19/2020   PT End of Session - 12/19/20 1510    Visit Number 9    Number of Visits 30    Date for PT Re-Evaluation 01/12/21    Progress Note Due on Visit 15    PT Start Time 2542    PT Stop Time 1550    PT Time Calculation (min) 39 min    Activity Tolerance Patient tolerated treatment well;No increased pain    Behavior During Therapy WFL for tasks assessed/performed           Past Medical History:  Diagnosis Date  . Allergy    mild- no meds   . Anxiety   . Hypertension   . Neuromuscular disorder ([place])    mild nerve issue right leg   . RA (rheumatoid arthritis) ([place])     Past Surgical History:  Procedure Laterality Date  . CERVICAL SPINE SURGERY  2010  . COLONOSCOPY    . COLONOSCOPY  04/2020  . TONSILLECTOMY      There were no vitals filed for this visit.   Subjective Assessment - 12/19/20 1514    Subjective No pain indicated today.  Pt. stated new exercises are going well without any aggravation.    Patient is accompained by: Family member    Pertinent History Lt RTC tear    Patient Stated Goals Return to normal R shoulder activities (fishing and gardening) without restriction    Currently in Pain? No/denies    Pain Score 0-No pain    Pain Onset More than a month ago                             [facility] Adult PT Treatment/Exercise - 12/19/20 0001      Blood Flow Restriction   Blood Flow Restriction Yes      Blood Flow Restriction-Positions    Blood Flow Restriction Position comment   sidelying   BFR comment LOP: 166 mmHg      Shoulder Exercises: Sidelying   External Rotation Right   BFR 82 mmHg 30, 3 x 15 1 lb c 30 sec rest  breaks c towel at side   Flexion Right   BFR 82 mmHg 30, 3 x 15 c 30 sec rest breaks   ABduction Right   BFR 82 mmHg 30x, 3 x 15 c 1 lb c 30 sec rest     Shoulder Exercises: Standing   Row 20 reps;Theraband;Both    Theraband Level (Shoulder Row) Level 4 (Blue)      Shoulder Exercises: ROM/Strengthening   UBE (Upper Arm Bike) Lvl 2.5 3 mins fwd/back each way                  PT Education - 12/19/20 1543    Education Details Progressed HEP c sidelying active movement.    Person(s) Educated Patient    Methods Explanation;Demonstration;Handout;Verbal cues    Comprehension Verbalized understanding;Returned demonstration            PT Short Term Goals - 12/05/20 1537      PT SHORT TERM GOAL #1   Title Improve R shoulder PROM to 120 flexion; 50 IR and 60 degrees ER by  the end of week 4 post-surgery    Baseline Protocol did not allow passive movement to listed goal - not applicable    Status Not Met   initial goal did not adhere to protocol provided by MD office after initial evaluation     PT SHORT TERM GOAL #2   Title Improve R shoulder PROM to 90 flexion; 50 IR and 30 degrees ER by the end of week 4 post-surgery    Time 1    Status Achieved    Target Date 11/20/20             PT Long Term Goals - 11/29/20 1510      PT LONG TERM GOAL #1   Title Improve self-reported function on FOTO to 54.    Baseline 4    Time 12    Period Weeks    Status On-going      PT LONG TERM GOAL #2   Title Improve R shoulder AROM for flexion to 170; ER to 90; IR to 60 and horizontal adduction to 40 degrees at DC.    Baseline See objective.  90% of the above expected at DC.    Time 12    Period Weeks    Status On-going      PT LONG TERM GOAL #3   Title Improve R shoulder strength as assessed by functional scores and testing.    Baseline Deferred due to 3 days post-surgery.    Time 12    Period Weeks    Status On-going      PT LONG TERM GOAL #4   Title [name] will be independent  with his long-term maintenence HEP at DC.    Time 12    Period Weeks    Status On-going                 Plan - 12/19/20 1524    Clinical Impression Statement Good tolerance to BFR application in gravity reduced sidelying strengthening c good techniques.  Pt. has demonstrated good progression in ability to activate in active movement at this time.    Examination-Activity Limitations Bathing;Dressing;Hygiene/Grooming;Bed Mobility;Carry;Reach Overhead    Examination-Participation Restrictions Interpersonal Relationship;Occupation;Driving;Community Activity;Cleaning    Stability/Clinical Decision Making Stable/Uncomplicated    Rehab Potential Good    PT Frequency --   adjust 1x/week untli strengthening, then 2x may be appropriate again   PT Duration 12 weeks    PT Treatment/Interventions ADLs/Self Care Home Management;Moist Heat;Cryotherapy;Therapeutic exercise;Neuromuscular re-education;Patient/family education;Therapeutic activities;Manual techniques;Passive range of motion;Vasopneumatic Device    PT Next Visit Plan BFR use in active movement in gravity reduced positioning for strengthening. Protocol still limits "excessive behind back movement."    PT Home Exercise Plan Access Code: [email or web address]    Consulted and Agree with Plan of Care Patient           Patient will benefit from skilled therapeutic intervention in order to improve the following deficits and impairments:  Decreased endurance,Decreased range of motion,Decreased strength,Increased edema,Impaired flexibility,Impaired UE functional use,Pain  Visit Diagnosis: Acute pain of right shoulder  Stiffness of right shoulder, not elsewhere classified  Muscle weakness (generalized)  Abnormal posture  Localized edema     Problem List Patient Active Problem List   Diagnosis Date Noted  . Low back pain 02/17/2020  . Left hip pain 02/17/2020  . Healthcare maintenance 11/16/2019  . Rheumatoid arthritis of multiple sites  with negative rheumatoid factor ([place]) 05/19/2018  . Tinea versicolor 05/19/2018  . Essential hypertension 03/17/2018  . DDD (  degenerative disc disease), cervical 03/16/2018    Scot Jun, PT, DPT, OCS, ATC 12/19/20  3:47 PM    [facility] [address] [place], [place], [address] Phone: [phone]   Fax:  [phone]  Name: Benjamin Fields MRN: [id number] Date of Birth: 1960-11-25

## 2020-12-20 ENCOUNTER — Other Ambulatory Visit: Payer: Self-pay

## 2020-12-20 NOTE — Telephone Encounter (Signed)
Refill request for pending Rx last OV June 2021 last RF March 2022 patient aware to give [place] a call to schedule follow up visit on medications. Please advise.

## 2020-12-21 ENCOUNTER — Encounter: Payer: Self-pay | Admitting: Rehabilitative and Restorative Service Providers"

## 2020-12-21 ENCOUNTER — Ambulatory Visit (INDEPENDENT_AMBULATORY_CARE_PROVIDER_SITE_OTHER): Payer: BC Managed Care – PPO | Admitting: Rehabilitative and Restorative Service Providers"

## 2020-12-21 ENCOUNTER — Other Ambulatory Visit: Payer: Self-pay

## 2020-12-21 DIAGNOSIS — M25511 Pain in right shoulder: Secondary | ICD-10-CM | POA: Diagnosis not present

## 2020-12-21 DIAGNOSIS — M25611 Stiffness of right shoulder, not elsewhere classified: Secondary | ICD-10-CM | POA: Diagnosis not present

## 2020-12-21 DIAGNOSIS — R293 Abnormal posture: Secondary | ICD-10-CM | POA: Diagnosis not present

## 2020-12-21 DIAGNOSIS — M6281 Muscle weakness (generalized): Secondary | ICD-10-CM | POA: Diagnosis not present

## 2020-12-21 DIAGNOSIS — R600 Localized edema: Secondary | ICD-10-CM

## 2020-12-21 MED ORDER — DULOXETINE HCL 60 MG PO CPEP: 60.0000 mg | ORAL_CAPSULE | Freq: Every day | ORAL | 0 refills | Status: DC

## 2020-12-21 NOTE — Therapy (Signed)
[place], [place], [address] Phone: [phone]   Fax:  [phone]  Physical Therapy Treatment  Patient Details  Name: Benjamin Fields MRN: [id number] Date of Birth: 1961/05/12 Referring Provider (PT): Klifto, Merwyn Katos, MD   Encounter Date: 12/21/2020   PT End of Session - 12/21/20 1511    Visit Number 10    Number of Visits 30    Date for PT Re-Evaluation 01/12/21    Progress Note Due on Visit 15    PT Start Time 9485    PT Stop Time 1550    PT Time Calculation (min) 39 min    Activity Tolerance Patient tolerated treatment well;No increased pain    Behavior During Therapy WFL for tasks assessed/performed           Past Medical History:  Diagnosis Date  . Allergy    mild- no meds   . Anxiety   . Hypertension   . Neuromuscular disorder ([place])    mild nerve issue right leg   . RA (rheumatoid arthritis) ([place])     Past Surgical History:  Procedure Laterality Date  . CERVICAL SPINE SURGERY  2010  . COLONOSCOPY    . COLONOSCOPY  04/2020  . TONSILLECTOMY      There were no vitals filed for this visit.   Subjective Assessment - 12/21/20 1514    Subjective Pt. stated soreness in muscles day after last visit.  Felt better today with no complaints of pain.    Patient is accompained by: Family member    Pertinent History Lt RTC tear    Patient Stated Goals Return to normal R shoulder activities (fishing and gardening) without restriction    Currently in Pain? No/denies    Pain Score 0-No pain    Pain Onset More than a month ago              [facility] PT Assessment - 12/21/20 0001      Precautions   Precaution Comments Protocol progressed to allow progression through passive to active assisted to active movement as tolerated with removal of end range restriction except for "no excessive behind back movement."      Observation/Other Assessments   Focus on Therapeutic Outcomes (FOTO)  update 72%                          [facility] Adult PT Treatment/Exercise - 12/21/20 0001      Blood Flow Restriction-Positions    Blood Flow Restriction Position comment    BFR comment LOP 166 mmHg, size 2      Shoulder Exercises: Supine   Protraction Right;20 reps   2 lbs 5 sec hold   Protraction Weight (lbs) 2    Other Supine Exercises supine 90 deg flexion circles cw, ccw 2 lb 2 x 20 each direction Rt arm      Shoulder Exercises: Sidelying   External Rotation Right   BFR 82 mmHg 30x, 3 x 15 c 1 lb c 30 sec rest   External Rotation Weight (lbs) 1    Flexion Right   BFR 82 mmHg 30x, 3 x 15  c 30 sec rest   ABduction Right   BFR 82 mmHg 30x, 3 x 15 c 30 sec rest     Shoulder Exercises: Standing   Extension 20 reps;Both;Theraband    Theraband Level (Shoulder Extension) Level 3 (Green)    Row Both;Theraband;20 reps   c retraction  hold 3 seconds   Theraband Level (Shoulder Row) Level 3 (Green)    Retraction Other (comment)   Retraction included in prone HEP and rows above     Shoulder Exercises: ROM/Strengthening   UBE (Upper Arm Bike) Lvl 2.5 3 mins fwd/back each way      Manual Therapy   Manual therapy comments PROM to tolerance for ER/IR, mild contract/relax for each movement                    PT Short Term Goals - 12/05/20 1537      PT SHORT TERM GOAL #1   Title Improve R shoulder PROM to 120 flexion; 50 IR and 60 degrees ER by the end of week 4 post-surgery    Baseline Protocol did not allow passive movement to listed goal - not applicable    Status Not Met   initial goal did not adhere to protocol provided by MD office after initial evaluation     PT SHORT TERM GOAL #2   Title Improve R shoulder PROM to 90 flexion; 50 IR and 30 degrees ER by the end of week 4 post-surgery    Time 1    Status Achieved    Target Date 11/20/20             PT Long Term Goals - 11/29/20 1510      PT LONG TERM GOAL #1   Title Improve self-reported function on FOTO to 54.     Baseline 4    Time 12    Period Weeks    Status On-going      PT LONG TERM GOAL #2   Title Improve R shoulder AROM for flexion to 170; ER to 90; IR to 60 and horizontal adduction to 40 degrees at DC.    Baseline See objective.  90% of the above expected at DC.    Time 12    Period Weeks    Status On-going      PT LONG TERM GOAL #3   Title Improve R shoulder strength as assessed by functional scores and testing.    Baseline Deferred due to 3 days post-surgery.    Time 12    Period Weeks    Status On-going      PT LONG TERM GOAL #4   Title [name] will be independent with his long-term maintenence HEP at DC.    Time 12    Period Weeks    Status On-going                 Plan - 12/21/20 1537    Clinical Impression Statement Noted improvement in FOTO outcome score as documented.  Due to soreness complaints, held BFR activity to similar presentation as last time to build up endurance and capacity prior to increasing difficulty.    Examination-Activity Limitations Bathing;Dressing;Hygiene/Grooming;Bed Mobility;Carry;Reach Overhead    Examination-Participation Restrictions Interpersonal Relationship;Occupation;Driving;Community Activity;Cleaning    Stability/Clinical Decision Making Stable/Uncomplicated    Rehab Potential Good    PT Frequency --   adjust 1x/week untli strengthening, then 2x may be appropriate again   PT Duration 12 weeks    PT Treatment/Interventions ADLs/Self Care Home Management;Moist Heat;Cryotherapy;Therapeutic exercise;Neuromuscular re-education;Patient/family education;Therapeutic activities;Manual techniques;Passive range of motion;Vasopneumatic Device    PT Next Visit Plan BFR use in active movement in gravity reduced positioning for strengthening. Protocol still limits "excessive behind back movement."  Getting closer to gravity based AROM in next visit or two.    PT Home Exercise  Plan Access Code: [email or web address]    Consulted and Agree with Plan of Care  Patient           Patient will benefit from skilled therapeutic intervention in order to improve the following deficits and impairments:  Decreased endurance,Decreased range of motion,Decreased strength,Increased edema,Impaired flexibility,Impaired UE functional use,Pain  Visit Diagnosis: Acute pain of right shoulder  Stiffness of right shoulder, not elsewhere classified  Muscle weakness (generalized)  Abnormal posture  Localized edema     Problem List Patient Active Problem List   Diagnosis Date Noted  . Low back pain 02/17/2020  . Left hip pain 02/17/2020  . Healthcare maintenance 11/16/2019  . Rheumatoid arthritis of multiple sites with negative rheumatoid factor ([place]) 05/19/2018  . Tinea versicolor 05/19/2018  . Essential hypertension 03/17/2018  . DDD (degenerative disc disease), cervical 03/16/2018    Scot Jun, PT, DPT, OCS, ATC 12/21/20  3:46 PM    [facility] [address] [place], [place], [address] Phone: [phone]   Fax:  [phone]  Name: Benjamin Fields MRN: [id number] Date of Birth: 24-Jul-1961

## 2020-12-22 ENCOUNTER — Ambulatory Visit: Payer: BC Managed Care – PPO | Admitting: Family Medicine

## 2020-12-22 ENCOUNTER — Encounter: Payer: Self-pay | Admitting: Family Medicine

## 2020-12-22 VITALS — BP 130/78 | HR 84 | Temp 97.7°F | Ht 70.0 in | Wt 192.8 lb

## 2020-12-22 DIAGNOSIS — F418 Other specified anxiety disorders: Secondary | ICD-10-CM | POA: Diagnosis not present

## 2020-12-22 DIAGNOSIS — I10 Essential (primary) hypertension: Secondary | ICD-10-CM

## 2020-12-22 MED ORDER — LOSARTAN POTASSIUM 50 MG PO TABS
50.0000 mg | ORAL_TABLET | Freq: Every day | ORAL | 1 refills | Status: DC
Start: 2020-12-22 — End: 2021-07-02

## 2020-12-22 MED ORDER — DULOXETINE HCL 60 MG PO CPEP: 60.0000 mg | ORAL_CAPSULE | Freq: Every day | ORAL | 4 refills | Status: DC

## 2020-12-22 NOTE — Progress Notes (Signed)
Established Patient Office Visit  Subjective:  Patient ID: Benjamin Fields, male    DOB: 06-13-1961  Age: 60 y.o. MRN: [id number]  CC:  Chief Complaint  Patient presents with  . Follow-up    F/u HTN.    HPI Benjamin Fields presents for follow-up of anxiety with depression and hypertension.  He has taken duloxetine for some 3 years now.  It is worked well for him and he wishes to stay on it.  His blood pressure has been running in the 130s over 80s.  He has had no issues with the losartan.  He has been seen at [facility] for his inflammatory arthritis.  They feel that his arthritis is more likely to be psoriatic arthritis.  Recent shoulder surgery in the right side has made a world of difference for him.  Continues to work full-time.  He had been lost to follow-up from August of this past year.  Past Medical History:  Diagnosis Date  . Allergy    mild- no meds   . Anxiety   . Hypertension   . Neuromuscular disorder ([place])    mild nerve issue right leg   . RA (rheumatoid arthritis) ([place])     Past Surgical History:  Procedure Laterality Date  . CERVICAL SPINE SURGERY  2010  . COLONOSCOPY    . COLONOSCOPY  04/2020  . TONSILLECTOMY      Family History  Problem Relation Age of Onset  . Parkinson's disease Mother   . Atrial fibrillation Mother   . Heart failure Father   . Hypertension Father   . Prostate cancer Father   . Colon cancer Neg Hx   . Colon polyps Neg Hx   . Esophageal cancer Neg Hx   . Rectal cancer Neg Hx   . Stomach cancer Neg Hx     Social History   Socioeconomic History  . Marital status: Married    Spouse name: Not on file  . Number of children: Not on file  . Years of education: Not on file  . Highest education level: Not on file  Occupational History  . Not on file  Tobacco Use  . Smoking status: Never Smoker  . Smokeless tobacco: Never Used  Vaping Use  . Vaping Use: Never used  Substance and Sexual Activity  . Alcohol use: Never  . Drug use:  Never  . Sexual activity: Not on file  Other Topics Concern  . Not on file  Social History Narrative   Lives home with wife.  workd at [employer]  Education: Masters.  Children 2.  Caffeine 2 cups daily   Social Determinants of Health   Financial Resource Strain: Not on file  Food Insecurity: Not on file  Transportation Needs: Not on file  Physical Activity: Not on file  Stress: Not on file  Social Connections: Not on file  Intimate Partner Violence: Not on file    Outpatient Medications Prior to Visit  Medication Sig Dispense Refill  . gabapentin (NEURONTIN) 100 MG capsule Take 1 capsule in am, 1 capsule at lunch and 3 capsules at bedtime. 450 capsule 3  . Multiple Vitamin (MULTIVITAMIN) tablet Take 1 tablet by mouth daily. OTC    . Secukinumab, 300 MG Dose, (COSENTYX SENSOREADY, 300 MG,) 150 MG/ML SOAJ inject 300mg  subcutaneously every 28 days 6 mL 3  . Syringe/Needle, Disp, (B-D ECLIPSE SYRINGE) 27G X 1/2" 1 ML MISC Use 1 Syringe every 7 (seven) days For Methotrexate Injections    .  TUBERCULIN SYR 1CC/27GX1/2" (B-D TB SYRINGE 1CC/27GX1/2") 27G X 1/2" 1 ML MISC Use 1 syringe every 7 days for Methotrexate injections 12 each 3  . DULoxetine (CYMBALTA) 60 MG capsule Take 1 capsule (60 mg total) by mouth daily. 30 capsule 0  . losartan (COZAAR) 25 MG tablet Take 1 tablet (25 mg total) by mouth daily. for high blood pressure 90 tablet 1  . celecoxib (CELEBREX) 200 MG capsule Take 200 mg by mouth 2 (two) times daily.    . folic acid (FOLVITE) 1 MG tablet Take 1 mg by mouth daily.    . methotrexate 50 MG/2ML injection Inject into the skin.    Marland Kitchen triamcinolone cream (KENALOG) 0.1 % Apply topically 2 (two) times daily.    . Secukinumab 150 MG/ML SOAJ FIRST INJECT 150 MG ONCE A WEEK FOR 5 WEEKS. AFTER THAT, INJECT 300 MG ONCE A MONTH AS A MAINTENANCE DOSE (Patient not taking: Reported on 12/22/2020) 2 mL 6  . Secukinumab, 300 MG Dose, 150 MG/ML SOAJ INJECT 300 MG SUBCUTANEOUSLY EVERY 7 (SEVEN)  DAYS FOR 4 DOSES LOADING DOSE WEEKS 1-5, STARTING ON WEEK 5 CONTINUE WITH 300 MG EVERY 28 DAYS (Patient not taking: Reported on 12/22/2020) 8 mL 0   No facility-administered medications prior to visit.    Allergies  Allergen Reactions  . Cortisone     FLARE    ROS Review of Systems  Constitutional: Negative.   HENT: Negative.   Eyes: Negative for photophobia and visual disturbance.  Respiratory: Negative.   Cardiovascular: Negative.   Gastrointestinal: Negative.   Endocrine: Negative for polyphagia and polyuria.  Genitourinary: Negative.   Musculoskeletal: Positive for arthralgias.  Neurological: Negative for speech difficulty and weakness.   Depression screen [facility] 2/9 02/17/2020 02/17/2020 11/16/2019  Decreased Interest 1 0 1  Down, Depressed, Hopeless 0 0 1  PHQ - 2 Score 1 0 2  Altered sleeping 0 - 1  Tired, decreased energy 2 - 1  Change in appetite 0 - 1  Feeling bad or failure about yourself  0 - 1  Trouble concentrating 1 - 1  Moving slowly or fidgety/restless 1 - 1  Suicidal thoughts 0 - 0  PHQ-9 Score 5 - 8  Difficult doing work/chores Somewhat difficult - Somewhat difficult      Objective:    Physical Exam  BP 130/78   Pulse 84   Temp 97.7 F (36.5 C) (Temporal)   Ht 5\' 10"  (1.778 m)   Wt 192 lb 12.8 oz (87.5 kg)   SpO2 95%   BMI 27.66 kg/m  Wt Readings from Last 3 Encounters:  12/22/20 192 lb 12.8 oz (87.5 kg)  08/04/20 180 lb (81.6 kg)  07/03/20 195 lb (88.5 kg)     Health Maintenance Due  Topic Date Due  . COVID-19 Vaccine (3 - Booster for Pfizer series) 06/20/2020    There are no preventive care reminders to display for this patient.  Lab Results  Component Value Date   TSH 1.03 03/17/2018   Lab Results  Component Value Date   WBC 8.3 02/17/2020   HGB 15.1 02/17/2020   HCT 43.6 02/17/2020   MCV 90.7 02/17/2020   PLT 248.0 02/17/2020   Lab Results  Component Value Date   NA 140 02/17/2020   K 4.5 02/17/2020   CO2 28 02/17/2020    GLUCOSE 102 (H) 02/17/2020   BUN 18 02/17/2020   CREATININE 1.19 02/17/2020   BILITOT 0.6 02/17/2020   ALKPHOS 74 02/17/2020   AST  16 02/17/2020   ALT 26 02/17/2020   PROT 6.0 02/17/2020   ALBUMIN 4.2 02/17/2020   CALCIUM 9.6 02/17/2020   GFR 62.13 02/17/2020   Lab Results  Component Value Date   CHOL 188 02/17/2020   Lab Results  Component Value Date   HDL 39.30 02/17/2020   Lab Results  Component Value Date   LDLCALC 122 (H) 02/17/2020   Lab Results  Component Value Date   TRIG 133.0 02/17/2020   Lab Results  Component Value Date   CHOLHDL 5 02/17/2020   No results found for: HGBA1C    Assessment & Plan:   Problem List Items Addressed This Visit      Cardiovascular and Mediastinum   Essential hypertension   Relevant Medications   losartan (COZAAR) 50 MG tablet     Other   Depression with anxiety - Primary   Relevant Medications   DULoxetine (CYMBALTA) 60 MG capsule      Meds ordered this encounter  Medications  . losartan (COZAAR) 50 MG tablet    Sig: Take 1 tablet (50 mg total) by mouth daily.    Dispense:  90 tablet    Refill:  1  . DULoxetine (CYMBALTA) 60 MG capsule    Sig: Take 1 capsule (60 mg total) by mouth daily.    Dispense:  90 capsule    Refill:  4    Follow-up: Return follow up in the next 3-4 months for physical exam..   Have increased losartan to 50 mg daily.  Explained that this is more of a starting dose.  He should bring his blood pressure down nicely to a safer range.  We will follow-up in 3 to 4 months for physical exam.  Patient feels as though the duloxetine works well for him and admits that he might of answered the PHQ-9 more positively today if he ended been running out of medicine and having skipped doses. Libby Maw, MD

## 2020-12-25 DIAGNOSIS — M19012 Primary osteoarthritis, left shoulder: Secondary | ICD-10-CM | POA: Diagnosis not present

## 2020-12-25 DIAGNOSIS — M75102 Unspecified rotator cuff tear or rupture of left shoulder, not specified as traumatic: Secondary | ICD-10-CM | POA: Diagnosis not present

## 2020-12-26 ENCOUNTER — Encounter: Payer: BC Managed Care – PPO | Admitting: Rehabilitative and Restorative Service Providers"

## 2020-12-26 ENCOUNTER — Other Ambulatory Visit (HOSPITAL_COMMUNITY): Payer: Self-pay

## 2020-12-28 ENCOUNTER — Ambulatory Visit (INDEPENDENT_AMBULATORY_CARE_PROVIDER_SITE_OTHER): Payer: BC Managed Care – PPO | Admitting: Rehabilitative and Restorative Service Providers"

## 2020-12-28 ENCOUNTER — Other Ambulatory Visit: Payer: Self-pay

## 2020-12-28 ENCOUNTER — Encounter: Payer: Self-pay | Admitting: Rehabilitative and Restorative Service Providers"

## 2020-12-28 DIAGNOSIS — M6281 Muscle weakness (generalized): Secondary | ICD-10-CM | POA: Diagnosis not present

## 2020-12-28 DIAGNOSIS — R293 Abnormal posture: Secondary | ICD-10-CM | POA: Diagnosis not present

## 2020-12-28 DIAGNOSIS — R600 Localized edema: Secondary | ICD-10-CM

## 2020-12-28 DIAGNOSIS — M25511 Pain in right shoulder: Secondary | ICD-10-CM | POA: Diagnosis not present

## 2020-12-28 DIAGNOSIS — M25611 Stiffness of right shoulder, not elsewhere classified: Secondary | ICD-10-CM | POA: Diagnosis not present

## 2020-12-28 NOTE — Therapy (Signed)
[place], [place], [address] Phone: [phone]   Fax:  [phone]  Physical Therapy Treatment  Patient Details  Name: Benjamin Fields MRN: [id number] Date of Birth: Dec 30, 1960 Referring Provider (PT): Klifto, Merwyn Katos, MD   Encounter Date: 12/28/2020   PT End of Session - 12/28/20 1520    Visit Number 11    Number of Visits 30    Date for PT Re-Evaluation 01/12/21    Progress Note Due on Visit 15    PT Start Time 2703    PT Stop Time 1552    PT Time Calculation (min) 39 min    Activity Tolerance Patient tolerated treatment well;No increased pain    Behavior During Therapy WFL for tasks assessed/performed           Past Medical History:  Diagnosis Date  . Allergy    mild- no meds   . Anxiety   . Hypertension   . Neuromuscular disorder ([place])    mild nerve issue right leg   . RA (rheumatoid arthritis) ([place])     Past Surgical History:  Procedure Laterality Date  . CERVICAL SPINE SURGERY  2010  . COLONOSCOPY    . COLONOSCOPY  04/2020  . TONSILLECTOMY      There were no vitals filed for this visit.   Subjective Assessment - 12/28/20 1519    Subjective Pt. indicated no complaints of pain or troubles after last visit.  Pt. stated arm feels more relaxed and actually better than Lt arm at times.    Patient is accompained by: Family member    Pertinent History Lt RTC tear    Patient Stated Goals Return to normal R shoulder activities (fishing and gardening) without restriction    Currently in Pain? No/denies    Pain Score 0-No pain    Pain Onset More than a month ago              [facility] PT Assessment - 12/28/20 0001      Assessment   Medical Diagnosis Rt RTC    Referring Provider (PT) Klifto, Merwyn Katos, MD    Onset Date/Surgical Date 10/16/20    Hand Dominance Left      Strength   Strength Assessment Site Shoulder    Right/Left Shoulder Left;Right    Right Shoulder Flexion 4/5    11.9, 13.8 lbs   Right Shoulder ABduction 4/5   13.7, 12.2 lbs   Right Shoulder External Rotation 4/5   19.5, 21.5 lbs   Left Shoulder Flexion 3+/5   8.2, 10.6 lbs   Left Shoulder ABduction 3+/5   6.0, 7.3 lbs   Left Shoulder External Rotation 4+/5   21.6, 24.5 lbs                        [facility] Adult PT Treatment/Exercise - 12/28/20 0001      Blood Flow Restriction-Positions    Blood Flow Restriction Position comment    BFR comment LOP 166 mmHg, size 2      Shoulder Exercises: Standing   External Rotation Theraband;Right   3 x 10 c towel at side   Theraband Level (Shoulder External Rotation) Level 3 (Green)    Internal Rotation Theraband;Right   3 x 10 c towel at side   Theraband Level (Shoulder Internal Rotation) Level 3 (Green)    Flexion Right   BFR 82 mmHg x 30 0 lbs, 1 lb 30x, 3 x 15 0-90  degrees   ABduction Right   BFR 82 mmHg 30x 3 x 15 1 lb     Shoulder Exercises: ROM/Strengthening   UBE (Upper Arm Bike) Lvl 4 4 mins fwd/back each way c interval faster for 15 seconds at :45 to :60 each minute                  PT Education - 12/28/20 1534    Education Details HEP progression to standing, resistance band activity.    Person(s) Educated Patient    Methods Explanation;Demonstration;Verbal cues;Handout    Comprehension Verbalized understanding;Returned demonstration            PT Short Term Goals - 12/05/20 1537      PT SHORT TERM GOAL #1   Title Improve R shoulder PROM to 120 flexion; 50 IR and 60 degrees ER by the end of week 4 post-surgery    Baseline Protocol did not allow passive movement to listed goal - not applicable    Status Not Met   initial goal did not adhere to protocol provided by MD office after initial evaluation     PT SHORT TERM GOAL #2   Title Improve R shoulder PROM to 90 flexion; 50 IR and 30 degrees ER by the end of week 4 post-surgery    Time 1    Status Achieved    Target Date 11/20/20             PT Long Term  Goals - 12/28/20 1537      PT LONG TERM GOAL #1   Title Improve self-reported function on FOTO to 54.    Baseline 4    Time 12    Period Weeks    Status Achieved      PT LONG TERM GOAL #2   Title Improve R shoulder AROM for flexion to 170; ER to 90; IR to 60 and horizontal adduction to 40 degrees at DC.    Baseline See objective.  90% of the above expected at DC.    Time 12    Period Weeks    Status On-going    Target Date 01/12/21      PT LONG TERM GOAL #3   Title Pt. will demonstrate Rt shoulder strength 4+/5 or greater  throughout to facilitate lifting, carrying at PLOF s limitation.    Baseline Deferred due to 3 days post-surgery.    Time 3    Period Weeks    Status Revised    Target Date 01/12/21      PT LONG TERM GOAL #4   Title [name] will be independent with his long-term maintenence HEP at DC.    Time 12    Period Weeks    Status On-going    Target Date 01/12/21                 Plan - 12/28/20 1534    Clinical Impression Statement Documented strength testing for first time as protocol allows now.  Rt shoulder deficits noted from 5/5 norms but in areas better than Lt shoulder strength at this time. Progressed HEP appropriately to include further strengthening as performance has improved.  Continued skilled PT services to address strength deficits.    Examination-Activity Limitations Bathing;Dressing;Hygiene/Grooming;Bed Mobility;Carry;Reach Overhead    Examination-Participation Restrictions Interpersonal Relationship;Occupation;Driving;Community Activity;Cleaning    Stability/Clinical Decision Making Stable/Uncomplicated    Rehab Potential Good    PT Frequency --   adjust 1x/week untli strengthening, then 2x may be appropriate again  PT Duration 12 weeks    PT Treatment/Interventions ADLs/Self Care Home Management;Moist Heat;Cryotherapy;Therapeutic exercise;Neuromuscular re-education;Patient/family education;Therapeutic activities;Manual techniques;Passive  range of motion;Vasopneumatic Device    PT Next Visit Plan BFR use in active movement in gravity positioning for strengthening. Protocol still limits "excessive behind back movement."    PT Home Exercise Plan Access Code: [email or web address]    Consulted and Agree with Plan of Care Patient           Patient will benefit from skilled therapeutic intervention in order to improve the following deficits and impairments:  Decreased endurance,Decreased range of motion,Decreased strength,Increased edema,Impaired flexibility,Impaired UE functional use,Pain  Visit Diagnosis: Acute pain of right shoulder  Stiffness of right shoulder, not elsewhere classified  Muscle weakness (generalized)  Abnormal posture  Localized edema     Problem List Patient Active Problem List   Diagnosis Date Noted  . Depression with anxiety 12/22/2020  . Low back pain 02/17/2020  . Left hip pain 02/17/2020  . Healthcare maintenance 11/16/2019  . Rheumatoid arthritis of multiple sites with negative rheumatoid factor ([place]) 05/19/2018  . Tinea versicolor 05/19/2018  . Essential hypertension 03/17/2018  . DDD (degenerative disc disease), cervical 03/16/2018    Scot Jun, PT, DPT, OCS, ATC 12/28/20  3:42 PM    [facility] [address] [place], [place], [address] Phone: [phone]   Fax:  [phone]  Name: KEIMARI Menan MRN: [id number] Date of Birth: Jan 01, 1961

## 2020-12-28 NOTE — Patient Instructions (Signed)
Access Code: [email or web address] URL: [email or web address] Date: 12/28/2020 Prepared by: Scot Jun  Exercises Standing Scapular Retraction - 5 x daily - 7 x weekly - 1 sets - 5 reps - 5 second hold Supine Shoulder External Rotation with Dowel at 20 Degrees of Abduction - 2-3 x daily - 7 x weekly - 1 sets - 10 reps - 10 hold Supine Shoulder Internal Rotation Stretch - 1-2 x daily - 7 x weekly - 1 sets - 10-15 reps - 10 secondsinutes hold Sidelying Shoulder Flexion 15 Degrees - 2 x daily - 7 x weekly - 3 sets - 10 reps Standing Bilateral Low Shoulder Row with Anchored Resistance - 1 x daily - 7 x weekly - 2-3 sets - 15 reps Shoulder Extension with Resistance - 1 x daily - 7 x weekly - 2-3 sets - 15 reps Shoulder External Rotation with Anchored Resistance with Towel Under Elbow - 1 x daily - 7 x weekly - 3 sets - 10 reps Shoulder Internal Rotation with Resistance - 1 x daily - 7 x weekly - 3 sets - 10 reps Standing Shoulder Flexion to 90 Degrees with Dumbbells - 1 x daily - 7 x weekly - 3 sets - 10 reps Shoulder Abduction with Dumbbells - Thumbs Up - 1 x daily - 7 x weekly - 3 sets - 10 reps

## 2021-01-02 ENCOUNTER — Encounter: Payer: BC Managed Care – PPO | Admitting: Rehabilitative and Restorative Service Providers"

## 2021-01-04 ENCOUNTER — Encounter: Payer: Self-pay | Admitting: Rehabilitative and Restorative Service Providers"

## 2021-01-04 ENCOUNTER — Ambulatory Visit (INDEPENDENT_AMBULATORY_CARE_PROVIDER_SITE_OTHER): Payer: BC Managed Care – PPO | Admitting: Rehabilitative and Restorative Service Providers"

## 2021-01-04 ENCOUNTER — Other Ambulatory Visit: Payer: Self-pay

## 2021-01-04 DIAGNOSIS — M6281 Muscle weakness (generalized): Secondary | ICD-10-CM

## 2021-01-04 DIAGNOSIS — R293 Abnormal posture: Secondary | ICD-10-CM | POA: Diagnosis not present

## 2021-01-04 DIAGNOSIS — R600 Localized edema: Secondary | ICD-10-CM

## 2021-01-04 DIAGNOSIS — M25611 Stiffness of right shoulder, not elsewhere classified: Secondary | ICD-10-CM | POA: Diagnosis not present

## 2021-01-04 DIAGNOSIS — M25511 Pain in right shoulder: Secondary | ICD-10-CM | POA: Diagnosis not present

## 2021-01-04 NOTE — Therapy (Addendum)
[place], [place], [address] Phone: [phone]   Fax:  [phone]  Physical Therapy Treatment/Progress Note/Discharge Note  Patient Details  Name: Benjamin Fields MRN: [id number] Date of Birth: 18-Aug-1961 Referring Provider (PT): Klifto, Merwyn Katos, MD   Encounter Date: 01/04/2021   Progress Note Reporting Period 11/20/2020 to 01/04/2021  See note below for Objective Data and Assessment of Progress/Goals.        PT End of Session - 01/04/21 1422     Visit Number 12    Number of Visits 30    Date for PT Re-Evaluation 01/12/21    Progress Note Due on Visit 15    PT Start Time 5035    PT Stop Time 1451    PT Time Calculation (min) 29 min    Activity Tolerance Patient tolerated treatment well;No increased pain    Behavior During Therapy WFL for tasks assessed/performed             Past Medical History:  Diagnosis Date   Allergy    mild- no meds    Anxiety    Hypertension    Neuromuscular disorder ([place])    mild nerve issue right leg    RA (rheumatoid arthritis) ([place])     Past Surgical History:  Procedure Laterality Date   CERVICAL SPINE SURGERY  2010   COLONOSCOPY     COLONOSCOPY  04/2020   TONSILLECTOMY      There were no vitals filed for this visit.   Subjective Assessment - 01/04/21 1423     Subjective Pt. indicated no pain in Rt arm., returns to MD next week with discussion about results of Lt shoulder MRI testing.    Patient is accompained by: Family member    Pertinent History Lt RTC tear    Patient Stated Goals Return to normal R shoulder activities (fishing and gardening) without restriction    Currently in Pain? No/denies    Pain Onset More than a month ago                [facility] PT Assessment - 01/04/21 0001       Assessment   Medical Diagnosis Rt RTC    Referring Provider (PT) Klifto, Merwyn Katos, MD    Onset Date/Surgical Date 10/16/20    Hand Dominance  Left      AROM   Right Shoulder Flexion 146 Degrees   supine   Right Shoulder ABduction 165 Degrees   supine   Right Shoulder Internal Rotation 62 Degrees   measured in supine 60 deg abd   Right Shoulder External Rotation 75 Degrees   measured in supine 60 deg abd     Strength   Right Shoulder Flexion 4+/5   15.4, 17 lbs   Right Shoulder ABduction 4+/5   13.2, 16.3 lbs   Right Shoulder External Rotation 5/5   22.7, 24.1 lbs                          [facility] Adult PT Treatment/Exercise - 01/04/21 0001       Exercises   Other Exercises  HEP review for home      Shoulder Exercises: Standing   External Rotation Right;Theraband   2 x 15 c towel at side   Theraband Level (Shoulder External Rotation) Level 3 (Green)    Flexion Right   2 x 15 2 lbs   ABduction Right   2 x 15 2  lbs   Other Standing Exercises reactive isometric ER walk outs 5 sec hold x 10 c towel at side green band      Shoulder Exercises: ROM/Strengthening   UBE (Upper Arm Bike) Lvl 3.5 3 mins fwd/back each way                      PT Short Term Goals - 12/05/20 1537       PT SHORT TERM GOAL #1   Title Improve R shoulder PROM to 120 flexion; 50 IR and 60 degrees ER by the end of week 4 post-surgery    Baseline Protocol did not allow passive movement to listed goal - not applicable    Status Not Met   initial goal did not adhere to protocol provided by MD office after initial evaluation     PT SHORT TERM GOAL #2   Title Improve R shoulder PROM to 90 flexion; 50 IR and 30 degrees ER by the end of week 4 post-surgery    Time 1    Status Achieved    Target Date 11/20/20               PT Long Term Goals - 01/04/21 1437       PT LONG TERM GOAL #1   Title Improve self-reported function on FOTO to 54.    Baseline 4    Time 12    Period Weeks    Status Achieved      PT LONG TERM GOAL #2   Title Improve R shoulder AROM for flexion to 170; ER to 90; IR to 60 and horizontal  adduction to 40 degrees at DC.    Baseline See objective.  90% of the above expected at DC.    Time 12    Period Weeks    Status Partially Met      PT LONG TERM GOAL #3   Title Pt. will demonstrate Rt shoulder strength 4+/5 or greater  throughout to facilitate lifting, carrying at PLOF s limitation.    Baseline Deferred due to 3 days post-surgery.    Time 3    Period Weeks    Status Achieved      PT LONG TERM GOAL #4   Title Benjamin Fields will be independent with his long-term maintenence HEP at DC.    Time 12    Period Weeks    Status Achieved                   Plan - 01/04/21 1429     Clinical Impression Statement Pt. has attended 12 visits overall during course of treatment.  See objective data for updated information showing continued gains in strength and active mobility to this point in Rt shoulder.  Pt. has continued to report minimal pain symptoms in Rt arm and more limitation in daily activity from Lt arm vs. Rt arm at this time.  Pt. may could benefit from skilled PT services /HEP to continue to progress strength towards normal levels but may be appropriate for transitioning towards HEP.  Return to MD and adjustment of POC based off recommendations.    Examination-Activity Limitations Bathing;Dressing;Hygiene/Grooming;Bed Mobility;Carry;Reach Overhead    Examination-Participation Restrictions Interpersonal Relationship;Occupation;Driving;Community Activity;Cleaning    Stability/Clinical Decision Making Stable/Uncomplicated    Rehab Potential Good    PT Frequency --   adjust 1x/week untli strengthening, then 2x may be appropriate again   PT Duration 12 weeks    PT Treatment/Interventions ADLs/Self Care Home  Management;Moist Heat;Cryotherapy;Therapeutic exercise;Neuromuscular re-education;Patient/family education;Therapeutic activities;Manual techniques;Passive range of motion;Vasopneumatic Device    PT Next Visit Plan Return to MD next week.  Continued strengthening in  elevations for functional tasks in HEP, pending D/C possible    PT Home Exercise Plan Access Code: [email or web address]    Consulted and Agree with Plan of Care Patient             Patient will benefit from skilled therapeutic intervention in order to improve the following deficits and impairments:  Decreased endurance,Decreased range of motion,Decreased strength,Increased edema,Impaired flexibility,Impaired UE functional use,Pain  Visit Diagnosis: Acute pain of right shoulder  Stiffness of right shoulder, not elsewhere classified  Muscle weakness (generalized)  Abnormal posture  Localized edema     Problem List Patient Active Problem List   Diagnosis Date Noted   Depression with anxiety 12/22/2020   Low back pain 02/17/2020   Left hip pain 02/17/2020   Healthcare maintenance 11/16/2019   Rheumatoid arthritis of multiple sites with negative rheumatoid factor ([place]) 05/19/2018   Tinea versicolor 05/19/2018   Essential hypertension 03/17/2018   DDD (degenerative disc disease), cervical 03/16/2018    Scot Jun, PT, DPT, OCS, ATC 01/04/21  2:53 PM  PHYSICAL THERAPY DISCHARGE SUMMARY  Visits from Start of Care: 12  Current functional level related to goals / functional outcomes: See note   Remaining deficits: See note   Education / Equipment: HEP   Patient agrees to discharge. Patient goals were partially met. Patient is being discharged due to being pleased c current presentation.  Scot Jun, PT, DPT, OCS, ATC 02/06/21  10:40 AM     [facility] [address] [place], [place], [address] Phone: [phone]   Fax:  [phone]  Name: GARY GABRIELSEN MRN: [id number] Date of Birth: 04/25/1961

## 2021-01-08 ENCOUNTER — Other Ambulatory Visit (HOSPITAL_COMMUNITY): Payer: Self-pay

## 2021-01-08 DIAGNOSIS — M75102 Unspecified rotator cuff tear or rupture of left shoulder, not specified as traumatic: Secondary | ICD-10-CM | POA: Diagnosis not present

## 2021-01-09 ENCOUNTER — Encounter: Payer: BC Managed Care – PPO | Admitting: Rehabilitative and Restorative Service Providers"

## 2021-01-15 ENCOUNTER — Other Ambulatory Visit: Payer: Self-pay | Admitting: Family Medicine

## 2021-01-15 DIAGNOSIS — F418 Other specified anxiety disorders: Secondary | ICD-10-CM

## 2021-01-16 ENCOUNTER — Encounter: Payer: BC Managed Care – PPO | Admitting: Rehabilitative and Restorative Service Providers"

## 2021-01-18 ENCOUNTER — Encounter: Payer: BC Managed Care – PPO | Admitting: Rehabilitative and Restorative Service Providers"

## 2021-01-23 ENCOUNTER — Encounter: Payer: BC Managed Care – PPO | Admitting: Rehabilitative and Restorative Service Providers"

## 2021-01-25 ENCOUNTER — Encounter: Payer: BC Managed Care – PPO | Admitting: Rehabilitative and Restorative Service Providers"

## 2021-02-05 ENCOUNTER — Other Ambulatory Visit (HOSPITAL_COMMUNITY): Payer: Self-pay

## 2021-02-06 ENCOUNTER — Other Ambulatory Visit (HOSPITAL_COMMUNITY): Payer: Self-pay

## 2021-02-19 ENCOUNTER — Other Ambulatory Visit (HOSPITAL_COMMUNITY): Payer: Self-pay

## 2021-03-05 ENCOUNTER — Other Ambulatory Visit (HOSPITAL_COMMUNITY): Payer: Self-pay

## 2021-03-08 ENCOUNTER — Other Ambulatory Visit (HOSPITAL_COMMUNITY): Payer: Self-pay

## 2021-03-14 ENCOUNTER — Encounter: Payer: Self-pay | Admitting: Family Medicine

## 2021-03-14 ENCOUNTER — Other Ambulatory Visit: Payer: Self-pay

## 2021-03-14 ENCOUNTER — Ambulatory Visit (INDEPENDENT_AMBULATORY_CARE_PROVIDER_SITE_OTHER): Payer: BC Managed Care – PPO | Admitting: Family Medicine

## 2021-03-14 VITALS — BP 128/78 | HR 75 | Temp 97.3°F | Ht 70.0 in | Wt 194.0 lb

## 2021-03-14 DIAGNOSIS — F418 Other specified anxiety disorders: Secondary | ICD-10-CM

## 2021-03-14 DIAGNOSIS — R635 Abnormal weight gain: Secondary | ICD-10-CM | POA: Diagnosis not present

## 2021-03-14 DIAGNOSIS — I10 Essential (primary) hypertension: Secondary | ICD-10-CM

## 2021-03-14 DIAGNOSIS — Z0000 Encounter for general adult medical examination without abnormal findings: Secondary | ICD-10-CM

## 2021-03-14 DIAGNOSIS — R140 Abdominal distension (gaseous): Secondary | ICD-10-CM | POA: Diagnosis not present

## 2021-03-14 LAB — CBC
HCT: 44.9 % (ref 39.0–52.0)
Hemoglobin: 15.5 g/dL (ref 13.0–17.0)
MCHC: 34.6 g/dL (ref 30.0–36.0)
MCV: 95.9 fl (ref 78.0–100.0)
Platelets: 199 10*3/uL (ref 150.0–400.0)
RBC: 4.68 Mil/uL (ref 4.22–5.81)
RDW: 15.7 % — ABNORMAL HIGH (ref 11.5–15.5)
WBC: 5.8 10*3/uL (ref 4.0–10.5)

## 2021-03-14 LAB — URINALYSIS, ROUTINE W REFLEX MICROSCOPIC
Bilirubin Urine: NEGATIVE
Hgb urine dipstick: NEGATIVE
Ketones, ur: NEGATIVE
Leukocytes,Ua: NEGATIVE
Nitrite: NEGATIVE
RBC / HPF: NONE SEEN (ref 0–?)
Specific Gravity, Urine: 1.02 (ref 1.000–1.030)
Total Protein, Urine: NEGATIVE
Urine Glucose: NEGATIVE
Urobilinogen, UA: 0.2 (ref 0.0–1.0)
pH: 7 (ref 5.0–8.0)

## 2021-03-14 LAB — COMPREHENSIVE METABOLIC PANEL
ALT: 47 U/L (ref 0–53)
AST: 26 U/L (ref 0–37)
Albumin: 4.3 g/dL (ref 3.5–5.2)
Alkaline Phosphatase: 53 U/L (ref 39–117)
BUN: 16 mg/dL (ref 6–23)
CO2: 27 mEq/L (ref 19–32)
Calcium: 9.6 mg/dL (ref 8.4–10.5)
Chloride: 105 mEq/L (ref 96–112)
Creatinine, Ser: 1.28 mg/dL (ref 0.40–1.50)
GFR: 60.13 mL/min (ref >60.00)
Glucose, Bld: 102 mg/dL — ABNORMAL HIGH (ref 70–99)
Potassium: 4.5 mEq/L (ref 3.5–5.1)
Sodium: 139 mEq/L (ref 135–145)
Total Bilirubin: 1.3 mg/dL — ABNORMAL HIGH (ref 0.2–1.2)
Total Protein: 6 g/dL (ref 6.0–8.3)

## 2021-03-14 LAB — PSA: PSA: 1.53 ng/mL (ref 0.10–4.00)

## 2021-03-14 LAB — LIPID PANEL
Cholesterol: 190 mg/dL (ref 0–200)
HDL: 42.4 mg/dL (ref >39.00)
NonHDL: 147.95
Total CHOL/HDL Ratio: 4
Triglycerides: 242 mg/dL — ABNORMAL HIGH (ref 0.0–149.0)
VLDL: 48.4 mg/dL — ABNORMAL HIGH (ref 0.0–40.0)

## 2021-03-14 LAB — AMYLASE: Amylase: 63 U/L (ref 27–131)

## 2021-03-14 LAB — LDL CHOLESTEROL, DIRECT: Direct LDL: 117 mg/dL

## 2021-03-14 LAB — TSH: TSH: 1.99 u[IU]/mL (ref 0.35–5.50)

## 2021-03-14 NOTE — Progress Notes (Signed)
Established Patient Office Visit  Subjective:  Patient ID: Benjamin Fields, male    DOB: Jan 02, 1961  Age: 60 y.o. MRN: [id number]  CC:  Chief Complaint  Patient presents with   Annual Exam    CPE, patient states that he feels tired all the time. Fasting for labs.     HPI Benjamin Fields presents for physical exam and follow-up of depression.  He reports a sense of fatigue.  Not sure exactly what the causes.  He has gained 15 pounds over the last 7 to 8 months.  Does not feel much like exercising.  The only exercise he gets is mostly mowing his lawn once weekly inflammatory arthritis is reasonably controlled on his current medication schedule.  Depression is reasonably controlled with duloxetine he feels.  Denies stress at home or at work.  He works long hours up to 9 hours daily.  Does not smoke, use drugs or drink alcohol.  Reports increased anxiety and not sure why.  Past Medical History:  Diagnosis Date   Allergy    mild- no meds    Anxiety    Hypertension    Neuromuscular disorder ([place])    mild nerve issue right leg    RA (rheumatoid arthritis) ([facility])     Past Surgical History:  Procedure Laterality Date   CERVICAL SPINE SURGERY  2010   COLONOSCOPY     COLONOSCOPY  04/2020   TONSILLECTOMY      Family History  Problem Relation Age of Onset   Parkinson's disease Mother    Atrial fibrillation Mother    Heart failure Father    Hypertension Father    Prostate cancer Father    Colon cancer Neg Hx    Colon polyps Neg Hx    Esophageal cancer Neg Hx    Rectal cancer Neg Hx    Stomach cancer Neg Hx     Social History   Socioeconomic History   Marital status: Married    Spouse name: Not on file   Number of children: Not on file   Years of education: Not on file   Highest education level: Not on file  Occupational History   Not on file  Tobacco Use   Smoking status: Never   Smokeless tobacco: Never  Vaping Use   Vaping Use: Never used  Substance and Sexual  Activity   Alcohol use: Never   Drug use: Never   Sexual activity: Yes  Other Topics Concern   Not on file  Social History Narrative   Lives home with wife.  workd at [employer]  Education: Masters.  Children 2.  Caffeine 2 cups daily   Social Determinants of Health   Financial Resource Strain: Not on file  Food Insecurity: Not on file  Transportation Needs: Not on file  Physical Activity: Not on file  Stress: Not on file  Social Connections: Not on file  Intimate Partner Violence: Not on file    Outpatient Medications Prior to Visit  Medication Sig Dispense Refill   DULoxetine (CYMBALTA) 60 MG capsule Take 1 capsule by mouth once daily 90 capsule 1   folic acid (FOLVITE) 1 MG tablet Take 1 mg by mouth daily.     gabapentin (NEURONTIN) 100 MG capsule Take 1 capsule in am, 1 capsule at lunch and 3 capsules at bedtime. 450 capsule 3   losartan (COZAAR) 50 MG tablet Take 1 tablet (50 mg total) by mouth daily. 90 tablet 1   methotrexate 50 MG/2ML  injection Inject into the skin.     Multiple Vitamin (MULTIVITAMIN) tablet Take 1 tablet by mouth daily. OTC     Secukinumab, 300 MG Dose, (COSENTYX SENSOREADY, 300 MG,) 150 MG/ML SOAJ Inject 300mg  subcutaneously every 28 days 6 mL 3   Syringe/Needle, Disp, (B-D ECLIPSE SYRINGE) 27G X 1/2" 1 ML MISC Use 1 Syringe every 7 (seven) days For Methotrexate Injections     celecoxib (CELEBREX) 200 MG capsule Take 200 mg by mouth 2 (two) times daily.     triamcinolone cream (KENALOG) 0.1 % Apply topically 2 (two) times daily.     TUBERCULIN SYR 1CC/27GX1/2" (B-D TB SYRINGE 1CC/27GX1/2") 27G X 1/2" 1 ML MISC Use 1 syringe every 7 days for Methotrexate injections 12 each 3   No facility-administered medications prior to visit.    Allergies  Allergen Reactions   Cortisone     FLARE    ROS Review of Systems  Constitutional:  Positive for fatigue. Negative for diaphoresis, fever and unexpected weight change.  HENT: Negative.    Eyes:  Negative  for photophobia and visual disturbance.  Respiratory: Negative.    Cardiovascular: Negative.   Gastrointestinal:  Positive for abdominal distention. Negative for anal bleeding, blood in stool, constipation, diarrhea, nausea and vomiting.  Endocrine: Negative for polyphagia and polyuria.  Genitourinary:  Negative for difficulty urinating, frequency and urgency.  Musculoskeletal:  Negative for gait problem and joint swelling.  Neurological:  Negative for speech difficulty and weakness.  Psychiatric/Behavioral:  Positive for dysphoric mood. The patient is nervous/anxious.      Depression screen [facility] 2/9 03/14/2021 03/14/2021 12/22/2020  Decreased Interest 1 0 2  Down, Depressed, Hopeless 1 0 2  PHQ - 2 Score 2 0 4  Altered sleeping 0 - 1  Tired, decreased energy 3 - 3  Change in appetite 2 - 3  Feeling bad or failure about yourself  1 - 2  Trouble concentrating 2 - 1  Moving slowly or fidgety/restless 1 - 0  Suicidal thoughts 0 - 0  PHQ-9 Score 11 - 14  Difficult doing work/chores Somewhat difficult - Somewhat difficult     Objective:    Physical Exam Vitals and nursing note reviewed.  Constitutional:      General: He is not in acute distress.    Appearance: Normal appearance. He is not ill-appearing, toxic-appearing or diaphoretic.  HENT:     Head: Normocephalic and atraumatic.     Right Ear: Tympanic membrane, ear canal and external ear normal.     Left Ear: Tympanic membrane, ear canal and external ear normal.     Mouth/Throat:     Mouth: Mucous membranes are moist.     Pharynx: Oropharynx is clear. No oropharyngeal exudate or posterior oropharyngeal erythema.  Eyes:     General: No scleral icterus.       Right eye: No discharge.        Left eye: No discharge.     Extraocular Movements: Extraocular movements intact.     Conjunctiva/sclera: Conjunctivae normal.     Pupils: Pupils are equal, round, and reactive to light.  Neck:     Vascular: No carotid bruit.   Cardiovascular:     Rate and Rhythm: Normal rate and regular rhythm.  Pulmonary:     Effort: Pulmonary effort is normal.     Breath sounds: Normal breath sounds.  Abdominal:     General: Bowel sounds are normal. There is no distension.     Palpations: Abdomen is soft. There  is no mass.     Tenderness: There is no abdominal tenderness. There is no guarding or rebound.     Hernia: No hernia is present. There is no hernia in the left inguinal area or right inguinal area.  Genitourinary:    Penis: Circumcised. No hypospadias, erythema, tenderness, discharge, swelling or lesions.      Testes:        Right: Mass, tenderness or swelling not present. Right testis is descended.        Left: Mass, tenderness or swelling not present. Left testis is descended.     Epididymis:     Right: Not inflamed or enlarged.     Left: Not inflamed or enlarged.     Prostate: Not enlarged, not tender and no nodules present.     Rectum: Guaiac result negative. No mass, tenderness, anal fissure, external hemorrhoid or internal hemorrhoid. Normal anal tone.  Musculoskeletal:     Cervical back: No rigidity or tenderness.  Lymphadenopathy:     Cervical: No cervical adenopathy.     Lower Body: No right inguinal adenopathy. No left inguinal adenopathy.  Skin:    General: Skin is warm and dry.  Neurological:     Mental Status: He is alert and oriented to person, place, and time.  Psychiatric:        Mood and Affect: Mood normal.        Behavior: Behavior normal.    BP 128/78   Pulse 75   Temp (!) 97.3 F (36.3 C) (Temporal)   Ht 5\' 10"  (1.778 m)   Wt 194 lb (88 kg)   SpO2 96%   BMI 27.84 kg/m  Wt Readings from Last 3 Encounters:  03/14/21 194 lb (88 kg)  12/22/20 192 lb 12.8 oz (87.5 kg)  08/04/20 180 lb (81.6 kg)     Health Maintenance Due  Topic Date Due   Pneumococcal Vaccine 12-52 Years old (1 - PCV) Never done    There are no preventive care reminders to display for this patient.  Lab  Results  Component Value Date   TSH 1.03 03/17/2018   Lab Results  Component Value Date   WBC 8.3 02/17/2020   HGB 15.1 02/17/2020   HCT 43.6 02/17/2020   MCV 90.7 02/17/2020   PLT 248.0 02/17/2020   Lab Results  Component Value Date   NA 140 02/17/2020   K 4.5 02/17/2020   CO2 28 02/17/2020   GLUCOSE 102 (H) 02/17/2020   BUN 18 02/17/2020   CREATININE 1.19 02/17/2020   BILITOT 0.6 02/17/2020   ALKPHOS 74 02/17/2020   AST 16 02/17/2020   ALT 26 02/17/2020   PROT 6.0 02/17/2020   ALBUMIN 4.2 02/17/2020   CALCIUM 9.6 02/17/2020   GFR 62.13 02/17/2020   Lab Results  Component Value Date   CHOL 188 02/17/2020   Lab Results  Component Value Date   HDL 39.30 02/17/2020   Lab Results  Component Value Date   LDLCALC 122 (H) 02/17/2020   Lab Results  Component Value Date   TRIG 133.0 02/17/2020   Lab Results  Component Value Date   CHOLHDL 5 02/17/2020   No results found for: HGBA1C    Assessment & Plan:   Problem List Items Addressed This Visit       Cardiovascular and Mediastinum   Essential hypertension   Relevant Orders   CBC   Comprehensive metabolic panel   Urinalysis, Routine w reflex microscopic     Other  Healthcare maintenance   Relevant Orders   Lipid panel   PSA   Depression with anxiety - Primary   Relevant Orders   TSH   Abdominal bloating   Relevant Orders   CBC   Comprehensive metabolic panel   Amylase   Other Visit Diagnoses     Weight gain           No orders of the defined types were placed in this encounter.   Follow-up: Return in about 6 months (around 09/14/2021), or if symptoms worsen or fail to improve.  Patient encouraged to start an exercise program simply walking for 30 minutes 5 days weekly.  Encouraged to lose some weight.  Given information on exercising to lose weight as well as the Mediterranean diet.  Also given information on health maintenance and disease prevention.  Up-to-date on his health  maintenance.  Continue follow-up with rheumatology.  Briefly discussed increasing duloxetine.  May consider this or augmenting with Wellbutrin.  Libby Maw, MD

## 2021-03-15 NOTE — Progress Notes (Signed)
Labs were okay. Triglycerides or fat in blood in blood was elevated. Please lower fat and cholesterol in diet and consider taking 2 grams of fish oil daily.

## 2021-04-01 ENCOUNTER — Ambulatory Visit
Admission: EM | Admit: 2021-04-01 | Discharge: 2021-04-01 | Disposition: A | Discharge status: Home / Self Care | Payer: BC Managed Care – PPO

## 2021-04-01 DIAGNOSIS — J988 Other specified respiratory disorders: Secondary | ICD-10-CM | POA: Diagnosis not present

## 2021-04-01 DIAGNOSIS — M069 Rheumatoid arthritis, unspecified: Secondary | ICD-10-CM

## 2021-04-01 DIAGNOSIS — B9789 Other viral agents as the cause of diseases classified elsewhere: Secondary | ICD-10-CM | POA: Diagnosis not present

## 2021-04-01 DIAGNOSIS — Z20822 Contact with and (suspected) exposure to covid-19: Secondary | ICD-10-CM | POA: Diagnosis not present

## 2021-04-01 MED ORDER — CETIRIZINE HCL 10 MG PO TABS: 10.0000 mg | ORAL_TABLET | Freq: Every day | ORAL | 0 refills | Status: DC

## 2021-04-01 MED ORDER — PROMETHAZINE-DM 6.25-15 MG/5ML PO SYRP: 5.0000 mL | ORAL_SOLUTION | Freq: Every evening | ORAL | 0 refills | Status: DC | PRN

## 2021-04-01 MED ORDER — BENZONATATE 100 MG PO CAPS: 100.0000 mg | ORAL_CAPSULE | Freq: Three times a day (TID) | ORAL | 0 refills | Status: DC | PRN

## 2021-04-01 NOTE — ED Triage Notes (Signed)
Pt is on his second day of his prednisone tx and denies decrease in sxs. Pt placed on prednisone due to h/o arthritis. Onset yesterday of sore throat, cough and onset today of fever and joint pain.  Tmax 100.6. Negative at home covid test taken today. Pt has been taking OTC allergy meds with some relief.

## 2021-04-01 NOTE — Discharge Instructions (Addendum)
We will notify you of your COVID-19 test results as they arrive and may take between 24 to 48 hours.  I encourage you to sign up for MyChart if you have not already done so as this can be the easiest way for [place] to communicate results to you online or through a phone app.  In the meantime, if you develop worsening symptoms including fever, chest pain, shortness of breath despite our current treatment plan then please report to the emergency room as this may be a sign of worsening status from possible COVID-19 infection.  Otherwise, we will manage this as a viral syndrome. For sore throat or cough try using a honey-based tea. Use 3 teaspoons of honey with juice squeezed from half lemon. Place shaved pieces of ginger into 1/2-1 cup of water and warm over stove top. Then mix the ingredients and repeat every 4 hours as needed. Please take Tylenol '500mg'$ -'650mg'$  every 6 hours for aches and pains, fevers. Hydrate very well with at least 2 liters of water. Eat light meals such as soups to replenish electrolytes and soft fruits, veggies. Start an antihistamine like Zyrtec, Allegra or Claritin for postnasal drainage, sinus congestion.  You can take this together with pseudoephedrine (Sudafed) at a dose of 60 mg 2-3 times a day as needed for the same kind of congestion.  Make sure you do not take prednisone and Sudafed at the same time.

## 2021-04-01 NOTE — ED Provider Notes (Signed)
[place]   MRN: [id number] DOB: 04/26/1961  Subjective:   Benjamin Fields is a 60 y.o. male presenting for 1 day history of coughing, sore throat, fever, joint pains.  The joint pains are not necessarily new for the patient, reports history of rheumatoid arthritis of multiple sites.  He is actually being managed with a steroid course taper now as he has had trouble with this recently.  He is COVID vaccinated but no booster.  Would like to make sure that he gets checked for this and pneumonia.  Has been using over-the-counter allergy medications with good relief.  Denies chest pain, shortness of breath, wheezing.  No history of asthma or breathing disorders.  Patient is not a smoker, no alcohol use.  No current facility-administered medications for this encounter.  Current Outpatient Medications:    predniSONE (DELTASONE) 5 MG tablet, '30mg'$  mg every morning for 7 days, then 20 mg for 7 days, then 15 mg for 7 days, then 10 mg for 7 days, then 5 mg for 7 days, then stop., Disp: , Rfl:    DULoxetine (CYMBALTA) 60 MG capsule, Take 1 capsule by mouth once daily, Disp: 90 capsule, Rfl: 1   folic acid (FOLVITE) 1 MG tablet, Take 1 mg by mouth daily., Disp: , Rfl:    gabapentin (NEURONTIN) 100 MG capsule, Take 1 capsule in am, 1 capsule at lunch and 3 capsules at bedtime., Disp: 450 capsule, Rfl: 3   losartan (COZAAR) 50 MG tablet, Take 1 tablet (50 mg total) by mouth daily., Disp: 90 tablet, Rfl: 1   methotrexate 50 MG/2ML injection, Inject into the skin., Disp: , Rfl:    Multiple Vitamin (MULTIVITAMIN) tablet, Take 1 tablet by mouth daily. OTC, Disp: , Rfl:    Secukinumab, 300 MG Dose, (COSENTYX SENSOREADY, 300 MG,) 150 MG/ML SOAJ, Inject '300mg'$  subcutaneously every 28 days, Disp: 6 mL, Rfl: 3   Syringe/Needle, Disp, (B-D ECLIPSE SYRINGE) 27G X 1/2" 1 ML MISC, Use 1 Syringe every 7 (seven) days For Methotrexate Injections, Disp: , Rfl:    Allergies  Allergen Reactions   Cortisone      FLARE    Past Medical History:  Diagnosis Date   Allergy    mild- no meds    Anxiety    Hypertension    Neuromuscular disorder ([facility])    mild nerve issue right leg    RA (rheumatoid arthritis) ([facility])      Past Surgical History:  Procedure Laterality Date   CERVICAL SPINE SURGERY  2010   COLONOSCOPY     COLONOSCOPY  04/2020   TONSILLECTOMY      Family History  Problem Relation Age of Onset   Parkinson's disease Mother    Atrial fibrillation Mother    Heart failure Father    Hypertension Father    Prostate cancer Father    Colon cancer Neg Hx    Colon polyps Neg Hx    Esophageal cancer Neg Hx    Rectal cancer Neg Hx    Stomach cancer Neg Hx     Social History   Tobacco Use   Smoking status: Never   Smokeless tobacco: Never  Vaping Use   Vaping Use: Never used  Substance Use Topics   Alcohol use: Never   Drug use: Never    ROS   Objective:   Vitals: BP 126/73 (BP Location: Right Arm)   Pulse (!) 105   Temp 99.5 F (37.5 C) (Oral)   Resp 18  SpO2 96%   Physical Exam Constitutional:      General: He is not in acute distress.    Appearance: Normal appearance. He is well-developed. He is not ill-appearing, toxic-appearing or diaphoretic.  HENT:     Head: Normocephalic and atraumatic.     Right Ear: Tympanic membrane, ear canal and external ear normal.     Left Ear: Tympanic membrane, ear canal and external ear normal.     Nose: Nose normal. No congestion or rhinorrhea.     Mouth/Throat:     Mouth: Mucous membranes are moist.     Pharynx: Oropharynx is clear. No oropharyngeal exudate or posterior oropharyngeal erythema.  Eyes:     General: No scleral icterus.       Right eye: No discharge.        Left eye: No discharge.     Extraocular Movements: Extraocular movements intact.     Conjunctiva/sclera: Conjunctivae normal.     Pupils: Pupils are equal, round, and reactive to light.  Cardiovascular:     Rate and Rhythm: Normal rate and regular  rhythm.     Heart sounds: Normal heart sounds. No murmur heard.   No friction rub. No gallop.  Pulmonary:     Effort: Pulmonary effort is normal. No respiratory distress.     Breath sounds: Normal breath sounds. No stridor. No wheezing, rhonchi or rales.  Skin:    General: Skin is warm and dry.  Neurological:     Mental Status: He is alert and oriented to person, place, and time.  Psychiatric:        Mood and Affect: Mood normal.        Behavior: Behavior normal.        Thought Content: Thought content normal.        Judgment: Judgment normal.     Assessment and Plan :   PDMP not reviewed this encounter.  1. Viral respiratory illness   2. Rheumatoid arthritis involving multiple sites, unspecified whether rheumatoid factor present ([place])     COVID-19 testing pending, patient would be an excellent candidate for COVID antiviral should his test be positive.  Recommended supportive care.  Deferred imaging given clear cardiopulmonary exam, pulse oximetry of 96%.  Patient is to maintain the medications and recent regimen for management of his rheumatoid arthritis. Counseled patient on potential for adverse effects with medications prescribed/recommended today, ER and return-to-clinic precautions discussed, patient verbalized understanding.    Jaynee Eagles, PA-C 04/01/21 1435

## 2021-04-02 LAB — SARS-COV-2, NAA 2 DAY TAT

## 2021-04-02 LAB — NOVEL CORONAVIRUS, NAA: SARS-CoV-2, NAA: DETECTED — AB

## 2021-04-03 ENCOUNTER — Telehealth: Payer: Self-pay | Admitting: Family Medicine

## 2021-04-03 ENCOUNTER — Telehealth (HOSPITAL_COMMUNITY): Payer: Self-pay | Admitting: Emergency Medicine

## 2021-04-03 ENCOUNTER — Other Ambulatory Visit (HOSPITAL_COMMUNITY): Payer: Self-pay

## 2021-04-03 MED ORDER — NIRMATRELVIR/RITONAVIR (PAXLOVID)TABLET: 3.0000 | ORAL_TABLET | Freq: Two times a day (BID) | ORAL | 0 refills | Status: AC

## 2021-04-03 NOTE — Telephone Encounter (Signed)
Patient aware of message below and states that he would give urgent care a call. Per patient he will give [place] a call to schedule virtual if he's not able to reach no one at urgent care.

## 2021-04-03 NOTE — Telephone Encounter (Signed)
Please advise message below  °

## 2021-04-03 NOTE — Telephone Encounter (Signed)
Pt tested positive for Covid, wondering if he needs to take any meds to keep his immune system strong.. he mentioned other meds he was on that lower his immune system.

## 2021-04-03 NOTE — Telephone Encounter (Signed)
Antiviral okay'd  Hallie, APP Reviewed with patient interactions with prednisone and concern for  build up, [facility] encouraged patient to speak to and review with rheumatologist prior to beginning Paxlovid as patient is 4 days into a 6 week taper.   Also reviewed interactions with Losartan and monitoring BP while on Paxlovid.   Patient verbalized understanding, prescription sent

## 2021-04-05 ENCOUNTER — Other Ambulatory Visit (HOSPITAL_COMMUNITY): Payer: Self-pay

## 2021-04-05 ENCOUNTER — Telehealth: Payer: BC Managed Care – PPO | Admitting: Family Medicine

## 2021-04-10 ENCOUNTER — Telehealth: Payer: Self-pay | Admitting: Family Medicine

## 2021-04-10 NOTE — Telephone Encounter (Signed)
Pt called in complaining of an oxygen level of 91-93, it varies, fever and body aches. He has been covid positive since 04/01/21. I transferred over to Nurse Triage.

## 2021-04-10 NOTE — Telephone Encounter (Signed)
Returned patients call per patient he seems to be okay with breathing but seems to be tired all the time.

## 2021-04-11 NOTE — Telephone Encounter (Signed)
Appointment scheduled for eval

## 2021-04-12 ENCOUNTER — Encounter: Payer: Self-pay | Admitting: Family Medicine

## 2021-04-12 ENCOUNTER — Ambulatory Visit: Payer: BC Managed Care – PPO | Admitting: Family Medicine

## 2021-04-12 VITALS — Temp 97.3°F | Ht 70.0 in | Wt 194.0 lb

## 2021-04-12 DIAGNOSIS — E782 Mixed hyperlipidemia: Secondary | ICD-10-CM

## 2021-04-12 NOTE — Progress Notes (Signed)
Established Patient Office Visit  Subjective:  Patient ID: Benjamin Fields, male    DOB: [birth year]  Age: 60 y.o. MRN: [id number]  CC:  Chief Complaint  Patient presents with   Covid Positive    Covid positive on 04/01/2021 cough, fever, and very tired. Per patient symptoms starting to get better energy level rising still has a cough. Patient would like to know when can he start back taking     HPI Benjamin Fields presents for follow up of recent Covid infection. Feeling better with lingering fatigue. No recent fever. O2 sat 97%. Mild change in taste and smell. Denies wheeze. Occ cough non productive. No asthma hx.   Past Medical History:  Diagnosis Date   Allergy    mild- no meds    Anxiety    Hypertension    Neuromuscular disorder ([place])    mild nerve issue right leg    RA (rheumatoid arthritis) ([facility])     Past Surgical History:  Procedure Laterality Date   CERVICAL SPINE SURGERY  2010   COLONOSCOPY     COLONOSCOPY  04/2020   TONSILLECTOMY      Family History  Problem Relation Age of Onset   Parkinson's disease Mother    Atrial fibrillation Mother    Heart failure Father    Hypertension Father    Prostate cancer Father    Colon cancer Neg Hx    Colon polyps Neg Hx    Esophageal cancer Neg Hx    Rectal cancer Neg Hx    Stomach cancer Neg Hx     Social History   Socioeconomic History   Marital status: Married    Spouse name: Not on file   Number of children: Not on file   Years of education: Not on file   Highest education level: Not on file  Occupational History   Not on file  Tobacco Use   Smoking status: Never   Smokeless tobacco: Never  Vaping Use   Vaping Use: Never used  Substance and Sexual Activity   Alcohol use: Never   Drug use: Never   Sexual activity: Yes  Other Topics Concern   Not on file  Social History Narrative   Lives home with wife.  workd at [employer]  Education: Masters.  Children 2.  Caffeine 2 cups daily   Social  Determinants of Health   Financial Resource Strain: Not on file  Food Insecurity: Not on file  Transportation Needs: Not on file  Physical Activity: Not on file  Stress: Not on file  Social Connections: Not on file  Intimate Partner Violence: Not on file    Outpatient Medications Prior to Visit  Medication Sig Dispense Refill   benzonatate (TESSALON) 100 MG capsule Take 1-2 capsules (100-200 mg total) by mouth 3 (three) times daily as needed. 60 capsule 0   DULoxetine (CYMBALTA) 60 MG capsule Take 1 capsule by mouth once daily 90 capsule 1   gabapentin (NEURONTIN) 100 MG capsule Take 1 capsule in am, 1 capsule at lunch and 3 capsules at bedtime. 450 capsule 3   losartan (COZAAR) 50 MG tablet Take 1 tablet (50 mg total) by mouth daily. 90 tablet 1   Multiple Vitamin (MULTIVITAMIN) tablet Take 1 tablet by mouth daily. OTC     promethazine-dextromethorphan (PROMETHAZINE-DM) 6.25-15 MG/5ML syrup Take 5 mLs by mouth at bedtime as needed for cough. 100 mL 0   Secukinumab, 300 MG Dose, (COSENTYX SENSOREADY, 300 MG,) 150 MG/ML SOAJ Inject  $'300mg'u$  subcutaneously every 28 days 6 mL 3   Syringe/Needle, Disp, (B-D ECLIPSE SYRINGE) 27G X 1/2" 1 ML MISC Use 1 Syringe every 7 (seven) days For Methotrexate Injections     folic acid (FOLVITE) 1 MG tablet Take 1 mg by mouth daily. (Patient not taking: Reported on 04/12/2021)     methotrexate 50 MG/2ML injection Inject into the skin. (Patient not taking: Reported on 04/12/2021)     predniSONE (DELTASONE) 5 MG tablet '30mg'$  mg every morning for 7 days, then 20 mg for 7 days, then 15 mg for 7 days, then 10 mg for 7 days, then 5 mg for 7 days, then stop. (Patient not taking: Reported on 04/12/2021)     cetirizine (ZYRTEC ALLERGY) 10 MG tablet Take 1 tablet (10 mg total) by mouth daily. (Patient not taking: Reported on 04/12/2021) 30 tablet 0   No facility-administered medications prior to visit.    Allergies  Allergen Reactions   Cortisone     FLARE     ROS Review of Systems  Constitutional:  Positive for fatigue. Negative for chills, diaphoresis, fever and unexpected weight change.  HENT:  Negative for postnasal drip and sore throat.   Respiratory:  Positive for cough. Negative for shortness of breath and wheezing.   Cardiovascular: Negative.   Musculoskeletal:  Negative for arthralgias and myalgias.  Neurological:  Negative for speech difficulty and weakness.  Psychiatric/Behavioral: Negative.       Objective:    Physical Exam Vitals and nursing note reviewed.  Constitutional:      General: He is not in acute distress.    Appearance: Normal appearance. He is not ill-appearing, toxic-appearing or diaphoretic.  HENT:     Head: Normocephalic and atraumatic.  Eyes:     General: No scleral icterus.       Right eye: No discharge.        Left eye: No discharge.     Conjunctiva/sclera: Conjunctivae normal.  Pulmonary:     Effort: Pulmonary effort is normal.  Neurological:     Mental Status: He is alert and oriented to person, place, and time.    Temp (!) 97.3 F (36.3 C) (Oral)   Ht '5\' 10"'$  (1.778 m)   Wt 194 lb (88 kg)   SpO2 96%   BMI 27.84 kg/m  Wt Readings from Last 3 Encounters:  04/12/21 194 lb (88 kg)  03/14/21 194 lb (88 kg)  12/22/20 192 lb 12.8 oz (87.5 kg)     Health Maintenance Due  Topic Date Due   Pneumococcal Vaccine 84-68 Years old (1 - PCV) Never done   INFLUENZA VACCINE  03/26/2021    There are no preventive care reminders to display for this patient.  Lab Results  Component Value Date   TSH 1.99 03/14/2021   Lab Results  Component Value Date   WBC 5.8 03/14/2021   HGB 15.5 03/14/2021   HCT 44.9 03/14/2021   MCV 95.9 03/14/2021   PLT 199.0 03/14/2021   Lab Results  Component Value Date   NA 139 03/14/2021   K 4.5 03/14/2021   CO2 27 03/14/2021   GLUCOSE 102 (H) 03/14/2021   BUN 16 03/14/2021   CREATININE 1.28 03/14/2021   BILITOT 1.3 (H) 03/14/2021   ALKPHOS 53 03/14/2021    AST 26 03/14/2021   ALT 47 03/14/2021   PROT 6.0 03/14/2021   ALBUMIN 4.3 03/14/2021   CALCIUM 9.6 03/14/2021   GFR 60.13 03/14/2021   Lab Results  Component Value Date  CHOL 190 03/14/2021   Lab Results  Component Value Date   HDL 42.40 03/14/2021   Lab Results  Component Value Date   LDLCALC 122 (H) 02/17/2020   Lab Results  Component Value Date   TRIG 242.0 (H) 03/14/2021   Lab Results  Component Value Date   CHOLHDL 4 03/14/2021   No results found for: HGBA1C    Assessment & Plan:   Problem List Items Addressed This Visit       Other   Elevated triglycerides with high cholesterol - Primary    No orders of the defined types were placed in this encounter.   Follow-up: Return if symptoms worsen or fail to improve.   Overall he is doing much better with some lingering fatigue.  Asked him to be patient with this nerve that it can linger for some time.  He will get better.  He will let me know if he develops any increase in cough productive of phlegm with fever or has any difficulty breathing.  He may restart his current medications.  Advised him to go ahead and start from a fish oil for his elevated triglycerides and continue his weight loss journey.   Libby Maw, MD  Virtual Visit via Video Note  I connected with EVANS BILY on 04/12/21 at 11:30 AM EDT by a video enabled telemedicine application and verified that I am speaking with the correct person using two identifiers.  Location: Patient: at home with wife.  Provider: work   I discussed the limitations of evaluation and management by telemedicine and the availability of in person appointments. The patient expressed understanding and agreed to proceed.  History of Present Illness:    Observations/Objective:   Assessment and Plan:   Follow Up Instructions:    I discussed the assessment and treatment plan with the patient. The patient was provided an opportunity to ask questions  and all were answered. The patient agreed with the plan and demonstrated an understanding of the instructions.   The patient was advised to call back or seek an in-person evaluation if the symptoms worsen or if the condition fails to improve as anticipated.  I provided 20 minutes of non-face-to-face time during this encounter.   Libby Maw, MD

## 2021-04-16 ENCOUNTER — Other Ambulatory Visit (HOSPITAL_COMMUNITY): Payer: Self-pay

## 2021-04-20 ENCOUNTER — Other Ambulatory Visit (HOSPITAL_COMMUNITY): Payer: Self-pay

## 2021-05-09 DIAGNOSIS — M4690 Unspecified inflammatory spondylopathy, site unspecified: Secondary | ICD-10-CM | POA: Diagnosis not present

## 2021-05-09 DIAGNOSIS — Z79899 Other long term (current) drug therapy: Secondary | ICD-10-CM | POA: Diagnosis not present

## 2021-05-11 ENCOUNTER — Other Ambulatory Visit (HOSPITAL_COMMUNITY): Payer: Self-pay

## 2021-05-14 ENCOUNTER — Other Ambulatory Visit (HOSPITAL_COMMUNITY): Payer: Self-pay

## 2021-05-16 ENCOUNTER — Other Ambulatory Visit (HOSPITAL_COMMUNITY): Payer: Self-pay

## 2021-05-17 ENCOUNTER — Other Ambulatory Visit (HOSPITAL_COMMUNITY): Payer: Self-pay

## 2021-05-18 ENCOUNTER — Other Ambulatory Visit (HOSPITAL_COMMUNITY): Payer: Self-pay

## 2021-05-22 ENCOUNTER — Other Ambulatory Visit (HOSPITAL_COMMUNITY): Payer: Self-pay

## 2021-05-28 ENCOUNTER — Other Ambulatory Visit (HOSPITAL_COMMUNITY): Payer: Self-pay

## 2021-06-07 ENCOUNTER — Other Ambulatory Visit (HOSPITAL_COMMUNITY): Payer: Self-pay

## 2021-06-08 ENCOUNTER — Other Ambulatory Visit (HOSPITAL_COMMUNITY): Payer: Self-pay

## 2021-06-15 ENCOUNTER — Other Ambulatory Visit (HOSPITAL_COMMUNITY): Payer: Self-pay

## 2021-06-16 ENCOUNTER — Other Ambulatory Visit (HOSPITAL_COMMUNITY): Payer: Self-pay

## 2021-06-20 ENCOUNTER — Other Ambulatory Visit (HOSPITAL_COMMUNITY): Payer: Self-pay

## 2021-07-02 ENCOUNTER — Other Ambulatory Visit: Payer: Self-pay | Admitting: Family Medicine

## 2021-07-02 DIAGNOSIS — I10 Essential (primary) hypertension: Secondary | ICD-10-CM

## 2021-07-05 ENCOUNTER — Other Ambulatory Visit (HOSPITAL_COMMUNITY): Payer: Self-pay

## 2021-07-05 NOTE — Progress Notes (Signed)
Chief Complaint  Patient presents with   Follow-up    Rm 1, alone. Here for yearly f/u. Pt reports sx are about the same.       HISTORY OF PRESENT ILLNESS: 07/09/21 ALL: Benjamin Fields returns for follow up. He continues gabapentin 500mg  daily in split doses. Neuropathy and RLS symptoms are fairly stable. He does note more numbness in times of stress. He continues to follow with [facility] and on Cosentyx, methotrexate and duloxetine. Joint pain waxes and wanes. He is not sleeping as well as he would like. He reports that his wife wakes him at night due to pauses in his breathing. He does snore. He does not wake refreshed. He endorses a fair amount of daytime sleepiness.   07/03/2020 ALL: Benjamin Fields is a 60 y.o. male here today for follow up for neuropathy and RLS. We increased gabapentin dose to 100mg  in am, 100mg  at lunch and 300mg  at bedtime. He has also continued duloxetine 60mg  daily (rhematology). He feels that symptoms have improved. He does have a rare night where his legs seem more jumpy. He has correlated this to days where he has had more caffeine. He continues to note some tingling/numbness in both feet and rarely both hands. He continues to follow up with rheumatology for RA. Rinvoq was discontinued due to back pain. He was referred to Dr Melford Aase with [facility]. He has appt 12/27.    HISTORY (copied from my note on 12/29/2019)  Benjamin Fields is a 60 y.o. male here today for follow up for neuropathy and RLS. He continues duloxetine 60mg  and gabapentin 100mg  TID. He does feel that these medications help. He continues to note intermittent "jumping" of muscles. It is worse in the evenings when he sits in a recliner. Sometimes he feels that his whole body jumps. He does feel that caffeine makes RLS worse. He continues follow up with [facility]. He has stopped Rinvoq as it was stopped due to a cold in December. He felt that symptoms were improved off of Rinvoq and is now just  monitoring. He has noted burning and tingling of feet are better off medication. He has had some intermittent low back pain over the past few months. He was seen in UC and given "two shots" that have helped. He plans to see PCP in June.    HISTORY: (copied from my note on 07/10/2019)   Benjamin Fields is a 60 y.o. male here today for follow up for polyneuropathy. He continues Cymbalta 60mg  daily. He feels that this helps with his mood. Burning pain seems stable from last visit. Over the past 2-3 weeks, he has had more jerking and twitching of his legs. This worsens at night. Sometimes it wakes him at night. He is followed by rheumatology regularly. He is taking Otreup and Rinvoq for RA.      HISTORY: (copied from Dr Gladstone Lighter note on 09/04/2018)   60 year old male here for evaluation of numbness and tingling.   May 2019 patient had onset of left knee and right shoulder pain and swelling.  He was diagnosed with rheumatoid arthritis. He also had red eyes, dry eyes and generalized pain.  He has been started on methotrexate in July 2019 and Enbrel in November 2019.  He is also been on prednisone.    Early on his diagnosis he noticed some tingling sensation in his right hand.  He also notes that in his right leg.  He has mild numbness and tingling on  the left side as well.  No sensations in his face, neck or lower back.    REVIEW OF SYSTEMS: Out of a complete 14 system review of symptoms, the patient complains only of the following symptoms, chronic joint pain, numbness/tingling feet and hands, restless legs, daytime sleepiness, snoring, fatigue and all other reviewed systems are negative.  ESS: 14   ALLERGIES: Allergies  Allergen Reactions   Cortisone     FLARE     HOME MEDICATIONS: Outpatient Medications Prior to Visit  Medication Sig Dispense Refill   DULoxetine (CYMBALTA) 60 MG capsule Take 1 capsule by mouth once daily 90 capsule 1   folic acid (FOLVITE) 1 MG tablet Take 1 mg by  mouth daily.     losartan (COZAAR) 50 MG tablet Take 1 tablet by mouth once daily 90 tablet 0   methotrexate 50 MG/2ML injection Inject into the skin.     Multiple Vitamin (MULTIVITAMIN) tablet Take 1 tablet by mouth daily. OTC     Secukinumab, 300 MG Dose, (COSENTYX SENSOREADY, 300 MG,) 150 MG/ML SOAJ Inject 300mg  subcutaneously every 28 days 6 mL 3   Syringe/Needle, Disp, (B-D ECLIPSE SYRINGE) 27G X 1/2" 1 ML MISC Use 1 Syringe every 7 (seven) days For Methotrexate Injections     benzonatate (TESSALON) 100 MG capsule Take 1-2 capsules (100-200 mg total) by mouth 3 (three) times daily as needed. 60 capsule 0   gabapentin (NEURONTIN) 100 MG capsule Take 1 capsule in am, 1 capsule at lunch and 3 capsules at bedtime. 450 capsule 3   predniSONE (DELTASONE) 5 MG tablet 30mg  mg every morning for 7 days, then 20 mg for 7 days, then 15 mg for 7 days, then 10 mg for 7 days, then 5 mg for 7 days, then stop. (Patient not taking: Reported on 04/12/2021)     promethazine-dextromethorphan (PROMETHAZINE-DM) 6.25-15 MG/5ML syrup Take 5 mLs by mouth at bedtime as needed for cough. 100 mL 0   No facility-administered medications prior to visit.     PAST MEDICAL HISTORY: Past Medical History:  Diagnosis Date   Allergy    mild- no meds    Anxiety    Hypertension    Neuromuscular disorder ([place])    mild nerve issue right leg    RA (rheumatoid arthritis) ([place])      PAST SURGICAL HISTORY: Past Surgical History:  Procedure Laterality Date   CERVICAL SPINE SURGERY  2010   COLONOSCOPY     COLONOSCOPY  04/2020   TONSILLECTOMY       FAMILY HISTORY: Family History  Problem Relation Age of Onset   Parkinson's disease Mother    Atrial fibrillation Mother    Heart failure Father    Hypertension Father    Prostate cancer Father    Colon cancer Neg Hx    Colon polyps Neg Hx    Esophageal cancer Neg Hx    Rectal cancer Neg Hx    Stomach cancer Neg Hx      SOCIAL HISTORY: Social History    Socioeconomic History   Marital status: Married    Spouse name: Not on file   Number of children: Not on file   Years of education: Not on file   Highest education level: Not on file  Occupational History   Not on file  Tobacco Use   Smoking status: Never   Smokeless tobacco: Never  Vaping Use   Vaping Use: Never used  Substance and Sexual Activity   Alcohol use: Never  Drug use: Never   Sexual activity: Yes  Other Topics Concern   Not on file  Social History Narrative   Lives home with wife.  workd at [employer]  Education: Masters.  Children 2.  Caffeine 2 cups daily   Social Determinants of Health   Financial Resource Strain: Not on file  Food Insecurity: Not on file  Transportation Needs: Not on file  Physical Activity: Not on file  Stress: Not on file  Social Connections: Not on file  Intimate Partner Violence: Not on file      PHYSICAL EXAM  Vitals:   07/09/21 0813  BP: 136/83  Pulse: 73  Weight: 197 lb (89.4 kg)  Height: 5\' 10"  (1.778 m)    Body mass index is 28.27 kg/m.   Generalized: Well developed, in no acute distress   Cardiology: Normal rate and rhythm, no murmur auscultated Respiratory: Clear to auscultation bilaterally  Neurological examination  Mentation: Alert oriented to time, place, history taking. Follows all commands speech and language fluent Cranial nerve II-XII: Pupils were equal round reactive to light. Extraocular movements were full, visual field were full  Motor: The motor testing reveals 5 over 5 strength of all 4 extremities. Good symmetric motor tone is noted throughout.  Sensory: Sensory testing is intact to soft touch on all 4 extremities. No evidence of extinction is noted.  Gait and station: Gait is normal.     DIAGNOSTIC DATA (LABS, IMAGING, TESTING) - I reviewed patient records, labs, notes, testing and imaging myself where available.  Lab Results  Component Value Date   WBC 5.8 03/14/2021   HGB 15.5  03/14/2021   HCT 44.9 03/14/2021   MCV 95.9 03/14/2021   PLT 199.0 03/14/2021      Component Value Date/Time   NA 139 03/14/2021 0841   K 4.5 03/14/2021 0841   CL 105 03/14/2021 0841   CO2 27 03/14/2021 0841   GLUCOSE 102 (H) 03/14/2021 0841   BUN 16 03/14/2021 0841   CREATININE 1.28 03/14/2021 0841   CREATININE 1.21 10/22/2019 1411   CALCIUM 9.6 03/14/2021 0841   PROT 6.0 03/14/2021 0841   ALBUMIN 4.3 03/14/2021 0841   AST 26 03/14/2021 0841   ALT 47 03/14/2021 0841   ALKPHOS 53 03/14/2021 0841   BILITOT 1.3 (H) 03/14/2021 0841   GFRNONAA 65 10/22/2019 1411   GFRAA 75 10/22/2019 1411   Lab Results  Component Value Date   CHOL 190 03/14/2021   HDL 42.40 03/14/2021   LDLCALC 122 (H) 02/17/2020   LDLDIRECT 117.0 03/14/2021   TRIG 242.0 (H) 03/14/2021   CHOLHDL 4 03/14/2021   No results found for: HGBA1C Lab Results  Component Value Date   VITAMINB12 1,103 09/04/2018   Lab Results  Component Value Date   TSH 1.99 03/14/2021      ASSESSMENT AND PLAN  60 y.o. year old male  has a past medical history of Allergy, Anxiety, Hypertension, Neuromuscular disorder ([place]), and RA (rheumatoid arthritis) ([place]). here with   Polyneuropathy  RLS (restless legs syndrome)  Witnessed apneic spells - Plan: Ambulatory referral to Sleep Studies  Excessive daytime sleepiness  Doug reports that neuropathy and restless leg symptoms are stable at this time.  We will continue gabapentin 100 mg in the morning, 100 mg at lunch and 300 mg at bedtime.  He will also continue duloxetine 60 mg daily as currently prescribed by rheumatology.  I will send him to sleep med for sleep eval due to EDS and witnessed apneic  events. ESS 14. Healthy lifestyle habits reviewed.  He will follow-up with me pending sleep eval. He verbalizes understanding and agreement with this plan.   Debbora Presto, MSN, FNP-C 07/09/2021, 8:38 AM  [facility] [address], [place], [place]  [address] [phone]

## 2021-07-05 NOTE — Patient Instructions (Signed)
Below is our plan:  We will continue gabapentin up to 500mg  daily.   Please make sure you are staying well hydrated. I recommend 50-60 ounces daily. Well balanced diet and regular exercise encouraged. Consistent sleep schedule with 6-8 hours recommended.   Please continue follow up with care team as directed.   Follow up with me in 1 year   You may receive a survey regarding today's visit. I encourage you to leave honest feed back as I do use this information to improve patient care. Thank you for seeing me today!

## 2021-07-07 ENCOUNTER — Other Ambulatory Visit (HOSPITAL_COMMUNITY): Payer: Self-pay

## 2021-07-09 ENCOUNTER — Encounter: Payer: Self-pay | Admitting: Family Medicine

## 2021-07-09 ENCOUNTER — Ambulatory Visit: Payer: BC Managed Care – PPO | Admitting: Family Medicine

## 2021-07-09 VITALS — BP 136/83 | HR 73 | Ht 70.0 in | Wt 197.0 lb

## 2021-07-09 DIAGNOSIS — G4719 Other hypersomnia: Secondary | ICD-10-CM | POA: Diagnosis not present

## 2021-07-09 DIAGNOSIS — G629 Polyneuropathy, unspecified: Secondary | ICD-10-CM

## 2021-07-09 DIAGNOSIS — G2581 Restless legs syndrome: Secondary | ICD-10-CM | POA: Diagnosis not present

## 2021-07-09 DIAGNOSIS — R0681 Apnea, not elsewhere classified: Secondary | ICD-10-CM

## 2021-07-09 MED ORDER — GABAPENTIN 100 MG PO CAPS: ORAL_CAPSULE | ORAL | 3 refills | Status: DC

## 2021-07-10 ENCOUNTER — Other Ambulatory Visit (HOSPITAL_COMMUNITY): Payer: Self-pay

## 2021-07-13 ENCOUNTER — Other Ambulatory Visit (HOSPITAL_COMMUNITY): Payer: Self-pay

## 2021-07-22 ENCOUNTER — Other Ambulatory Visit: Payer: Self-pay | Admitting: Family Medicine

## 2021-07-22 DIAGNOSIS — F418 Other specified anxiety disorders: Secondary | ICD-10-CM

## 2021-08-01 ENCOUNTER — Other Ambulatory Visit (HOSPITAL_COMMUNITY): Payer: Self-pay

## 2021-08-10 ENCOUNTER — Other Ambulatory Visit (HOSPITAL_COMMUNITY): Payer: Self-pay

## 2021-08-14 ENCOUNTER — Other Ambulatory Visit (HOSPITAL_COMMUNITY): Payer: Self-pay

## 2021-08-14 DIAGNOSIS — M25512 Pain in left shoulder: Secondary | ICD-10-CM | POA: Diagnosis not present

## 2021-08-14 DIAGNOSIS — G629 Polyneuropathy, unspecified: Secondary | ICD-10-CM | POA: Diagnosis not present

## 2021-08-14 DIAGNOSIS — Z79899 Other long term (current) drug therapy: Secondary | ICD-10-CM | POA: Diagnosis not present

## 2021-08-14 DIAGNOSIS — M4690 Unspecified inflammatory spondylopathy, site unspecified: Secondary | ICD-10-CM | POA: Diagnosis not present

## 2021-08-14 DIAGNOSIS — M25511 Pain in right shoulder: Secondary | ICD-10-CM | POA: Diagnosis not present

## 2021-08-14 MED ORDER — COSENTYX SENSOREADY (300 MG) 150 MG/ML ~~LOC~~ SOAJ
SUBCUTANEOUS | 3 refills | Status: DC
  Filled 2021-09-07: qty 2, 28d supply, fill #0
  Filled 2021-10-05: qty 2, 28d supply, fill #1
  Filled 2021-10-25: qty 2, 28d supply, fill #2

## 2021-08-29 ENCOUNTER — Other Ambulatory Visit (HOSPITAL_COMMUNITY): Payer: Self-pay

## 2021-09-07 ENCOUNTER — Other Ambulatory Visit (HOSPITAL_COMMUNITY): Payer: Self-pay

## 2021-09-24 ENCOUNTER — Other Ambulatory Visit (HOSPITAL_COMMUNITY): Payer: Self-pay

## 2021-09-25 ENCOUNTER — Institutional Professional Consult (permissible substitution): Payer: BC Managed Care – PPO | Admitting: Neurology

## 2021-10-01 ENCOUNTER — Other Ambulatory Visit: Payer: Self-pay | Admitting: Family Medicine

## 2021-10-01 ENCOUNTER — Other Ambulatory Visit: Payer: Self-pay

## 2021-10-01 ENCOUNTER — Ambulatory Visit: Payer: BC Managed Care – PPO | Admitting: Neurology

## 2021-10-01 ENCOUNTER — Encounter: Payer: Self-pay | Admitting: Neurology

## 2021-10-01 VITALS — BP 134/81 | HR 72 | Ht 70.0 in | Wt 193.2 lb

## 2021-10-01 DIAGNOSIS — G4719 Other hypersomnia: Secondary | ICD-10-CM | POA: Diagnosis not present

## 2021-10-01 DIAGNOSIS — E663 Overweight: Secondary | ICD-10-CM

## 2021-10-01 DIAGNOSIS — L4050 Arthropathic psoriasis, unspecified: Secondary | ICD-10-CM

## 2021-10-01 DIAGNOSIS — R0683 Snoring: Secondary | ICD-10-CM

## 2021-10-01 DIAGNOSIS — I10 Essential (primary) hypertension: Secondary | ICD-10-CM

## 2021-10-01 DIAGNOSIS — G4761 Periodic limb movement disorder: Secondary | ICD-10-CM

## 2021-10-01 DIAGNOSIS — G2581 Restless legs syndrome: Secondary | ICD-10-CM

## 2021-10-01 DIAGNOSIS — R0681 Apnea, not elsewhere classified: Secondary | ICD-10-CM

## 2021-10-01 NOTE — Patient Instructions (Signed)

## 2021-10-01 NOTE — Progress Notes (Signed)
Subjective:    Patient ID: Benjamin Fields is a 61 y.o. male.  HPI    Star Age, MD, PhD [facility] [address] [place], [place] [address]  Dear Warren Lacy and Bonnita Levan,   I saw your patient, Benjamin Fields, upon your kind request, in my sleep clinic today for initial consultation of his sleep disorder, in particular, concern for underlying obstructive sleep apnea.  The patient is unaccompanied today.  As you know, [name] is a 62 year old right-handed gentleman with an underlying medical history of psoriatic arthritis, neuropathy, restless leg syndrome, hypertension, allergies, anxiety, and overweight state, who reports snoring and excessive daytime somnolence as well as witnessed apneas per wife's report.  I reviewed your office note from 07/09/2021.  His Epworth sleepiness score is 20 out of 24, fatigue severity score is 54 out of 63. Of note, he is on Cymbalta and gabapentin, albeit with lower doses for both medicines.  He is able to sleep sleeper, has had no systemic symptoms which improved when he started gabapentin.  He has a history of twitching his legs and kicking while asleep.  His wife is complained about his leg movements in the past.  He has no family history of sleep apnea.  His snoring is disturbing his wife.  He has on a rare occasion woken up with a sense of gasping for air.  He denies nighttime nocturia or recurrent morning headaches.  Bedtime is generally between 9:30 PM and 10 PM and rise time between 5:30 AM and 6 AM.  He does not watch TV in the bedroom.  They have a small dog in the household, the dog crate in the bedroom, not on the bed.  The patient is married and lives with his wife, he works in Engineer, mining, they have 2 grown children.  He likes to drink caffeine in the form of coffee, 2 to 3 cups before lunchtime and 1 soda with lunch.  He drinks alcohol rarely.  He is a non-smoker.  His Past Medical History Is Significant  For: Past Medical History:  Diagnosis Date   Allergy    mild- no meds    Anxiety    Hypertension    Neuromuscular disorder ([place])    mild nerve issue right leg    RA (rheumatoid arthritis) (HCC)     His Past Surgical History Is Significant For: Past Surgical History:  Procedure Laterality Date   CERVICAL SPINE SURGERY  2010   COLONOSCOPY     COLONOSCOPY  04/2020   TONSILLECTOMY      His Family History Is Significant For: Family History  Problem Relation Age of Onset   Parkinson's disease Mother    Atrial fibrillation Mother    Heart failure Father    Hypertension Father    Prostate cancer Father    Colon cancer Neg Hx    Colon polyps Neg Hx    Esophageal cancer Neg Hx    Rectal cancer Neg Hx    Stomach cancer Neg Hx     His Social History Is Significant For: Social History   Socioeconomic History   Marital status: Married    Spouse name: Not on file   Number of children: Not on file   Years of education: Not on file   Highest education level: Not on file  Occupational History   Not on file  Tobacco Use   Smoking status: Never   Smokeless tobacco: Never  Vaping Use   Vaping  Use: Never used  Substance and Sexual Activity   Alcohol use: Never   Drug use: Never   Sexual activity: Yes  Other Topics Concern   Not on file  Social History Narrative   Lives home with wife.  workd at [employer]  Education: Masters.  Children 2.  Caffeine 2 cups daily   Social Determinants of Health   Financial Resource Strain: Not on file  Food Insecurity: Not on file  Transportation Needs: Not on file  Physical Activity: Not on file  Stress: Not on file  Social Connections: Not on file    His Allergies Are:  Allergies  Allergen Reactions   Cortisone     FLARE  :   His Current Medications Are:  Outpatient Encounter Medications as of 10/01/2021  Medication Sig   DULoxetine (CYMBALTA) 60 MG capsule Take 1 capsule by mouth once daily   folic acid (FOLVITE) 1 MG tablet  Take 1 mg by mouth daily.   gabapentin (NEURONTIN) 100 MG capsule Take 1 capsule in am, 1 capsule at lunch and 3 capsules at bedtime.   losartan (COZAAR) 50 MG tablet Take 1 tablet by mouth once daily   methotrexate 50 MG/2ML injection Inject into the skin.   Multiple Vitamin (MULTIVITAMIN) tablet Take 1 tablet by mouth daily. OTC   Secukinumab, 300 MG Dose, (COSENTYX SENSOREADY, 300 MG,) 150 MG/ML SOAJ Inject 300 mg subcutaneously every 28 (twenty-eight) days   Syringe/Needle, Disp, (B-D ECLIPSE SYRINGE) 27G X 1/2" 1 ML MISC Use 1 Syringe every 7 (seven) days For Methotrexate Injections   [DISCONTINUED] losartan (COZAAR) 50 MG tablet Take 1 tablet by mouth once daily   [DISCONTINUED] Secukinumab, 300 MG Dose, (COSENTYX SENSOREADY, 300 MG,) 150 MG/ML SOAJ Inject 300mg  subcutaneously every 28 days   No facility-administered encounter medications on file as of 10/01/2021.  :   Review of Systems:  Out of a complete 14 point review of systems, all are reviewed and negative with the exception of these symptoms as listed below:  Review of Systems  Neurological:        Internal referral for witness apenic spells, more frequent spells. Daytime sleepiness (later afternoon).  Drinks 3 cups coffee in AM  ESS. 20, FSS.54.    Objective:  Neurological Exam  Physical Exam Physical Examination:   Vitals:   10/01/21 0858  BP: 134/81  Pulse: 72    General Examination: The patient is a very pleasant 61 y.o. male in no acute distress. He appears well-developed and well-nourished and well groomed.   HEENT: Normocephalic, atraumatic, pupils are equal, round and reactive to light, extraocular tracking is good without limitation to gaze excursion or nystagmus noted. Hearing is grossly intact. Face is symmetric with normal facial animation. Speech is clear with no dysarthria noted. There is no hypophonia. There is no lip, neck/head, jaw or voice tremor. Neck is supple with full range of passive and active  motion. There are no carotid bruits on auscultation. Oropharynx exam reveals: mild mouth dryness, adequate dental hygiene and with partial dentures upper and lower jaw.  Moderate airway crowding secondary to redundant soft palate and water uvula.  Tonsils are absent.  Mallampati class III.  Neck circumference of 16-7/8 inches.  He has a minimal overbite.  Tongue protrudes centrally and palate elevates symmetrically.  Chest: Clear to auscultation without wheezing, rhonchi or crackles noted.  Heart: S1+S2+0, regular and normal without murmurs, rubs or gallops noted.   Abdomen: Soft, non-tender and non-distended with normal bowel sounds  appreciated on auscultation.  Extremities: There is no pitting edema in the distal lower extremities bilaterally.   Skin: Warm and dry without trophic changes noted.   Musculoskeletal: exam reveals no obvious joint deformities.   Neurologically:  Mental status: The patient is awake, alert and oriented in all 4 spheres. His immediate and remote memory, attention, language skills and fund of knowledge are appropriate. There is no evidence of aphasia, agnosia, apraxia or anomia. Speech is clear with normal prosody and enunciation. Thought process is linear. Mood is normal and affect is normal.  Cranial nerves II - XII are as described above under HEENT exam.  Motor exam: Normal bulk, strength and tone is noted. There is no tremor. Fine motor skills and coordination: grossly intact.  Cerebellar testing: No dysmetria or intention tremor. There is no truncal or gait ataxia.  Sensory exam: intact to light touch in the upper and lower extremities.  Gait, station and balance: He stands easily. No veering to one side is noted. No leaning to one side is noted. Posture is age-appropriate and stance is narrow based. Gait shows normal stride length and normal pace. No problems turning are noted.   Assessment and Plan:  In summary, JONATHAN CORPUS is a very pleasant 61  y.o.-year old male with an underlying medical history of psoriatic arthritis, neuropathy, restless leg syndrome, hypertension, allergies, anxiety, and overweight state, whose history and physical exam are concerning for obstructive sleep apnea (OSA). I had a long chat with the patient about my findings and the diagnosis of OSA, its prognosis and treatment options. We talked about medical treatments, surgical interventions and non-pharmacological approaches. I explained in particular the risks and ramifications of untreated moderate to severe OSA, especially with respect to developing cardiovascular disease down the Road, including congestive heart failure, difficult to treat hypertension, cardiac arrhythmias, or stroke. Even type 2 diabetes has, in part, been linked to untreated OSA. Symptoms of untreated OSA include daytime sleepiness, memory problems, mood irritability and mood disorder such as depression and anxiety, lack of energy, as well as recurrent headaches, especially morning headaches. We talked about trying to maintain a healthy lifestyle in general, as well as the importance of weight control. We also talked about the importance of good sleep hygiene. I recommended the following at this time: sleep study. I explained the sleep test procedure to the patient and also outlined possible surgical and non-surgical treatment options of OSA, including the use of a custom-made dental device (which would require a referral to a specialist dentist or oral surgeon), upper airway surgical options, such as traditional UPPP or a novel less invasive surgical option in the form of Inspire hypoglossal nerve stimulation (which would involve a referral to an ENT surgeon). I also explained the CPAP treatment option to the patient, who indicated that he would be willing to try CPAP if the need arises. I explained the importance of being compliant with PAP treatment, not only for insurance purposes but primarily to improve  His symptoms, and for the patient's long term health benefit, including to reduce His cardiovascular risks. I answered all his questions today and the patient was in agreement. I plan to see him back after the sleep study is completed and encouraged him to call with any interim questions, concerns, problems or updates.   Thank you very much for allowing me to participate in the care of this nice patient. If I can be of any further assistance to you please do not hesitate to talk  to me.  Sincerely,   Star Age, MD, PhD

## 2021-10-05 ENCOUNTER — Other Ambulatory Visit (HOSPITAL_COMMUNITY): Payer: Self-pay

## 2021-10-08 DIAGNOSIS — L814 Other melanin hyperpigmentation: Secondary | ICD-10-CM | POA: Diagnosis not present

## 2021-10-08 DIAGNOSIS — L578 Other skin changes due to chronic exposure to nonionizing radiation: Secondary | ICD-10-CM | POA: Diagnosis not present

## 2021-10-08 DIAGNOSIS — L821 Other seborrheic keratosis: Secondary | ICD-10-CM | POA: Diagnosis not present

## 2021-10-10 NOTE — Telephone Encounter (Signed)
Called patient to schedule follow up appointment no answer LMTCB to schedule appointment.

## 2021-10-17 ENCOUNTER — Ambulatory Visit (INDEPENDENT_AMBULATORY_CARE_PROVIDER_SITE_OTHER): Payer: BC Managed Care – PPO | Admitting: Neurology

## 2021-10-17 DIAGNOSIS — R0683 Snoring: Secondary | ICD-10-CM

## 2021-10-17 DIAGNOSIS — R0681 Apnea, not elsewhere classified: Secondary | ICD-10-CM

## 2021-10-17 DIAGNOSIS — G4733 Obstructive sleep apnea (adult) (pediatric): Secondary | ICD-10-CM | POA: Diagnosis not present

## 2021-10-17 DIAGNOSIS — G4761 Periodic limb movement disorder: Secondary | ICD-10-CM

## 2021-10-17 DIAGNOSIS — E663 Overweight: Secondary | ICD-10-CM

## 2021-10-17 DIAGNOSIS — L4050 Arthropathic psoriasis, unspecified: Secondary | ICD-10-CM

## 2021-10-17 DIAGNOSIS — G4719 Other hypersomnia: Secondary | ICD-10-CM

## 2021-10-17 DIAGNOSIS — G2581 Restless legs syndrome: Secondary | ICD-10-CM

## 2021-10-18 ENCOUNTER — Ambulatory Visit (INDEPENDENT_AMBULATORY_CARE_PROVIDER_SITE_OTHER): Payer: BC Managed Care – PPO | Admitting: Family Medicine

## 2021-10-18 ENCOUNTER — Other Ambulatory Visit: Payer: Self-pay

## 2021-10-18 ENCOUNTER — Encounter: Payer: Self-pay | Admitting: Family Medicine

## 2021-10-18 VITALS — BP 130/78 | HR 73 | Temp 98.0°F | Ht 70.0 in | Wt 192.0 lb

## 2021-10-18 DIAGNOSIS — R0683 Snoring: Secondary | ICD-10-CM | POA: Diagnosis not present

## 2021-10-18 DIAGNOSIS — F418 Other specified anxiety disorders: Secondary | ICD-10-CM

## 2021-10-18 MED ORDER — DULOXETINE HCL 60 MG PO CPEP: 60.0000 mg | ORAL_CAPSULE | Freq: Every day | ORAL | 4 refills | Status: DC

## 2021-10-18 NOTE — Progress Notes (Signed)
Established Patient Office Visit  Subjective:  Patient ID: Benjamin Fields, male    DOB: [birth year]  Age: 61 y.o. MRN: [id number]  CC:  Chief Complaint  Patient presents with   Follow-up    Refill/follow up on medication. No concerns     HPI Benjamin Fields presents for follow-up of depression with anxiety.  Doing well with Cymbalta.  Definitely feels as though mood has been elevated.  Still has down spells but they are manageable.  No issues taking the medication.  Continues to work long hours.  Is getting some exercise.  Is planning on gardening this spring.  Status post sleep study last night at home.  Past Medical History:  Diagnosis Date   Allergy    mild- no meds    Anxiety    Hypertension    Neuromuscular disorder ([place])    mild nerve issue right leg    RA (rheumatoid arthritis) (HCC)     Past Surgical History:  Procedure Laterality Date   CERVICAL SPINE SURGERY  2010   COLONOSCOPY     COLONOSCOPY  04/2020   TONSILLECTOMY      Family History  Problem Relation Age of Onset   Parkinson's disease Mother    Atrial fibrillation Mother    Heart failure Father    Hypertension Father    Prostate cancer Father    Colon cancer Neg Hx    Colon polyps Neg Hx    Esophageal cancer Neg Hx    Rectal cancer Neg Hx    Stomach cancer Neg Hx     Social History   Socioeconomic History   Marital status: Married    Spouse name: Not on file   Number of children: Not on file   Years of education: Not on file   Highest education level: Not on file  Occupational History   Not on file  Tobacco Use   Smoking status: Never   Smokeless tobacco: Never  Vaping Use   Vaping Use: Never used  Substance and Sexual Activity   Alcohol use: Never   Drug use: Never   Sexual activity: Yes  Other Topics Concern   Not on file  Social History Narrative   Lives home with wife.  workd at [employer]  Education: Masters.  Children 2.  Caffeine 2 cups daily   Social Determinants of  Health   Financial Resource Strain: Not on file  Food Insecurity: Not on file  Transportation Needs: Not on file  Physical Activity: Not on file  Stress: Not on file  Social Connections: Not on file  Intimate Partner Violence: Not on file    Outpatient Medications Prior to Visit  Medication Sig Dispense Refill   folic acid (FOLVITE) 1 MG tablet Take 1 mg by mouth daily.     gabapentin (NEURONTIN) 100 MG capsule Take 1 capsule in am, 1 capsule at lunch and 3 capsules at bedtime. 450 capsule 3   losartan (COZAAR) 50 MG tablet Take 1 tablet by mouth once daily 90 tablet 0   methotrexate 50 MG/2ML injection Inject into the skin.     Multiple Vitamin (MULTIVITAMIN) tablet Take 1 tablet by mouth daily. OTC     Secukinumab, 300 MG Dose, (COSENTYX SENSOREADY, 300 MG,) 150 MG/ML SOAJ Inject 300 mg subcutaneously every 28 (twenty-eight) days 6 mL 3   Syringe/Needle, Disp, (B-D ECLIPSE SYRINGE) 27G X 1/2" 1 ML MISC Use 1 Syringe every 7 (seven) days For Methotrexate Injections  DULoxetine (CYMBALTA) 60 MG capsule Take 1 capsule by mouth once daily 90 capsule 0   No facility-administered medications prior to visit.    Allergies  Allergen Reactions   Cortisone     FLARE    ROS Review of Systems  Constitutional: Negative.   Respiratory: Negative.    Cardiovascular: Negative.   Gastrointestinal: Negative.      Depression screen [facility] 2/9 10/18/2021 10/18/2021 04/12/2021  Decreased Interest 1 0 0  Down, Depressed, Hopeless 1 0 0  PHQ - 2 Score 2 0 0  Altered sleeping 1 - -  Tired, decreased energy 1 - -  Change in appetite 1 - -  Feeling bad or failure about yourself  1 - -  Trouble concentrating 0 - -  Moving slowly or fidgety/restless 0 - -  Suicidal thoughts 0 - -  PHQ-9 Score 6 - -  Difficult doing work/chores Somewhat difficult - -     Objective:    Physical Exam Vitals and nursing note reviewed.  Constitutional:      General: He is not in acute distress.     Appearance: Normal appearance. He is not ill-appearing, toxic-appearing or diaphoretic.  HENT:     Head: Normocephalic and atraumatic.  Eyes:     General: No scleral icterus.       Right eye: No discharge.        Left eye: No discharge.     Extraocular Movements: Extraocular movements intact.     Conjunctiva/sclera: Conjunctivae normal.  Pulmonary:     Effort: Pulmonary effort is normal.  Neurological:     Mental Status: He is alert and oriented to person, place, and time.  Psychiatric:        Mood and Affect: Mood normal.        Behavior: Behavior normal.    BP 130/78 (BP Location: Left Arm, Patient Position: Sitting, Cuff Size: Large)    Pulse 73    Temp 98 F (36.7 C) (Temporal)    Ht 5\' 10"  (1.778 m)    Wt 192 lb (87.1 kg)    SpO2 96%    BMI 27.55 kg/m  Wt Readings from Last 3 Encounters:  10/18/21 192 lb (87.1 kg)  10/01/21 193 lb 3.2 oz (87.6 kg)  07/09/21 197 lb (89.4 kg)     There are no preventive care reminders to display for this patient.  There are no preventive care reminders to display for this patient.  Lab Results  Component Value Date   TSH 1.99 03/14/2021   Lab Results  Component Value Date   WBC 5.8 03/14/2021   HGB 15.5 03/14/2021   HCT 44.9 03/14/2021   MCV 95.9 03/14/2021   PLT 199.0 03/14/2021   Lab Results  Component Value Date   NA 139 03/14/2021   K 4.5 03/14/2021   CO2 27 03/14/2021   GLUCOSE 102 (H) 03/14/2021   BUN 16 03/14/2021   CREATININE 1.28 03/14/2021   BILITOT 1.3 (H) 03/14/2021   ALKPHOS 53 03/14/2021   AST 26 03/14/2021   ALT 47 03/14/2021   PROT 6.0 03/14/2021   ALBUMIN 4.3 03/14/2021   CALCIUM 9.6 03/14/2021   GFR 60.13 03/14/2021   Lab Results  Component Value Date   CHOL 190 03/14/2021   Lab Results  Component Value Date   HDL 42.40 03/14/2021   Lab Results  Component Value Date   LDLCALC 122 (H) 02/17/2020   Lab Results  Component Value Date   TRIG 242.0 (H)  03/14/2021   Lab Results  Component  Value Date   CHOLHDL 4 03/14/2021   No results found for: HGBA1C    Assessment & Plan:   Problem List Items Addressed This Visit       Other   Depression with anxiety - Primary   Relevant Medications   DULoxetine (CYMBALTA) 60 MG capsule   Snores    Meds ordered this encounter  Medications   DULoxetine (CYMBALTA) 60 MG capsule    Sig: Take 1 capsule (60 mg total) by mouth daily.    Dispense:  90 capsule    Refill:  4    Follow-up: Return Has scheduled appointment for physical in August..    Libby Maw, MD

## 2021-10-19 ENCOUNTER — Other Ambulatory Visit (HOSPITAL_COMMUNITY): Payer: Self-pay

## 2021-10-22 NOTE — Progress Notes (Signed)
° °  [facility]  HOME SLEEP TEST (Watch PAT) REPORT  STUDY DATE: 10/17/2021  DOB: Dec 10, 1960  MRN: [id number]  ORDERING CLINICIAN: Star Age, MD, PhD   REFERRING CLINICIAN: Debbora Presto, NP  CLINICAL INFORMATION/HISTORY: 61 year old right-handed gentleman with an underlying medical history of psoriatic arthritis, neuropathy, restless leg syndrome, hypertension, allergies, anxiety, and overweight state, who reports snoring and excessive daytime somnolence as well as witnessed apneas per wife's report.   Epworth sleepiness score: 20/24.  BMI: 27.8 kg/m  FINDINGS:   Sleep Summary:   Total Recording Time (hours, min): 8 hours, 36 minutes  Total Sleep Time (hours, min):  7 hours, 49 minutes   Percent REM (%):    15.3%   Respiratory Indices:   Calculated pAHI (per hour):  29.6/hour         REM pAHI:    18.4/hour       NREM pAHI: 31.6/hour  Oxygen Saturation Statistics:    Oxygen Saturation (%) Mean: 93%   Minimum oxygen saturation (%):                 84%   O2 Saturation Range (%): 84-98%    O2 Saturation (minutes) <=88%: 5 min  Pulse Rate Statistics:   Pulse Mean (bpm):    70/min    Pulse Range (54-100/min)   IMPRESSION: OSA (obstructive sleep apnea)   RECOMMENDATION:  This home sleep test demonstrates moderate obstructive sleep apnea with a total AHI of 29.6/hour and O2 nadir of 84%.  Mild to moderate snoring was detected.  Treatment with positive airway pressure is recommended. The patient will be advised to proceed with an autoPAP titration/trial at home for now. A full night titration study may be considered to optimize treatment settings, if needed down the road. Please note that untreated obstructive sleep apnea may carry additional perioperative morbidity. Patients with significant obstructive sleep apnea should receive perioperative PAP therapy and the surgeons and particularly the anesthesiologist should be informed of the diagnosis and  the severity of the sleep disordered breathing.  Alternative treatment options may include a dental device through dentistry or surgical option with a hypoglossal nerve stimulator through ENT. The patient should be cautioned not to drive, work at heights, or operate dangerous or heavy equipment when tired or sleepy. Review and reiteration of good sleep hygiene measures should be pursued with any patient. Other causes of the patient's symptoms, including circadian rhythm disturbances, an underlying mood disorder, medication effect and/or an underlying medical problem cannot be ruled out based on this test. Clinical correlation is recommended. The patient and his referring provider will be notified of the test results. The patient will be seen in follow up in sleep clinic at [facility].  I certify that I have reviewed the raw data recording prior to the issuance of this report in accordance with the standards of the American Academy of Sleep Medicine (AASM).   INTERPRETING PHYSICIAN:   Star Age, MD, PhD  Board Certified in Neurology and Sleep Medicine  [facility] [address] [place], [place] [address] [phone]

## 2021-10-22 NOTE — Addendum Note (Signed)
Addended by: Star Age on: 10/22/2021 06:36 PM   Modules accepted: Orders

## 2021-10-22 NOTE — Procedures (Signed)
° °  [facility]  HOME SLEEP TEST (Watch PAT) REPORT  STUDY DATE: 10/17/2021  DOB: 1961/04/08  MRN: [id number]  ORDERING CLINICIAN: Star Age, MD, PhD   REFERRING CLINICIAN: Debbora Presto, NP  CLINICAL INFORMATION/HISTORY: 61 year old right-handed gentleman with an underlying medical history of psoriatic arthritis, neuropathy, restless leg syndrome, hypertension, allergies, anxiety, and overweight state, who reports snoring and excessive daytime somnolence as well as witnessed apneas per wife's report.   Epworth sleepiness score: 20/24.  BMI: 27.8 kg/m  FINDINGS:   Sleep Summary:   Total Recording Time (hours, min): 8 hours, 36 minutes  Total Sleep Time (hours, min):  7 hours, 49 minutes   Percent REM (%):    15.3%   Respiratory Indices:   Calculated pAHI (per hour):  29.6/hour         REM pAHI:    18.4/hour       NREM pAHI: 31.6/hour  Oxygen Saturation Statistics:    Oxygen Saturation (%) Mean: 93%   Minimum oxygen saturation (%):                 84%   O2 Saturation Range (%): 84-98%    O2 Saturation (minutes) <=88%: 5 min  Pulse Rate Statistics:   Pulse Mean (bpm):    70/min    Pulse Range (54-100/min)   IMPRESSION: OSA (obstructive sleep apnea)   RECOMMENDATION:  This home sleep test demonstrates moderate obstructive sleep apnea with a total AHI of 29.6/hour and O2 nadir of 84%.  Mild to moderate snoring was detected.  Treatment with positive airway pressure is recommended. The patient will be advised to proceed with an autoPAP titration/trial at home for now. A full night titration study may be considered to optimize treatment settings, if needed down the road. Please note that untreated obstructive sleep apnea may carry additional perioperative morbidity. Patients with significant obstructive sleep apnea should receive perioperative PAP therapy and the surgeons and particularly the anesthesiologist should be informed of the diagnosis and  the severity of the sleep disordered breathing.  Alternative treatment options may include a dental device through dentistry or surgical option with a hypoglossal nerve stimulator through ENT. The patient should be cautioned not to drive, work at heights, or operate dangerous or heavy equipment when tired or sleepy. Review and reiteration of good sleep hygiene measures should be pursued with any patient. Other causes of the patient's symptoms, including circadian rhythm disturbances, an underlying mood disorder, medication effect and/or an underlying medical problem cannot be ruled out based on this test. Clinical correlation is recommended. The patient and his referring provider will be notified of the test results. The patient will be seen in follow up in sleep clinic at [facility].  I certify that I have reviewed the raw data recording prior to the issuance of this report in accordance with the standards of the American Academy of Sleep Medicine (AASM).   INTERPRETING PHYSICIAN:   Star Age, MD, PhD  Board Certified in Neurology and Sleep Medicine  [facility] [address], [place], [place] [address] [phone]

## 2021-10-23 ENCOUNTER — Telehealth: Payer: Self-pay

## 2021-10-23 NOTE — Telephone Encounter (Signed)
-----   Message from Star Age, MD sent at 10/22/2021  6:36 PM EST ----- Patient referred by Debbora Presto, NP, seen by me on 10/01/2021, patient had a HST on 10/17/2021.    Please call and notify the patient that the recent home sleep test showed obstructive sleep apnea in the moderate range. I recommend treatment in the form of autoPAP, which means, that we don't have to bring him in for a sleep study with CPAP, but will let him start using a so called autoPAP machine at home, which is a CPAP-like machine with self-adjusting pressures. We will send the order to a local DME company (of his choice, or as per insurance requirement). The DME representative will fit him with a mask, educate him on how to use the machine, how to put the mask on, etc. I have placed an order in the chart. Please send the order, talk to patient, send report to referring MD. We will need a FU in sleep clinic for 10 weeks post-PAP set up, please arrange that with me or one of our NPs. Also reinforce the need for compliance with treatment. Thanks,   Star Age, MD, PhD [facility])

## 2021-10-23 NOTE — Telephone Encounter (Signed)
I called pt. I advised pt that Dr. Rexene Alberts reviewed their sleep study results and found that pt has moderate osa. Dr. Rexene Alberts recommends that pt start an auto pap at home. I reviewed PAP compliance expectations with the pt. Pt is agreeable to starting an auto-PAP. I advised pt that an order will be sent to a DME, [facility], and [facility] will call the pt within about one week after they file with the pt's insurance. [facility] will show the pt how to use the machine, fit for masks, and troubleshoot the auto-PAP if needed. A follow up appt was made for insurance purposes with Amy, NP on 01/24/2032 at 9:00am. Pt verbalized understanding to arrive 15 minutes early and bring their auto-PAP. A letter with all of this information in it will be mailed to the pt as a reminder. I verified with the pt that the address we have on file is correct. Pt verbalized understanding of results. Pt had no questions at this time but was encouraged to call back if questions arise. I have sent the order to [facility] and have received confirmation that they have received the order.

## 2021-10-24 ENCOUNTER — Other Ambulatory Visit (HOSPITAL_COMMUNITY): Payer: Self-pay

## 2021-10-25 ENCOUNTER — Other Ambulatory Visit (HOSPITAL_COMMUNITY): Payer: Self-pay

## 2021-10-26 ENCOUNTER — Other Ambulatory Visit (HOSPITAL_COMMUNITY): Payer: Self-pay

## 2021-10-27 ENCOUNTER — Other Ambulatory Visit (HOSPITAL_COMMUNITY): Payer: Self-pay

## 2021-11-02 ENCOUNTER — Other Ambulatory Visit (HOSPITAL_COMMUNITY): Payer: Self-pay

## 2021-11-03 ENCOUNTER — Encounter: Payer: Self-pay | Admitting: Neurology

## 2021-11-03 ENCOUNTER — Other Ambulatory Visit (HOSPITAL_COMMUNITY): Payer: Self-pay

## 2021-11-05 ENCOUNTER — Telehealth: Payer: Self-pay | Admitting: Pharmacist

## 2021-11-05 ENCOUNTER — Other Ambulatory Visit (HOSPITAL_COMMUNITY): Payer: Self-pay

## 2021-11-05 ENCOUNTER — Ambulatory Visit: Payer: BC Managed Care – PPO | Attending: Family Medicine | Admitting: Pharmacist

## 2021-11-05 ENCOUNTER — Other Ambulatory Visit: Payer: Self-pay

## 2021-11-05 DIAGNOSIS — Z7189 Other specified counseling: Secondary | ICD-10-CM

## 2021-11-05 MED ORDER — COSENTYX SENSOREADY (300 MG) 150 MG/ML ~~LOC~~ SOAJ
SUBCUTANEOUS | 3 refills | Status: DC
  Filled 2021-11-05: qty 2, 28d supply, fill #0
  Filled 2021-12-10: qty 2, 28d supply, fill #1
  Filled 2022-01-10: qty 2, 28d supply, fill #2
  Filled 2022-02-07: qty 2, 28d supply, fill #3

## 2021-11-05 MED ORDER — COSENTYX SENSOREADY (300 MG) 150 MG/ML ~~LOC~~ SOAJ
SUBCUTANEOUS | 3 refills | Status: DC
  Filled 2021-11-05: qty 2, 28d supply, fill #0

## 2021-11-05 NOTE — Progress Notes (Signed)
? ?  S: ?Patient presents today for review of their specialty medication.  ? ?Patient is currently taking Cosentyx (secukinumab) for seronegative spondylitis. Patient is managed by Efraim Kaufmann for this.  ? ?Dosing: changing to 150 mg subcutaneously q2weeks  ? ?Adherence: reported  ? ?Efficacy: pt reports that this is controlling symptoms well ? ?Current adverse effects: ?S/sx of infection: none ?GI upset: none ?Headache: none ?S/sx of hypersensitivity: none ? ?O: ?Lab Results  ?Component Value Date  ? WBC 5.8 03/14/2021  ? HGB 15.5 03/14/2021  ? HCT 44.9 03/14/2021  ? MCV 95.9 03/14/2021  ? PLT 199.0 03/14/2021  ? ? ?  Chemistry   ?   ?Component Value Date/Time  ? NA 139 03/14/2021 0841  ? K 4.5 03/14/2021 0841  ? CL 105 03/14/2021 0841  ? CO2 27 03/14/2021 0841  ? BUN 16 03/14/2021 0841  ? CREATININE 1.28 03/14/2021 0841  ? CREATININE 1.21 10/22/2019 1411  ?    ?Component Value Date/Time  ? CALCIUM 9.6 03/14/2021 0841  ? ALKPHOS 53 03/14/2021 0841  ? AST 26 03/14/2021 0841  ? ALT 47 03/14/2021 0841  ? BILITOT 1.3 (H) 03/14/2021 0841  ?  ? ? ? ?A/P: ?1. Medication review: Patient is currently on Cosentyx for seronegative spondylitis and is tolerating it well with q2wk dosing. Reviewed the medication with the patient, including the following: Cosentyx is a monoclonal antibody used in the treatment of ankylosing spondylitis, psoriasis, and psoriatic arthritis. The injection is subq and the medication should be allowed to reach room temp prior to injecting. Injection sites should be rotated. Possible adverse effects include headaches, GI upset, increased risk of infection and hypersensitivity reactions. No recommendations for any changes at this time. ? ?Benard Halsted, PharmD, BCACP, CPP ?Clinical Pharmacist ?[facility] ?[phone] ? ?

## 2021-11-05 NOTE — Telephone Encounter (Signed)
Called patient to schedule an appointment for the [employer] [facility]. I was unable to reach the patient so I left a HIPAA-compliant message requesting that the patient return my call.   Luke Van Ausdall, PharmD, BCACP, CPP Clinical Pharmacist [facility] [phone]  

## 2021-11-23 ENCOUNTER — Other Ambulatory Visit (HOSPITAL_COMMUNITY): Payer: Self-pay

## 2021-12-03 ENCOUNTER — Encounter: Payer: Self-pay | Admitting: Family Medicine

## 2021-12-04 ENCOUNTER — Encounter: Payer: Self-pay | Admitting: Family Medicine

## 2021-12-04 ENCOUNTER — Ambulatory Visit: Payer: BC Managed Care – PPO | Admitting: Family Medicine

## 2021-12-04 VITALS — BP 138/72 | HR 77 | Temp 98.0°F | Ht 70.0 in | Wt 193.8 lb

## 2021-12-04 DIAGNOSIS — M7711 Lateral epicondylitis, right elbow: Secondary | ICD-10-CM

## 2021-12-04 HISTORY — DX: Lateral epicondylitis, right elbow: M77.11

## 2021-12-04 MED ORDER — DICLOFENAC SODIUM 1 % EX GEL: CUTANEOUS | 1 refills | Status: DC

## 2021-12-04 MED ORDER — TENNIS ELBOW STRAP MISC: 2 refills | Status: DC

## 2021-12-04 NOTE — Progress Notes (Signed)
? ?Established Patient Office Visit ? ?Subjective:  ?Patient ID: Benjamin Fields, male    DOB: 10-25-1960  Age: 61 y.o. MRN: [id number] ? ?CC:  ?Chief Complaint  ?Patient presents with  ? Elbow Injury  ?  Pain in right elbow not sure if this is bursitis symptoms x 1 week becoming worse.   ? ? ?HPI ?Benjamin Fields presents for evaluation and treatment of a 1 week history of right elbow pain.  Cannot think of no specific injury.  Left hand dominant.  Recently retired and working more around the yard.  No prior injury to this elbow. ? ?Past Medical History:  ?Diagnosis Date  ? Allergy   ? mild- no meds   ? Anxiety   ? Hypertension   ? Neuromuscular disorder ([place])   ? mild nerve issue right leg   ? RA (rheumatoid arthritis) ([place])   ? ? ?Past Surgical History:  ?Procedure Laterality Date  ? CERVICAL SPINE SURGERY  2010  ? COLONOSCOPY    ? COLONOSCOPY  04/2020  ? TONSILLECTOMY    ? ? ?Family History  ?Problem Relation Age of Onset  ? Parkinson's disease Mother   ? Atrial fibrillation Mother   ? Heart failure Father   ? Hypertension Father   ? Prostate cancer Father   ? Colon cancer Neg Hx   ? Colon polyps Neg Hx   ? Esophageal cancer Neg Hx   ? Rectal cancer Neg Hx   ? Stomach cancer Neg Hx   ? ? ?Social History  ? ?Socioeconomic History  ? Marital status: Married  ?  Spouse name: Not on file  ? Number of children: Not on file  ? Years of education: Not on file  ? Highest education level: Not on file  ?Occupational History  ? Not on file  ?Tobacco Use  ? Smoking status: Never  ? Smokeless tobacco: Never  ?Vaping Use  ? Vaping Use: Never used  ?Substance and Sexual Activity  ? Alcohol use: Never  ? Drug use: Never  ? Sexual activity: Yes  ?Other Topics Concern  ? Not on file  ?Social History Narrative  ? Lives home with wife.  workd at [employer]  Education: Masters.  Children 2.  Caffeine 2 cups daily  ? ?Social Determinants of Health  ? ?Financial Resource Strain: Not on file  ?Food Insecurity: Not on file   ?Transportation Needs: Not on file  ?Physical Activity: Not on file  ?Stress: Not on file  ?Social Connections: Not on file  ?Intimate Partner Violence: Not on file  ? ? ?Outpatient Medications Prior to Visit  ?Medication Sig Dispense Refill  ? COSENTYX SENSOREADY, 300 MG, 150 MG/ML SOAJ Inject 150 mg subcutaneously every 14 (fourteen) days 6 mL 3  ? DULoxetine (CYMBALTA) 60 MG capsule Take 1 capsule (60 mg total) by mouth daily. 90 capsule 4  ? folic acid (FOLVITE) 1 MG tablet Take 1 mg by mouth daily.    ? gabapentin (NEURONTIN) 100 MG capsule Take 1 capsule in am, 1 capsule at lunch and 3 capsules at bedtime. 450 capsule 3  ? losartan (COZAAR) 50 MG tablet Take 1 tablet by mouth once daily 90 tablet 0  ? methotrexate 50 MG/2ML injection Inject into the skin.    ? Multiple Vitamin (MULTIVITAMIN) tablet Take 1 tablet by mouth daily. OTC    ? Syringe/Needle, Disp, (B-D ECLIPSE SYRINGE) 27G X 1/2" 1 ML MISC Use 1 Syringe every 7 (seven) days For Methotrexate Injections    ? ?  No facility-administered medications prior to visit.  ? ? ?Allergies  ?Allergen Reactions  ? Cortisone   ?  FLARE  ? ? ?ROS ?Review of Systems  ?Constitutional: Negative.   ?Musculoskeletal:  Positive for arthralgias.  ?Neurological:  Negative for weakness and numbness.  ? ?  ?Objective:  ?  ?Physical Exam ?Vitals and nursing note reviewed.  ?Constitutional:   ?   Appearance: Normal appearance.  ?HENT:  ?   Head: Normocephalic and atraumatic.  ?Eyes:  ?   General: No scleral icterus.    ?   Right eye: No discharge.     ?   Left eye: No discharge.  ?   Extraocular Movements: Extraocular movements intact.  ?   Conjunctiva/sclera: Conjunctivae normal.  ?Musculoskeletal:  ?   Right elbow: No deformity or effusion. Normal range of motion. Tenderness present in lateral epicondyle.  ?     Arms: ? ?Neurological:  ?   Mental Status: He is alert and oriented to person, place, and time.  ?Psychiatric:     ?   Mood and Affect: Mood normal.     ?   Behavior:  Behavior normal.  ? ? ?BP 138/72 (BP Location: Left Arm, Patient Position: Sitting, Cuff Size: Normal)   Pulse 77   Temp 98 ?F (36.7 ?C) (Temporal)   Ht '5\' 10"'$  (1.778 m)   Wt 193 lb 12.8 oz (87.9 kg)   SpO2 95%   BMI 27.81 kg/m?  ?Wt Readings from Last 3 Encounters:  ?12/04/21 193 lb 12.8 oz (87.9 kg)  ?10/18/21 192 lb (87.1 kg)  ?10/01/21 193 lb 3.2 oz (87.6 kg)  ? ? ? ?There are no preventive care reminders to display for this patient. ? ?There are no preventive care reminders to display for this patient. ? ?Lab Results  ?Component Value Date  ? TSH 1.99 03/14/2021  ? ?Lab Results  ?Component Value Date  ? WBC 5.8 03/14/2021  ? HGB 15.5 03/14/2021  ? HCT 44.9 03/14/2021  ? MCV 95.9 03/14/2021  ? PLT 199.0 03/14/2021  ? ?Lab Results  ?Component Value Date  ? NA 139 03/14/2021  ? K 4.5 03/14/2021  ? CO2 27 03/14/2021  ? GLUCOSE 102 (H) 03/14/2021  ? BUN 16 03/14/2021  ? CREATININE 1.28 03/14/2021  ? BILITOT 1.3 (H) 03/14/2021  ? ALKPHOS 53 03/14/2021  ? AST 26 03/14/2021  ? ALT 47 03/14/2021  ? PROT 6.0 03/14/2021  ? ALBUMIN 4.3 03/14/2021  ? CALCIUM 9.6 03/14/2021  ? GFR 60.13 03/14/2021  ? ?Lab Results  ?Component Value Date  ? CHOL 190 03/14/2021  ? ?Lab Results  ?Component Value Date  ? HDL 42.40 03/14/2021  ? ?Lab Results  ?Component Value Date  ? LDLCALC 122 (H) 02/17/2020  ? ?Lab Results  ?Component Value Date  ? TRIG 242.0 (H) 03/14/2021  ? ?Lab Results  ?Component Value Date  ? CHOLHDL 4 03/14/2021  ? ?No results found for: HGBA1C ? ?  ?Assessment & Plan:  ? ?Problem List Items Addressed This Visit   ? ?  ? Musculoskeletal and Integument  ? Lateral epicondylitis of right elbow - Primary  ? Relevant Medications  ? Elastic Bandages & Supports (TENNIS ELBOW STRAP) MISC  ? diclofenac Sodium (VOLTAREN) 1 % GEL  ? ? ?Meds ordered this encounter  ?Medications  ? Elastic Bandages & Supports (TENNIS ELBOW STRAP) MISC  ?  Sig: As directed  ?  Dispense:  1 each  ?  Refill:  2  ?  diclofenac Sodium (VOLTAREN) 1 %  GEL  ?  Sig: Apply a small grape sized dollop to lateral epicondyles 3 times daily.  ?  Dispense:  150 g  ?  Refill:  1  ? ? ?Follow-up: Return if symptoms worsen or fail to improve, for Sports medicine referral if not improving.Marland Kitchen  ?Demonstrated application of tennis elbow strap.  Voltaren gel applied 3 times daily.  Concentrate on lifting with palms upward.  Information was given on lateral epicondylitis and exercises.  Sports medicine referral as needed. ? ?Libby Maw, MD ?

## 2021-12-06 ENCOUNTER — Other Ambulatory Visit (HOSPITAL_COMMUNITY): Payer: Self-pay

## 2021-12-10 ENCOUNTER — Other Ambulatory Visit (HOSPITAL_COMMUNITY): Payer: Self-pay

## 2021-12-21 ENCOUNTER — Other Ambulatory Visit (HOSPITAL_COMMUNITY): Payer: Self-pay

## 2021-12-26 DIAGNOSIS — M7711 Lateral epicondylitis, right elbow: Secondary | ICD-10-CM | POA: Diagnosis not present

## 2021-12-26 DIAGNOSIS — M4690 Unspecified inflammatory spondylopathy, site unspecified: Secondary | ICD-10-CM | POA: Diagnosis not present

## 2021-12-26 DIAGNOSIS — M25521 Pain in right elbow: Secondary | ICD-10-CM | POA: Diagnosis not present

## 2021-12-26 DIAGNOSIS — Z9289 Personal history of other medical treatment: Secondary | ICD-10-CM | POA: Diagnosis not present

## 2021-12-31 ENCOUNTER — Other Ambulatory Visit: Payer: Self-pay | Admitting: Family Medicine

## 2021-12-31 DIAGNOSIS — I10 Essential (primary) hypertension: Secondary | ICD-10-CM

## 2022-01-10 ENCOUNTER — Other Ambulatory Visit (HOSPITAL_COMMUNITY): Payer: Self-pay

## 2022-01-18 ENCOUNTER — Other Ambulatory Visit (HOSPITAL_COMMUNITY): Payer: Self-pay

## 2022-01-23 ENCOUNTER — Encounter: Payer: BC Managed Care – PPO | Admitting: Family Medicine

## 2022-02-05 ENCOUNTER — Telehealth: Payer: Self-pay | Admitting: Family Medicine

## 2022-02-05 NOTE — Telephone Encounter (Signed)
Benjamin Fields [phone] ext [phone] please give her a call about she want to know if you received a certain fax

## 2022-02-07 ENCOUNTER — Other Ambulatory Visit (HOSPITAL_COMMUNITY): Payer: Self-pay

## 2022-02-07 NOTE — Telephone Encounter (Signed)
Benjamin Fields aware that forms were received and will be faxed as soon as they are viewed and signed.

## 2022-02-14 ENCOUNTER — Telehealth: Payer: Self-pay | Admitting: Family Medicine

## 2022-02-14 ENCOUNTER — Other Ambulatory Visit: Payer: Self-pay

## 2022-02-14 ENCOUNTER — Telehealth: Payer: Self-pay | Admitting: Neurology

## 2022-02-14 ENCOUNTER — Other Ambulatory Visit (HOSPITAL_COMMUNITY): Payer: Self-pay

## 2022-02-14 NOTE — Telephone Encounter (Signed)
Duplicate message. 

## 2022-02-14 NOTE — Telephone Encounter (Signed)
Pt called to check status of paperwork for new insurance company, [facility]. I also requested him to upload new insurance card via [place].  Please call [phone]

## 2022-02-14 NOTE — Telephone Encounter (Signed)
LVM at 11:16 am. Had switched insurance company, they had a form that needed to be signed. Insurance have not received the form. I probably have been out of Gabapentin prescribe for me. Please let me know if I can do something to expedite getting form back to the insurance company. Please let me know I can help in anyway. Would like a call from the nurse

## 2022-02-14 NOTE — Telephone Encounter (Signed)
Pt wife just called about paperwork and would like a call ( judy ) [phone]. Drue Dun stated she just talked to mr Olinde less than 10 minutes ago

## 2022-02-14 NOTE — Telephone Encounter (Signed)
I called [facility] and they do have refills on gabapentin for him.  He had insurance change?  They had BCBS as we do.  I LMVM for him was calling to check about this., but will not be able to address now, but he has refills at [facility].  He can call back monday.

## 2022-02-14 NOTE — Telephone Encounter (Signed)
Returned call no answer LMTCB 

## 2022-02-18 ENCOUNTER — Encounter: Payer: Self-pay | Admitting: Family Medicine

## 2022-02-19 ENCOUNTER — Other Ambulatory Visit: Payer: Self-pay | Admitting: Neurology

## 2022-02-19 MED ORDER — GABAPENTIN 100 MG PO CAPS: ORAL_CAPSULE | ORAL | 1 refills | Status: DC

## 2022-02-20 NOTE — Telephone Encounter (Signed)
All forms completed and faxed. Called patient to inform no answer. LMTCB

## 2022-02-21 NOTE — Telephone Encounter (Signed)
Took call from phone staff. Spoke w/ pt. He states insurance requesting we call them to give prescription in order to send prescription to mail order pharmacy. Advised we typically send prescription electronically directly to mail order pharmacy. Prescription has to come from MD, not insurance. I asked name of mail order pharmacy x2 and pt does not know. He expressed dissatisfaction and states PCP was able to take care of this in this manner. Apologized for inconvenience and again advised how we could take care of this but pt ended call.

## 2022-02-27 ENCOUNTER — Telehealth: Payer: Self-pay | Admitting: Neurology

## 2022-02-27 MED ORDER — GABAPENTIN 100 MG PO CAPS: ORAL_CAPSULE | ORAL | 1 refills | Status: DC

## 2022-02-27 NOTE — Telephone Encounter (Signed)
I called and spoke to [facility],  she said the pharmacy would go to [place], [place].

## 2022-02-27 NOTE — Telephone Encounter (Signed)
[facility]) request refill for gabapentin (NEURONTIN) 100 MG capsule at [place]

## 2022-03-06 ENCOUNTER — Other Ambulatory Visit (HOSPITAL_COMMUNITY): Payer: Self-pay

## 2022-03-08 ENCOUNTER — Other Ambulatory Visit (HOSPITAL_COMMUNITY): Payer: Self-pay

## 2022-03-11 ENCOUNTER — Other Ambulatory Visit (HOSPITAL_COMMUNITY): Payer: Self-pay

## 2022-03-18 ENCOUNTER — Other Ambulatory Visit (HOSPITAL_COMMUNITY): Payer: Self-pay

## 2022-04-08 NOTE — Telephone Encounter (Signed)
err

## 2022-06-07 ENCOUNTER — Other Ambulatory Visit (HOSPITAL_COMMUNITY): Payer: Self-pay

## 2022-06-25 DIAGNOSIS — M542 Cervicalgia: Secondary | ICD-10-CM | POA: Diagnosis not present

## 2022-07-04 DIAGNOSIS — M542 Cervicalgia: Secondary | ICD-10-CM | POA: Diagnosis not present

## 2022-07-11 DIAGNOSIS — M542 Cervicalgia: Secondary | ICD-10-CM | POA: Diagnosis not present

## 2022-07-26 ENCOUNTER — Telehealth: Payer: Self-pay | Admitting: Radiology

## 2022-07-26 NOTE — Telephone Encounter (Signed)
I called patient to schedule appointment for his cervical spine per Dr. Junius Roads. Please make patient appointment with Dr. Lorin Mercy if he returns call.

## 2022-07-30 ENCOUNTER — Ambulatory Visit: Payer: No Typology Code available for payment source | Admitting: Orthopaedic Surgery

## 2022-07-30 ENCOUNTER — Encounter: Payer: Self-pay | Admitting: Orthopaedic Surgery

## 2022-07-30 ENCOUNTER — Ambulatory Visit (INDEPENDENT_AMBULATORY_CARE_PROVIDER_SITE_OTHER): Payer: No Typology Code available for payment source

## 2022-07-30 VITALS — BP 162/83 | HR 76 | Ht 70.0 in | Wt 188.0 lb

## 2022-07-30 DIAGNOSIS — M5020 Other cervical disc displacement, unspecified cervical region: Secondary | ICD-10-CM | POA: Diagnosis not present

## 2022-07-30 DIAGNOSIS — Z981 Arthrodesis status: Secondary | ICD-10-CM

## 2022-07-30 DIAGNOSIS — M542 Cervicalgia: Secondary | ICD-10-CM | POA: Diagnosis not present

## 2022-07-30 DIAGNOSIS — M4802 Spinal stenosis, cervical region: Secondary | ICD-10-CM | POA: Diagnosis not present

## 2022-07-30 HISTORY — DX: Spinal stenosis, cervical region: M48.02

## 2022-07-30 HISTORY — DX: Other cervical disc displacement, unspecified cervical region: M50.20

## 2022-07-30 NOTE — Progress Notes (Signed)
Office Visit Note   Patient: Benjamin Fields           Date of Birth: 13-Jun-1961           MRN: [id number] Visit Date: 07/30/2022              Requested by: Libby Maw, MD [address] [place],  [place] [address] PCP: Libby Maw, MD   Assessment & Plan: Visit Diagnoses:  1. Neck pain   2. History of fusion of cervical spine   3. Spinal stenosis of cervical region   4. Cervical disc herniation     Plan: Patient states his office is changing insurance and he would like to wait till this starts at the beginning of the year and delay surgery till after the holidays.  If he develops any progressive weakness or gait disturbance or lower extremity weakness he will contact us promptly otherwise recheck 6 weeks.  We discussed options with plate removal and disc arthroplasty at C6-7.  He has some mild to moderate narrowing at C3-4 above his fusion from 2007.  We discussed surgical recommendation would be disc arthroplasty rather than fusing C6-7.  Procedure discussed recheck 6 weeks.  MRI scan was reviewed we discussed surgical options, reviewed his MRI again with copy of the report.  We looked at old x-rays from 2007 and recent radiographs obtained today.  Follow-Up Instructions: Return in about 6 weeks (around 09/10/2022).   Orders:  Orders Placed This Encounter  Procedures   XR Cervical Spine 2 or 3 views   No orders of the defined types were placed in this encounter.     Procedures: No procedures performed   Clinical Data: No additional findings.   Subjective: Chief Complaint  Patient presents with   Neck - Pain    HPI 61 year old male had previous cervical fusion by me in 2007 which was 16 years ago.  He did well until more than 2 months ago he started having progressive left side neck pain into his shoulder weakness in his left arm and numbness that radiates down to his long finger.  He has noticed some weakness with pushing and persistent  numbness.  He has sharp pain with rotation of his neck or with extension or flexion.  Previous fusion C4-5 C5-6.  Patient had an MRI scan ordered by Dr. Junius Roads due to his persistent upper extremity weakness numbness and pain of the cervical spine which shows adjacent disc herniation and protrusion at C6-7 on the right narrowing the cord down to 4 mm consistent with severe cervical cervical stenosis without cord abnormality  He has not noticed any gait problems.  He did not have any cord abnormal signal.  Review of Systems patient's been on methotrexate in the past for rheumatoid arthritis given anti-inflammatories.  Recently taking tramadol for the neck left arm radicular pain.  No fever chills no associated bowel or bladder symptoms all other systems noncontributory to HPI.   Objective: Vital Signs: BP (!) 162/83   Pulse 76   Ht '5\' 10"'$  (1.778 m)   Wt 188 lb (85.3 kg)   BMI 26.98 kg/m   Physical Exam Constitutional:      Appearance: He is well-developed.  HENT:     Head: Normocephalic and atraumatic.     Right Ear: External ear normal.     Left Ear: External ear normal.  Eyes:     Pupils: Pupils are equal, round, and reactive to light.  Neck:  Thyroid: No thyromegaly.     Trachea: No tracheal deviation.  Cardiovascular:     Rate and Rhythm: Normal rate.  Pulmonary:     Effort: Pulmonary effort is normal.     Breath sounds: No wheezing.  Abdominal:     General: Bowel sounds are normal.     Palpations: Abdomen is soft.  Musculoskeletal:     Cervical back: Neck supple.  Skin:    General: Skin is warm and dry.     Capillary Refill: Capillary refill takes less than 2 seconds.  Neurological:     Mental Status: He is alert and oriented to person, place, and time.  Psychiatric:        Behavior: Behavior normal.        Thought Content: Thought content normal.        Judgment: Judgment normal.     Ortho Exam patient has left triceps atrophy left triceps weakness 4 out of 5.   Wrist flexion is weak 4 out of 5 decreased sensation left long finger.  Carpal tunnel exam is negative.  Ulnar nerve at the elbow is tender but no subluxation.  Opposite right triceps is strong no brachial plexus tenderness on the right positive Spurling on the left negative on the right.  No lower extremity clonus normal heel-toe gait.  Specialty Comments:  No specialty comments available.  Imaging: CLINICAL DATA: Posterior neck and left arm pain and shocking sensations for 6 weeks. No known injury. Remote cervical fusion.  EXAM: MRI CERVICAL SPINE WITHOUT CONTRAST  TECHNIQUE: Multiplanar, multisequence MR imaging of the cervical spine was performed. No intravenous contrast was administered.  COMPARISON: Radiographs 02/18/2006. Cervical MRI 03/11/2019 is partially corrupted and partially correlated.  FINDINGS: Alignment: Minimal degenerative anterolisthesis at C3-4 has mildly progressed. Otherwise normal.  Vertebrae: Status post C4-6 ACDF with solid interbody fusion at C4-5. Solid interbody fusion at C5-6 is not definitely demonstrated, although there is no endplate edema to suggest abnormal motion. Appearance is similar to previous MRI. No evidence of acute fracture or focal marrow lesion.  Cord: Mild cord flattening at C3-4. Moderate cord flattening at C6-7. No abnormal cord signal.  Posterior Fossa, vertebral arteries, paraspinal tissues: Visualized portions of the posterior fossa appear unremarkable.Bilateral vertebral artery flow voids. No significant paraspinal findings.  Disc levels:  C2-3: Normal interspace.  C3-4: Progressive spondylosis with disc bulging, uncinate spurring and asymmetric right-sided facet hypertrophy. The CSF surrounding the cord is partially effaced with narrowing of the AP diameter of the canal to 8 mm. Moderate foraminal narrowing bilaterally appears similar to previous study.  C4-5: Stable appearance post ACDF. The spinal canal and  neural foramina are patent.  C5-6: Stable appearance status post ACDF. Unchanged moderate right foraminal narrowing. No cord deformity. The left foramen is patent.  C6-7: Adjacent segment disease with progressive loss of disc height and interval development of a moderate-sized left paracentral and medial foraminal disc extrusion. Sagittal images demonstrate mild cephalad extension of disc material behind the C6 vertebral body. There is cord flattening with narrowing of the AP diameter of the canal to 4 mm. Moderate to severe left foraminal narrowing and probable left C7 nerve root encroachment. The right foramen is mildly narrowed.  C7-T1: No significant findings.  IMPRESSION: 1. Compared with previous MRI from 2020, there is progressive adjacent segment disease at C6-7 with a moderate-sized left paracentral and medial foraminal disc extrusion causing cord flattening and moderate to severe left foraminal narrowing. Probable left C7 nerve root encroachment. Recommend spinal surgical  evaluation. 2. Stable postsurgical changes status post C4-6 ACDF. 3. Progressive spondylosis at C3-4 with resulting mild spinal stenosis and moderate foraminal narrowing bilaterally. 4. No acute osseous findings or abnormal cord signal. 5. These results will be called to the ordering clinician or representative by the Radiologist Assistant, and communication documented in the PACS or [employer].   Electronically Signed By: Richardean Sale M.D. On: 07/23/2022 09:57   PMFS History: Patient Active Problem List   Diagnosis Date Noted   History of fusion of cervical spine 07/30/2022   Spinal stenosis of cervical region 07/30/2022   Cervical disc herniation 07/30/2022   Lateral epicondylitis of right elbow 12/04/2021   Snores 10/18/2021   Elevated triglycerides with high cholesterol 04/12/2021   Abdominal bloating 03/14/2021   Depression with anxiety 12/22/2020   Low back pain 02/17/2020    Left hip pain 02/17/2020   Healthcare maintenance 11/16/2019   Rheumatoid arthritis of multiple sites with negative rheumatoid factor ([place]) 05/19/2018   Tinea versicolor 05/19/2018   Essential hypertension 03/17/2018   DDD (degenerative disc disease), cervical 03/16/2018   Past Medical History:  Diagnosis Date   Allergy    mild- no meds    Anxiety    Hypertension    Neuromuscular disorder ([place])    mild nerve issue right leg    RA (rheumatoid arthritis) ([place])     Family History  Problem Relation Age of Onset   Parkinson's disease Mother    Atrial fibrillation Mother    Heart failure Father    Hypertension Father    Prostate cancer Father    Colon cancer Neg Hx    Colon polyps Neg Hx    Esophageal cancer Neg Hx    Rectal cancer Neg Hx    Stomach cancer Neg Hx     Past Surgical History:  Procedure Laterality Date   CERVICAL SPINE SURGERY  2010   COLONOSCOPY     COLONOSCOPY  04/2020   TONSILLECTOMY     Social History   Occupational History   Not on file  Tobacco Use   Smoking status: Never   Smokeless tobacco: Never  Vaping Use   Vaping Use: Never used  Substance and Sexual Activity   Alcohol use: Never   Drug use: Never   Sexual activity: Yes

## 2022-07-31 ENCOUNTER — Telehealth: Payer: Self-pay

## 2022-07-31 ENCOUNTER — Other Ambulatory Visit: Payer: Self-pay

## 2022-07-31 ENCOUNTER — Ambulatory Visit
Admission: RE | Admit: 2022-07-31 | Discharge: 2022-07-31 | Disposition: A | Discharge status: Home / Self Care | Payer: Self-pay | Source: Ambulatory Visit | Attending: Neurosurgery | Admitting: Neurosurgery

## 2022-07-31 DIAGNOSIS — Z049 Encounter for examination and observation for unspecified reason: Secondary | ICD-10-CM

## 2022-07-31 NOTE — Telephone Encounter (Signed)
Wife confirmed appt for 12/12 at 8:30am. Be here at 8am with CD and PT notes.

## 2022-07-31 NOTE — Telephone Encounter (Signed)
-----   Message from Peggyann Shoals sent at 07/31/2022 10:07 AM EST ----- Regarding: 2nd opinion Contact: [phone] Patient's wife calling and asking for 2nd opinion with Dr.Yarbrough. MRI was done at [facility]. Yes to physical therapy in the past 6 months, notes not in EPIC. No to injections in his neck. He was told that the injections would not help. Dr.Yates at [facility], notes in [facility],  wants to do surgery again on C4-5. He had previous surgery 15 years ago.  Olean Ree [phone]  [name] [phone]

## 2022-07-31 NOTE — Telephone Encounter (Signed)
Notes from Dr Lorin Mercy mention that he has weakness. MRI completed 07/19/22 at [facility]. Report scanned in chart.   Please schedule a new patient appt with Dr Izora Ribas ASAP. Have them bring the CD with the MRI, as I am having difficulty getting the images to transfer from outside images into [facility]. I will download the CD when they come in.

## 2022-08-05 NOTE — Progress Notes (Unsigned)
Referring Physician:  No referring provider defined for this encounter.  Primary Physician:  Benjamin Maw, MD  History of Present Illness: 08/06/2022 [name] is here today with a chief complaint of left sided neck pain that radiates into the shoulder along with weakness and numbness that radiates down into his fingers.  He has been having worsening symptoms over the past 2 months.  He reports sharp pain into his left shoulder blade and arm.  He has numbness and weakness in his left arm.  His pain is as bad as 10 out of 10 and made worse by flexion and extension of his neck or rotation of his neck.  Raising his arm helps a little bit.  He has had some trouble with buttoning buttons and opening jars.  He has had a fall, and feels slightly off balance.   Bowel/Bladder Dysfunction: none  Conservative measures:  Physical therapy: has participated in dry needling for the neck (3-4 sessions) at [facility] Multimodal medical therapy including regular antiinflammatories:  tramadol, cymbalta, gabapentin, aleve Injections: has not received any epidural steroid injections  Past Surgery:  C4-6 Fusion about 15 years ago By Dr. Trixie Fields has symptoms of cervical myelopathy.  The symptoms are causing a significant impact on the patient's life.   I have utilized the care everywhere function in epic to review the outside records available from external health systems.  Review of Systems:  A 10 point review of systems is negative, except for the pertinent positives and negatives detailed in the HPI.  Past Medical History: Past Medical History:  Diagnosis Date   Allergy    mild- no meds    Anxiety    Hypertension    Neuromuscular disorder ([place])    mild nerve issue right leg    RA (rheumatoid arthritis) ([place])     Past Surgical History: Past Surgical History:  Procedure Laterality Date   CERVICAL SPINE SURGERY  2010   COLONOSCOPY      COLONOSCOPY  04/2020   TONSILLECTOMY      Allergies: Allergies as of 08/06/2022 - Review Complete 08/06/2022  Allergen Reaction Noted   Cortisone  03/26/2018    Medications: Current Meds  Medication Sig   diclofenac Sodium (VOLTAREN) 1 % GEL Apply a small grape sized dollop to lateral epicondyles 3 times daily.   DULoxetine (CYMBALTA) 60 MG capsule Take 1 capsule (60 mg total) by mouth daily.   Elastic Bandages & Supports (TENNIS ELBOW STRAP) MISC As directed   gabapentin (NEURONTIN) 100 MG capsule Take 1 capsule in am, 1 capsule at lunch and 3 capsules at bedtime.   losartan (COZAAR) 50 MG tablet Take 1 tablet by mouth once daily   Multiple Vitamin (MULTIVITAMIN) tablet Take 1 tablet by mouth daily. OTC    Social History: Social History   Tobacco Use   Smoking status: Never   Smokeless tobacco: Never  Vaping Use   Vaping Use: Never used  Substance Use Topics   Alcohol use: Never   Drug use: Never    Family Medical History: Family History  Problem Relation Age of Onset   Parkinson's disease Mother    Atrial fibrillation Mother    Heart failure Father    Hypertension Father    Prostate cancer Father    Colon cancer Neg Hx    Colon polyps Neg Hx    Esophageal cancer Neg Hx    Rectal cancer Neg Hx    Stomach  cancer Neg Hx     Physical Examination: Vitals:   08/06/22 0840  BP: (!) 144/84    General: Patient is well developed, well nourished, calm, collected, and in no apparent distress. Attention to examination is appropriate.  Neck:   Supple.  Full range of motion with discomfort.  Respiratory: Patient is breathing without any difficulty.   NEUROLOGICAL:     Awake, alert, oriented to person, place, and time.  Speech is clear and fluent. Fund of knowledge is appropriate.   Cranial Nerves: Pupils equal round and reactive to light.  Facial tone is symmetric.  Facial sensation is symmetric. Shoulder shrug is symmetric. Tongue protrusion is midline.  There  is no pronator drift.  ROM of spine: full.    Strength: Side Biceps Triceps Deltoid Interossei Grip Wrist Ext. Wrist Flex.  R '5 5 5 5 5 5 5  '$ L 5 4- '5 5 5 5 5   '$ Side Iliopsoas Quads Hamstring PF DF EHL  R '5 5 5 5 5 5  '$ L '5 5 5 5 5 5   '$ Reflexes are 2+ and symmetric at the biceps, triceps, brachioradialis, patella and achilles.   Hoffman's is present.   Bilateral upper and lower extremity sensation is intact to light touch.    No evidence of dysmetria noted.  Gait is wide-based.  He has moderate difficulty with tandem walk..     Medical Decision Making  Imaging: MRI cervical spine on July 19, 2022 shows postsurgical change from C4-6 ACDF.  There is progressive adjacent segment disease at C6-7 with moderate size left paracentral medial foraminal disc extrusion causing cord flattening and moderate to severe left foraminal narrowing.  Probable left C7 nerve root encroachment.  Progressive spondylosis at C3-4 with resulting mild spinal stenosis and moderate foraminal narrowing bilaterally.  The minimal AP diameter of the spinal canal at C6-7 is 4 mm, representing severe stenosis. I have personally reviewed the images and agree with the above interpretation.  Assessment and Plan: Benjamin Fields is a pleasant 61 y.o. male with cervical myelopathy with radiculopathy due to adjacent segment disease at C6-7.  He has a prior C4-6 anterior cervical discectomy and fusion.  His C4-5 level is fused, but the C5-6 level is not clearly fully fused.  He had 15 years without issues from this, so I suspect this will not be an issue.  2 help fully evaluate his fusion, I have recommended CT scan of the cervical spine.  I will also send him for vocal cord clearance by otolaryngology.  He has objective weakness as well as difficulty with walking suggestive of cervical myelopathy.  His central canal at C6-7 is only 4 mm, which constitutes severe cervical stenosis.  There is an area which appears to have a small  amount of myelomalacia, though this is not clear.  Given his weakness and symptoms of cervical myelopathy with severe stenosis, cervical myelopathy with severe stenosis, surgical intervention is indicated.  Conservative management is not indicated for cervical myelopathy.  The question is whether to move forward with C6-7 anterior cervical discectomy and fusion versus disc arthroplasty.  He has some buckling of his ligamentum flavum, which would support moving forward with a fusion.  We will review his CT scan to make a final decision.   Thank you for involving me in the care of this patient.      Karman Veney K. Izora Ribas MD, [facility]

## 2022-08-06 ENCOUNTER — Ambulatory Visit
Admission: RE | Admit: 2022-08-06 | Discharge: 2022-08-06 | Disposition: A | Discharge status: Home / Self Care | Payer: Self-pay | Source: Ambulatory Visit | Attending: Neurosurgery | Admitting: Neurosurgery

## 2022-08-06 ENCOUNTER — Encounter: Payer: Self-pay | Admitting: Neurosurgery

## 2022-08-06 ENCOUNTER — Ambulatory Visit (INDEPENDENT_AMBULATORY_CARE_PROVIDER_SITE_OTHER): Payer: No Typology Code available for payment source | Admitting: Neurosurgery

## 2022-08-06 ENCOUNTER — Other Ambulatory Visit: Payer: Self-pay

## 2022-08-06 VITALS — BP 144/84 | Ht 70.0 in | Wt 193.6 lb

## 2022-08-06 DIAGNOSIS — M4712 Other spondylosis with myelopathy, cervical region: Secondary | ICD-10-CM

## 2022-08-06 DIAGNOSIS — Z981 Arthrodesis status: Secondary | ICD-10-CM

## 2022-08-06 DIAGNOSIS — M4722 Other spondylosis with radiculopathy, cervical region: Secondary | ICD-10-CM | POA: Diagnosis not present

## 2022-08-06 DIAGNOSIS — M4802 Spinal stenosis, cervical region: Secondary | ICD-10-CM | POA: Diagnosis not present

## 2022-08-06 DIAGNOSIS — R29898 Other symptoms and signs involving the musculoskeletal system: Secondary | ICD-10-CM

## 2022-08-06 DIAGNOSIS — Z049 Encounter for examination and observation for unspecified reason: Secondary | ICD-10-CM

## 2022-08-06 NOTE — Patient Instructions (Signed)
Please see below for information in regards to your upcoming surgery:  Planned surgery: C6-7 - to be determined after CT scan   Surgery date: 09/11/22 - you will find out your arrival time the business day before your surgery.   Pre-op appointment at [facility]: we will call you with a date/time for this. Pre-admit testing is located on the first floor of the [facility], [place], [address]. Please bring all prescriptions in the original prescription bottles to your appointment, even if you have reviewed medications by phone with a pharmacy representative. Pre-op labs may be done at your pre-op appointment. You are not required to fast for these labs. Should you need to change your pre-op appointment, please call Pre-admit testing at [phone].    Surgical clearance: we will send a referral to [facility]     *Does not apply if having an arthroplasty (disc replacement), but if having  fusion: NSAIDS (Non-steroidal anti-inflammatory drugs): no NSAIDS (such as ibuprofen, aleve, naproxen, meloxicam, diclofenac) for 3 months after surgery. Celebrex is an exception. Tylenol is ok because it is not an NSAID.   Because you are having a fusion or arthroplasty: for appointments after your 2 week follow-up: please arrive at the [facility] outpatient imaging center ([place], [place]) or [facility] one hour prior to your appointment for x-rays. This applies to every appointment after your 2 week follow-up. Failure to do so may result in your appointment being rescheduled.   If you have FMLA/disability paperwork, please drop it off or fax it to [phone], attention [place].   We can be reached by phone or mychart 8am-4pm, Monday-Friday. If you have any questions/concerns before or after surgery, you can reach [place] at [phone], or you can send a mychart message. If you have a concern after hours that  cannot wait until normal business hours, you can call [phone] and ask to page the neurosurgeon on call for [place].    Appointments/FMLA & disability paperwork: [place] Nurse: Ophelia Shoulder  Medical assistant: Raquel Sarna Physician Assistant's: Cooper Render & Geronimo Boot Surgeon: Meade Maw, MD

## 2022-08-07 ENCOUNTER — Encounter: Payer: Self-pay | Admitting: Neurosurgery

## 2022-08-16 ENCOUNTER — Ambulatory Visit (HOSPITAL_COMMUNITY): Admission: RE | Admit: 2022-08-16 | Payer: No Typology Code available for payment source | Source: Ambulatory Visit

## 2022-08-16 ENCOUNTER — Ambulatory Visit (HOSPITAL_COMMUNITY): Payer: No Typology Code available for payment source

## 2022-08-16 ENCOUNTER — Encounter (HOSPITAL_COMMUNITY): Payer: Self-pay

## 2022-08-16 ENCOUNTER — Encounter: Payer: Self-pay | Admitting: Neurosurgery

## 2022-08-23 ENCOUNTER — Other Ambulatory Visit: Payer: No Typology Code available for payment source

## 2022-08-27 ENCOUNTER — Telehealth: Payer: No Typology Code available for payment source | Admitting: Neurosurgery

## 2022-09-06 ENCOUNTER — Telehealth: Payer: Self-pay

## 2022-09-06 NOTE — Telephone Encounter (Signed)
Peer to peer scheduled with Dr. Burnell Blanks on Tuesday 09/10/22 at 9:45am

## 2022-09-06 NOTE — Telephone Encounter (Signed)
Holland Falling is trying to deny the patients Cervical CT that Dr. Izora Ribas ordered on 08/06/22. He does have symptoms of myelopathy and Dr. Izora Ribas ordered the CT so he could come up with a surgery plan. Would you be willing to do a peer to peer on this?  Phone # [phone]

## 2022-09-10 ENCOUNTER — Telehealth: Payer: Self-pay | Admitting: Orthopedic Surgery

## 2022-09-10 NOTE — Telephone Encounter (Signed)
Spoke with Dr. Burnell Blanks to do peer to peer.   CT of cervical spine is approved at [facility] from 08/28/22-03/09/23. Approval [id number].   I put paperwork on your desk.

## 2022-09-25 ENCOUNTER — Ambulatory Visit: Payer: 59

## 2022-09-26 ENCOUNTER — Encounter: Payer: No Typology Code available for payment source | Admitting: Neurosurgery

## 2022-10-01 ENCOUNTER — Ambulatory Visit
Admission: RE | Admit: 2022-10-01 | Discharge: 2022-10-01 | Disposition: A | Discharge status: Home / Self Care | Payer: 59 | Source: Ambulatory Visit | Attending: Neurosurgery | Admitting: Neurosurgery

## 2022-10-01 DIAGNOSIS — Z981 Arthrodesis status: Secondary | ICD-10-CM | POA: Insufficient documentation

## 2022-10-01 DIAGNOSIS — M4712 Other spondylosis with myelopathy, cervical region: Secondary | ICD-10-CM | POA: Insufficient documentation

## 2022-10-01 DIAGNOSIS — R29898 Other symptoms and signs involving the musculoskeletal system: Secondary | ICD-10-CM | POA: Insufficient documentation

## 2022-10-01 DIAGNOSIS — M4722 Other spondylosis with radiculopathy, cervical region: Secondary | ICD-10-CM | POA: Diagnosis present

## 2022-10-01 DIAGNOSIS — M4802 Spinal stenosis, cervical region: Secondary | ICD-10-CM

## 2022-10-08 ENCOUNTER — Ambulatory Visit (INDEPENDENT_AMBULATORY_CARE_PROVIDER_SITE_OTHER): Payer: 59 | Admitting: Neurosurgery

## 2022-10-08 DIAGNOSIS — R29898 Other symptoms and signs involving the musculoskeletal system: Secondary | ICD-10-CM | POA: Diagnosis not present

## 2022-10-08 DIAGNOSIS — M4722 Other spondylosis with radiculopathy, cervical region: Secondary | ICD-10-CM

## 2022-10-08 DIAGNOSIS — M4802 Spinal stenosis, cervical region: Secondary | ICD-10-CM | POA: Diagnosis not present

## 2022-10-08 DIAGNOSIS — M960 Pseudarthrosis after fusion or arthrodesis: Secondary | ICD-10-CM

## 2022-10-08 DIAGNOSIS — M4712 Other spondylosis with myelopathy, cervical region: Secondary | ICD-10-CM

## 2022-10-08 DIAGNOSIS — Z981 Arthrodesis status: Secondary | ICD-10-CM

## 2022-10-08 NOTE — Progress Notes (Signed)
Referring Physician:  No referring provider defined for this encounter.  Primary Physician:  Libby Maw, MD  History of Present Illness: 10/08/2022 He is feeling somewhat better than he was when I last saw him.  08/06/2022 Benjamin Fields is here today with a chief complaint of left sided neck pain that radiates into the shoulder along with weakness and numbness that radiates down into his fingers.  He has been having worsening symptoms over the past 2 months.  He reports sharp pain into his left shoulder blade and arm.  He has numbness and weakness in his left arm.  His pain is as bad as 10 out of 10 and made worse by flexion and extension of his neck or rotation of his neck.  Raising his arm helps a little bit.  He has had some trouble with buttoning buttons and opening jars.  He has had a fall, and feels slightly off balance.   Bowel/Bladder Dysfunction: none  Conservative measures:  Physical therapy: has participated in dry needling for the neck (3-4 sessions) at [facility] Multimodal medical therapy including regular antiinflammatories:  tramadol, cymbalta, gabapentin, aleve Injections: has not received any epidural steroid injections  Past Surgery:  C4-6 Fusion about 15 years ago By Dr. Trixie Dredge has symptoms of cervical myelopathy.  The symptoms are causing a significant impact on the patient's life.   I have utilized the care everywhere function in epic to review the outside records available from external health systems.  Review of Systems:  A 10 point review of systems is negative, except for the pertinent positives and negatives detailed in the HPI.  Past Medical History: Past Medical History:  Diagnosis Date   Allergy    mild- no meds    Anxiety    Hypertension    Neuromuscular disorder ([place])    mild nerve issue right leg    RA (rheumatoid arthritis) ([place])     Past Surgical History: Past Surgical History:   Procedure Laterality Date   CERVICAL SPINE SURGERY  2010   COLONOSCOPY     COLONOSCOPY  04/2020   TONSILLECTOMY      Allergies: Allergies as of 10/08/2022 - Review Complete 08/06/2022  Allergen Reaction Noted   Cortisone  03/26/2018    Medications: No outpatient medications have been marked as taking for the 10/08/22 encounter (Office Visit) with Meade Maw, MD.    Social History: Social History   Tobacco Use   Smoking status: Never   Smokeless tobacco: Never  Vaping Use   Vaping Use: Never used  Substance Use Topics   Alcohol use: Never   Drug use: Never    Family Medical History: Family History  Problem Relation Age of Onset   Parkinson's disease Mother    Atrial fibrillation Mother    Heart failure Father    Hypertension Father    Prostate cancer Father    Colon cancer Neg Hx    Colon polyps Neg Hx    Esophageal cancer Neg Hx    Rectal cancer Neg Hx    Stomach cancer Neg Hx     Physical Examination: There were no vitals filed for this visit.    Medical Decision Making  Imaging: MRI cervical spine on July 19, 2022 shows postsurgical change from C4-6 ACDF.  There is progressive adjacent segment disease at C6-7 with moderate size left paracentral medial foraminal disc extrusion causing cord flattening and moderate to severe left foraminal narrowing.  Probable  left C7 nerve root encroachment.  Progressive spondylosis at C3-4 with resulting mild spinal stenosis and moderate foraminal narrowing bilaterally.  The minimal AP diameter of the spinal canal at C6-7 is 4 mm, representing severe stenosis.  CT scan of the cervical spine on October 01, 2022  IMPRESSION: 1. Anterior and interbody fusion changes at C4-5 and 0000000 without complicating features. 2. Advanced right-sided facet disease and uncinate spurring contributing to right foraminal stenosis at C3-4. 3. The disc protrusion seen on the recent MRI from November 2023 is not as well  demonstrated on the CT scan.     Electronically Signed   By: Marijo Sanes M.D.   On: 10/03/2022 08:42   Please note that there is incomplete fusion at the C5-6 level.  I have personally reviewed the images and agree with the above interpretation.  Assessment and Plan: Benjamin Fields is a pleasant 62 y.o. male with cervical myelopathy with radiculopathy due to adjacent segment disease at C6-7.  He has a prior C4-6 anterior cervical discectomy and fusion.  His C4-5 level is fused, but the C5-6 level is not clearly fused.   He will get his vocal cords evaluated next week.  I will see him back in 2 months or sooner if his symptoms worsen. If his symptoms worsen, we will proceed with C6-7 anterior cervical discectomy and fusion with plating from C5-7.  Thank you for involving me in the care of this patient.   This visit was performed via telephone.  Patient location: home Provider location: office  I spent a total of 5 minutes non-face-to-face activities for this visit on the date of this encounter including review of current clinical condition and response to treatment.  The patient is aware of and accepts the limits of this telehealth visit.     Ladislao Cohenour K. Izora Ribas MD, [facility]surgery

## 2022-10-24 ENCOUNTER — Encounter: Payer: No Typology Code available for payment source | Admitting: Neurosurgery

## 2022-12-03 ENCOUNTER — Encounter: Payer: No Typology Code available for payment source | Admitting: Neurosurgery

## 2022-12-05 ENCOUNTER — Ambulatory Visit: Payer: 59 | Admitting: Neurosurgery

## 2022-12-05 ENCOUNTER — Encounter: Payer: Self-pay | Admitting: Neurosurgery

## 2022-12-05 VITALS — BP 140/80 | HR 78 | Ht 70.0 in | Wt 196.0 lb

## 2022-12-05 DIAGNOSIS — G959 Disease of spinal cord, unspecified: Secondary | ICD-10-CM | POA: Diagnosis not present

## 2022-12-05 DIAGNOSIS — M4802 Spinal stenosis, cervical region: Secondary | ICD-10-CM

## 2022-12-05 DIAGNOSIS — M5412 Radiculopathy, cervical region: Secondary | ICD-10-CM

## 2022-12-05 NOTE — Progress Notes (Signed)
Referring Physician:  Mliss Sax, MD [address] [place],  [place] [address]  Primary Physician:  Mliss Sax, MD  History of Present Illness: 12/05/2022 Mr. Muffler continues to do better.  His balance is improved.  He has no grip weakness.  He is not complaining of any significant numbness currently.  10/08/2022 He is feeling somewhat better than he was when I last saw him.  08/06/2022 Mr. Florin Feffer is here today with a chief complaint of left sided neck pain that radiates into the shoulder along with weakness and numbness that radiates down into his fingers.  He has been having worsening symptoms over the past 2 months.  He reports sharp pain into his left shoulder blade and arm.  He has numbness and weakness in his left arm.  His pain is as bad as 10 out of 10 and made worse by flexion and extension of his neck or rotation of his neck.  Raising his arm helps a little bit.  He has had some trouble with buttoning buttons and opening jars.  He has had a fall, and feels slightly off balance.   Bowel/Bladder Dysfunction: none  Conservative measures:  Physical therapy: has participated in dry needling for the neck (3-4 sessions) at [facility] Multimodal medical therapy including regular antiinflammatories:  tramadol, cymbalta, gabapentin, aleve Injections: has not received any epidural steroid injections  Past Surgery:  C4-6 Fusion about 15 years ago By Dr. Terence Lux has symptoms of cervical myelopathy.  The symptoms are causing a significant impact on the patient's life.   I have utilized the care everywhere function in epic to review the outside records available from external health systems.  Review of Systems:  A 10 point review of systems is negative, except for the pertinent positives and negatives detailed in the HPI.  Past Medical History: Past Medical History:  Diagnosis Date   Allergy    mild- no  meds    Anxiety    Hypertension    Neuromuscular disorder    mild nerve issue right leg    RA (rheumatoid arthritis)     Past Surgical History: Past Surgical History:  Procedure Laterality Date   CERVICAL SPINE SURGERY  2010   COLONOSCOPY     COLONOSCOPY  04/2020   TONSILLECTOMY      Allergies: Allergies as of 12/05/2022 - Review Complete 08/06/2022  Allergen Reaction Noted   Cortisone  03/26/2018    Medications: Current Meds  Medication Sig   diclofenac Sodium (VOLTAREN) 1 % GEL Apply a small grape sized dollop to lateral epicondyles 3 times daily.   DULoxetine (CYMBALTA) 60 MG capsule Take 1 capsule (60 mg total) by mouth daily.   Elastic Bandages & Supports (TENNIS ELBOW STRAP) MISC As directed   gabapentin (NEURONTIN) 100 MG capsule Take 1 capsule in am, 1 capsule at lunch and 3 capsules at bedtime.   losartan (COZAAR) 50 MG tablet Take 1 tablet by mouth once daily   Multiple Vitamin (MULTIVITAMIN) tablet Take 1 tablet by mouth daily. OTC    Social History: Social History   Tobacco Use   Smoking status: Never   Smokeless tobacco: Never  Vaping Use   Vaping Use: Never used  Substance Use Topics   Alcohol use: Never   Drug use: Never    Family Medical History: Family History  Problem Relation Age of Onset   Parkinson's disease Mother    Atrial fibrillation Mother  Heart failure Father    Hypertension Father    Prostate cancer Father    Colon cancer Neg Hx    Colon polyps Neg Hx    Esophageal cancer Neg Hx    Rectal cancer Neg Hx    Stomach cancer Neg Hx     Physical Examination: Vitals:   12/05/22 0920  BP: (!) 140/80  Pulse: 78  SpO2: 96%   5 out of 5 strength in his bilateral upper extremities.  Sensation is intact to light touch.  His gait is normal.  Medical Decision Making  Imaging: MRI cervical spine on July 19, 2022 shows postsurgical change from C4-6 ACDF.  There is progressive adjacent segment disease at C6-7 with moderate  size left paracentral medial foraminal disc extrusion causing cord flattening and moderate to severe left foraminal narrowing.  Probable left C7 nerve root encroachment.  Progressive spondylosis at C3-4 with resulting mild spinal stenosis and moderate foraminal narrowing bilaterally.  The minimal AP diameter of the spinal canal at C6-7 is 4 mm, representing severe stenosis.  CT scan of the cervical spine on October 01, 2022  IMPRESSION: 1. Anterior and interbody fusion changes at C4-5 and C5-6 without complicating features. 2. Advanced right-sided facet disease and uncinate spurring contributing to right foraminal stenosis at C3-4. 3. The disc protrusion seen on the recent MRI from November 2023 is not as well demonstrated on the CT scan.     Electronically Signed   By: Rudie MeyerP.  Gallerani M.D.   On: 10/03/2022 08:42   Please note that there is incomplete fusion at the C5-6 level.  I have personally reviewed the images and agree with the above interpretation.  Assessment and Plan: Mr. Littie DeedsGentry is a pleasant 62 y.o. male with cervical myelopathy with radiculopathy due to adjacent segment disease at C6-7.  He has a prior C4-6 anterior cervical discectomy and fusion.  His C4-5 level is fused, but the C5-6 level is not clearly fused.   His symptoms are currently much improved compared to when I first saw him.  At this point, we will hold off from further consideration of surgical intervention.  He will continue to monitor his symptoms and will let me know if he has any issues with grip strength, upper extremity weakness, numbness in his hands or arms, or worsening balance.  If any of those occur, he will let me know we will consider intervention as noted below.   If his symptoms worsen, we will proceed with C6-7 anterior cervical discectomy and fusion with plating from C5-7.  I spent a total of 10 minutes in this patient's care today. This time was spent reviewing pertinent records including imaging  studies, obtaining and confirming history, performing a directed evaluation, formulating and discussing my recommendations, and documenting the visit within the medical record.       Dreshaun Stene K. Myer HaffYarbrough MD, [facility]

## 2022-12-11 ENCOUNTER — Encounter: Payer: Self-pay | Admitting: Neurosurgery

## 2022-12-31 ENCOUNTER — Encounter: Payer: 59 | Admitting: Family Medicine

## 2023-01-16 ENCOUNTER — Encounter: Payer: Self-pay | Admitting: Family Medicine

## 2023-01-17 ENCOUNTER — Ambulatory Visit: Payer: 59 | Admitting: Family Medicine

## 2023-01-17 VITALS — BP 126/70 | HR 89 | Temp 98.7°F | Ht 70.0 in | Wt 189.4 lb

## 2023-01-17 DIAGNOSIS — M25461 Effusion, right knee: Secondary | ICD-10-CM

## 2023-01-17 HISTORY — DX: Effusion, right knee: M25.461

## 2023-01-17 MED ORDER — MELOXICAM 7.5 MG PO TABS: 7.5000 mg | ORAL_TABLET | Freq: Every day | ORAL | 0 refills | Status: DC

## 2023-01-17 NOTE — Progress Notes (Addendum)
Established Patient Office Visit   Subjective:  Patient ID: Benjamin Fields, male    DOB: 08-23-1961  Age: 62 y.o. MRN: [id number]  Chief Complaint  Patient presents with   Edema    Swelling in right knee x 2 months come and go also concerns about fever in knee and body temp.     HPI Encounter Diagnoses  Name Primary?   Effusion of right knee Yes   For evaluation of recurring right knee effusion.  Was seen back in March and emergency Ortho clinic and was given a steroid injection.  This seemed to help for a while but was seen again in April and given a 6-day prednisone taper a recurrent effusion.  Knee improved until the end of the 6-day taper.  No family or personal history of gout.  He does feel as though the knee has been unstable and has tried to give way on him.  He knows of no injury to this today.  He does report nighttime elevation in his temperature to 100 degrees.   ROS   Current Outpatient Medications:    diclofenac Sodium (VOLTAREN) 1 % GEL, Apply a small grape sized dollop to lateral epicondyles 3 times daily., Disp: 150 g, Rfl: 1   Elastic Bandages & Supports (TENNIS ELBOW STRAP) MISC, As directed, Disp: 1 each, Rfl: 2   losartan (COZAAR) 50 MG tablet, Take 1 tablet by mouth once daily, Disp: 90 tablet, Rfl: 2   meloxicam (MOBIC) 7.5 MG tablet, Take 1 tablet (7.5 mg total) by mouth daily., Disp: 30 tablet, Rfl: 0   Multiple Vitamin (MULTIVITAMIN) tablet, Take 1 tablet by mouth daily. OTC, Disp: , Rfl:    tiZANidine (ZANAFLEX) 2 MG tablet, Take 2 mg by mouth at bedtime., Disp: , Rfl:    traMADol (ULTRAM) 50 MG tablet, Take 50 mg by mouth every 6 (six) hours as needed., Disp: , Rfl:    DULoxetine (CYMBALTA) 60 MG capsule, Take 1 capsule (60 mg total) by mouth daily. (Patient not taking: Reported on 01/17/2023), Disp: 90 capsule, Rfl: 4   gabapentin (NEURONTIN) 100 MG capsule, Take 1 capsule in am, 1 capsule at lunch and 3 capsules at bedtime., Disp: 450 capsule, Rfl:  1  Current Facility-Administered Medications:    triamcinolone acetonide (KENALOG-40) injection 40 mg, 40 mg, Intramuscular, Once, Mliss Sax, MD   Objective:     BP 126/70 (BP Location: Right Arm, Patient Position: Sitting, Cuff Size: Normal)   Pulse 89   Temp 98.7 F (37.1 C) (Temporal)   Ht 5\' 10"  (1.778 m)   Wt 189 lb 6.4 oz (85.9 kg)   SpO2 95%   BMI 27.18 kg/m    Physical Exam Constitutional:      General: He is not in acute distress.    Appearance: Normal appearance. He is not ill-appearing, toxic-appearing or diaphoretic.  HENT:     Head: Normocephalic and atraumatic.     Right Ear: External ear normal.     Left Ear: External ear normal.  Eyes:     General: No scleral icterus.       Right eye: No discharge.        Left eye: No discharge.     Extraocular Movements: Extraocular movements intact.     Conjunctiva/sclera: Conjunctivae normal.  Pulmonary:     Effort: Pulmonary effort is normal. No respiratory distress.  Musculoskeletal:     Right knee: Swelling and effusion present. No erythema or bony tenderness. Normal range of  motion. No tenderness.     Comments: 10 cc of effusion. No erythema.   Skin:    General: Skin is warm and dry.  Neurological:     Mental Status: He is alert and oriented to person, place, and time.  Psychiatric:        Mood and Affect: Mood normal.        Behavior: Behavior normal.    Aspiration/Injection Procedure Note Benjamin Fields [id number] 04-02-1961  Procedure: Injection Indications: swelling with small effusion  Procedure Details Consent: Risks of procedure as well as the alternatives and risks of each were explained to the (patient/caregiver).  Consent for procedure obtained. Time Out: Verified patient identification, verified procedure, site/side was marked, verified correct patient position, special equipment/implants available, medications/allergies/relevent history reviewed, required imaging and test results  available.  Performed. With the patient in the seated position inferior medial patella was identified and marked.  Was cleansed with Betadine x 3.  2 cc of 2% lidocaine were injected for local anesthesia.  A 60 cc syringe with an 18-gauge needle was inserted into the joint space.  Unfortunately no fluid was returned.  The joint was then injected with 1 cc of Kenalog and 2 cc of 2% lidocaine.  Patient tolerated well.  Local Anesthesia Used:Lidocaine 2% plain; 2mL Amount of Fluid Aspirated: minimal amount Character of Fluid: na Fluid was sent for: na A sterile dressing was applied.  Patient did tolerate procedure well. Estimated blood loss: 0  Mliss Sax 01/31/2023, 4:57 PM   No results found for any visits on 01/17/23.    The 10-year ASCVD risk score (Arnett DK, et al., 2019) is: 14.2%    Assessment & Plan:   Effusion of right knee -     Meloxicam; Take 1 tablet (7.5 mg total) by mouth daily.  Dispense: 30 tablet; Refill: 0 -     Ambulatory referral to Orthopedic Surgery -     CBC with Differential/Platelet; Future -     Uric acid; Future -     Triamcinolone Acetonide    Return if symptoms worsen or fail to improve.  Warned patient about inflammatory reaction within the joint after the steroid injection.  This should resolve rapidly over a day or so.  If not return for reevaluation or proceed to emergency room.  Will use meloxicam and Voltaren gel for Benjamin Fields.  Orthopedic referral recurrent nature of the effusion.  Instability suggest internal derangement.  Could consider gout or pseudogout.  No reason to think about an infectious etiology at this time without pain in the leg and erythema.  Checking CBC with differential and uric acid.  Mliss Sax, MD

## 2023-01-21 ENCOUNTER — Other Ambulatory Visit (INDEPENDENT_AMBULATORY_CARE_PROVIDER_SITE_OTHER): Payer: 59

## 2023-01-21 DIAGNOSIS — M25461 Effusion, right knee: Secondary | ICD-10-CM

## 2023-01-21 LAB — CBC WITH DIFFERENTIAL/PLATELET
Basophils Absolute: 0.1 10*3/uL (ref 0.0–0.1)
Basophils Relative: 1.2 % (ref 0.0–3.0)
Eosinophils Absolute: 0.4 10*3/uL (ref 0.0–0.7)
Eosinophils Relative: 4.1 % (ref 0.0–5.0)
HCT: 40.8 % (ref 39.0–52.0)
Hemoglobin: 13.8 g/dL (ref 13.0–17.0)
Lymphocytes Relative: 19.4 % (ref 12.0–46.0)
Lymphs Abs: 1.7 10*3/uL (ref 0.7–4.0)
MCHC: 33.9 g/dL (ref 30.0–36.0)
MCV: 92 fl (ref 78.0–100.0)
Monocytes Absolute: 0.6 10*3/uL (ref 0.1–1.0)
Monocytes Relative: 7.3 % (ref 3.0–12.0)
Neutro Abs: 5.9 10*3/uL (ref 1.4–7.7)
Neutrophils Relative %: 68 % (ref 43.0–77.0)
Platelets: 378 10*3/uL (ref 150.0–400.0)
RBC: 4.44 Mil/uL (ref 4.22–5.81)
RDW: 13.8 % (ref 11.5–15.5)
WBC: 8.7 10*3/uL (ref 4.0–10.5)

## 2023-01-21 LAB — URIC ACID: Uric Acid, Serum: 5.6 mg/dL (ref 4.0–7.8)

## 2023-01-24 ENCOUNTER — Encounter: Payer: Self-pay | Admitting: Family Medicine

## 2023-01-24 DIAGNOSIS — G629 Polyneuropathy, unspecified: Secondary | ICD-10-CM

## 2023-01-24 MED ORDER — GABAPENTIN 100 MG PO CAPS: ORAL_CAPSULE | ORAL | 1 refills | Status: DC

## 2023-01-24 NOTE — Telephone Encounter (Signed)
Please advise message below, patient requested refill on Gabapentin last RF 02/27/22 last OV 01/17/23.

## 2023-01-27 MED ORDER — TRIAMCINOLONE ACETONIDE 40 MG/ML IJ SUSP: 40.0000 mg | Freq: Once | INTRAMUSCULAR | Status: DC

## 2023-01-27 NOTE — Addendum Note (Signed)
Addended by: Nadene Rubins A on: 01/27/2023 11:05 AM   Modules accepted: Orders

## 2023-01-31 MED ORDER — TRIAMCINOLONE ACETONIDE 40 MG/ML IJ SUSP: 40.0000 mg | Freq: Once | INTRAMUSCULAR | Status: DC

## 2023-01-31 NOTE — Addendum Note (Signed)
Addended by: Andrez Grime on: 01/31/2023 04:57 PM   Modules accepted: Orders

## 2023-02-07 MED ORDER — TRIAMCINOLONE ACETONIDE 40 MG/ML IJ SUSP
40.0000 mg | Freq: Once | INTRAMUSCULAR | Status: AC
  Administered 2023-01-17: 40 mg via INTRA_ARTICULAR

## 2023-02-07 NOTE — Addendum Note (Signed)
Addended by: Larey Dresser on: 02/07/2023 01:22 PM   Modules accepted: Orders

## 2023-02-14 MED ORDER — TRIAMCINOLONE ACETONIDE 40 MG/ML IJ SUSP: 40.0000 mg | Freq: Once | INTRAMUSCULAR | Status: DC

## 2023-02-14 NOTE — Addendum Note (Signed)
Addended by: Andrez Grime on: 02/14/2023 12:56 PM   Modules accepted: Orders

## 2023-03-26 ENCOUNTER — Telehealth: Payer: Self-pay | Admitting: Family Medicine

## 2023-03-26 NOTE — Telephone Encounter (Signed)
Pt's wife, Jeanella Flattery, called asking if [facility] would accept pt as a toc? Jeanella Flattery states the pt is currently under the care of Dr. Nadene Rubins at [facility]. Jeanella Flattery states that her & the pt recently moved to [place] & our office is closer to their home. Is this toc okay? Call back # [phone]

## 2023-03-26 NOTE — Telephone Encounter (Signed)
TOC is ok with me if cleared by Dr. Doreene Burke

## 2023-05-09 ENCOUNTER — Encounter: Payer: Self-pay | Admitting: Family Medicine

## 2023-05-09 DIAGNOSIS — F418 Other specified anxiety disorders: Secondary | ICD-10-CM

## 2023-05-11 ENCOUNTER — Encounter: Payer: Self-pay | Admitting: Family Medicine

## 2023-05-12 MED ORDER — DULOXETINE HCL 60 MG PO CPEP: 60.0000 mg | ORAL_CAPSULE | Freq: Every day | ORAL | 1 refills | Status: DC

## 2023-05-25 ENCOUNTER — Telehealth: Payer: 59 | Admitting: Family

## 2023-05-25 DIAGNOSIS — M545 Low back pain, unspecified: Secondary | ICD-10-CM

## 2023-05-25 MED ORDER — NAPROXEN 500 MG PO TABS: 500.0000 mg | ORAL_TABLET | Freq: Two times a day (BID) | ORAL | 0 refills | Status: DC

## 2023-05-25 MED ORDER — PREDNISONE 10 MG (21) PO TBPK: ORAL_TABLET | ORAL | 0 refills | Status: DC

## 2023-05-25 MED ORDER — BACLOFEN 10 MG PO TABS: 10.0000 mg | ORAL_TABLET | Freq: Three times a day (TID) | ORAL | 0 refills | Status: DC

## 2023-05-25 NOTE — Progress Notes (Signed)

## 2023-05-26 ENCOUNTER — Encounter: Payer: Self-pay | Admitting: Internal Medicine

## 2023-05-26 ENCOUNTER — Ambulatory Visit: Payer: 59 | Admitting: Internal Medicine

## 2023-05-26 VITALS — BP 128/84 | HR 79 | Temp 98.1°F | Ht 70.0 in | Wt 190.2 lb

## 2023-05-26 DIAGNOSIS — M545 Low back pain, unspecified: Secondary | ICD-10-CM

## 2023-05-26 LAB — POC URINALSYSI DIPSTICK (AUTOMATED)
Bilirubin, UA: NEGATIVE
Blood, UA: NEGATIVE
Glucose, UA: NEGATIVE
Ketones, UA: NEGATIVE
Leukocytes, UA: NEGATIVE
Nitrite, UA: NEGATIVE
Protein, UA: POSITIVE
Spec Grav, UA: 1.02 (ref 1.010–1.025)
Urobilinogen, UA: 0.2 U/dL
pH, UA: 6 (ref 5.0–8.0)

## 2023-05-26 NOTE — Patient Instructions (Addendum)
Pick up medications prescribed from your e-visit  Heating pad  Rest, no heavy lifting.

## 2023-05-26 NOTE — Progress Notes (Signed)
[facility] [address] [place] [address] Dept: [phone] Dept Fax: [phone]  Acute Care Office Visit  Subjective:   Benjamin Fields Jun 26, 1961 05/26/2023  Chief Complaint  Patient presents with   Flank Pain    Hurts when standing, sitting and laying Started a week ago and getting worse     HPI: BACK PAIN:  Benjamin Fields presents with complaint of left side pain and back pain. Patient did e-visit yesterday for back pain and was prescribed naproxen 500 mg twice daily, baclofen 10 mg every 8 hours as needed, and prednisone Dosepak.  Patient has not picked up these prescriptions yet.  Onset: 1 week ago  Location: left rib cage  Recent Injury: not that patient is aware of. No recent falls  Character: sharp  Radiation of Pain: no  Aggravating Factors: walking for long periods of time, laying down, standing  Alleviating Factors: sitting   ASSOCIATED SYMPTOMS:  Rash: no Chills: yes - reports last night, but have resolved. No fever.  Dysuria: no Abdominal Pain: no Nausea: no Vomiting: no Hematuria: no Bowel or Bladder Dysfunction: no Numbness: no Weakness: no    The following portions of the patient's history were reviewed and updated as appropriate: past medical history, past surgical history, family history, social history, allergies, medications, and problem list.   Patient Active Problem List   Diagnosis Date Noted   Effusion of right knee 01/17/2023   History of fusion of cervical spine 07/30/2022   Spinal stenosis of cervical region 07/30/2022   Cervical disc herniation 07/30/2022   Lateral epicondylitis of right elbow 12/04/2021   Snores 10/18/2021   Elevated triglycerides with high cholesterol 04/12/2021   Abdominal bloating 03/14/2021   Depression with anxiety 12/22/2020   Low back pain 02/17/2020   Left hip pain 02/17/2020   Healthcare maintenance 11/16/2019   Rheumatoid arthritis  of multiple sites with negative rheumatoid factor (HCC) 05/19/2018   Tinea versicolor 05/19/2018   Essential hypertension 03/17/2018   DDD (degenerative disc disease), cervical 03/16/2018   Past Medical History:  Diagnosis Date   Allergy    mild- no meds    Anxiety    Hypertension    Neuromuscular disorder (HCC)    mild nerve issue right leg    RA (rheumatoid arthritis) (HCC)    Past Surgical History:  Procedure Laterality Date   CERVICAL SPINE SURGERY  2010   COLONOSCOPY     COLONOSCOPY  04/2020   TONSILLECTOMY     Family History  Problem Relation Age of Onset   Parkinson's disease Mother    Atrial fibrillation Mother    Heart failure Father    Hypertension Father    Prostate cancer Father    Colon cancer Neg Hx    Colon polyps Neg Hx    Esophageal cancer Neg Hx    Rectal cancer Neg Hx    Stomach cancer Neg Hx     Current Outpatient Medications:    diclofenac Sodium (VOLTAREN) 1 % GEL, Apply a small grape sized dollop to lateral epicondyles 3 times daily., Disp: 150 g, Rfl: 1   gabapentin (NEURONTIN) 100 MG capsule, Take 1 capsule in am, 1 capsule at lunch and 3 capsules at bedtime., Disp: 450 capsule, Rfl: 1   losartan (COZAAR) 50 MG tablet, Take 1 tablet by mouth once daily, Disp: 90 tablet, Rfl: 2   meloxicam (MOBIC) 7.5 MG tablet, Take 1 tablet (7.5 mg total) by mouth daily., Disp: 30  tablet, Rfl: 0   Multiple Vitamin (MULTIVITAMIN) tablet, Take 1 tablet by mouth daily. OTC, Disp: , Rfl:    baclofen (LIORESAL) 10 MG tablet, Take 1 tablet (10 mg total) by mouth 3 (three) times daily., Disp: 30 each, Rfl: 0   DULoxetine (CYMBALTA) 60 MG capsule, Take 1 capsule (60 mg total) by mouth daily., Disp: 90 capsule, Rfl: 1   Elastic Bandages & Supports (TENNIS ELBOW STRAP) MISC, As directed, Disp: 1 each, Rfl: 2   naproxen (NAPROSYN) 500 MG tablet, Take 1 tablet (500 mg total) by mouth 2 (two) times daily with a meal., Disp: 30 tablet, Rfl: 0   predniSONE (STERAPRED UNI-PAK  21 TAB) 10 MG (21) TBPK tablet, Use as directed, Disp: 21 tablet, Rfl: 0  Current Facility-Administered Medications:    triamcinolone acetonide (KENALOG-40) injection 40 mg, 40 mg, Intra-articular, Once, Mliss Sax, MD Allergies  Allergen Reactions   Cortisone     FLARE     ROS: A complete ROS was performed with pertinent positives/negatives noted in the HPI. The remainder of the ROS are negative.    Objective:   Today's Vitals   05/26/23 0919  BP: 128/84  Pulse: 79  Temp: 98.1 F (36.7 C)  TempSrc: Temporal  SpO2: 98%  Weight: 190 lb 3.2 oz (86.3 kg)  Height: 5\' 10"  (1.778 m)    GENERAL: Well-appearing, in NAD. Well nourished.  SKIN: Pink, warm and dry. No rash, lesion, ulceration. No ecchymoses.  NECK: Trachea midline. Full ROM w/o pain or tenderness.  RESPIRATORY: Chest wall symmetrical. Respirations even and non-labored. Breath sounds clear to auscultation bilaterally.  CARDIAC: S1, S2 present, regular rate and rhythm. Peripheral pulses 2+ bilaterally.  GI: Abdomen soft, non-tender. No CVA tenderness.  MSK: Muscle tone and strength appropriate for age. Joints w/o tenderness, redness, or swelling. No spinal tenderness. TTP to left side of rib cage and left lower back.  EXTREMITIES: Without clubbing, cyanosis, or edema.  NEUROLOGIC: No motor or sensory deficits. Steady, even gait.  PSYCH/MENTAL STATUS: Alert, oriented x 3. Cooperative, appropriate mood and affect.    Results for orders placed or performed in visit on 05/26/23  POCT Urinalysis Dipstick (Automated)  Result Value Ref Range   Color, UA yellow    Clarity, UA clear    Glucose, UA Negative Negative   Bilirubin, UA neg    Ketones, UA neg    Spec Grav, UA 1.020 1.010 - 1.025   Blood, UA neg    pH, UA 6.0 5.0 - 8.0   Protein, UA Positive Negative   Urobilinogen, UA 0.2 0.2 or 1.0 E.U./dL   Nitrite, UA neg    Leukocytes, UA Negative Negative      Assessment & Plan:  1. Acute low back pain  without sciatica, unspecified back pain laterality - POCT Urinalysis Dipstick (Automated) - start Naproxen, Baclofen, and prednisone pack as prescribed from e-visit.  - heating pad PRN  - rest, no heavy lifting    Orders Placed This Encounter  Procedures   POCT Urinalysis Dipstick (Automated)   Lab Orders         POCT Urinalysis Dipstick (Automated)     No images are attached to the encounter or orders placed in the encounter.  Return if symptoms worsen or fail to improve.   Salvatore Decent, FNP

## 2023-05-27 ENCOUNTER — Telehealth: Payer: Self-pay | Admitting: Family Medicine

## 2023-05-27 NOTE — Telephone Encounter (Signed)
Work note sent via [employer].

## 2023-05-27 NOTE — Telephone Encounter (Signed)
Benjamin Fields [phone]  Pt stated he is feeling some dizziness from the new med. It gets better as the day goes on. He would like a work note because he would not feel safe driving. He would like it sent through mychart.

## 2023-05-28 ENCOUNTER — Other Ambulatory Visit: Payer: Self-pay

## 2023-05-28 ENCOUNTER — Encounter: Payer: Self-pay | Admitting: Emergency Medicine

## 2023-05-28 ENCOUNTER — Emergency Department
Admission: EM | Admit: 2023-05-28 | Discharge: 2023-05-28 | Disposition: A | Discharge status: Home / Self Care | Payer: 59 | Attending: Emergency Medicine | Admitting: Emergency Medicine

## 2023-05-28 ENCOUNTER — Emergency Department: Payer: 59

## 2023-05-28 DIAGNOSIS — M5414 Radiculopathy, thoracic region: Secondary | ICD-10-CM | POA: Insufficient documentation

## 2023-05-28 DIAGNOSIS — R109 Unspecified abdominal pain: Secondary | ICD-10-CM | POA: Diagnosis present

## 2023-05-28 LAB — COMPREHENSIVE METABOLIC PANEL
ALT: 29 U/L (ref 0–44)
AST: 20 U/L (ref 15–41)
Albumin: 4.5 g/dL (ref 3.5–5.0)
Alkaline Phosphatase: 54 U/L (ref 38–126)
Anion gap: 7 (ref 5–15)
BUN: 20 mg/dL (ref 8–23)
CO2: 24 mmol/L (ref 22–32)
Calcium: 9.2 mg/dL (ref 8.9–10.3)
Chloride: 106 mmol/L (ref 98–111)
Creatinine, Ser: 1.39 mg/dL — ABNORMAL HIGH (ref 0.61–1.24)
GFR, Estimated: 57 mL/min — ABNORMAL LOW (ref >60)
Glucose, Bld: 148 mg/dL — ABNORMAL HIGH (ref 70–99)
Potassium: 4.5 mmol/L (ref 3.5–5.1)
Sodium: 137 mmol/L (ref 135–145)
Total Bilirubin: 0.8 mg/dL (ref 0.3–1.2)
Total Protein: 7.3 g/dL (ref 6.5–8.1)

## 2023-05-28 LAB — CBC
HCT: 46.3 % (ref 39.0–52.0)
Hemoglobin: 15.8 g/dL (ref 13.0–17.0)
MCH: 31 pg (ref 26.0–34.0)
MCHC: 34.1 g/dL (ref 30.0–36.0)
MCV: 91 fL (ref 80.0–100.0)
Platelets: 228 10*3/uL (ref 150–400)
RBC: 5.09 MIL/uL (ref 4.22–5.81)
RDW: 14 % (ref 11.5–15.5)
WBC: 16.2 10*3/uL — ABNORMAL HIGH (ref 4.0–10.5)
nRBC: 0 % (ref 0.0–0.2)

## 2023-05-28 LAB — URINALYSIS, ROUTINE W REFLEX MICROSCOPIC
Bilirubin Urine: NEGATIVE
Glucose, UA: NEGATIVE mg/dL
Hgb urine dipstick: NEGATIVE
Ketones, ur: NEGATIVE mg/dL
Leukocytes,Ua: NEGATIVE
Nitrite: NEGATIVE
Protein, ur: NEGATIVE mg/dL
Specific Gravity, Urine: 1.025 (ref 1.005–1.030)
pH: 5 (ref 5.0–8.0)

## 2023-05-28 LAB — LIPASE, BLOOD: Lipase: 26 U/L (ref 11–51)

## 2023-05-28 MED ORDER — HYDROCODONE-ACETAMINOPHEN 5-325 MG PO TABS: 1.0000 | ORAL_TABLET | Freq: Four times a day (QID) | ORAL | 0 refills | Status: DC | PRN

## 2023-05-28 MED ORDER — ONDANSETRON 4 MG PO TBDP: 4.0000 mg | ORAL_TABLET | Freq: Three times a day (TID) | ORAL | 0 refills | Status: DC | PRN

## 2023-05-28 MED ORDER — HYDROCODONE-ACETAMINOPHEN 5-325 MG PO TABS
2.0000 | ORAL_TABLET | Freq: Once | ORAL | Status: AC
  Administered 2023-05-28: 2 via ORAL
  Filled 2023-05-28: qty 2

## 2023-05-28 MED ORDER — PANTOPRAZOLE SODIUM 40 MG PO TBEC: 40.0000 mg | DELAYED_RELEASE_TABLET | Freq: Every day | ORAL | 0 refills | Status: DC

## 2023-05-28 MED ORDER — PREDNISONE 10 MG PO TABS: ORAL_TABLET | ORAL | 0 refills | Status: AC

## 2023-05-28 MED ORDER — ONDANSETRON 4 MG PO TBDP
4.0000 mg | ORAL_TABLET | Freq: Once | ORAL | Status: AC
  Administered 2023-05-28: 4 mg via ORAL
  Filled 2023-05-28: qty 1

## 2023-05-28 MED ORDER — KETOROLAC TROMETHAMINE 60 MG/2ML IM SOLN
60.0000 mg | Freq: Once | INTRAMUSCULAR | Status: AC
  Administered 2023-05-28: 60 mg via INTRAMUSCULAR
  Filled 2023-05-28: qty 2

## 2023-05-28 NOTE — ED Notes (Signed)
Provided pt with discharge instructions and education. All of pt questions answered. Pt in possession of all belongings. Pt AAOX4 and stable at time of discharge.Pt ambulated w/ steady gait towards ED exit. Pt accompanied by family member.

## 2023-05-28 NOTE — ED Notes (Signed)
ED Provider at bedside. 

## 2023-05-28 NOTE — ED Triage Notes (Addendum)
Pt in with LLQ pain that radiates to L low back, was evaluated at his PCP and they decided kidney stone was unlikely. Pain has worsened since, and is worse with movement or lying flat. He states only pain relief is with heating pad. States Baclofen, Naproxyn and Prednisone are not helping

## 2023-05-28 NOTE — ED Provider Notes (Signed)
[facility] Provider Note    Event Date/Time   First MD Initiated Contact with Patient 05/28/23 1947     (approximate)   History   Abdominal Pain and Back Pain   HPI  Benjamin Fields is a 62 y.o. male here with left flank pain.  The patient states that for the last week, he has had aching, throbbing, persistent, left lower flank pain.  He states the pain has been fairly constant.  Denies specific injuries.  He saw his PCP for this and they prescribed steroids and analgesia which has not significantly improved.  Is fairly constant.  Has not moved.  Is worse with certain movements.  No urinary symptoms.  No fevers or chills.  No change in bowel habits.     Physical Exam   Triage Vital Signs: ED Triage Vitals  Encounter Vitals Group     BP 05/28/23 1747 (!) 155/93     Systolic BP Percentile --      Diastolic BP Percentile --      Pulse Rate 05/28/23 1747 71     Resp 05/28/23 1747 18     Temp 05/28/23 1747 98.7 F (37.1 C)     Temp Source 05/28/23 1747 Oral     SpO2 05/28/23 1747 96 %     Weight 05/28/23 1801 190 lb 3.2 oz (86.3 kg)     Height --      Head Circumference --      Peak Flow --      Pain Score 05/28/23 1801 7     Pain Loc --      Pain Education --      Exclude from Growth Chart --     Most recent vital signs: Vitals:   05/28/23 1747 05/28/23 2235  BP: (!) 155/93 (!) 141/80  Pulse: 71 63  Resp: 18 17  Temp: 98.7 F (37.1 C) 98 F (36.7 C)  SpO2: 96% 100%     General: Awake, no distress.  CV:  Good peripheral perfusion.  Regular rate and rhythm. Resp:  Normal work of breathing.  Lungs are clear to auscultation bilaterally. Abd:  No distention.  Mild tenderness to palpation of the left lower quadrant and left flank.  No rebound or guarding.  No skin lesions. Other:  Well-appearing.  Distal perfusion intact.   ED Results / Procedures / Treatments   Labs (all labs ordered are listed, but only abnormal results are  displayed) Labs Reviewed  COMPREHENSIVE METABOLIC PANEL - Abnormal; Notable for the following components:      Result Value   Glucose, Bld 148 (*)    Creatinine, Ser 1.39 (*)    GFR, Estimated 57 (*)    All other components within normal limits  CBC - Abnormal; Notable for the following components:   WBC 16.2 (*)    All other components within normal limits  URINALYSIS, ROUTINE W REFLEX MICROSCOPIC - Abnormal; Notable for the following components:   Color, Urine YELLOW (*)    APPearance CLEAR (*)    All other components within normal limits  LIPASE, BLOOD     EKG    RADIOLOGY Chest x-ray:clear CT renal stone:negative CT L-spine:no acute fx   I also independently reviewed and agree with radiologist interpretations.   PROCEDURES:  Critical Care performed: No   MEDICATIONS ORDERED IN ED: Medications  HYDROcodone-acetaminophen (NORCO/VICODIN) 5-325 MG per tablet 2 tablet (2 tablets Oral Given 05/28/23 2252)  ondansetron (ZOFRAN-ODT) disintegrating tablet 4 mg (  4 mg Oral Given 05/28/23 2252)  ketorolac (TORADOL) injection 60 mg (60 mg Intramuscular Given 05/28/23 2252)     IMPRESSION / MDM / ASSESSMENT AND PLAN / ED COURSE  I reviewed the triage vital signs and the nursing notes.                              Differential diagnosis includes, but is not limited to, nephrolithiasis, MSK flank pain, thoracic radiculopathy, diverticulitis, shingles  Patient's presentation is most consistent with acute presentation with potential threat to life or bodily function.  The patient is on the cardiac monitor to evaluate for evidence of arrhythmia and/or significant heart rate changes  62 yo M with PMHx RA here with left sided abd/flank pain. Suspect MSK pain, likely radicular pain from thoracic spine given distribution. I reviewed his prior MRI from 2020 and it did show degenerative disease at T11/T12 which would fit with his sx. The positional nature of it and persistence is c/w  this. Otherwise, CT A/P and L spine obtained, reviewed, and are unremarkable. No signs of stone, aneurysm, diverticulitis, or other etiology. His labs are very reassuring. Mild leukocytosis is likely 2/2 his prednisone use. CMP unremarkable. Lipase normal.   Will prolong his steroid course as I suspect his pain is MSK related to his RA, likely from thoracic radiculopathy, and give additional analgesia. Return precautions given.    FINAL CLINICAL IMPRESSION(S) / ED DIAGNOSES   Final diagnoses:  Flank pain  Radicular pain of thoracic region     Rx / DC Orders   ED Discharge Orders          Ordered    predniSONE (DELTASONE) 10 MG tablet  Daily        05/28/23 2251    HYDROcodone-acetaminophen (NORCO/VICODIN) 5-325 MG tablet  Every 6 hours PRN        05/28/23 2251    ondansetron (ZOFRAN-ODT) 4 MG disintegrating tablet  Every 8 hours PRN        05/28/23 2251    pantoprazole (PROTONIX) 40 MG tablet  Daily        05/28/23 2252             Note:  This document was prepared using Dragon voice recognition software and may include unintentional dictation errors.   Shaune Pollack, MD 05/29/23 [phone]

## 2023-05-28 NOTE — ED Notes (Signed)
Assumed care of pt at this time. Pt is AAXO4, pt denies ABD pain at this time but is worse at night. States no fevers at home and regular BM's. Pt accompanied by family member

## 2023-05-28 NOTE — Discharge Instructions (Addendum)
Take the higher dose of prednisone as prescribed.  Take the Protonix with the Prednisone to help protect your stomach.  Take the Norco for severe pain.  I'd recommend seeing your primary doctor for follow-up.  IF pain returns or doesn't go away, consider MRI/evaluation with Dr. Marcell Barlow

## 2023-06-03 ENCOUNTER — Encounter: Payer: Self-pay | Admitting: Neurosurgery

## 2023-06-03 ENCOUNTER — Ambulatory Visit: Payer: 59 | Admitting: Neurosurgery

## 2023-06-03 VITALS — BP 128/78 | Ht 70.0 in | Wt 190.0 lb

## 2023-06-03 DIAGNOSIS — M5414 Radiculopathy, thoracic region: Secondary | ICD-10-CM | POA: Diagnosis not present

## 2023-06-03 DIAGNOSIS — R109 Unspecified abdominal pain: Secondary | ICD-10-CM

## 2023-06-03 MED ORDER — GABAPENTIN 300 MG PO CAPS: 300.0000 mg | ORAL_CAPSULE | Freq: Every day | ORAL | 0 refills | Status: DC

## 2023-06-03 NOTE — Progress Notes (Unsigned)
Referring Physician:  Mliss Sax, MD [address] [place],  [place] [address]  Primary Physician:  Mliss Sax, MD  History of Present Illness: 06/03/2023 Benjamin Fields presents with a new onset of severe left-sided back pain of two weeks duration. The pain is described as worst in the morning upon waking and stretching, and gradually improves after breakfast and medication. However, the pain tends to return by late evening and is particularly severe by the next morning. The patient denies any associated leg pain or numbness.  The pain is localized to the left side near his last rib on the wide, with certain spots being particularly tender. The patient describes the sensation as similar to a bruise when pressed, and at times, experiences a burning sensation superficially on the skin. There have been no changes in skin appearance.  The patient was initially managed with naproxen, baclofen, and prednisone by their primary care provider. The prednisone provided some relief initially, but as the dose tapered, the pain began to return. The patient also tried Voltaren gel, which provided temporary relief for a couple of hours.  The patient has been on gabapentin for several years for neuropathy in the legs, which has been effective. They take one pill in the morning and four at night. The patient reports that if they forget to take the medication, they notice an increase in symptoms. The patient denies any balance issues or problems with walking, although they report difficulty moving due to the pain.  12/05/2022 Benjamin Fields continues to do better.  His balance is improved.  He has no grip weakness.  He is not complaining of any significant numbness currently.  10/08/2022 He is feeling somewhat better than he was when I last saw him.  08/06/2022 Benjamin Fields is here today with a chief complaint of left sided neck pain that radiates into the shoulder along with  weakness and numbness that radiates down into his fingers.  He has been having worsening symptoms over the past 2 months.  He reports sharp pain into his left shoulder blade and arm.  He has numbness and weakness in his left arm.  His pain is as bad as 10 out of 10 and made worse by flexion and extension of his neck or rotation of his neck.  Raising his arm helps a little bit.  He has had some trouble with buttoning buttons and opening jars.  He has had a fall, and feels slightly off balance.   Bowel/Bladder Dysfunction: none  Conservative measures:  Physical therapy: has participated in dry needling for the neck (3-4 sessions) at [facility] Multimodal medical therapy including regular antiinflammatories:  tramadol, cymbalta, gabapentin, aleve Injections: has not received any epidural steroid injections  Past Surgery:  C4-6 Fusion about 15 years ago By Dr. Terence Lux has symptoms of cervical myelopathy.  The symptoms are causing a significant impact on the patient's life.   I have utilized the care everywhere function in epic to review the outside records available from external health systems.  Review of Systems:  A 10 point review of systems is negative, except for the pertinent positives and negatives detailed in the HPI.  Past Medical History: Past Medical History:  Diagnosis Date   Allergy    mild- no meds    Anxiety    Hypertension    Neuromuscular disorder (HCC)    mild nerve issue right leg    RA (rheumatoid arthritis) (HCC)  Past Surgical History: Past Surgical History:  Procedure Laterality Date   CERVICAL SPINE SURGERY  2010   COLONOSCOPY     COLONOSCOPY  04/2020   TONSILLECTOMY      Allergies: Allergies as of 06/03/2023 - Review Complete 06/03/2023  Allergen Reaction Noted   Cortisone  03/26/2018    Medications: Current Meds  Medication Sig   diclofenac Sodium (VOLTAREN) 1 % GEL Apply a small grape sized dollop to  lateral epicondyles 3 times daily.   Elastic Bandages & Supports (TENNIS ELBOW STRAP) MISC As directed   gabapentin (NEURONTIN) 100 MG capsule Take 1 capsule in am, 1 capsule at lunch and 3 capsules at bedtime.   HYDROcodone-acetaminophen (NORCO/VICODIN) 5-325 MG tablet Take 1-2 tablets by mouth every 6 (six) hours as needed for moderate pain or severe pain (no more than 6 tabs daily).   losartan (COZAAR) 50 MG tablet Take 1 tablet by mouth once daily   Multiple Vitamin (MULTIVITAMIN) tablet Take 1 tablet by mouth daily. OTC   ondansetron (ZOFRAN-ODT) 4 MG disintegrating tablet Take 1 tablet (4 mg total) by mouth every 8 (eight) hours as needed.   pantoprazole (PROTONIX) 40 MG tablet Take 1 tablet (40 mg total) by mouth daily for 12 days.   predniSONE (DELTASONE) 10 MG tablet Take 6 tablets (60 mg total) by mouth daily for 2 days, THEN 4 tablets (40 mg total) daily for 3 days, THEN 2 tablets (20 mg total) daily for 3 days, THEN 1 tablet (10 mg total) daily for 3 days.   Current Facility-Administered Medications for the 06/03/23 encounter (Office Visit) with Venetia Night, MD  Medication   triamcinolone acetonide (KENALOG-40) injection 40 mg    Social History: Social History   Tobacco Use   Smoking status: Never   Smokeless tobacco: Never  Vaping Use   Vaping status: Never Used  Substance Use Topics   Alcohol use: Never   Drug use: Never    Family Medical History: Family History  Problem Relation Age of Onset   Parkinson's disease Mother    Atrial fibrillation Mother    Heart failure Father    Hypertension Father    Prostate cancer Father    Colon cancer Neg Hx    Colon polyps Neg Hx    Esophageal cancer Neg Hx    Rectal cancer Neg Hx    Stomach cancer Neg Hx     Physical Examination: Vitals:   06/03/23 1548  BP: 128/78   5 out of 5 strength in his bilateral upper extremities.  Sensation is intact to light touch.  His gait is normal.  +TTP around the inferior  aspect of his L 11th rib  Medical Decision Making  Imaging: MRI cervical spine on July 19, 2022 shows postsurgical change from C4-6 ACDF.  There is progressive adjacent segment disease at C6-7 with moderate size left paracentral medial foraminal disc extrusion causing cord flattening and moderate to severe left foraminal narrowing.  Probable left C7 nerve root encroachment.  Progressive spondylosis at C3-4 with resulting mild spinal stenosis and moderate foraminal narrowing bilaterally.  The minimal AP diameter of the spinal canal at C6-7 is 4 mm, representing severe stenosis.  CT scan of the cervical spine on October 01, 2022  IMPRESSION: 1. Anterior and interbody fusion changes at C4-5 and C5-6 without complicating features. 2. Advanced right-sided facet disease and uncinate spurring contributing to right foraminal stenosis at C3-4. 3. The disc protrusion seen on the recent MRI from November 2023 is not  as well demonstrated on the CT scan.     Electronically Signed   By: Rudie Meyer M.D.   On: 10/03/2022 08:42   Please note that there is incomplete fusion at the C5-6 level.  I have personally reviewed the images and agree with the above interpretation.  Assessment and Plan: Mr. Blanchfield is a pleasant 62 y.o. male with possible thoracic radiculopathy in the lower thoracic spine.  He also may have some musculoskeletal component or early stage shingles.  He has had the shingles vaccine, so that is less likely.  I would like to increase his gabapentin for now.  He will start NSAIDs after his prednisone taper ends.  Will start some physical therapy.  I will see him back via telephone visit in approximately 4 weeks to determine the need for imaging.     I spent a total of 10 minutes in this patient's care today. This time was spent reviewing pertinent records including imaging studies, obtaining and confirming history, performing a directed evaluation, formulating and discussing my  recommendations, and documenting the visit within the medical record.       Elmor Kost K. Myer Haff MD, [facility]

## 2023-06-06 ENCOUNTER — Encounter: Payer: 59 | Admitting: Nurse Practitioner

## 2023-06-08 ENCOUNTER — Encounter: Payer: Self-pay | Admitting: Neurosurgery

## 2023-06-24 ENCOUNTER — Telehealth: Payer: 59 | Admitting: Neurosurgery

## 2023-07-17 ENCOUNTER — Ambulatory Visit: Payer: 59 | Admitting: Nurse Practitioner

## 2023-07-17 ENCOUNTER — Encounter: Payer: Self-pay | Admitting: Nurse Practitioner

## 2023-07-17 VITALS — BP 116/80 | HR 86 | Temp 98.0°F | Ht 70.0 in | Wt 192.6 lb

## 2023-07-17 DIAGNOSIS — I10 Essential (primary) hypertension: Secondary | ICD-10-CM

## 2023-07-17 DIAGNOSIS — F418 Other specified anxiety disorders: Secondary | ICD-10-CM

## 2023-07-17 DIAGNOSIS — Z23 Encounter for immunization: Secondary | ICD-10-CM

## 2023-07-17 DIAGNOSIS — Z981 Arthrodesis status: Secondary | ICD-10-CM

## 2023-07-17 DIAGNOSIS — M5030 Other cervical disc degeneration, unspecified cervical region: Secondary | ICD-10-CM | POA: Diagnosis not present

## 2023-07-17 DIAGNOSIS — M0609 Rheumatoid arthritis without rheumatoid factor, multiple sites: Secondary | ICD-10-CM | POA: Diagnosis not present

## 2023-07-17 NOTE — Assessment & Plan Note (Signed)
Patient currently maintained on losartan 50 mg daily.  Blood pressure well-controlled.  Continue medication as prescribed

## 2023-07-17 NOTE — Progress Notes (Signed)
New Patient Office Visit  Subjective    Patient ID: Benjamin Fields, male    DOB: 09/28/1960  Age: 62 y.o. MRN: [id number]  CC:  Chief Complaint  Patient presents with   Transitions Of Care    HPI [name] presents to establish care   HTN: losartan. States that he does check it while he is out. Does not have a cuff at home  RA: was dx with it and was taken meds that were ineffective. States that he went to [facility] for a second opinion. Was told that he had psoriacic arthiritis. Has been off meds for approx 1 year and feeling  DDD: Dr Myer Haff hx of cervical spine surgery. States that he will take gabapentin 100 QAM and 700 QPM  Tdap: 2021 Flu: update today  Covid: roginal  Shingrix: shigrinx Pna: Within the past 20 years  Colonoscopy: 0821/21 recall 5 years      07/17/2023    1:58 PM 05/26/2023    9:19 AM 01/17/2023    3:45 PM  PHQ9 SCORE ONLY  PHQ-9 Total Score 9 0 0       07/17/2023    1:58 PM 10/18/2021    1:27 PM 03/14/2021    8:38 AM 12/22/2020    3:52 PM  GAD 7 : Generalized Anxiety Score  Nervous, Anxious, on Edge 1 1 2 2   Control/stop worrying 1 1 2 3   Worry too much - different things 1 1 2 2   Trouble relaxing 1 1 3 1   Restless 1 1 3 1   Easily annoyed or irritable 1 1  3   Afraid - awful might happen 1 0 1 2  Total GAD 7 Score 7 6  14   Anxiety Difficulty Somewhat difficult Somewhat difficult Somewhat difficult Somewhat difficult       Outpatient Encounter Medications as of 07/17/2023  Medication Sig   diclofenac Sodium (VOLTAREN) 1 % GEL Apply a small grape sized dollop to lateral epicondyles 3 times daily.   gabapentin (NEURONTIN) 100 MG capsule Take 1 capsule in am, 1 capsule at lunch and 3 capsules at bedtime.   gabapentin (NEURONTIN) 300 MG capsule Take 1 capsule (300 mg total) by mouth at bedtime.   losartan (COZAAR) 50 MG tablet Take 1 tablet by mouth once daily   Multiple Vitamin (MULTIVITAMIN) tablet Take 1 tablet by mouth daily. OTC    Elastic Bandages & Supports (TENNIS ELBOW STRAP) MISC As directed (Patient not taking: Reported on 07/17/2023)   pantoprazole (PROTONIX) 40 MG tablet Take 1 tablet (40 mg total) by mouth daily for 12 days.   [DISCONTINUED] HYDROcodone-acetaminophen (NORCO/VICODIN) 5-325 MG tablet Take 1-2 tablets by mouth every 6 (six) hours as needed for moderate pain or severe pain (no more than 6 tabs daily).   [DISCONTINUED] ondansetron (ZOFRAN-ODT) 4 MG disintegrating tablet Take 1 tablet (4 mg total) by mouth every 8 (eight) hours as needed.   Facility-Administered Encounter Medications as of 07/17/2023  Medication   triamcinolone acetonide (KENALOG-40) injection 40 mg    Past Medical History:  Diagnosis Date   Allergy    mild- no meds    Anxiety    Hypertension    Neuromuscular disorder (HCC)    mild nerve issue right leg    RA (rheumatoid arthritis) (HCC)     Past Surgical History:  Procedure Laterality Date   CERVICAL SPINE SURGERY  2010   COLONOSCOPY     COLONOSCOPY  04/2020   ROTATOR CUFF REPAIR Right  TONSILLECTOMY      Family History  Problem Relation Age of Onset   Parkinson's disease Mother    Atrial fibrillation Mother    Heart failure Father    Hypertension Father    Prostate cancer Father 30   Colon cancer Neg Hx    Colon polyps Neg Hx    Esophageal cancer Neg Hx    Rectal cancer Neg Hx    Stomach cancer Neg Hx     Social History   Socioeconomic History   Marital status: Married    Spouse name: Administrator, arts   Number of children: Not on file   Years of education: Not on file   Highest education level: Master's degree (e.g., MA, MS, MEng, MEd, MSW, MBA)  Occupational History   Not on file  Tobacco Use   Smoking status: Never   Smokeless tobacco: Never  Vaping Use   Vaping status: Never Used  Substance and Sexual Activity   Alcohol use: Yes    Comment: rare wine during the holiday   Drug use: Never   Sexual activity: Yes  Other Topics Concern   Not on  file  Social History Narrative   Lives home with wife.  workd at [employer]  Education: Masters.  Children 2.  Caffeine 2 cups daily      Gabreial (28)   Grant (30)      Works at [employer] work    Social Determinants of [employer]: Low Risk  (01/17/2023)   Overall Financial Resource Strain (CARDIA)    Difficulty of Paying Living Expenses: Not hard at all  Food Insecurity: No Food Insecurity (01/17/2023)   Hunger Vital Sign    Worried About Running Out of Food in the Last Year: Never true    Ran Out of Food in the Last Year: Never true  Transportation Needs: No Transportation Needs (01/17/2023)   PRAPARE - Administrator, Civil Service (Medical): No    Lack of Transportation (Non-Medical): No  Physical Activity: Insufficiently Active (01/17/2023)   Exercise Vital Sign    Days of Exercise per Week: 1 day    Minutes of Exercise per Session: 120 min  Stress: Stress Concern Present (01/17/2023)   [employer] of Occupational Health - Occupational Stress Questionnaire    Feeling of Stress : To some extent  Social Connections: Moderately Integrated (01/17/2023)   Social Connection and Isolation Panel [NHANES]    Frequency of Communication with Friends and Family: Never    Frequency of Social Gatherings with Friends and Family: Never    Attends Religious Services: More than 4 times per year    Active Member of [employer] or Organizations: Yes    Attends Engineer, structural: More than 4 times per year    Marital Status: Married  Catering manager Violence: Not on file    Review of Systems  Constitutional:  Negative for chills and fever.  Respiratory:  Negative for shortness of breath.   Cardiovascular:  Negative for chest pain and leg swelling.  Gastrointestinal:  Negative for abdominal pain, blood in stool, constipation, diarrhea, nausea and vomiting.       BM daily   Genitourinary:  Negative for dysuria and hematuria.  Neurological:   Negative for tingling and headaches.  Psychiatric/Behavioral:  Negative for hallucinations and suicidal ideas.         Objective    BP 116/80   Pulse 86   Temp 98 F (36.7 C) (Oral)  Ht 5\' 10"  (1.778 m)   Wt 192 lb 9.6 oz (87.4 kg)   SpO2 93%   BMI 27.64 kg/m   Physical Exam Vitals and nursing note reviewed.  Constitutional:      Appearance: Normal appearance.  HENT:     Right Ear: Tympanic membrane, ear canal and external ear normal.     Left Ear: Tympanic membrane, ear canal and external ear normal.     Mouth/Throat:     Mouth: Mucous membranes are moist.     Pharynx: Oropharynx is clear.  Eyes:     Extraocular Movements: Extraocular movements intact.     Pupils: Pupils are equal, round, and reactive to light.  Cardiovascular:     Rate and Rhythm: Normal rate and regular rhythm.     Pulses: Normal pulses.     Heart sounds: Normal heart sounds.  Pulmonary:     Effort: Pulmonary effort is normal.     Breath sounds: Normal breath sounds.  Abdominal:     General: Bowel sounds are normal.  Musculoskeletal:     Right lower leg: No edema.     Left lower leg: No edema.  Lymphadenopathy:     Cervical: No cervical adenopathy.  Skin:    General: Skin is warm.  Neurological:     General: No focal deficit present.     Mental Status: He is alert.     Deep Tendon Reflexes:     Reflex Scores:      Bicep reflexes are 2+ on the right side and 2+ on the left side.      Patellar reflexes are 2+ on the right side and 2+ on the left side.    Comments: Bilateral upper and lower extremity strength 5/5  Psychiatric:        Mood and Affect: Mood normal.        Behavior: Behavior normal.        Thought Content: Thought content normal.        Judgment: Judgment normal.         Assessment & Plan:   Problem List Items Addressed This Visit       Cardiovascular and Mediastinum   Essential hypertension - Primary    Patient currently maintained on losartan 50 mg daily.   Blood pressure well-controlled.  Continue medication as prescribed        Musculoskeletal and Integument   DDD (degenerative disc disease), cervical    History of the same.  Patient has status post cervical fusion currently maintained on gabapentin followed by neurosurgery Dr. Volanda Napoleon.  Continue medication as prescribed follow-up with neurosurgery as recommended      Rheumatoid arthritis of multiple sites with negative rheumatoid factor (HCC)    Historical diagnosis.  Patient has been evaluated by rheumatology had a second opinion at [facility] and was diagnosed with psoriatic arthritis.  Patient was placed on medications that juncture had a period time where he had a lack of insurance and stopped medications.  Currently not on any medications and feeling okay.        Other   Depression with anxiety    Historical diagnosis.  Patient not currently on any antidepressants.  Patient denies HI/SI/AVH.      History of fusion of cervical spine   Other Visit Diagnoses     Need for influenza vaccination       Relevant Orders   Flu vaccine trivalent PF, 6mos and older(Flulaval,Afluria,Fluarix,Fluzone) (Completed)       Return  in about 6 months (around 01/14/2024) for CPE and Labs.   Audria Nine, NP

## 2023-07-17 NOTE — Patient Instructions (Signed)
Nice to see you today We did update your flu vaccine today Follow up with me in 6 months for you physical and fasting labs, sooner if you need me

## 2023-07-17 NOTE — Assessment & Plan Note (Signed)
Historical diagnosis.  Patient not currently on any antidepressants.  Patient denies HI/SI/AVH.

## 2023-07-17 NOTE — Assessment & Plan Note (Signed)
Historical diagnosis.  Patient has been evaluated by rheumatology had a second opinion at [facility] and was diagnosed with psoriatic arthritis.  Patient was placed on medications that juncture had a period time where he had a lack of insurance and stopped medications.  Currently not on any medications and feeling okay.

## 2023-07-17 NOTE — Assessment & Plan Note (Signed)
History of the same.  Patient has status post cervical fusion currently maintained on gabapentin followed by neurosurgery Dr. Volanda Napoleon.  Continue medication as prescribed follow-up with neurosurgery as recommended

## 2023-08-04 ENCOUNTER — Encounter: Payer: Self-pay | Admitting: Neurosurgery

## 2023-08-04 ENCOUNTER — Encounter: Payer: Self-pay | Admitting: Nurse Practitioner

## 2023-08-04 DIAGNOSIS — I10 Essential (primary) hypertension: Secondary | ICD-10-CM

## 2023-08-04 DIAGNOSIS — G629 Polyneuropathy, unspecified: Secondary | ICD-10-CM

## 2023-08-05 ENCOUNTER — Other Ambulatory Visit: Payer: Self-pay | Admitting: Family Medicine

## 2023-08-05 MED ORDER — GABAPENTIN 100 MG PO CAPS
ORAL_CAPSULE | ORAL | 1 refills | Status: DC
Start: 2023-08-05 — End: 2024-01-14

## 2023-08-05 MED ORDER — GABAPENTIN 300 MG PO CAPS: 300.0000 mg | ORAL_CAPSULE | Freq: Every day | ORAL | 0 refills | Status: DC

## 2023-08-05 MED ORDER — LOSARTAN POTASSIUM 50 MG PO TABS: 50.0000 mg | ORAL_TABLET | Freq: Every day | ORAL | 2 refills | Status: DC

## 2023-08-08 MED ORDER — LOSARTAN POTASSIUM 25 MG PO TABS: 25.0000 mg | ORAL_TABLET | Freq: Every day | ORAL | Status: DC

## 2023-08-27 ENCOUNTER — Encounter: Payer: Self-pay | Admitting: Nurse Practitioner

## 2023-09-15 ENCOUNTER — Encounter: Payer: Self-pay | Admitting: Nurse Practitioner

## 2023-09-15 DIAGNOSIS — G629 Polyneuropathy, unspecified: Secondary | ICD-10-CM

## 2023-09-15 DIAGNOSIS — F418 Other specified anxiety disorders: Secondary | ICD-10-CM

## 2023-09-17 MED ORDER — DULOXETINE HCL 30 MG PO CPEP: 30.0000 mg | ORAL_CAPSULE | Freq: Every day | ORAL | 1 refills | Status: DC

## 2023-09-17 NOTE — Telephone Encounter (Signed)
Can we make sure he gets scheduled for a virtual or in person visit for 6 weeks

## 2023-09-18 NOTE — Telephone Encounter (Signed)
lvm for pt to call office to schedule appt.  

## 2023-09-23 NOTE — Telephone Encounter (Signed)
Left voicemail for patient to call the office back.   Sent [employer].

## 2023-10-07 ENCOUNTER — Encounter: Payer: Self-pay | Admitting: Neurosurgery

## 2023-10-07 DIAGNOSIS — G629 Polyneuropathy, unspecified: Secondary | ICD-10-CM

## 2023-10-08 NOTE — Telephone Encounter (Signed)
Discussed with Drake Leach PA. I left voicemail. I told patient to return our call in the morning.

## 2023-10-09 NOTE — Telephone Encounter (Signed)
Please try to call him back to verify dose 100mg  in am 100mg  at lunch 600mg  at night.   I'm assuming he still has 100mg  tabs, does he need [place] to call in a 600mg  tab to take at night?

## 2023-10-09 NOTE — Telephone Encounter (Signed)
Attempted to call patient back. Left voicemail again to verify the doseage. Will also send a mychart message to confirm if he needs 600mg  at night or not.

## 2023-10-10 MED ORDER — GABAPENTIN 600 MG PO TABS: 600.0000 mg | ORAL_TABLET | Freq: Every day | ORAL | 0 refills | Status: DC

## 2023-10-10 NOTE — Addendum Note (Signed)
Addended byDrake Leach on: 10/10/2023 08:21 AM   Modules accepted: Orders

## 2023-10-10 NOTE — Telephone Encounter (Signed)
See message to patient. He is to continue with gabapentin 100mg  in am and 100mg  at lunch. He will take 600mg  q hs.   He will get further refills from PCP. He was seen in October and has no follow up.

## 2023-10-23 ENCOUNTER — Telehealth: Payer: Self-pay

## 2023-10-23 DIAGNOSIS — G629 Polyneuropathy, unspecified: Secondary | ICD-10-CM

## 2023-10-23 DIAGNOSIS — F418 Other specified anxiety disorders: Secondary | ICD-10-CM

## 2023-10-23 MED ORDER — DULOXETINE HCL 30 MG PO CPEP
30.0000 mg | ORAL_CAPSULE | Freq: Every day | ORAL | 1 refills | Status: DC
Start: 2023-10-23 — End: 2023-10-29

## 2023-10-23 NOTE — Addendum Note (Signed)
 Addended by: Eden Emms on: 10/23/2023 02:06 PM   Modules accepted: Orders

## 2023-10-23 NOTE — Telephone Encounter (Signed)
 LAST APPOINTMENT DATE: 07/17/2023 for hypertension   NEXT APPOINTMENT DATE: 10/29/2023 follow up for med refills  Duloxetine 30 MG  LAST REFILL: 09/17/23  QTY: #30 1RF

## 2023-10-27 DIAGNOSIS — L299 Pruritus, unspecified: Secondary | ICD-10-CM

## 2023-10-27 HISTORY — DX: Pruritus, unspecified: L29.9

## 2023-10-29 ENCOUNTER — Telehealth: Payer: 59 | Admitting: Nurse Practitioner

## 2023-10-29 ENCOUNTER — Encounter: Payer: Self-pay | Admitting: Nurse Practitioner

## 2023-10-29 ENCOUNTER — Telehealth: Payer: Self-pay | Admitting: Nurse Practitioner

## 2023-10-29 ENCOUNTER — Other Ambulatory Visit: Payer: Self-pay | Admitting: Nurse Practitioner

## 2023-10-29 DIAGNOSIS — L299 Pruritus, unspecified: Secondary | ICD-10-CM

## 2023-10-29 DIAGNOSIS — G629 Polyneuropathy, unspecified: Secondary | ICD-10-CM

## 2023-10-29 DIAGNOSIS — F418 Other specified anxiety disorders: Secondary | ICD-10-CM

## 2023-10-29 HISTORY — DX: Polyneuropathy, unspecified: G62.9

## 2023-10-29 MED ORDER — DULOXETINE HCL 30 MG PO CPEP
30.0000 mg | ORAL_CAPSULE | Freq: Every day | ORAL | 2 refills | Status: DC
Start: 2023-10-29 — End: 2023-11-17

## 2023-10-29 MED ORDER — LEVOCETIRIZINE DIHYDROCHLORIDE 5 MG PO TABS: 5.0000 mg | ORAL_TABLET | Freq: Every evening | ORAL | 3 refills | Status: DC

## 2023-10-29 NOTE — Assessment & Plan Note (Signed)
 Currently maintained on duloxetine 30 mg daily.  Patient denies HI/SI/AVH.  PHQ-9 and GAD-7 manage in office.  Patient states he feels good worried that on dosing.  Continue duloxetine 30 mg daily

## 2023-10-29 NOTE — Assessment & Plan Note (Signed)
 Currently maintained on duloxetine 30 mg daily without relief.  Continue

## 2023-10-29 NOTE — Progress Notes (Signed)
 Ph: [phone] Fax: [phone]   Patient ID: Benjamin Fields, male    DOB: 10-02-1960, 63 y.o.   MRN: [id number]  Virtual visit completed through MyChart, a video enabled telemedicine application. Due to national recommendations of social distancing due to COVID-19, a virtual visit is felt to be most appropriate for this patient at this time. Reviewed limitations, risks, security and privacy concerns of performing a virtual visit and the availability of in person appointments. I also reviewed that there may be a patient responsible charge related to this service. The patient agreed to proceed.   Patient location: home Provider location:  at [facility], office Persons participating in this virtual visit: patient, provider   If any vitals were documented, they were collected by patient at home unless specified below.    There were no vitals taken for this visit.   CC: Anxiety and depression  Subjective:   HPI: Benjamin Fields is a 63 y.o. male presenting on 10/29/2023 for Medical Management of Chronic Issues (6 week f/u)    Patient had reached out and was asking to be put back on duloxeitne. He was on it in the past and had taken a break from it. He was on 60mg  in the past but was started back on 30mg . He has stated and is doing well with it States that he was experiening some anxiety. He feels like he is ok on the 30mg   States that he is sleeping well  Itching: states that he is having a lot of itching and breaking out. States that he has psoraic arthritis. He states that it does happen almost every year with the colder weather. He is itching globally and not just in one area. States that he did take an over the counter antihistamine that seemed to help      10/29/2023    8:10 AM 07/17/2023    1:58 PM 05/26/2023    9:19 AM  PHQ9 SCORE ONLY  PHQ-9 Total Score 1 9 0      10/29/2023    8:11 AM 07/17/2023    1:58 PM 10/18/2021    1:27 PM 03/14/2021    8:38 AM  GAD 7  : Generalized Anxiety Score  Nervous, Anxious, on Edge 0 1 1 2   Control/stop worrying 0 1 1 2   Worry too much - different things 1 1 1 2   Trouble relaxing 0 1 1 3   Restless 0 1 1 3   Easily annoyed or irritable 0 1 1   Afraid - awful might happen 0 1 0 1  Total GAD 7 Score 1 7 6    Anxiety Difficulty Not difficult at all Somewhat difficult Somewhat difficult Somewhat difficult        Relevant past medical, surgical, family and social history reviewed and updated as indicated. Interim medical history since our last visit reviewed. Allergies and medications reviewed and updated. Outpatient Medications Prior to Visit  Medication Sig Dispense Refill   diclofenac Sodium (VOLTAREN) 1 % GEL Apply a small grape sized dollop to lateral epicondyles 3 times daily. 150 g 1   Elastic Bandages & Supports (TENNIS ELBOW STRAP) MISC As directed 1 each 2   gabapentin (NEURONTIN) 100 MG capsule Take 1 capsule in am, 1 capsule at lunch and 3 capsules at bedtime. 450 capsule 1   gabapentin (NEURONTIN) 600 MG tablet Take 1 tablet (600 mg total) by mouth at bedtime. Continue to take the gabapentin 100mg  that you have at home. Take 1 in  the morning and 1 at lunch. 30 tablet 0   losartan (COZAAR) 25 MG tablet Take 1 tablet (25 mg total) by mouth daily.     Multiple Vitamin (MULTIVITAMIN) tablet Take 1 tablet by mouth daily. OTC     DULoxetine (CYMBALTA) 30 MG capsule Take 1 capsule (30 mg total) by mouth daily. 30 capsule 1   pantoprazole (PROTONIX) 40 MG tablet Take 1 tablet (40 mg total) by mouth daily for 12 days. 12 tablet 0   Facility-Administered Medications Prior to Visit  Medication Dose Route Frequency Provider Last Rate Last Admin   triamcinolone acetonide (KENALOG-40) injection 40 mg  40 mg Intra-articular Once Mliss Sax, MD         Per HPI unless specifically indicated in ROS section below Review of Systems  Constitutional:  Negative for chills and fever.  Psychiatric/Behavioral:   Negative for hallucinations, sleep disturbance and suicidal ideas.    Objective:  There were no vitals taken for this visit.  Wt Readings from Last 3 Encounters:  07/17/23 192 lb 9.6 oz (87.4 kg)  06/03/23 190 lb (86.2 kg)  05/28/23 190 lb 3.2 oz (86.3 kg)       Physical exam: Gen: alert, NAD, not ill appearing Pulm: speaks in complete sentences without increased work of breathing Psych: normal mood, normal thought content      Results for orders placed or performed during the hospital encounter of 05/28/23  Lipase, blood   Collection Time: 05/28/23  6:02 PM  Result Value Ref Range   Lipase 26 11 - 51 U/L  Comprehensive metabolic panel   Collection Time: 05/28/23  6:02 PM  Result Value Ref Range   Sodium 137 135 - 145 mmol/L   Potassium 4.5 3.5 - 5.1 mmol/L   Chloride 106 98 - 111 mmol/L   CO2 24 22 - 32 mmol/L   Glucose, Bld 148 (H) 70 - 99 mg/dL   BUN 20 8 - 23 mg/dL   Creatinine, Ser 1.61 (H) 0.61 - 1.24 mg/dL   Calcium 9.2 8.9 - 09.6 mg/dL   Total Protein 7.3 6.5 - 8.1 g/dL   Albumin 4.5 3.5 - 5.0 g/dL   AST 20 15 - 41 U/L   ALT 29 0 - 44 U/L   Alkaline Phosphatase 54 38 - 126 U/L   Total Bilirubin 0.8 0.3 - 1.2 mg/dL   GFR, Estimated 57 (L) >60 mL/min   Anion gap 7 5 - 15  CBC   Collection Time: 05/28/23  6:02 PM  Result Value Ref Range   WBC 16.2 (H) 4.0 - 10.5 K/uL   RBC 5.09 4.22 - 5.81 MIL/uL   Hemoglobin 15.8 13.0 - 17.0 g/dL   HCT 04.5 40.9 - 81.1 %   MCV 91.0 80.0 - 100.0 fL   MCH 31.0 26.0 - 34.0 pg   MCHC 34.1 30.0 - 36.0 g/dL   RDW 91.4 78.2 - 95.6 %   Platelets 228 150 - 400 K/uL   nRBC 0.0 0.0 - 0.2 %  Urinalysis, Routine w reflex microscopic -Urine, Clean Catch   Collection Time: 05/28/23  6:02 PM  Result Value Ref Range   Color, Urine YELLOW (A) YELLOW   APPearance CLEAR (A) CLEAR   Specific Gravity, Urine 1.025 1.005 - 1.030   pH 5.0 5.0 - 8.0   Glucose, UA NEGATIVE NEGATIVE mg/dL   Hgb urine dipstick NEGATIVE NEGATIVE   Bilirubin  Urine NEGATIVE NEGATIVE   Ketones, ur NEGATIVE NEGATIVE mg/dL   Protein,  ur NEGATIVE NEGATIVE mg/dL   Nitrite NEGATIVE NEGATIVE   Leukocytes,Ua NEGATIVE NEGATIVE   Assessment & Plan:   Pruritus Assessment & Plan: Multifactorial.  Did inform patient to use good hydrating moisturizer along with taking tepid baths/showers.  Also pat dry versus scrub dry when toweling off.  Will send levocetirizine 5 mg daily.   Depression with anxiety Assessment & Plan: Currently maintained on duloxetine 30 mg daily.  Patient denies HI/SI/AVH.  PHQ-9 and GAD-7 manage in office.  Patient states he feels good worried that on dosing.  Continue duloxetine 30 mg daily  Orders: -     DULoxetine HCl; Take 1 capsule (30 mg total) by mouth daily.  Dispense: 90 capsule; Refill: 2  Neuropathy Assessment & Plan: Currently maintained on duloxetine 30 mg daily without relief.  Continue  Orders: -     DULoxetine HCl; Take 1 capsule (30 mg total) by mouth daily.  Dispense: 90 capsule; Refill: 2  Other orders -     Levocetirizine Dihydrochloride; Take 1 tablet (5 mg total) by mouth every evening.  Dispense: 90 tablet; Refill: 3     I discussed the assessment and treatment plan with the patient. The patient was provided an opportunity to ask questions and all were answered. The patient agreed with the plan and demonstrated an understanding of the instructions. The patient was advised to call back or seek an in-person evaluation if the symptoms worsen or if the condition fails to improve as anticipated.  Follow up plan: Return in about 3 months (around 01/29/2024) for CPE and Labs.  Audria Nine, NP

## 2023-10-29 NOTE — Telephone Encounter (Signed)
 Called  pt and schedule a appt for cpe

## 2023-10-29 NOTE — Assessment & Plan Note (Signed)
 Multifactorial.  Did inform patient to use good hydrating moisturizer along with taking tepid baths/showers.  Also pat dry versus scrub dry when toweling off.  Will send levocetirizine 5 mg daily.

## 2023-10-29 NOTE — Patient Instructions (Addendum)
 Nice to see you today. I have sent in an allergy pill for you to try. Follow-up with me in 3 months for your physical and labs.  Sooner if you need

## 2023-10-29 NOTE — Telephone Encounter (Signed)
 Can we get the patient schedule for a CPE in 3 months  please

## 2023-10-30 ENCOUNTER — Other Ambulatory Visit: Payer: Self-pay | Admitting: Nurse Practitioner

## 2023-11-09 ENCOUNTER — Other Ambulatory Visit: Payer: Self-pay | Admitting: Orthopedic Surgery

## 2023-11-10 NOTE — Telephone Encounter (Signed)
 Per his last refill in February, he is to get further refills of neurontin from his PCP. He was last seen in October and has no follow up.   Please let him know. Refill sent back denied.

## 2023-11-10 NOTE — Telephone Encounter (Signed)
 Left message for the patient to call the office.

## 2023-11-17 ENCOUNTER — Other Ambulatory Visit: Payer: Self-pay | Admitting: Nurse Practitioner

## 2023-11-17 DIAGNOSIS — F418 Other specified anxiety disorders: Secondary | ICD-10-CM

## 2023-11-17 DIAGNOSIS — G629 Polyneuropathy, unspecified: Secondary | ICD-10-CM

## 2024-01-14 ENCOUNTER — Ambulatory Visit (INDEPENDENT_AMBULATORY_CARE_PROVIDER_SITE_OTHER): Admitting: Nurse Practitioner

## 2024-01-14 ENCOUNTER — Encounter: Payer: Self-pay | Admitting: Nurse Practitioner

## 2024-01-14 VITALS — BP 128/80 | HR 71 | Temp 98.0°F | Ht 69.75 in | Wt 189.4 lb

## 2024-01-14 DIAGNOSIS — E782 Mixed hyperlipidemia: Secondary | ICD-10-CM

## 2024-01-14 DIAGNOSIS — Z125 Encounter for screening for malignant neoplasm of prostate: Secondary | ICD-10-CM | POA: Diagnosis not present

## 2024-01-14 DIAGNOSIS — F418 Other specified anxiety disorders: Secondary | ICD-10-CM | POA: Diagnosis not present

## 2024-01-14 DIAGNOSIS — M0609 Rheumatoid arthritis without rheumatoid factor, multiple sites: Secondary | ICD-10-CM

## 2024-01-14 DIAGNOSIS — Z131 Encounter for screening for diabetes mellitus: Secondary | ICD-10-CM | POA: Diagnosis not present

## 2024-01-14 DIAGNOSIS — I10 Essential (primary) hypertension: Secondary | ICD-10-CM | POA: Diagnosis not present

## 2024-01-14 DIAGNOSIS — Z0000 Encounter for general adult medical examination without abnormal findings: Secondary | ICD-10-CM

## 2024-01-14 DIAGNOSIS — R1013 Epigastric pain: Secondary | ICD-10-CM | POA: Insufficient documentation

## 2024-01-14 DIAGNOSIS — G629 Polyneuropathy, unspecified: Secondary | ICD-10-CM | POA: Diagnosis not present

## 2024-01-14 MED ORDER — PANTOPRAZOLE SODIUM 20 MG PO TBEC
20.0000 mg | DELAYED_RELEASE_TABLET | Freq: Every day | ORAL | 0 refills | Status: DC
Start: 2024-01-14 — End: 2024-04-13

## 2024-01-14 NOTE — Assessment & Plan Note (Signed)
 Patient currently maintained on 100 mg gabapentin  every morning and 400 mg gabapentin  every afternoon.  He is followed by neurosurgery.  Continue taking medication as prescribed

## 2024-01-14 NOTE — Assessment & Plan Note (Signed)
 Previously on Protonix  40 mg daily.  Has been off for extended period time we will reinitiate Protonix  20 mg daily for 30 days.  Pending lab work today

## 2024-01-14 NOTE — Assessment & Plan Note (Signed)
 Patient currently maintained on losartan  25 mg daily.  Blood pressure well-controlled.  He can check blood pressure at home and did not like.  Continue medication as prescribed

## 2024-01-14 NOTE — Assessment & Plan Note (Signed)
 Patient currently maintained on duloxetine  30 mg daily.  He denies HI/SI/AVH.  Continue taking medication as prescribed

## 2024-01-14 NOTE — Assessment & Plan Note (Signed)
 History of the same was followed by rheumatology in the past.  Patient's last medication was Cosentyx .  He is having "flareup".  Will check RF factor along with CRP and ESR.  He does have a history of negative RF factor in the past.  Consider increasing duloxetine  to 60 mg daily or re refer patient to rheumatology

## 2024-01-14 NOTE — Assessment & Plan Note (Signed)
 History of the same pending lipid panel today

## 2024-01-14 NOTE — Progress Notes (Signed)
 Established Patient Office Visit  Subjective   Patient ID: Benjamin Fields, male    DOB: Jan 04, 1961  Age: 63 y.o. MRN: [id number]  Chief Complaint  Patient presents with   Annual Exam    HPI  HTN: currently maintained on losartan  25mg  daily. Does chec at home on occasion    GERD: on Protonix  40mg  dailly. He has been off it for a while. Not changing diet  Neuropathy: currently on gabapentin  600mg . He is currently doing 100mg  am and 400 in the evning   Depression with anxiety: currently maintained on duloxetine  30mg  daily. States that as of late he has been having some sympotms of the arthritis.  He is having joint pain and swollen joints.   RA: states that he did try cosyntx in the past (2023). He was followed by rheumotolgy through [facility]  for complete physical and follow up of chronic conditions.  Immunizations: -Tetanus: Completed in 2021 -Influenza: Out of season -Shingles: Completed Shingrix series -Pneumonia: Completed   Diet: Fair diet. He is eating 2 sometimes a third meal. He is snacking some. He is drinking 2-3 coffees and water  Exercise: No regular exercise.he does walk the dog often and employment   Eye exam: Completes annually. Wears glasses  Dental exam: Completes semi-annually    Colonoscopy: Completed in 2021 with 5 year recall  Lung Cancer Screening: N/A  PSA: Due  Sleep: he will go to bed around 10 and get up around 5-530. He will get 7 hours      Review of Systems  Constitutional:  Negative for chills and fever.  Respiratory:  Negative for shortness of breath.   Cardiovascular:  Negative for chest pain and leg swelling.  Gastrointestinal:  Negative for abdominal pain, blood in stool, constipation, diarrhea, nausea and vomiting.       Bm daily   Genitourinary:  Negative for dysuria and hematuria.  Musculoskeletal:  Positive for joint pain.  Neurological:  Positive for tingling. Negative for headaches.  Psychiatric/Behavioral:  Negative for  hallucinations and suicidal ideas.       Objective:     BP 128/80   Pulse 71   Temp 98 F (36.7 C) (Oral)   Ht 5' 9.75" (1.772 m)   Wt 189 lb 6.4 oz (85.9 kg)   SpO2 93%   BMI 27.37 kg/m  BP Readings from Last 3 Encounters:  01/14/24 128/80  07/17/23 116/80  06/03/23 128/78   Wt Readings from Last 3 Encounters:  01/14/24 189 lb 6.4 oz (85.9 kg)  07/17/23 192 lb 9.6 oz (87.4 kg)  06/03/23 190 lb (86.2 kg)   SpO2 Readings from Last 3 Encounters:  01/14/24 93%  07/17/23 93%  05/28/23 100%      Physical Exam Vitals and nursing note reviewed.  Constitutional:      Appearance: Normal appearance.  HENT:     Right Ear: Tympanic membrane, ear canal and external ear normal.     Left Ear: Tympanic membrane, ear canal and external ear normal.     Mouth/Throat:     Mouth: Mucous membranes are moist.     Pharynx: Oropharynx is clear.  Eyes:     Extraocular Movements: Extraocular movements intact.     Pupils: Pupils are equal, round, and reactive to light.  Cardiovascular:     Rate and Rhythm: Normal rate and regular rhythm.     Pulses: Normal pulses.     Heart sounds: Normal heart sounds.  Pulmonary:     Effort: Pulmonary  effort is normal.     Breath sounds: Normal breath sounds.  Abdominal:     General: Bowel sounds are normal. There is no distension.     Palpations: There is no mass.     Tenderness: There is abdominal tenderness.     Hernia: No hernia is present.    Genitourinary:    Comments: deferred Musculoskeletal:     Right lower leg: No edema.     Left lower leg: No edema.  Lymphadenopathy:     Cervical: No cervical adenopathy.  Skin:    General: Skin is warm.  Neurological:     General: No focal deficit present.     Mental Status: He is alert.     Deep Tendon Reflexes:     Reflex Scores:      Bicep reflexes are 2+ on the right side and 2+ on the left side.      Patellar reflexes are 2+ on the right side and 2+ on the left side.    Comments:  Bilateral upper and lower extremity strength 5/5  Psychiatric:        Mood and Affect: Mood normal.        Behavior: Behavior normal.        Thought Content: Thought content normal.        Judgment: Judgment normal.      No results found for any visits on 01/14/24.    The 10-year ASCVD risk score (Arnett DK, et al., 2019) is: 14.6%    Assessment & Plan:   Problem List Items Addressed This Visit       Cardiovascular and Mediastinum   Essential hypertension - Primary   Patient currently maintained on losartan  25 mg daily.  Blood pressure well-controlled.  He can check blood pressure at home and did not like.  Continue medication as prescribed      Relevant Orders   CBC   Comprehensive metabolic panel with GFR   TSH     Nervous and Auditory   Neuropathy   Patient currently maintained on 100 mg gabapentin  every morning and 400 mg gabapentin  every afternoon.  He is followed by neurosurgery.  Continue taking medication as prescribed      Relevant Orders   CBC   Comprehensive metabolic panel with GFR     Musculoskeletal and Integument   Rheumatoid arthritis of multiple sites with negative rheumatoid factor (HCC)   History of the same was followed by rheumatology in the past.  Patient's last medication was Cosentyx .  He is having "flareup".  Will check RF factor along with CRP and ESR.  He does have a history of negative RF factor in the past.  Consider increasing duloxetine  to 60 mg daily or re refer patient to rheumatology      Relevant Orders   High sensitivity CRP   Sedimentation rate   Rheumatoid factor     Other   Healthcare maintenance   Discussed age-appropriate immunizations and screening exams.  Did review patient's personal, surgical, social, family histories.  Patient is up-to-date with all age-appropriate vaccinations he would like.  Patient is up-to-date on CRC screening.  PSA for prostate cancer screening today.  Patient was given information at discharge  about preventative healthcare maintenance with anticipatory guidance      Depression with anxiety   Patient currently maintained on duloxetine  30 mg daily.  He denies HI/SI/AVH.  Continue taking medication as prescribed      Relevant Orders   CBC  Comprehensive metabolic panel with GFR   TSH   Elevated triglycerides with high cholesterol   History of the same pending lipid panel today      Relevant Orders   Lipid panel   Epigastric pain   Previously on Protonix  40 mg daily.  Has been off for extended period time we will reinitiate Protonix  20 mg daily for 30 days.  Pending lab work today      Relevant Medications   pantoprazole  (PROTONIX ) 20 MG tablet   Other Visit Diagnoses       Screening for prostate cancer       Relevant Orders   PSA     Screening for diabetes mellitus       Relevant Orders   Hemoglobin A1c       Return in about 1 year (around 01/13/2025) for CPE and Labs.    Margarie Shay, NP

## 2024-01-14 NOTE — Assessment & Plan Note (Signed)
 Discussed age-appropriate immunizations and screening exams.  Did review patient's personal, surgical, social, family histories.  Patient is up-to-date with all age-appropriate vaccinations he would like.  Patient is up-to-date on CRC screening.  PSA for prostate cancer screening today.  Patient was given information at discharge about preventative healthcare maintenance with anticipatory guidance

## 2024-01-14 NOTE — Patient Instructions (Signed)
 Nice to see you today I will be in touch with the labs once I have them  Follow up with me in 1 year

## 2024-01-15 LAB — CBC
HCT: 43 % (ref 39.0–52.0)
Hemoglobin: 14.5 g/dL (ref 13.0–17.0)
MCHC: 33.8 g/dL (ref 30.0–36.0)
MCV: 91.1 fl (ref 78.0–100.0)
Platelets: 288 10*3/uL (ref 150.0–400.0)
RBC: 4.73 Mil/uL (ref 4.22–5.81)
RDW: 13.9 % (ref 11.5–15.5)
WBC: 7.3 10*3/uL (ref 4.0–10.5)

## 2024-01-15 LAB — PSA: PSA: 2.6 ng/mL (ref 0.10–4.00)

## 2024-01-15 LAB — COMPREHENSIVE METABOLIC PANEL WITH GFR
ALT: 15 U/L (ref 0–53)
AST: 11 U/L (ref 0–37)
Albumin: 4.2 g/dL (ref 3.5–5.2)
Alkaline Phosphatase: 81 U/L (ref 39–117)
BUN: 24 mg/dL — ABNORMAL HIGH (ref 6–23)
CO2: 29 meq/L (ref 19–32)
Calcium: 9.6 mg/dL (ref 8.4–10.5)
Chloride: 104 meq/L (ref 96–112)
Creatinine, Ser: 1.43 mg/dL (ref 0.40–1.50)
GFR: 51.61 mL/min — ABNORMAL LOW (ref >60.00)
Glucose, Bld: 103 mg/dL — ABNORMAL HIGH (ref 70–99)
Potassium: 4.7 meq/L (ref 3.5–5.1)
Sodium: 140 meq/L (ref 135–145)
Total Bilirubin: 0.4 mg/dL (ref 0.2–1.2)
Total Protein: 6.2 g/dL (ref 6.0–8.3)

## 2024-01-15 LAB — LIPID PANEL
Cholesterol: 168 mg/dL (ref 0–200)
HDL: 36.4 mg/dL — ABNORMAL LOW (ref >39.00)
LDL Cholesterol: 105 mg/dL — ABNORMAL HIGH (ref 0–99)
NonHDL: 131.67
Total CHOL/HDL Ratio: 5
Triglycerides: 131 mg/dL (ref 0.0–149.0)
VLDL: 26.2 mg/dL (ref 0.0–40.0)

## 2024-01-15 LAB — HEMOGLOBIN A1C: Hgb A1c MFr Bld: 5.8 % (ref 4.6–6.5)

## 2024-01-15 LAB — TSH: TSH: 1.43 u[IU]/mL (ref 0.35–5.50)

## 2024-01-15 LAB — SEDIMENTATION RATE: Sed Rate: 15 mm/h (ref 0–20)

## 2024-01-15 LAB — HIGH SENSITIVITY CRP: CRP, High Sensitivity: 28.92 mg/L — ABNORMAL HIGH (ref 0.000–5.000)

## 2024-01-15 LAB — RHEUMATOID FACTOR: Rheumatoid fact SerPl-aCnc: <10 [IU]/mL (ref <14)

## 2024-01-16 ENCOUNTER — Ambulatory Visit: Payer: Self-pay | Admitting: Nurse Practitioner

## 2024-01-16 DIAGNOSIS — M0609 Rheumatoid arthritis without rheumatoid factor, multiple sites: Secondary | ICD-10-CM

## 2024-02-15 ENCOUNTER — Other Ambulatory Visit: Payer: Self-pay | Admitting: Nurse Practitioner

## 2024-02-15 DIAGNOSIS — G629 Polyneuropathy, unspecified: Secondary | ICD-10-CM

## 2024-03-01 ENCOUNTER — Ambulatory Visit: Payer: Self-pay

## 2024-03-01 ENCOUNTER — Telehealth: Payer: Self-pay | Admitting: Nurse Practitioner

## 2024-03-01 NOTE — Telephone Encounter (Signed)
 Patient called, left VM to return the call to the office to speak to NT.   Message from [facility] H sent at 03/01/2024 12:15 PM EDT  Patient has been running a low grade fever every night for the past week. He is unsure if this is related to what the rheumatologist will be seeing him for due to his lab results.

## 2024-03-01 NOTE — Addendum Note (Signed)
 Addended by: CLORIA ALBERTA CROME on: 03/01/2024 02:11 PM   Modules accepted: Orders, Level of Service

## 2024-03-01 NOTE — Telephone Encounter (Signed)
 Copied from CRM [id number]. Topic: Clinical - Medication Refill >> Mar 01, 2024 12:15 PM Benjamin Fields H wrote: Medication: gabapentin  (NEURONTIN ) 600 MG tablet  Has the patient contacted their pharmacy? Yes (Agent: If no, request that the patient contact the pharmacy for the refill. If patient does not wish to contact the pharmacy document the reason why and proceed with request.) (Agent: If yes, when and what did the pharmacy advise?) They told him to contact us .   This is the patient's preferred pharmacy:  [id number] [address] - [address] [place] [address] Phone: [phone] Fax: [phone]  Is this the correct pharmacy for this prescription? Yes If no, delete pharmacy and type the correct one.   Has the prescription been filled recently? No  Is the patient out of the medication? No  Has the patient been seen for an appointment in the last year OR does the patient have an upcoming appointment? Yes  Can we respond through MyChart? Yes  Agent: Please be advised that Rx refills may take up to 3 business days. We ask that you follow-up with your pharmacy.

## 2024-03-01 NOTE — Telephone Encounter (Signed)
 States temp is 99.1 to 100.3 About a week ago Yes, at night Cough No idea No Tylenol  PSA Na no Reason for Disposition  [1] Fever > 100.0 F (37.8 C) AND [2] bedridden (e.g., CVA, chronic illness, recovering from surgery)  Additional Information  Commented on: Answer Assessment    1. States temp is 99.1 to 100.3 2. About a week ago 3. Yes, at night 4. Cough 5. No idea 6. No 7. Tylenol  8. PSA 9. Na 10. no  Protocols used: Fever-A-AH    Documentation protocol not working.  Patient to be seen in 24 hours., apt tomorrow.

## 2024-03-01 NOTE — Telephone Encounter (Signed)
 Second call attempted, no answer, voicemail left.

## 2024-03-01 NOTE — Telephone Encounter (Signed)
 Per agent, pt calling back in to return call from nurse triage. There is a CRM for triage for fever as well as a CRM for a refill for neurontin  that needs to be triaged as well. Pt hung up before transfer to nurse triage. Per pt chart, pt was called by another triage nurse as well within the last few minutes. Placing in call back so not calling continuously. Please utilize triage encounter.

## 2024-03-01 NOTE — Telephone Encounter (Signed)
 This encounter was created in error - please disregard.

## 2024-03-01 NOTE — Telephone Encounter (Signed)
 Per agent, pt calling back in to return call from nurse triage. There is a CRM for triage for fever as well as a CRM for a refill for neurontin  that needs to be triaged as well. Pt hung up before transfer to nurse triage. Per pt chart, pt was called by another triage nurse as well within the last few minutes. Placing in call back so not calling continuously.

## 2024-03-02 ENCOUNTER — Ambulatory Visit (INDEPENDENT_AMBULATORY_CARE_PROVIDER_SITE_OTHER)
Admission: RE | Admit: 2024-03-02 | Discharge: 2024-03-02 | Disposition: A | Discharge status: Home / Self Care | Source: Ambulatory Visit | Attending: Internal Medicine | Admitting: Internal Medicine

## 2024-03-02 ENCOUNTER — Ambulatory Visit (INDEPENDENT_AMBULATORY_CARE_PROVIDER_SITE_OTHER): Admitting: Internal Medicine

## 2024-03-02 ENCOUNTER — Encounter: Payer: Self-pay | Admitting: Internal Medicine

## 2024-03-02 VITALS — BP 120/76 | HR 75 | Temp 98.5°F | Ht 69.75 in | Wt 183.2 lb

## 2024-03-02 DIAGNOSIS — R509 Fever, unspecified: Secondary | ICD-10-CM

## 2024-03-02 DIAGNOSIS — J181 Lobar pneumonia, unspecified organism: Secondary | ICD-10-CM | POA: Insufficient documentation

## 2024-03-02 HISTORY — DX: Lobar pneumonia, unspecified organism: J18.1

## 2024-03-02 MED ORDER — CEFTRIAXONE SODIUM 1 G IJ SOLR
1.0000 g | Freq: Once | INTRAMUSCULAR | Status: AC
Start: 2024-03-02 — End: 2024-03-02
  Administered 2024-03-02: 1 g via INTRAMUSCULAR

## 2024-03-02 MED ORDER — AZITHROMYCIN 250 MG PO TABS: ORAL_TABLET | ORAL | 0 refills | Status: DC

## 2024-03-02 NOTE — Addendum Note (Signed)
 Addended by: SEBASTIAN NORRIS A on: 03/02/2024 09:15 AM   Modules accepted: Orders

## 2024-03-02 NOTE — Progress Notes (Signed)
 Subjective:    Patient ID: Benjamin Fields, male    DOB: 01-27-1961, 63 y.o.   MRN: [id number]  HPI Here due to fever  Started a little over a week ago Every evening--his fever goes up some--max 100.3 Notices cold/chills when up for bathroom Usually better the next morning Feels tired Some cough--dry Gets easy DOE even just walking dog  Did have similar symptoms 20 years ago--had pneumonia Wonders if this could be flare of rheumatoid arthritis--going to rheumatologist next week  No head congestion No nasal drainage or post nasal drip No ear pain  Current Outpatient Medications on File Prior to Visit  Medication Sig Dispense Refill   diclofenac  Sodium (VOLTAREN ) 1 % GEL Apply a small grape sized dollop to lateral epicondyles 3 times daily. 150 g 1   DULoxetine  (CYMBALTA ) 30 MG capsule Take 1 capsule by mouth once daily 90 capsule 1   Elastic Bandages & Supports (TENNIS ELBOW STRAP) MISC As directed 1 each 2   gabapentin  (NEURONTIN ) 600 MG tablet Take 1 tablet (600 mg total) by mouth at bedtime. Continue to take the gabapentin  100mg  that you have at home. Take 1 in the morning and 1 at lunch. 30 tablet 0   levocetirizine (XYZAL ) 5 MG tablet Take 1 tablet (5 mg total) by mouth every evening. 90 tablet 3   losartan  (COZAAR ) 25 MG tablet Take 1 tablet (25 mg total) by mouth daily.     Multiple Vitamin (MULTIVITAMIN) tablet Take 1 tablet by mouth daily. OTC     pantoprazole  (PROTONIX ) 20 MG tablet Take 1 tablet (20 mg total) by mouth daily. 30 tablet 0   Current Facility-Administered Medications on File Prior to Visit  Medication Dose Route Frequency Provider Last Rate Last Admin   triamcinolone  acetonide (KENALOG -40) injection 40 mg  40 mg Intra-articular Once Berneta Elsie Sayre, MD        No Active Allergies  Past Medical History:  Diagnosis Date   Allergy     mild- no meds    Anxiety 03/09/18   Depression 03/09/2018   Hypertension 01/08/16   Neuromuscular disorder (HCC)  05/30/2018   mild nerve issue right leg    RA (rheumatoid arthritis) (HCC)     Past Surgical History:  Procedure Laterality Date   CERVICAL SPINE SURGERY  2010   COLONOSCOPY     COLONOSCOPY  04/2020   ROTATOR CUFF REPAIR Right    SPINE SURGERY  12/2004   TONSILLECTOMY      Family History  Problem Relation Age of Onset   Parkinson's disease Mother    Atrial fibrillation Mother    Arthritis Mother    Depression Mother    Heart failure Father    Hypertension Father    Prostate cancer Father 76   COPD Father    Heart disease Father    Colon cancer Neg Hx    Colon polyps Neg Hx    Esophageal cancer Neg Hx    Rectal cancer Neg Hx    Stomach cancer Neg Hx     Social History   Socioeconomic History   Marital status: Married    Spouse name: Administrator, arts   Number of children: Not on file   Years of education: Not on file   Highest education level: Master's degree (e.g., MA, MS, MEng, MEd, MSW, MBA)  Occupational History   Not on file  Tobacco Use   Smoking status: Never   Smokeless tobacco: Never  Vaping Use   Vaping status: Never  Used  Substance and Sexual Activity   Alcohol use: Yes    Comment: rare wine during the holiday   Drug use: Never   Sexual activity: Yes  Other Topics Concern   Not on file  Social History Narrative   Lives home with wife.  workd at [employer]  Education: Masters.  Children 2.  Caffeine 2 cups daily      Gabreial (30)   Grant (30)      Works at [employer] work    Social Drivers of [employer]: Low Risk  (03/01/2024)   Overall Financial Resource Strain (CARDIA)    Difficulty of Paying Living Expenses: Not very hard  Recent Concern: Financial Resource Strain - Medium Risk (01/11/2024)   Overall Financial Resource Strain (CARDIA)    Difficulty of Paying Living Expenses: Somewhat hard  Food Insecurity: No Food Insecurity (03/01/2024)   Hunger Vital Sign    Worried About Running Out of Food in the Last Year: Never true     Ran Out of Food in the Last Year: Never true  Transportation Needs: No Transportation Needs (03/01/2024)   PRAPARE - Administrator, Civil Service (Medical): No    Lack of Transportation (Non-Medical): No  Physical Activity: Insufficiently Active (03/01/2024)   Exercise Vital Sign    Days of Exercise per Week: 1 day    Minutes of Exercise per Session: 20 min  Stress: Stress Concern Present (03/01/2024)   [employer] of Occupational Health - Occupational Stress Questionnaire    Feeling of Stress: Rather much  Social Connections: Moderately Integrated (03/01/2024)   Social Connection and Isolation Panel    Frequency of Communication with Friends and Family: Never    Frequency of Social Gatherings with Friends and Family: Never    Attends Religious Services: More than 4 times per year    Active Member of [employer] or Organizations: Yes    Attends Engineer, structural: More than 4 times per year    Marital Status: Married  Catering manager Violence: Not on file   Review of Systems Some nausea--no vomiting Not much smell---may be some worse Appetite is off     Objective:   Physical Exam Constitutional:      Appearance: Normal appearance.  HENT:     Head:     Comments: No sinus tenderness    Right Ear: Tympanic membrane and ear canal normal.     Left Ear: Tympanic membrane and ear canal normal.     Mouth/Throat:     Pharynx: No oropharyngeal exudate or posterior oropharyngeal erythema.  Pulmonary:     Effort: Pulmonary effort is normal.     Breath sounds: No wheezing.     Comments: Early inspiratory crackles at both bases---- left > right Musculoskeletal:     Cervical back: Neck supple.  Lymphadenopathy:     Cervical: No cervical adenopathy.  Neurological:     Mental Status: He is alert.            Assessment & Plan:

## 2024-03-02 NOTE — Assessment & Plan Note (Signed)
 Has LLL pneumonia Will treat with rocephin  1gm today Z-pak Set up follow up CXR in 1 month

## 2024-03-02 NOTE — Assessment & Plan Note (Signed)
 With respiratory symptoms and some lung findings Will check CXR

## 2024-03-03 ENCOUNTER — Encounter: Payer: Self-pay | Admitting: Nurse Practitioner

## 2024-03-03 ENCOUNTER — Ambulatory Visit: Payer: Self-pay | Admitting: Internal Medicine

## 2024-03-03 NOTE — Telephone Encounter (Signed)
 Called from radiology to verify that xray has been reviewed. Informed that provider has reviewed and contacted patient.

## 2024-03-04 MED ORDER — GABAPENTIN 100 MG PO CAPS
ORAL_CAPSULE | ORAL | 0 refills | Status: DC
Start: 2024-03-04 — End: 2024-05-31

## 2024-03-07 ENCOUNTER — Other Ambulatory Visit: Payer: Self-pay

## 2024-03-07 ENCOUNTER — Emergency Department

## 2024-03-07 ENCOUNTER — Emergency Department
Admission: EM | Admit: 2024-03-07 | Discharge: 2024-03-07 | Disposition: A | Discharge status: Home / Self Care | Attending: Emergency Medicine | Admitting: Emergency Medicine

## 2024-03-07 DIAGNOSIS — J181 Lobar pneumonia, unspecified organism: Secondary | ICD-10-CM | POA: Diagnosis not present

## 2024-03-07 DIAGNOSIS — R509 Fever, unspecified: Secondary | ICD-10-CM | POA: Diagnosis present

## 2024-03-07 DIAGNOSIS — I10 Essential (primary) hypertension: Secondary | ICD-10-CM | POA: Diagnosis not present

## 2024-03-07 DIAGNOSIS — J189 Pneumonia, unspecified organism: Secondary | ICD-10-CM

## 2024-03-07 LAB — CBC
HCT: 42.6 % (ref 39.0–52.0)
Hemoglobin: 14.1 g/dL (ref 13.0–17.0)
MCH: 29.6 pg (ref 26.0–34.0)
MCHC: 33.1 g/dL (ref 30.0–36.0)
MCV: 89.5 fL (ref 80.0–100.0)
Platelets: 335 K/uL (ref 150–400)
RBC: 4.76 MIL/uL (ref 4.22–5.81)
RDW: 12.3 % (ref 11.5–15.5)
WBC: 10 K/uL (ref 4.0–10.5)
nRBC: 0 % (ref 0.0–0.2)

## 2024-03-07 LAB — BASIC METABOLIC PANEL WITH GFR
Anion gap: 10 (ref 5–15)
BUN: 21 mg/dL (ref 8–23)
CO2: 25 mmol/L (ref 22–32)
Calcium: 10 mg/dL (ref 8.9–10.3)
Chloride: 104 mmol/L (ref 98–111)
Creatinine, Ser: 1.6 mg/dL — ABNORMAL HIGH (ref 0.61–1.24)
GFR, Estimated: 48 mL/min — ABNORMAL LOW (ref >60)
Glucose, Bld: 111 mg/dL — ABNORMAL HIGH (ref 70–99)
Potassium: 4.4 mmol/L (ref 3.5–5.1)
Sodium: 139 mmol/L (ref 135–145)

## 2024-03-07 MED ORDER — LEVOFLOXACIN 500 MG PO TABS: 500.0000 mg | ORAL_TABLET | Freq: Every day | ORAL | 0 refills | Status: DC

## 2024-03-07 MED ORDER — SODIUM CHLORIDE 0.9 % IV BOLUS
500.0000 mL | Freq: Once | INTRAVENOUS | Status: AC
  Administered 2024-03-07: 500 mL via INTRAVENOUS

## 2024-03-07 MED ORDER — LEVOFLOXACIN IN D5W 750 MG/150ML IV SOLN
750.0000 mg | Freq: Once | INTRAVENOUS | Status: AC
  Administered 2024-03-07: 750 mg via INTRAVENOUS
  Filled 2024-03-07: qty 150

## 2024-03-07 NOTE — ED Provider Notes (Signed)
 [facility] Provider Note    Event Date/Time   First MD Initiated Contact with Patient 03/07/24 1337     (approximate)   History   Cough and recent PNA   HPI  Benjamin Fields is a 63 y.o. male with history of HTN, RA, anxiety presents to the ER with continued fever and concerns of low 02.  Patient was diagnosed with pneumonia by his pcp on  03/02/24.  Given Rocephin  IM in the office and started on zpack.  Patient finished the zpack yesterday and awoke today with continued fever.  No cp/sob/confusion.  Wife states he has continued going to work every day.        Physical Exam   Triage Vital Signs: ED Triage Vitals  Encounter Vitals Group     BP 03/07/24 1200 110/64     Girls Systolic BP Percentile --      Girls Diastolic BP Percentile --      Boys Systolic BP Percentile --      Boys Diastolic BP Percentile --      Pulse Rate 03/07/24 1200 92     Resp 03/07/24 1200 20     Temp 03/07/24 1200 98.3 F (36.8 C)     Temp Source 03/07/24 1200 Oral     SpO2 03/07/24 1200 93 %     Weight 03/07/24 1157 178 lb (80.7 kg)     Height 03/07/24 1157 5' 10 (1.778 m)     Head Circumference --      Peak Flow --      Pain Score 03/07/24 1158 6     Pain Loc --      Pain Education --      Exclude from Growth Chart --     Most recent vital signs: Vitals:   03/07/24 1200  BP: 110/64  Pulse: 92  Resp: 20  Temp: 98.3 F (36.8 C)  SpO2: 93%     General: Awake, no distress.   CV:  Good peripheral perfusion. regular rate and  rhythm Resp:  Normal effort. Lungs with crackling in the left lower lung Abd:  No distention.   Other:      ED Results / Procedures / Treatments   Labs (all labs ordered are listed, but only abnormal results are displayed) Labs Reviewed  BASIC METABOLIC PANEL WITH GFR - Abnormal; Notable for the following components:      Result Value   Glucose, Bld 111 (*)    Creatinine, Ser 1.60 (*)    GFR, Estimated 48 (*)    All other  components within normal limits  CBC     EKG     RADIOLOGY Chest x-ray    PROCEDURES:   Procedures  Critical Care: None Chief Complaint  Patient presents with   Cough   recent PNA      MEDICATIONS ORDERED IN ED: Medications  levofloxacin  (LEVAQUIN ) IVPB 750 mg (750 mg Intravenous New Bag/Given 03/07/24 1435)  sodium chloride  0.9 % bolus 500 mL (500 mLs Intravenous New Bag/Given 03/07/24 1432)     IMPRESSION / MDM / ASSESSMENT AND PLAN / ED COURSE  I reviewed the triage vital signs and the nursing notes.                              Differential diagnosis includes, but is not limited to, CAP, lung cancer, sepsis  Patient's presentation is most consistent with acute illness /  injury with system symptoms.   Cardiac monitor no Medications given: Levaquin  750 mg IV, ns 500ml iv  Patient's labs are reassuring  Chest x-ray independently reviewed interpreted by me as being positive for continued left lower lung pneumonia.  When compared to the previous x-ray does appear to be a slightly bit larger.  However this could be due to exposure versus was recently positioning.  Due to the patient having been on antibiotics have concerns that he has failed outpatient antibiotics.  However since he was on a Z-Pak and there is a lot of resistance to this medication I can give him IV medicine here today and start p.o. tomorrow.  We did discuss admission.  I did offer admission.  Patient and his wife agree that he would be most comfortable at home since he is not septic etc.  Feel that this is appropriate decision.  However if he is worsening over the next 2 to 3 days he must return emergency department as he would most likely need to be admitted at that time.  They are in agreement with his treatment plan.  Prescription for Levaquin  sent to the pharmacy.        FINAL CLINICAL IMPRESSION(S) / ED DIAGNOSES   Final diagnoses:  Community acquired pneumonia of left lower lobe of lung      Rx / DC Orders   ED Discharge Orders          Ordered    levofloxacin  (LEVAQUIN ) 500 MG tablet  Daily        03/07/24 1417             Note:  This document was prepared using Dragon voice recognition software and may include unintentional dictation errors.    Gasper Devere ORN, PA-C 03/07/24 1551    Malvina Alm DASEN, MD 03/07/24 [phone]

## 2024-03-07 NOTE — ED Triage Notes (Signed)
 Pt to ED for cough, low SPO2 at home 88-92% and low grade fever of 100.4 this week, recently treated for PNA for 5 days with abx until yesterday (unsure name). Pt is 96-97% on RA in triage. Respirations are unlabored. States gets winded easily while walking his dog.

## 2024-03-07 NOTE — Discharge Instructions (Addendum)
 Follow-up with your regular doctor in 2 to 3 days.  Return emergency department if worsening.  Take the antibiotic as prescribed.  You will start your medication that was sent to the pharmacy tomorrow.  You have already been given the maximum dose for today.  Mucinex for cough. I advise you take 2 to 3 days off of work.  A note is attached.  This will help you heal faster.

## 2024-03-08 ENCOUNTER — Telehealth: Payer: Self-pay

## 2024-03-08 NOTE — Telephone Encounter (Signed)
 Per chart review pt went to [facility] ED on 03/07/24 Sending to Adina Crandall NP.

## 2024-03-08 NOTE — Telephone Encounter (Signed)
 SABRA

## 2024-03-08 NOTE — Telephone Encounter (Signed)
 Noted. I see he did go to ED and I did review the note

## 2024-03-09 ENCOUNTER — Telehealth: Payer: Self-pay

## 2024-03-09 NOTE — Telephone Encounter (Signed)
 Copied from CRM [id number]. Topic: General - Other >> Mar 09, 2024 12:09 PM Aisha D wrote: Reason for CRM: Pt stated that he was recently in the hospital due to having pneumonia and was prescribed an antibiotic. Pt stated that he wanted to inform Lynwood Crandall, NP, that he still feels the same, sometimes has a low grade fever, and oxygen levels are in the upper 80's. Pt stated that he is currently on day 3 out of 7 for the antibiotics and just wanted to give his provider an update.   ----------------------------------------------------------------------- From previous Reason for Contact - Scheduling: Patient/patient representative is calling to schedule an appointment. Refer to attachments for appointment information.

## 2024-03-10 NOTE — Telephone Encounter (Signed)
 Pt has scheduled appointment to see Dr. Elsworth 7/18

## 2024-03-10 NOTE — Telephone Encounter (Signed)
 Can we set him up with me or someone in the office to evaluate him please. This week preferably

## 2024-03-10 NOTE — Telephone Encounter (Addendum)
 Left detailed voicemail for patient to call the office back to schedule appointment this week.

## 2024-03-11 ENCOUNTER — Telehealth: Payer: Self-pay

## 2024-03-11 NOTE — Transitions of Care (Post Inpatient/ED Visit) (Signed)
 Unable to reach patient by phone and left v/m requesting call back at [phone].       03/11/2024  Name: Benjamin Fields MRN: [id number] DOB: [birth year]  Today's TOC FU Call Status: Today's TOC FU Call Status:: Unsuccessful Call (1st Attempt) Unsuccessful Call (1st Attempt) Date: 03/11/24  Attempted to reach the patient regarding the most recent Inpatient/ED visit.  Follow Up Plan: Additional outreach attempts will be made to reach the patient to complete the Transitions of Care (Post Inpatient/ED visit) call.   Signature Laray Arenas, LPN

## 2024-03-12 ENCOUNTER — Ambulatory Visit: Payer: Self-pay | Admitting: Family Medicine

## 2024-03-12 ENCOUNTER — Ambulatory Visit: Admitting: Family Medicine

## 2024-03-12 ENCOUNTER — Encounter: Payer: Self-pay | Admitting: Family Medicine

## 2024-03-12 ENCOUNTER — Ambulatory Visit
Admission: RE | Admit: 2024-03-12 | Discharge: 2024-03-12 | Disposition: A | Discharge status: Home / Self Care | Source: Ambulatory Visit | Attending: Family Medicine | Admitting: Family Medicine

## 2024-03-12 ENCOUNTER — Telehealth: Payer: Self-pay | Admitting: Nurse Practitioner

## 2024-03-12 ENCOUNTER — Ambulatory Visit (INDEPENDENT_AMBULATORY_CARE_PROVIDER_SITE_OTHER)
Admission: RE | Admit: 2024-03-12 | Discharge: 2024-03-12 | Disposition: A | Discharge status: Home / Self Care | Source: Ambulatory Visit | Attending: Family Medicine | Admitting: Family Medicine

## 2024-03-12 VITALS — BP 136/72 | HR 95 | Temp 100.1°F | Ht 70.0 in | Wt 180.1 lb

## 2024-03-12 DIAGNOSIS — J181 Lobar pneumonia, unspecified organism: Secondary | ICD-10-CM

## 2024-03-12 DIAGNOSIS — R509 Fever, unspecified: Secondary | ICD-10-CM | POA: Diagnosis not present

## 2024-03-12 LAB — POC INFLUENZA A&B (BINAX/QUICKVUE)
Influenza A, POC: NEGATIVE
Influenza B, POC: NEGATIVE

## 2024-03-12 LAB — POC COVID19 BINAXNOW: SARS Coronavirus 2 Ag: NEGATIVE

## 2024-03-12 MED ORDER — PREDNISONE 10 MG PO TABS
ORAL_TABLET | ORAL | 0 refills | Status: DC
Start: 2024-03-12 — End: 2024-04-02

## 2024-03-12 NOTE — Assessment & Plan Note (Addendum)
 Reviewed notes from Dr Jimmy on 7/8 ER on 7/13,  Reviewed hospital records, lab results and studies in detail    Today cough is mildly worse/not very productive  Fever continues on and off (low grade) Hypoxia only on exertion (today 94% after walking)   On exam rales at left base  Covid and flu tests neg today  Cxr noted stability of LLL irregular density /not improved  CT scan recommended-will proceed with it  Call back and Er precautions noted in detail today    Addendum CT with features or possible organizing pneumonia  Will treat with prednisone  and plan follow up with pcp , may need pulm care

## 2024-03-12 NOTE — Telephone Encounter (Signed)
 Copied from CRM [id number]. Topic: General - Other >> Mar 12, 2024  3:27 PM Gennette ORN wrote: Reason for CRM: Patient call to speak with [place].

## 2024-03-12 NOTE — Telephone Encounter (Signed)
 Addressed, see x-ray results

## 2024-03-12 NOTE — Progress Notes (Signed)
 Subjective:    Patient ID: Benjamin Fields, male    DOB: June 04, 1961, 63 y.o.   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 of ER visit for CAP  He has history of RA   Seen in ER at [facility] on 7/13  Was originally seen by Dr Jimmy and treatment with rocephin  and apak   In ER given IV levaquin   Then levaquin  po (tolerates it well)   today Cough is worse  If he coughs hard - gets light headed  Fever is on and off  Only shortness of breath with exertion / also fatigued  No wheezing  Low appetite   Tmax 100.1  Last week 100.3   02 gets low on ambulation- high 80s / then gets back up into mid 90s when he sits   Feels very blah   Over the counter Day time multi symptom cough med   Had severe pna 20 y ago and was hospitalized-never found out what from    No immune modifying med for RA for 2 years    Lab Results  Component Value Date   NA 139 03/07/2024   K 4.4 03/07/2024   CO2 25 03/07/2024   GLUCOSE 111 (H) 03/07/2024   BUN 21 03/07/2024   CREATININE 1.60 (H) 03/07/2024   CALCIUM 10.0 03/07/2024   GFR 51.61 (L) 01/14/2024   GFRNONAA 48 (L) 03/07/2024   Lab Results  Component Value Date   WBC 10.0 03/07/2024   HGB 14.1 03/07/2024   HCT 42.6 03/07/2024   MCV 89.5 03/07/2024   PLT 335 03/07/2024    Cxr today and previous  CT Chest Wo Contrast Result Date: 03/12/2024 CLINICAL DATA:  Pneumonia, complication suspected, xray done persistent opacity in LLL , not improving with antibiotics as planned, still running intermittent fever. EXAM: CT CHEST WITHOUT CONTRAST TECHNIQUE: Multidetector CT imaging of the chest was performed following the standard protocol without IV contrast. RADIATION DOSE REDUCTION:  This exam was performed according to the departmental dose-optimization program which includes automated exposure control, adjustment of the mA and/or kV according to patient size and/or use of iterative reconstruction technique. COMPARISON:  None Available. FINDINGS: Cardiovascular: Normal cardiac size. No pericardial effusion. No aortic aneurysm. There are coronary artery calcifications, in keeping with coronary artery disease. Mediastinum/Nodes: Visualized thyroid  gland appears grossly unremarkable. No solid / cystic mediastinal masses. The esophagus is nondistended precluding optimal assessment. There are few mildly prominent mediastinal and hilar lymph nodes, which do not meet the size criteria for lymphadenopathy and though indeterminate most likely benign/reactive in the given clinical setting. No axillary lymphadenopathy by size criteria. Lungs/Pleura: The central tracheo-bronchial tree is patent. There are multiple heterogeneous opacities with air bronchogram with largest multi segmental area involving the left lung lower lobe. There are interspersed emphysematous blebs within. There are also patchy opacities in the lingular segment of left upper lobe and right lower lobe mainly in the periphery or in peribronchovascular distribution. Majority of the opacities exhibit ground-glass changes with peripheral rim of solid consolidation, also known as atoll or reverse halo sign, classically described with organizing pneumonia however, also seen in other  infections, infarctions, granulomatous disease, inflammation, etc. No pleural effusion. No lung collapse. No discrete suspicious lung nodule. Upper Abdomen: Visualized upper abdominal viscera within normal limits. Musculoskeletal: The visualized soft tissues of the chest wall are grossly unremarkable. There is a sclerotic focus along the posterior aspect of T4 vertebral body measuring up to 6 x 11 mm. This is incompletely characterized on the current exam. Patient  has outside facility thoracic spine MRI from 03/11/2019 however, images or report is not available on institutional PACS. Please correlate with thoracic spine report to document stability of this lesion. There are mild multilevel degenerative changes in the visualized spine. IMPRESSION: 1. There are multiple heterogeneous opacities with air bronchogram with largest multi segmental area involving the left lung lower lobe. There are also patchy opacities in the lingular segment of left upper lobe and right lower lobe mainly in the periphery or in peribronchovascular distribution. Majority of the opacities exhibit ground-glass changes with peripheral rim of solid consolidation, also known as atoll or reverse halo sign, classically described with organizing pneumonia; however, also seen in other infections, infarctions, granulomatous disease, inflammation, etc. 2. There is a sclerotic focus along the posterior aspect of T4 vertebral body measuring up to 6 x 11 mm. This is incompletely characterized on the current exam. Patient has outside facility thoracic spine MRI from 03/11/2019 however, images or report is not available on institutional PACS. Please correlate with thoracic spine report to document stability of this lesion. 3. Multiple other nonacute observations, as described above. Electronically Signed   By: Ree Molt M.D.   On: 03/12/2024 16:50   DG Chest 2 View Result Date: 03/12/2024 CLINICAL DATA:  Left lower lobe pneumonia. EXAM: CHEST - 2 VIEW COMPARISON:  March 07, 2024.  March 02, 2024. FINDINGS: The heart size and mediastinal contours are within normal limits. Right lung is clear. Grossly stable irregular density is seen in left lung base most consistent with pneumonia. The visualized skeletal structures are unremarkable. IMPRESSION: Grossly stable left lower lobe irregular density which may represent pneumonia, but neoplasm cannot be excluded given the lack of significant change over the last 10  days. Continued short-term follow-up with radiographs is recommended or potentially CT scan may be performed for further evaluation. Electronically Signed   By: Lynwood Landy Raddle M.D.   On: 03/12/2024 12:56   DG Chest 2 View Result Date: 03/07/2024 CLINICAL DATA:  Dyspnea with exertion, cough. EXAM: CHEST - 2 VIEW COMPARISON:  March 02, 2024. FINDINGS: The heart size and mediastinal contours are within normal limits. Right lung is clear. Grossly stable left lower lobe airspace opacity is noted most consistent with pneumonia. The visualized skeletal structures are unremarkable. IMPRESSION: Grossly stable left lower lobe airspace opacity is noted most consistent with pneumonia. Followup PA and lateral chest X-ray is recommended in 3-4 weeks following trial of antibiotic therapy to ensure resolution and exclude underlying malignancy. Electronically Signed   By: Lynwood Landy Raddle M.D.   On: 03/07/2024 12:25   DG Chest 2 View Result Date: 03/02/2024 CLINICAL DATA:  Cough and fever. EXAM: CHEST - 2 VIEW COMPARISON:  Chest radiograph 05/28/2023 FINDINGS: Consolidation within the posterior left lower lobe typical of pneumonia. There may be a trace left pleural effusion. The heart is normal in size. No pneumothorax or pulmonary edema. No acute osseous findings. IMPRESSION: Left lower lobe pneumonia. Possible trace left pleural effusion. Followup PA and lateral chest X-ray is recommended in 3-4 weeks following trial of antibiotic therapy to ensure resolution and  exclude underlying malignancy. These results will be called to the ordering clinician or representative by the Radiologist Assistant, and communication documented in the PACS or [employer]. Electronically Signed   By: Andrea Gasman M.D.   On: 03/02/2024 17:12    Non smoker   Results for orders placed or performed in visit on 03/12/24  POC COVID-19 BinaxNow   Collection Time: 03/12/24 12:30 PM  Result Value Ref Range   SARS Coronavirus 2 Ag Negative  Negative  POC Influenza A&B(BINAX/QUICKVUE)   Collection Time: 03/12/24 12:30 PM  Result Value Ref Range   Influenza A, POC Negative Negative   Influenza B, POC Negative Negative     Patient Active Problem List   Diagnosis Date Noted   Fever 03/02/2024   Lobar pneumonia (HCC) 03/02/2024   Epigastric pain 01/14/2024   Neuropathy 10/29/2023   Pruritus 10/29/2023   Effusion of right knee 01/17/2023   History of fusion of cervical spine 07/30/2022   Spinal stenosis of cervical region 07/30/2022   Cervical disc herniation 07/30/2022   Lateral epicondylitis of right elbow 12/04/2021   Snores 10/18/2021   Elevated triglycerides with high cholesterol 04/12/2021   Abdominal bloating 03/14/2021   Depression with anxiety 12/22/2020   Low back pain 02/17/2020   Left hip pain 02/17/2020   Healthcare maintenance 11/16/2019   Rheumatoid arthritis of multiple sites with negative rheumatoid factor (HCC) 05/19/2018   Tinea versicolor 05/19/2018   Essential hypertension 03/17/2018   DDD (degenerative disc disease), cervical 03/16/2018   Past Medical History:  Diagnosis Date   Allergy     mild- no meds    Anxiety 03/09/18   Depression 03/09/2018   Hypertension 01/08/16   Neuromuscular disorder (HCC) 05/30/2018   mild nerve issue right leg    RA (rheumatoid arthritis) (HCC)    Past Surgical History:  Procedure Laterality Date   CERVICAL SPINE SURGERY  2010   COLONOSCOPY     COLONOSCOPY  04/2020   ROTATOR CUFF REPAIR Right    SPINE SURGERY  12/2004   TONSILLECTOMY     Social History   Tobacco Use   Smoking status: Never   Smokeless tobacco: Never  Vaping Use   Vaping status: Never Used  Substance Use Topics   Alcohol use: Yes    Comment: rare wine during the holiday   Drug use: Never   Family History  Problem Relation Age of Onset   Parkinson's disease Mother    Atrial fibrillation Mother    Arthritis Mother    Depression Mother    Heart failure Father    Hypertension  Father    Prostate cancer Father 4   COPD Father    Heart disease Father    Colon cancer Neg Hx    Colon polyps Neg Hx    Esophageal cancer Neg Hx    Rectal cancer Neg Hx    Stomach cancer Neg Hx    No Known Allergies Current Outpatient Medications on File Prior to Visit  Medication Sig Dispense Refill   diclofenac  Sodium (VOLTAREN ) 1 % GEL Apply a small grape sized dollop to lateral epicondyles 3 times daily. 150 g 1   DULoxetine  (CYMBALTA ) 30 MG capsule Take 1 capsule by mouth once daily 90 capsule 1   Elastic Bandages & Supports (TENNIS ELBOW STRAP) MISC As directed 1 each 2   gabapentin  (NEURONTIN ) 100 MG capsule Take 1 capsule (100mg ) in the am and take 4 capsules (400mg ) QHS 450 capsule 0   levocetirizine (XYZAL ) 5  MG tablet Take 1 tablet (5 mg total) by mouth every evening. 90 tablet 3   levofloxacin  (LEVAQUIN ) 500 MG tablet Take 1 tablet (500 mg total) by mouth daily. 7 tablet 0   losartan  (COZAAR ) 25 MG tablet Take 1 tablet (25 mg total) by mouth daily.     Multiple Vitamin (MULTIVITAMIN) tablet Take 1 tablet by mouth daily. OTC     pantoprazole  (PROTONIX ) 20 MG tablet Take 1 tablet (20 mg total) by mouth daily. 30 tablet 0   No current facility-administered medications on file prior to visit.    Review of Systems  Constitutional:  Positive for activity change, appetite change, fatigue and fever. Negative for unexpected weight change.  HENT:  Positive for postnasal drip. Negative for congestion, rhinorrhea, sore throat and trouble swallowing.   Eyes:  Negative for pain, redness, itching and visual disturbance.  Respiratory:  Positive for cough and shortness of breath. Negative for choking, chest tightness, wheezing and stridor.   Cardiovascular:  Negative for chest pain and palpitations.  Gastrointestinal:  Negative for abdominal pain, blood in stool, constipation, diarrhea and nausea.  Endocrine: Negative for cold intolerance, heat intolerance, polydipsia and polyuria.   Genitourinary:  Negative for difficulty urinating, dysuria, frequency and urgency.  Musculoskeletal:  Negative for arthralgias, joint swelling and myalgias.  Skin:  Negative for pallor and rash.  Neurological:  Negative for dizziness, tremors, weakness, numbness and headaches.  Hematological:  Negative for adenopathy. Does not bruise/bleed easily.  Psychiatric/Behavioral:  Negative for decreased concentration and dysphoric mood. The patient is not nervous/anxious.        Objective:   Physical Exam Constitutional:      General: He is not in acute distress.    Appearance: Normal appearance. He is well-developed and normal weight. He is not ill-appearing or diaphoretic.  HENT:     Head: Normocephalic and atraumatic.     Right Ear: Tympanic membrane and ear canal normal.     Left Ear: Tympanic membrane and ear canal normal.     Ears:     Comments: Some cerumen    Nose: Nose normal. No congestion.     Mouth/Throat:     Mouth: Mucous membranes are moist.     Pharynx: Oropharynx is clear. No oropharyngeal exudate or posterior oropharyngeal erythema.  Eyes:     General:        Right eye: No discharge.        Left eye: No discharge.     Conjunctiva/sclera: Conjunctivae normal.     Pupils: Pupils are equal, round, and reactive to light.  Neck:     Thyroid : No thyromegaly.     Vascular: No carotid bruit or JVD.  Cardiovascular:     Rate and Rhythm: Regular rhythm. Tachycardia present.     Heart sounds: Normal heart sounds.     No gallop.  Pulmonary:     Effort: Pulmonary effort is normal. No respiratory distress.     Breath sounds: No stridor. Rales present. No wheezing or rhonchi.     Comments: Rales in left base No wheeze or stridor No shortness of breath with speech or walking  Abdominal:     General: There is no distension or abdominal bruit.     Palpations: Abdomen is soft.  Musculoskeletal:     Cervical back: Normal range of motion and neck supple.     Right lower leg: No  edema.     Left lower leg: No edema.  Lymphadenopathy:  Cervical: No cervical adenopathy.  Skin:    General: Skin is warm and dry.     Coloration: Skin is not pale.     Findings: No rash.  Neurological:     Mental Status: He is alert.     Coordination: Coordination normal.     Deep Tendon Reflexes: Reflexes are normal and symmetric. Reflexes normal.  Psychiatric:        Mood and Affect: Mood normal.           Assessment & Plan:   Problem List Items Addressed This Visit       Respiratory   Lobar pneumonia [facility]) - Primary   Reviewed notes from Dr Jimmy on 7/8 ER on 7/13,  Reviewed hospital records, lab results and studies in detail    Today cough is mildly worse/not very productive  Fever continues on and off (low grade) Hypoxia only on exertion (today 94% after walking)   On exam rales at left base  Covid and flu tests neg today  Cxr noted stability of LLL irregular density /not improved  CT scan recommended-will proceed with it  Call back and Er precautions noted in detail today    Addendum CT with features or possible organizing pneumonia  Will treat with prednisone  and plan follow up with pcp , may need pulm care       Relevant Orders   DG Chest 2 View (Completed)   CT Chest Wo Contrast (Completed)     Other   Fever   Relevant Orders   POC COVID-19 BinaxNow (Completed)   POC Influenza A&B(BINAX/QUICKVUE) (Completed)

## 2024-03-12 NOTE — Patient Instructions (Addendum)
 Continue to take your current medicine  An expectorant (guaifenesin) over the counter may help loosen phlegm if needed   Try and keep up fluids   Covid and flu tests today  Chest xray now - we will reach out with results

## 2024-03-15 ENCOUNTER — Telehealth: Payer: Self-pay | Admitting: *Deleted

## 2024-03-15 NOTE — Telephone Encounter (Signed)
 Left VM requesting pt to call the office back

## 2024-03-15 NOTE — Telephone Encounter (Signed)
 Copied from CRM [id number]. Topic: Clinical - Medical Advice >> Mar 15, 2024 10:38 AM Benjamin Fields wrote: Reason for CRM: Patient asking for [name], CMA to give him a call when available

## 2024-03-15 NOTE — Telephone Encounter (Signed)
-----   Message from [facility] sent at 03/14/2024 11:07 AM EDT ----- I started him on a 40 mg prednisone  taper but if this is actually organizing pneumonia he may need higher doses longer term and consider pulmonary care, he should be scheduling a follow up with you next week  Poor guy!  Tower pool: please call early this week to see how he is feeling and make sure he gets pcp appointment

## 2024-03-15 NOTE — Telephone Encounter (Signed)
 Left VM requesting pt to call the office back   Per Matt we can use his same day on Thursday to see pt.

## 2024-03-15 NOTE — Telephone Encounter (Signed)
 Pt returned my call he is feeling better. Saturday was his last fever and hasn't had once since. His O2 stats are remaining around 93 and he has no other issues. Appt scheduled with Adina, NP on Thursday. Pt advise if sxs worsen or any new sxs before f/u to let us  know. FYI to PCP and Dr. Randeen

## 2024-03-18 ENCOUNTER — Ambulatory Visit (INDEPENDENT_AMBULATORY_CARE_PROVIDER_SITE_OTHER): Admitting: Nurse Practitioner

## 2024-03-18 VITALS — BP 122/62 | HR 72 | Temp 98.1°F | Ht 70.0 in | Wt 184.2 lb

## 2024-03-18 DIAGNOSIS — J181 Lobar pneumonia, unspecified organism: Secondary | ICD-10-CM

## 2024-03-18 NOTE — Progress Notes (Signed)
 Acute Office Visit  Subjective:     Patient ID: Benjamin Fields, male    DOB: 1960-09-20, 63 y.o.   MRN: [id number]  Chief Complaint  Patient presents with   Follow-up    Pt complains of FU for inflamed lung. Pt finished antibiotic and is now on prednisone .     HPI Patient is in today for pneumonia follow-up  Patient was seen by Charlie Denise on 03/02/2024 for a fever.  Chest x-ray was acquired and showed lobar pneumonia patient was treated with Rocephin  1 g in office and set up with a Z-Pak.  Of note patient did not improve and was seen in the emergency department on 03/07/2024 at that juncture he had finished the Z-Pak the day before and still had a fever.  Patient was given Levaquin  IV along with a small fluid bolus he was to finish an outpatient course of Levaquin  500 mg daily.  Patient was seen in office on 03/12/2024 by [employer].  At that juncture cough is mildly worse but not productive he was having intermittent fevers of low-grade some hypoxia with exertion.  CT scan of chest was obtained which showed features of possible organizing pneumonia patient was treated with prednisone  and is here for follow-up.  He may require pulmonary consultation. He states tha the feels like he is doing good. He is currently on prednisone . He is on the fourth day of treatment which is 3 tablets a day.  State that he did by a pulse ox and with exertion he will drop down to 90%. States that he is not having any more fevers.   States that approx 20 years ago he ha pna and was hosptialized Review of Systems  Constitutional:  Positive for malaise/fatigue. Negative for chills and fever.  Respiratory:  Positive for cough and shortness of breath (improving).   Gastrointestinal:  Negative for abdominal pain, diarrhea, nausea and vomiting.        Objective:    BP 122/62   Pulse 72   Temp 98.1 F (36.7 C) (Oral)   Ht 5' 10 (1.778 m)   Wt 184 lb 3.2 oz (83.6 kg)   SpO2 94%   BMI 26.43 kg/m  BP  Readings from Last 3 Encounters:  03/18/24 122/62  03/12/24 136/72  03/07/24 118/68   Wt Readings from Last 3 Encounters:  03/18/24 184 lb 3.2 oz (83.6 kg)  03/12/24 180 lb 2 oz (81.7 kg)  03/07/24 178 lb (80.7 kg)   SpO2 Readings from Last 3 Encounters:  03/18/24 94%  03/12/24 94%  03/07/24 96%      Physical Exam Vitals and nursing note reviewed.  Constitutional:      Appearance: Normal appearance.  Cardiovascular:     Rate and Rhythm: Normal rate and regular rhythm.     Heart sounds: Normal heart sounds.  Pulmonary:     Effort: Pulmonary effort is normal.     Breath sounds: Normal breath sounds.  Neurological:     Mental Status: He is alert.     No results found for any visits on 03/18/24.      Assessment & Plan:   Problem List Items Addressed This Visit       Respiratory   Lobar pneumonia (HCC) - Primary   Patient currently maintained on steroids from previous provider about halfway through treatment.  Feeling okay at this juncture no longer having fevers patient did do a gram of Rocephin  in the beginning a Z-Pak Levaquin  IV, Levaquin   p.o.  Urgent referral to pulmonology for further evaluation and afraid that prednisone  is pending a false picture patient will have close follow-up with me      Relevant Orders   Ambulatory referral to Pulmonology    No orders of the defined types were placed in this encounter.   Return in about 2 weeks (around 04/01/2024) for pna/shob .  Adina Crandall, NP

## 2024-03-18 NOTE — Patient Instructions (Addendum)
 Nice to see you today I have referred you to pulmonology. Follow up with in 2 weeks

## 2024-03-18 NOTE — Assessment & Plan Note (Signed)
 Patient currently maintained on steroids from previous provider about halfway through treatment.  Feeling okay at this juncture no longer having fevers patient did do a gram of Rocephin  in the beginning a Z-Pak Levaquin  IV, Levaquin  p.o.  Urgent referral to pulmonology for further evaluation and afraid that prednisone  is pending a false picture patient will have close follow-up with me

## 2024-03-31 ENCOUNTER — Ambulatory Visit: Admitting: Nurse Practitioner

## 2024-04-02 ENCOUNTER — Ambulatory Visit: Admitting: Nurse Practitioner

## 2024-04-02 ENCOUNTER — Encounter: Payer: Self-pay | Admitting: Nurse Practitioner

## 2024-04-02 VITALS — BP 120/70 | HR 69 | Temp 98.3°F | Ht 70.0 in | Wt 186.2 lb

## 2024-04-02 DIAGNOSIS — I2510 Atherosclerotic heart disease of native coronary artery without angina pectoris: Secondary | ICD-10-CM

## 2024-04-02 DIAGNOSIS — M21371 Foot drop, right foot: Secondary | ICD-10-CM

## 2024-04-02 DIAGNOSIS — M48061 Spinal stenosis, lumbar region without neurogenic claudication: Secondary | ICD-10-CM

## 2024-04-02 DIAGNOSIS — R202 Paresthesia of skin: Secondary | ICD-10-CM | POA: Diagnosis not present

## 2024-04-02 DIAGNOSIS — F418 Other specified anxiety disorders: Secondary | ICD-10-CM

## 2024-04-02 HISTORY — DX: Paresthesia of skin: R20.2

## 2024-04-02 HISTORY — DX: Atherosclerotic heart disease of native coronary artery without angina pectoris: I25.10

## 2024-04-02 HISTORY — DX: Foot drop, right foot: M21.371

## 2024-04-02 LAB — VITAMIN B12: Vitamin B-12: 698 pg/mL (ref 211–911)

## 2024-04-02 MED ORDER — ATORVASTATIN CALCIUM 10 MG PO TABS: 10.0000 mg | ORAL_TABLET | Freq: Every day | ORAL | 1 refills | Status: AC

## 2024-04-02 NOTE — Assessment & Plan Note (Signed)
 Query of the beginnings of right foot drop did review CT lumbar spine that showed bilateral neuroforaminal stenosis L5-S1.  Patient also experiencing paresthesias we will check B12 level today ambulatory referral to neurosurgery for further evaluation

## 2024-04-02 NOTE — Assessment & Plan Note (Signed)
 B12 level today

## 2024-04-02 NOTE — Progress Notes (Signed)
 Established Patient Office Visit  Subjective   Patient ID: Benjamin Fields, male    DOB: [birth year]  Age: 63 y.o. MRN: [id number]  Chief Complaint  Patient presents with   Follow-up    Pt has no complaints with SOB.      HPI  PNA: Patient was seen by me on 03/18/2024 for follow-up for left lobar pneumonia.  In the interim patient was given a gram of Rocephin  IM and a Z-Pak in the beginning.  Did not have any improvement within the emergency department was given Levaquin  IV and started on Levaquin  p.o.  Had follow-up with [facility] on 03/12/2024 started on steroids and CT of the chest was ordered.  Patient was referred to pulmonology on 03/18/2024 and is here for follow-up.  Patient has appoint with pulmonology on 04-27-24.  States that he does feel good over all. States that when he walks the dogs he will feel winded. States that he is coughing some at night but improving  States that he is having some numbness/tingling in both lower feet. States that he has noticed that he is having the feet. States that he has been having this for a couple weeks. No injury to the back.     Review of Systems  Constitutional:  Negative for chills and fever.  Respiratory:  Positive for shortness of breath.   Cardiovascular:  Negative for chest pain.  Neurological:  Positive for tingling. Negative for weakness and headaches.      Objective:     BP 120/70   Pulse 69   Temp 98.3 F (36.8 C) (Oral)   Ht 5' 10 (1.778 m)   Wt 186 lb 3.2 oz (84.5 kg)   SpO2 96%   BMI 26.72 kg/m    Physical Exam Vitals and nursing note reviewed.  Constitutional:      Appearance: Normal appearance.  Cardiovascular:     Rate and Rhythm: Normal rate and regular rhythm.     Pulses:          Posterior tibial pulses are 2+ on the right side and 2+ on the left side.     Heart sounds: Normal heart sounds.  Pulmonary:     Effort: Pulmonary effort is normal.     Breath sounds: Normal breath sounds.   Neurological:     Mental Status: He is alert.     Deep Tendon Reflexes:     Reflex Scores:      Patellar reflexes are 2+ on the right side and 2+ on the left side.    Comments: Bilateral lower extremity strength 5/5  Plantar flexion 5/5 Dorsiflexion 5/5      No results found for any visits on 04/02/24.    The 10-year ASCVD risk score (Arnett DK, et al., 2019) is: 14.1%    Assessment & Plan:   Problem List Items Addressed This Visit       Cardiovascular and Mediastinum   Coronary artery disease involving native coronary artery of native heart without angina pectoris   Incidental finding on recent CT scan of chest with coronary artery depositions.  Patient did have a family cardiac history we will start patient prophylactically on atorvastatin  10 mg daily last LDL was 105 checked approximate 3 months ago.  Patient will follow-up in 3 months for fasting lab visit only      Relevant Medications   atorvastatin  (LIPITOR) 10 MG tablet     Other   Paresthesia - Primary   B12 level  today      Relevant Orders   Ambulatory referral to Neurosurgery   Vitamin B12   Right foot drop   Query of the beginnings of right foot drop did review CT lumbar spine that showed bilateral neuroforaminal stenosis L5-S1.  Patient also experiencing paresthesias we will check B12 level today ambulatory referral to neurosurgery for further evaluation      Relevant Orders   Ambulatory referral to Neurosurgery   Other Visit Diagnoses       Neuroforaminal stenosis of lumbar spine       Relevant Orders   Ambulatory referral to Neurosurgery       Return if symptoms worsen or fail to improve.    Adina Crandall, NP

## 2024-04-02 NOTE — Assessment & Plan Note (Signed)
 Incidental finding on recent CT scan of chest with coronary artery depositions.  Patient did have a family cardiac history we will start patient prophylactically on atorvastatin  10 mg daily last LDL was 105 checked approximate 3 months ago.  Patient will follow-up in 3 months for fasting lab visit only

## 2024-04-02 NOTE — Patient Instructions (Signed)
 Nice to see you today I have referred you to the neurosugeron You need a 3 month fasting lab visit Follow up with me as needed

## 2024-04-05 ENCOUNTER — Ambulatory Visit: Payer: Self-pay | Admitting: Nurse Practitioner

## 2024-04-05 MED ORDER — DULOXETINE HCL 60 MG PO CPEP: 60.0000 mg | ORAL_CAPSULE | Freq: Every day | ORAL | 0 refills | Status: DC

## 2024-04-05 NOTE — Telephone Encounter (Signed)
 [id number]

## 2024-04-05 NOTE — Telephone Encounter (Signed)
 Needs 4-6 week follow up for MDD. This can be virtual

## 2024-04-13 ENCOUNTER — Ambulatory Visit (INDEPENDENT_AMBULATORY_CARE_PROVIDER_SITE_OTHER): Admitting: Internal Medicine

## 2024-04-13 ENCOUNTER — Ambulatory Visit
Admission: RE | Admit: 2024-04-13 | Discharge: 2024-04-13 | Disposition: A | Discharge status: Home / Self Care | Source: Ambulatory Visit | Attending: Internal Medicine | Admitting: Internal Medicine

## 2024-04-13 ENCOUNTER — Encounter: Payer: Self-pay | Admitting: Internal Medicine

## 2024-04-13 VITALS — BP 100/70 | HR 98 | Temp 98.8°F | Ht 70.0 in | Wt 188.8 lb

## 2024-04-13 DIAGNOSIS — R0602 Shortness of breath: Secondary | ICD-10-CM | POA: Diagnosis not present

## 2024-04-13 DIAGNOSIS — J181 Lobar pneumonia, unspecified organism: Secondary | ICD-10-CM | POA: Insufficient documentation

## 2024-04-13 DIAGNOSIS — L814 Other melanin hyperpigmentation: Secondary | ICD-10-CM | POA: Insufficient documentation

## 2024-04-13 DIAGNOSIS — L821 Other seborrheic keratosis: Secondary | ICD-10-CM | POA: Insufficient documentation

## 2024-04-13 HISTORY — DX: Other melanin hyperpigmentation: L81.4

## 2024-04-13 LAB — NITRIC OXIDE: Nitric Oxide: 18

## 2024-04-13 MED ORDER — AMOXICILLIN-POT CLAVULANATE 875-125 MG PO TABS: 1.0000 | ORAL_TABLET | Freq: Two times a day (BID) | ORAL | 1 refills | Status: DC

## 2024-04-13 MED ORDER — ALBUTEROL SULFATE HFA 108 (90 BASE) MCG/ACT IN AERS: 2.0000 | INHALATION_SPRAY | Freq: Four times a day (QID) | RESPIRATORY_TRACT | 2 refills | Status: AC | PRN

## 2024-04-13 NOTE — Patient Instructions (Signed)
 Obtain CT chest to assess lungs Start albuterol  inhaler 2 puffs every 6 hrs as needed for cough Recommend starting AUTO CPAP 4-16 cm h20 with mask of choice  Avoid Allergens and Irritants Avoid secondhand smoke Avoid SICK contacts Recommend  Masking  when appropriate Recommend Keep up-to-date with vaccinations

## 2024-04-13 NOTE — Progress Notes (Signed)
 [facility] Pulmonary Medicine Consultation      Date: 04/13/2024,   MRN# [id number] Benjamin Fields 1961/05/22   CHIEF COMPLAINT:   Assessment of abnormal CT chest   HISTORY OF PRESENT ILLNESS   63 year old pleasant white male seen today for abnormal CT chest Several months ago patient had signs symptoms of fevers chills Patient obtained 1 round of antibiotic which did not help-patient was given IM Rocephin  and azithromycin  Patient subsequently did not improve obtain CT chest and chest x-ray which showed evidence of pneumonia,  patient was given a second round of antibiotic which seem to have resolved his symptoms along with prednisone  taper Patient was given Levaquin  and prednisone   Patient is a non-smoker Patient has a history of rheumatoid arthritis had been on multiple medications in the past patient diagnosed with psoriatic arthritis however is not on any type of immunosuppressive therapy  Patient has diagnosed with OSA with AHI of 2 years ago  No exacerbation at this time No evidence of heart failure at this time No evidence or signs of infection at this time No respiratory distress No fevers, chills, nausea, vomiting, diarrhea No evidence of lower extremity edema No evidence hemoptysis    Assessment of ASTHMA FeNO 18   ppb-Elevated exhaled Nitric oxide  testing is NOT  consistent with type II inflammation       Chest x-ray 04/13/2024 reviewed in detail The left-sided opacification has been resolving however the right side of the lung has increased opacification consistent with progressive pneumonia on the right side      PAST MEDICAL HISTORY   Past Medical History:  Diagnosis Date   Allergy     mild- no meds    Anxiety 03/09/18   Depression 03/09/2018   Hypertension 01/08/16   Neuromuscular disorder (HCC) 05/30/2018   mild nerve issue right leg    RA (rheumatoid arthritis) (HCC)      SURGICAL HISTORY   Past Surgical History:  Procedure Laterality  Date   CERVICAL SPINE SURGERY  2010   COLONOSCOPY     COLONOSCOPY  04/2020   ROTATOR CUFF REPAIR Right    SPINE SURGERY  12/2004   TONSILLECTOMY       FAMILY HISTORY   Family History  Problem Relation Age of Onset   Parkinson's disease Mother    Atrial fibrillation Mother    Arthritis Mother    Depression Mother    Heart failure Father    Hypertension Father    Prostate cancer Father 21   COPD Father    Heart disease Father    Colon cancer Neg Hx    Colon polyps Neg Hx    Esophageal cancer Neg Hx    Rectal cancer Neg Hx    Stomach cancer Neg Hx      SOCIAL HISTORY   Social History   Tobacco Use   Smoking status: Never   Smokeless tobacco: Never  Vaping Use   Vaping status: Never Used  Substance Use Topics   Alcohol use: Yes    Comment: rare wine during the holiday   Drug use: Never     MEDICATIONS    Home Medication:  Current Outpatient Rx   Order #: [id number] Class: Normal   Order #: [id number] Class: Normal   Order #: [id number] Class: Normal   Order #: [id number] Class: No Print   Order #: [id number] Class: Historical Med    Current Medication:  Current Outpatient Medications:    atorvastatin  (LIPITOR) 10 MG tablet, Take 1 tablet (10 mg total) by  mouth daily., Disp: 90 tablet, Rfl: 1   DULoxetine  (CYMBALTA ) 60 MG capsule, Take 1 capsule (60 mg total) by mouth daily., Disp: 90 capsule, Rfl: 0   gabapentin  (NEURONTIN ) 100 MG capsule, Take 1 capsule (100mg ) in the am and take 4 capsules (400mg ) QHS, Disp: 450 capsule, Rfl: 0   losartan  (COZAAR ) 25 MG tablet, Take 1 tablet (25 mg total) by mouth daily., Disp: , Rfl:    Multiple Vitamin (MULTIVITAMIN) tablet, Take 1 tablet by mouth daily. OTC, Disp: , Rfl:     ALLERGIES   Patient has no known allergies.   There were no vitals taken for this visit.   Review of Systems: Gen:  Denies  fever, sweats, chills weight loss  HEENT: Denies blurred vision, double vision, ear pain, eye pain, hearing loss, nose  bleeds, sore throat Cardiac:  No dizziness, chest pain or heaviness, chest tightness,edema, No JVD Resp:   No cough, -sputum production, -shortness of breath,-wheezing, -hemoptysis,  Other:  All other systems negative   Physical Examination:   General Appearance: No distress  EYES PERRLA, EOM intact.   NECK Supple, No JVD Pulmonary: normal breath sounds, No wheezing.  CardiovascularNormal S1,S2.  No m/r/g.   Abdomen: Benign, Soft, non-tender. Neurology UE/LE 5/5 strength, no focal deficits Ext pulses intact, cap refill intact ALL OTHER ROS ARE NEGATIVE       ASSESSMENT/PLAN    63 year old pleasant white male seen today for abnormal CT chest likely related to multifocal pneumonia with heterogeneous opacities left greater than right At this time patient feels like his symptoms have returned however he is nontoxic-appearing and his [place] did not show any significant inflammation and is ambulating pulse oximetry in the office did not show any significant hypoxia  I recommend obtaining CT of the chest for interval changes Left-sided opacification is resolving however there is new right lower lobe opacification that seems to have progressed  patient with moderate sleep apnea with AHI of 30 Patient with fatigue, recommend starting auto CPAP 4-16 with mask of choice  Avoid Allergens and Irritants Avoid secondhand smoke Avoid SICK contacts Recommend  Masking  when appropriate Recommend Keep up-to-date with vaccinations   MEDICATION ADJUSTMENTS/LABS AND TESTS ORDERED: CT chest-start Augmentin  Start CPAP Avoid Allergens and Irritants Avoid secondhand smoke Avoid SICK contacts Recommend  Masking  when appropriate Recommend Keep up-to-date with vaccinations    CURRENT MEDICATIONS REVIEWED AT LENGTH WITH PATIENT TODAY   Patient  satisfied with Plan of action and management. All questions answered   Follow up 3 months   I spent a total of 65 minutes dedicated to the care  of this patient on the date of this encounter to include pre-visit review of records, face-to-face time with the patient discussing conditions above, post visit ordering of testing, clinical documentation with the electronic health record, making appropriate referrals as documented, and communicating necessary information to the patient's healthcare team.    The Patient requires high complexity decision making for assessment and support, frequent evaluation and titration of therapies, application of advanced monitoring technologies and extensive interpretation of multiple databases.  Patient satisfied with Plan of action and management. All questions answered    Nickolas Alm Cellar, M.D.  [facility]  Medical Director [facility] Medical Director [facility] Cardio-Pulmonary Department

## 2024-04-14 ENCOUNTER — Encounter: Payer: Self-pay | Admitting: Nurse Practitioner

## 2024-04-14 ENCOUNTER — Encounter: Payer: Self-pay | Admitting: Internal Medicine

## 2024-04-14 NOTE — Telephone Encounter (Signed)
 Copied from CRM [id number]. Topic: Clinical - Lab/Test Results >> Apr 14, 2024  4:26 PM Isabell A wrote: Reason for CRM: Patient returning phone call for CT results - advised MyChart message Dr. Isaiah was just calling to let you know that you have pneumonia on the right side and this is why he has sent in antibiotics for you.     Patient has additional questions and would like to speak with Dr.Kasa.   Callback number: [phone]

## 2024-04-22 ENCOUNTER — Encounter: Payer: Self-pay | Admitting: Internal Medicine

## 2024-04-22 ENCOUNTER — Ambulatory Visit (INDEPENDENT_AMBULATORY_CARE_PROVIDER_SITE_OTHER): Admitting: Internal Medicine

## 2024-04-22 VITALS — BP 108/60 | HR 91 | Temp 98.7°F | Ht 70.0 in | Wt 182.4 lb

## 2024-04-22 DIAGNOSIS — R9389 Abnormal findings on diagnostic imaging of other specified body structures: Secondary | ICD-10-CM | POA: Diagnosis not present

## 2024-04-22 DIAGNOSIS — I2510 Atherosclerotic heart disease of native coronary artery without angina pectoris: Secondary | ICD-10-CM

## 2024-04-22 DIAGNOSIS — R0602 Shortness of breath: Secondary | ICD-10-CM

## 2024-04-22 DIAGNOSIS — J181 Lobar pneumonia, unspecified organism: Secondary | ICD-10-CM

## 2024-04-22 DIAGNOSIS — G4733 Obstructive sleep apnea (adult) (pediatric): Secondary | ICD-10-CM

## 2024-04-22 NOTE — Patient Instructions (Signed)
 Regarding pneumonia  recommend continuing antibiotics as prescribed This will last for several weeks in your body Repeat CT chest in 2 months  Regarding shortness of breath and cough Continue albuterol  as needed for cough or shortness of breath  Regarding sleep apnea We will place order to start CPAP therapy  Assessing for coronary artery disease Recommend cardiology consultation to assess coronary artery disease   Avoid Allergens and Irritants Avoid secondhand smoke Avoid SICK contacts Recommend  Masking  when appropriate Recommend Keep up-to-date with vaccinations

## 2024-04-22 NOTE — Progress Notes (Signed)
 [facility] Pulmonary Medicine Consultation      Date: 04/22/2024,   MRN# [id number] ORVILE CORONA [birth year]   CHIEF COMPLAINT:   Follow up assessment of pneumonia and abnormal CT chest  HISTORY OF PRESENT ILLNESS   Previous OV FeNO 18   ppb-Elevated exhaled Nitric oxide  testing is NOT  consistent with type II inflammation Non toxic appearing, well nourished [place] did not show any significant inflammation and is ambulating pulse oximetry in the office did not show any significant hypoxia Patient is a non-smoker Patient has a history of rheumatoid arthritis had been on multiple medications in the past patient diagnosed with psoriatic arthritis however is not on any type of immunosuppressive therapy  Patient was given Levaquin  and prednisone  several months ago Patient has diagnosed with OSA with AHI of 2 years ago Severe AHi 65  No exacerbation at this time No evidence of heart failure at this time No evidence or signs of infection at this time No respiratory distress No fevers, chills, nausea, vomiting, diarrhea No evidence of lower extremity edema No evidence hemoptysis   Severe CT chest have been reviewed opacity resolving with new RT sided opacity      Chest x-ray 04/13/2024 reviewed in detail The left-sided opacification has been resolving however the right side of the lung has increased opacification consistent with progressive pneumonia on the right side     CT chest reviewed in detail with patient Patient does have inflammatory lymphadenopathy likely related to pneumonia Resolved and left-sided pneumonia Right-sided pneumonia seems to be improving with antibiotics Recommend repeat CT chest in 2 months No signs of infection at this time No significant respiratory distress CT chest noted calcifications in the coronary arteries Recommend cardiology referral  PAST MEDICAL HISTORY   Past Medical History:  Diagnosis Date   Allergy     mild- no meds     Anxiety 65/15/19   Depression 65/15/2019   Hypertension 65/15/17   Neuromuscular disorder (HCC) 65/12/2017   mild nerve issue right leg    RA 63 (rheumatoid arthritis) (HCC)      SURGICAL HISTORY   Past Surgical History:  Procedure Laterality Date   CERVICAL SPINE SURGERY  63   COLONOSCOPY     COLONOSCOPY  04/2020   ROTATOR CUFF REPAIR Right    SPINE SURGERY  12/2004   TONSILLECTOMY       FAMILY HISTORY   Family History  Problem Relation Age of Onset   Parkinson's disease Mother    Atrial fibrillation Mother    Arthritis Mother    Depression Mother    Heart failure Father    Hypertension Father    Prostate cancer Father 65   COPD Father    Heart disease Father    Colon cancer Neg Hx    Colon polyps Neg Hx    Esophageal cancer Neg Hx    Rectal cancer Neg Hx    Stomach cancer Neg Hx      SOCIAL HISTORY   Social History   Tobacco Use   Smoking status: Never   Smokeless tobacco: Never  Vaping Use   Vaping status: Never Used  Substance Use Topics   Alcohol use: Yes    Comment: rare wine during the holiday   Drug use: Never     MEDICATIONS    Home Medication:  Current Outpatient Rx   Order #: [id number] Class: Normal   Order #: [id number] Class: Normal   Order #: [id number] Class: Normal   Order #: [id number] Class: Normal   Order #:  [id number] Class: Normal   Order #: [id number] Class: No Print   Order #: [id number] Class: Historical Med    Current Medication:  Current Outpatient Medications:    albuterol  (VENTOLIN  HFA) 108 (90 Base) MCG/ACT inhaler, Inhale 2 puffs into the lungs every 6 (six) hours as needed for wheezing or shortness of breath., Disp: 8 g, Rfl: 2   amoxicillin -clavulanate (AUGMENTIN ) 875-125 MG tablet, Take 1 tablet by mouth 2 (two) times daily. 10 days, Disp: 20 tablet, Rfl: 1   atorvastatin  (LIPITOR) 10 MG tablet, Take 1 tablet (10 mg total) by mouth daily., Disp: 90 tablet, Rfl: 1   DULoxetine  (CYMBALTA ) 60 MG capsule, Take 1 capsule (60 mg  total) by mouth daily., Disp: 90 capsule, Rfl: 0   gabapentin  (NEURONTIN ) 100 MG capsule, Take 1 capsule (100mg ) in the am and take 4 capsules (400mg ) QHS, Disp: 450 capsule, Rfl: 0   losartan  (COZAAR ) 25 MG tablet, Take 1 tablet (25 mg total) by mouth daily., Disp: , Rfl:    Multiple Vitamin (MULTIVITAMIN) tablet, Take 1 tablet by mouth daily. OTC, Disp: , Rfl:     BP 108/60   Pulse 91   Temp 98.7 F (37.1 C)   Ht 5' 10 (1.778 m)   Wt 182 lb 6.4 oz (82.7 kg)   SpO2 95%   BMI 26.17 kg/m    Review of Systems: Gen:  Denies  fever, sweats, chills weight loss  HEENT: Denies blurred vision, double vision, ear pain, eye pain, hearing loss, nose bleeds, sore throat Cardiac:  No dizziness, chest pain or heaviness, chest tightness,edema, No JVD Resp:   No cough, -sputum production, +shortness of breath,-wheezing, -hemoptysis,  Other:  All other systems negative   Physical Examination:   General Appearance: No distress  EYES PERRLA, EOM intact.   NECK Supple, No JVD Pulmonary: normal breath sounds, No wheezing.  CardiovascularNormal S1,S2.  No m/r/g.   Abdomen: Benign, Soft, non-tender. Neurology UE/LE 5/5 strength, no focal deficits Ext pulses intact, cap refill intact ALL OTHER ROS ARE NEGATIVE     ASSESSMENT/PLAN    63 year old pleasant white male seen today for abnormal CT chest likely related to multifocal pneumonia with heterogeneous opacities left greater than right At this time patient feels like his symptoms have returned however he is nontoxic-appearing and his repeat Ct chest may have new RT sided opacity, patient was prescribed another round of ABX with AUGMENTIN . Wifealso present for examination-ALL questions answered   Assessment & Plan Lobar pneumonia, unspecified organism [facility]) Patient has completed 2 rounds of ABX with Levaquin (July 2025) and Augmentin  (August 2025) Bilateral lobar pneumonia Patient will need a longer time to feel better Patient currently  on Augmentin  No exacerbation at this time No evidence of heart failure at this time No evidence or signs of infection at this time No respiratory distress No fevers, chills, nausea, vomiting, diarrhea No evidence of lower extremity edema No evidence hemoptysis  Abnormal CT of the chest Findings are consistent with pneumonia I would recommend follow up CT chest in 2 months for further assessment and interval changes OSA (obstructive sleep apnea) Severe OSA AHI 30 Start auto CPAP 5-15  SOB (shortness of breath) Related to pneumonia intermittent wheezing Continue albuterol  as needed CAD in native artery Recommend cardiology referral for further assessment    MEDICATION ADJUSTMENTS/LABS AND TESTS ORDERED: Recommend starting CPAP Avoid Allergens and Irritants Avoid secondhand smoke Avoid SICK contacts Recommend  Masking  when appropriate Recommend Keep up-to-date with vaccinations Repeat CT chest in 2 months Albuterol   as needed   CURRENT MEDICATIONS REVIEWED AT LENGTH WITH PATIENT TODAY   Patient  satisfied with Plan of action and management. All questions answered   Follow up 3 months   I spent a total of 56 minutes dedicated to the care of this patient on the date of this encounter to include pre-visit review of records, face-to-face time with the patient discussing conditions above, post visit ordering of testing, clinical documentation with the electronic health record, making appropriate referrals as documented, and communicating necessary information to the patient's healthcare team.    The Patient requires high complexity decision making for assessment and support, frequent evaluation and titration of therapies, application of advanced monitoring technologies and extensive interpretation of multiple databases.  Patient satisfied with Plan of action and management. All questions answered    Nickolas Alm Cellar, M.D.  [facility]   Medical Director [facility] [place] Medical Director [facility] Cardio-Pulmonary Department

## 2024-04-23 NOTE — Progress Notes (Unsigned)
 Referring Physician:  Wendee Lynwood HERO, NP [address] [place],  [place] [address]  Primary Physician:  Benjamin Lynwood HERO, NP  History of Present Illness: 04/29/2024 Benjamin Fields has a history of HTN, CAD, neuropathy, RA, depression, anxiety, elevated triglycerides, high cholesterol.   History of ACDF C4-C6. Previous imaging showed he had incomplete fusion at C5-C6.   Last seen by Dr. Clois on 06/03/23 for left sided LBP. He has known neuropathy on right leg.   He has 4-6 week history of more constant LBP with left posterior leg pain to his foot. He does okay during the day, but pain is worse at night. He has cramping in his left calf at night. He has numbness, tingling, and weakness in left leg. Pain is worse with laying down. No right leg pain.   He has been under a lot of stress at work lately. He has a Health and safety inspector job.   Tobacco use: Does not smoke.   Bowel/Bladder Dysfunction: none  Conservative measures:  Physical therapy:  has not participated in for his back Multimodal medical therapy including regular antiinflammatories: tramadol , aleve , cymbalta , gabapentin   Injections:  no epidural steroid injections  Past Surgery:  C4-6 Fusion about 15 years ago by Dr. Barbarann   The symptoms are causing a significant impact on the patient's life.   Review of Systems:  A 10 point review of systems is negative, except for the pertinent positives and negatives detailed in the HPI.  Past Medical History: Past Medical History:  Diagnosis Date   Allergy     mild- no meds    Anxiety 03/09/18   Depression 03/09/2018   Hypertension 01/08/16   Neuromuscular disorder (HCC) 05/30/2018   mild nerve issue right leg    RA (rheumatoid arthritis) [facility])     Past Surgical History: Past Surgical History:  Procedure Laterality Date   CERVICAL SPINE SURGERY  2010   COLONOSCOPY     COLONOSCOPY  04/2020   ROTATOR CUFF REPAIR Right    SPINE SURGERY  12/2004   TONSILLECTOMY       Allergies: Allergies as of 04/29/2024   (No Known Allergies)    Medications: Outpatient Encounter Medications as of 04/29/2024  Medication Sig   albuterol  (VENTOLIN  HFA) 108 (90 Base) MCG/ACT inhaler Inhale 2 puffs into the lungs every 6 (six) hours as needed for wheezing or shortness of breath.   amoxicillin -clavulanate (AUGMENTIN ) 875-125 MG tablet Take 1 tablet by mouth 2 (two) times daily. 10 days   atorvastatin  (LIPITOR) 10 MG tablet Take 1 tablet (10 mg total) by mouth daily.   DULoxetine  (CYMBALTA ) 60 MG capsule Take 1 capsule (60 mg total) by mouth daily.   gabapentin  (NEURONTIN ) 100 MG capsule Take 1 capsule (100mg ) in the am and take 4 capsules (400mg ) QHS   losartan  (COZAAR ) 25 MG tablet Take 1 tablet (25 mg total) by mouth daily.   Multiple Vitamin (MULTIVITAMIN) tablet Take 1 tablet by mouth daily. OTC   No facility-administered encounter medications on file as of 04/29/2024.    Social History: Social History   Tobacco Use   Smoking status: Never   Smokeless tobacco: Never  Vaping Use   Vaping status: Never Used  Substance Use Topics   Alcohol use: Yes    Comment: rare wine during the holiday   Drug use: Never    Family Medical History: Family History  Problem Relation Age of Onset   Parkinson's disease Mother    Atrial fibrillation Mother  Arthritis Mother    Depression Mother    Heart failure Father    Hypertension Father    Prostate cancer Father 54   COPD Father    Heart disease Father    Colon cancer Neg Hx    Colon polyps Neg Hx    Esophageal cancer Neg Hx    Rectal cancer Neg Hx    Stomach cancer Neg Hx     Physical Examination: Vitals:   04/29/24 1002  BP: 126/78    General: Patient is well developed, well nourished, calm, collected, and in no apparent distress. Attention to examination is appropriate.  Respiratory: Patient is breathing without any difficulty.   NEUROLOGICAL:     Awake, alert, oriented to person, place, and  time.  Speech is clear and fluent. Fund of knowledge is appropriate.   Cranial Nerves: Pupils equal round and reactive to light.  Facial tone is symmetric.    No abnormal lesions on exposed skin.   Strength: Side Biceps Triceps Deltoid Interossei Grip Wrist Ext. Wrist Flex.  R 5 5 5 5 5 5 5   L 5 5 5 5 5 5 5    Side Iliopsoas Quads Hamstring PF DF EHL  R 5 5 5 5 5 5   L 5 5 5 5 5 5    Reflexes are 2+ and symmetric at the biceps, brachioradialis, patella and achilles.   Hoffman's is absent.  Clonus is not present.   Bilateral upper and lower extremity sensation is intact to light touch.     No pain with IR/ER of both hips.   He can heel stand on both feet. He can toe stand on right/left foot.   Gait is normal.     Medical Decision Making  Imaging: Lumbar spine CT dated 05/28/23:  LUMBAR SPINE:   Segmentation: 5 lumbar type vertebrae.   Alignment: No evidence of traumatic malalignment.   Vertebrae: No acute fracture. Chronic right transverse process fracture of L1.   Paraspinal and other soft tissues: See above.   Disc levels: Mild multilevel spondylosis. Mild disc space height loss and degenerative endplate changes at L5-S1. Spinal canal narrowing is greatest at L5-S1 where it is mild secondary to posterior disc bulge. Neural foraminal narrowing is greatest at L5-S1 bilaterally where it is mild-moderate.   IMPRESSION: 1. No acute abnormality in the abdomen or pelvis. 2. No acute fracture in the lumbar spine. 3. Nonobstructing 5 mm stone in the right kidney. 4. Colonic diverticulosis without diverticulitis.   Aortic Atherosclerosis (ICD10-I70.0).     Electronically Signed   By: Benjamin Fields M.D.   On: 05/28/2023 22:06  I have personally reviewed the images and agree with the above interpretation.  Assessment and Plan: [name] has a history of ACDF C4-C6. Previous imaging showed he had incomplete fusion at C5-C6.   He has 4-6 week history of more  constant LBP with left posterior leg pain to his foot. He does okay during the day, but pain is worse at night with cramping in his left calf. He has numbness, tingling, and weakness in left leg.   Previous lumbar CT 05/28/23 showed DDD L5-S1 with disc bulge and foraminal stenosis.   Of note, chest CT on 04/13/24 showed nonspecific sclerotic focus in T4 that was seen previously.   Treatment options discussed with patient and following plan made:   - Discussed sclerotic lesion T4 on chest CT and we reviewed images. This would not cause current symptoms. Radiology did not recommend further workup/imaging. He  does have repeat chest CT scheduled in November.  - Trial of robaxin . Reviewed dosing and side effects. Discussed this can cause drowsiness.  - Discussed medrol  dose pack- he was just on steroids a month ago for pneumonia. Will hold off for now.  - MRI of lumbar spine to further evaluate for spinal stenosis.   - Will schedule MyChart visit to review MRI results once I get them back.   I spent a total of 30 minutes in face-to-face and non-face-to-face activities related to this patient's care today including review of outside records, review of imaging, review of symptoms, physical exam, discussion of differential diagnosis, discussion of treatment options, and documentation.   Thank you for involving me in the care of this patient.   Glade Boys PA-C Dept. of Neurosurgery

## 2024-04-27 ENCOUNTER — Ambulatory Visit: Admitting: Student in an Organized Health Care Education/Training Program

## 2024-04-29 ENCOUNTER — Encounter: Payer: Self-pay | Admitting: Orthopedic Surgery

## 2024-04-29 ENCOUNTER — Ambulatory Visit: Admitting: Orthopedic Surgery

## 2024-04-29 VITALS — BP 126/78 | Ht 70.0 in | Wt 182.0 lb

## 2024-04-29 DIAGNOSIS — M5416 Radiculopathy, lumbar region: Secondary | ICD-10-CM

## 2024-04-29 DIAGNOSIS — M4807 Spinal stenosis, lumbosacral region: Secondary | ICD-10-CM | POA: Diagnosis not present

## 2024-04-29 DIAGNOSIS — M47816 Spondylosis without myelopathy or radiculopathy, lumbar region: Secondary | ICD-10-CM

## 2024-04-29 DIAGNOSIS — M51372 Other intervertebral disc degeneration, lumbosacral region with discogenic back pain and lower extremity pain: Secondary | ICD-10-CM

## 2024-04-29 DIAGNOSIS — M8588 Other specified disorders of bone density and structure, other site: Secondary | ICD-10-CM

## 2024-04-29 MED ORDER — METHOCARBAMOL 500 MG PO TABS: 500.0000 mg | ORAL_TABLET | Freq: Three times a day (TID) | ORAL | 0 refills | Status: DC | PRN

## 2024-04-29 NOTE — Patient Instructions (Signed)
 It was so nice to see you today. Thank you so much for coming in.    Radiology was not concerned about spot seen at T4 on your chest CT scan. This would not cause your lower back and leg pain.   I want to get an MRI of your lumbar to look into things further. We will get this approved through your insurance and they will call you to schedule the appointment. Ask about your patient responsibility. You do not need to pay this prior to getting MRI, they can bill you.   After you have the MRI, it can take 14-28 days for me to get the results back. If I don't have them in 2 weeks, we will call to try to get the results.   Once I have the results, we will call you to schedule a follow up MyChart visit with me to review them.   I sent a prescription for methocarbamol  to help with muscle spasms. Use only as needed and be careful, this can make you sleepy.   Please do not hesitate to call if you have any questions or concerns. You can also message me in MyChart.   Glade Boys PA-C [phone]     The physicians and staff at [facility] are committed to providing excellent care. You may receive a survey asking for feedback about your experience at our office. We value you your feedback and appreciate you taking the time to to fill it out. The [facility] leadership team is also available to discuss your experience in person, feel free to contact us  [phone].

## 2024-05-03 ENCOUNTER — Emergency Department

## 2024-05-03 ENCOUNTER — Encounter: Payer: Self-pay | Admitting: *Deleted

## 2024-05-03 ENCOUNTER — Observation Stay
Admission: EM | Admit: 2024-05-03 | Discharge: 2024-05-05 | Disposition: A | Discharge status: Home / Self Care | Attending: Hospitalist | Admitting: Hospitalist

## 2024-05-03 ENCOUNTER — Other Ambulatory Visit: Payer: Self-pay

## 2024-05-03 DIAGNOSIS — M5416 Radiculopathy, lumbar region: Secondary | ICD-10-CM | POA: Diagnosis not present

## 2024-05-03 DIAGNOSIS — M549 Dorsalgia, unspecified: Secondary | ICD-10-CM | POA: Diagnosis present

## 2024-05-03 DIAGNOSIS — Z79899 Other long term (current) drug therapy: Secondary | ICD-10-CM | POA: Diagnosis not present

## 2024-05-03 DIAGNOSIS — I10 Essential (primary) hypertension: Secondary | ICD-10-CM | POA: Diagnosis present

## 2024-05-03 DIAGNOSIS — F1090 Alcohol use, unspecified, uncomplicated: Secondary | ICD-10-CM | POA: Diagnosis not present

## 2024-05-03 DIAGNOSIS — E785 Hyperlipidemia, unspecified: Secondary | ICD-10-CM | POA: Diagnosis not present

## 2024-05-03 DIAGNOSIS — M9979 Connective tissue and disc stenosis of intervertebral foramina of abdomen and other regions: Secondary | ICD-10-CM

## 2024-05-03 DIAGNOSIS — M9973 Connective tissue and disc stenosis of intervertebral foramina of lumbar region: Secondary | ICD-10-CM | POA: Insufficient documentation

## 2024-05-03 DIAGNOSIS — M519 Unspecified thoracic, thoracolumbar and lumbosacral intervertebral disc disorder: Principal | ICD-10-CM

## 2024-05-03 LAB — CBC WITH DIFFERENTIAL/PLATELET
Abs Immature Granulocytes: 0.02 K/uL (ref 0.00–0.07)
Basophils Absolute: 0.1 K/uL (ref 0.0–0.1)
Basophils Relative: 1 %
Eosinophils Absolute: 0.6 K/uL — ABNORMAL HIGH (ref 0.0–0.5)
Eosinophils Relative: 7 %
HCT: 44.3 % (ref 39.0–52.0)
Hemoglobin: 14.3 g/dL (ref 13.0–17.0)
Immature Granulocytes: 0 %
Lymphocytes Relative: 28 %
Lymphs Abs: 2.3 K/uL (ref 0.7–4.0)
MCH: 29.4 pg (ref 26.0–34.0)
MCHC: 32.3 g/dL (ref 30.0–36.0)
MCV: 91.2 fL (ref 80.0–100.0)
Monocytes Absolute: 0.7 K/uL (ref 0.1–1.0)
Monocytes Relative: 9 %
Neutro Abs: 4.7 K/uL (ref 1.7–7.7)
Neutrophils Relative %: 55 %
Platelets: 221 K/uL (ref 150–400)
RBC: 4.86 MIL/uL (ref 4.22–5.81)
RDW: 14.7 % (ref 11.5–15.5)
WBC: 8.4 K/uL (ref 4.0–10.5)
nRBC: 0 % (ref 0.0–0.2)

## 2024-05-03 LAB — COMPREHENSIVE METABOLIC PANEL WITH GFR
ALT: 21 U/L (ref 0–44)
AST: 21 U/L (ref 15–41)
Albumin: 3.5 g/dL (ref 3.5–5.0)
Alkaline Phosphatase: 69 U/L (ref 38–126)
Anion gap: 9 (ref 5–15)
BUN: 17 mg/dL (ref 8–23)
CO2: 24 mmol/L (ref 22–32)
Calcium: 8.7 mg/dL — ABNORMAL LOW (ref 8.9–10.3)
Chloride: 102 mmol/L (ref 98–111)
Creatinine, Ser: 1.7 mg/dL — ABNORMAL HIGH (ref 0.61–1.24)
GFR, Estimated: 44 mL/min — ABNORMAL LOW (ref >60)
Glucose, Bld: 107 mg/dL — ABNORMAL HIGH (ref 70–99)
Potassium: 3.9 mmol/L (ref 3.5–5.1)
Sodium: 135 mmol/L (ref 135–145)
Total Bilirubin: 0.5 mg/dL (ref 0.0–1.2)
Total Protein: 6.1 g/dL — ABNORMAL LOW (ref 6.5–8.1)

## 2024-05-03 MED ORDER — KETOROLAC TROMETHAMINE 30 MG/ML IJ SOLN
30.0000 mg | Freq: Once | INTRAMUSCULAR | Status: AC
  Administered 2024-05-03: 30 mg via INTRAVENOUS
  Filled 2024-05-03: qty 1

## 2024-05-03 NOTE — ED Provider Notes (Incomplete)
 [facility] Provider Note    Event Date/Time   First MD Initiated Contact with Patient 05/03/24 1929     (approximate)   History   Back Pain    HPI  Benjamin Fields is a 63 y.o. male    with a past medical history of back pain, lumbar spondylosis, lower pneumonia, shortness of breath, neuropathy, depression and anxiety, psoriatic arthritis, flank pain, cervical myelopathy, shoulder and arm pain,, with no significant past medical history who presents to the ED complaining of back pain. According to the patient, symptoms are getting worse in the last week consistent in lower back pain, urinary incontinence x 1, urinary frequency, lower extremities weakness while walking.  Patient denies numbness or tingling, denies radiation of the pain to his lower legs.  No history of trauma.  Patient is utilizing a cane.  Patient states symptoms are worst in the morning, and at night.  Patient was seen by neurosurgery last week, he consulted today and they asked him to come to ED. patient had ordered an MRI for next Saturday.  Patient is taking Robaxin  and acetaminophen  for pain without resolution of the symptoms.  She is here with his wife     Patient Active Problem List   Diagnosis Date Noted   Lentigo 04/13/2024   Seborrheic keratoses 04/13/2024   Paresthesia 04/02/2024   Right foot drop 04/02/2024   Coronary artery disease involving native coronary artery of native heart without angina pectoris 04/02/2024   Lobar pneumonia (HCC) 03/02/2024   Epigastric pain 01/14/2024   Neuropathy 10/29/2023   Pruritus 10/29/2023   Effusion of right knee 01/17/2023   History of fusion of cervical spine 07/30/2022   Spinal stenosis of cervical region 07/30/2022   Cervical disc herniation 07/30/2022   Lateral epicondylitis of right elbow 12/04/2021   Snores 10/18/2021   Elevated triglycerides with high cholesterol 04/12/2021   Abdominal bloating 03/14/2021   Depression with anxiety  12/22/2020   Low back pain 02/17/2020   Left hip pain 02/17/2020   Healthcare maintenance 11/16/2019   Rheumatoid arthritis of multiple sites with negative rheumatoid factor (HCC) 05/19/2018   Tinea versicolor 05/19/2018   Essential hypertension 03/17/2018   DDD (degenerative disc disease), cervical 03/16/2018     ROS: Patient currently denies any vision changes, tinnitus, difficulty speaking, facial droop, sore throat, chest pain, shortness of breath, abdominal pain, nausea/vomiting/diarrhea, dysuria, or weakness/numbness/paresthesias in any extremity   Physical Exam   Triage Vital Signs: ED Triage Vitals  Encounter Vitals Group     BP 05/03/24 1719 130/81     Girls Systolic BP Percentile --      Girls Diastolic BP Percentile --      Boys Systolic BP Percentile --      Boys Diastolic BP Percentile --      Pulse Rate 05/03/24 1719 69     Resp 05/03/24 1719 18     Temp 05/03/24 1719 98.3 F (36.8 C)     Temp Source 05/03/24 1719 Oral     SpO2 05/03/24 1719 96 %     Weight 05/03/24 1717 180 lb (81.6 kg)     Height 05/03/24 1717 5' 10 (1.778 m)     Head Circumference --      Peak Flow --      Pain Score 05/03/24 1717 10     Pain Loc --      Pain Education --      Exclude from Growth Chart --  Most recent vital signs: Vitals:   05/03/24 1719  BP: 130/81  Pulse: 69  Resp: 18  Temp: 98.3 F (36.8 C)  SpO2: 96%     Physical Exam Vitals and nursing note reviewed.   Constitutional:      General: Awake and alert. No acute distress.    Appearance: Normal appearance. The patient is normal weight.      Able to speak in complete sentences without cough or dyspnea  HENT:     Head: Normocephalic and atraumatic.     Mouth: Mucous membranes are moist.  Eyes:     General: PERRL. Normal EOMs          Conjunctiva/sclera: Conjunctivae normal.  Nose No congestion/rhinorrhea  CV:                  Good peripheral perfusion.  Regular rate and rhythm  Resp:                Normal effort.  Equal breath sounds bilaterally.  Abd:                 No distention.  Soft, nontender.  No rebound or guarding.  Musculoskeletal:        General: No swelling. Normal range of motion.  Lumbar sacral spine: Tenderness to palpation in the sacral process.  No paraspinal muscles tenderness.  Negative bilateral SLR.  Strength 5/5.  Pulses positive.  Sensation is intact.  No signs of cauda equina. Skin:    General: Skin is warm and dry.     Capillary Refill: Capillary refill takes less than 2 seconds.     Findings: No rash.  Neurological:     Mental Status: The patient is awake and alert. MAE spontaneously. No gross focal neurologic deficits are appreciated.  Psychiatric Mood and affect are normal. Speech and behavior are normal.  ED Results / Procedures / Treatments   Labs (all labs ordered are listed, but only abnormal results are displayed) Labs Reviewed - No data to display   EKG See physician read    RADIOLOGY I independently reviewed and interpreted imaging and agree with radiologists findings.      PROCEDURES:  Critical Care performed:   Procedures   MEDICATIONS ORDERED IN ED: Medications - No data to display    IMPRESSION / MDM / ASSESSMENT AND PLAN / ED COURSE  I reviewed the triage vital signs and the nursing notes.  Differential diagnosis includes, but is not limited to, cauda equina syndrome, spondylolysis, spondylolisthesis, unlikely abscess  Patient's presentation is most consistent with acute illness / injury with system symptoms.    Benjamin Fields is a 63 y.o., male who presents today with history of 1 week worsening back pain with 1 episode of urinary incontinence, urinary frequency, no dysuria, no chills no fever.  During physical exam there is tenderness to palpation sacral process, no paraspinal muscles pain, no signs of saddle anesthesia, strength 5 out of 5, sensation is intact, pulses positive.  Negative bilateral SLR.  Rest of  physical exam is normal Plan Toradol  IV CBC, CMP Bladder scan Lidocaine  patch Lumbar MRI without contrast Consult neurosurgery Reassess Patient's diagnosis is consistent with ***. I independently reviewed and interpreted imaging and agree with radiologists findings. Labs are  reassuring***. I did review the patient's allergies and medications.The patient is in stable and satisfactory condition for discharge home  Patient will be discharged home with prescriptions for ***. Patient is to follow up with *** as needed  or otherwise directed. Patient is given ED precautions to return to the ED for any worsening or new symptoms. Discussed plan of care with patient, answered all of patient's questions, Patient agreeable to plan of care. Advised patient to take medications according to the instructions on the label. Discussed possible side effects of new medications. Patient verbalized understanding.   FINAL CLINICAL IMPRESSION(S) / ED DIAGNOSES   Final diagnoses:  None     Rx / DC Orders   ED Discharge Orders     None        Note:  This document was prepared using Dragon voice recognition software and may include unintentional dictation errors.

## 2024-05-03 NOTE — ED Notes (Signed)
 Pt requested to ambulate to the restroom, ambulated with steady gait.

## 2024-05-03 NOTE — ED Notes (Signed)
 Neurosurgery Provider at bedside.

## 2024-05-03 NOTE — ED Triage Notes (Signed)
 Pt ambulatory to triage.  Pt has lower back pain.  Pt reports urinary frequency.  No n/v/  no known injury to back  pt alert  speech clear

## 2024-05-03 NOTE — Telephone Encounter (Signed)
 Please call patient. If he is having new urinary incontinence, then he should go to the ED.

## 2024-05-04 ENCOUNTER — Telehealth: Payer: Self-pay | Admitting: Orthopedic Surgery

## 2024-05-04 ENCOUNTER — Encounter: Payer: Self-pay | Admitting: Internal Medicine

## 2024-05-04 ENCOUNTER — Observation Stay

## 2024-05-04 ENCOUNTER — Emergency Department

## 2024-05-04 DIAGNOSIS — M5416 Radiculopathy, lumbar region: Secondary | ICD-10-CM | POA: Diagnosis not present

## 2024-05-04 DIAGNOSIS — E785 Hyperlipidemia, unspecified: Secondary | ICD-10-CM | POA: Insufficient documentation

## 2024-05-04 DIAGNOSIS — I10 Essential (primary) hypertension: Secondary | ICD-10-CM

## 2024-05-04 DIAGNOSIS — M48061 Spinal stenosis, lumbar region without neurogenic claudication: Secondary | ICD-10-CM | POA: Diagnosis not present

## 2024-05-04 HISTORY — DX: Radiculopathy, lumbar region: M54.16

## 2024-05-04 HISTORY — DX: Hyperlipidemia, unspecified: E78.5

## 2024-05-04 LAB — CBC
HCT: 41.8 % (ref 39.0–52.0)
Hemoglobin: 13.9 g/dL (ref 13.0–17.0)
MCH: 29.6 pg (ref 26.0–34.0)
MCHC: 33.3 g/dL (ref 30.0–36.0)
MCV: 88.9 fL (ref 80.0–100.0)
Platelets: 219 K/uL (ref 150–400)
RBC: 4.7 MIL/uL (ref 4.22–5.81)
RDW: 14.8 % (ref 11.5–15.5)
WBC: 8.6 K/uL (ref 4.0–10.5)
nRBC: 0 % (ref 0.0–0.2)

## 2024-05-04 LAB — BASIC METABOLIC PANEL WITH GFR
Anion gap: 11 (ref 5–15)
BUN: 20 mg/dL (ref 8–23)
CO2: 23 mmol/L (ref 22–32)
Calcium: 9 mg/dL (ref 8.9–10.3)
Chloride: 106 mmol/L (ref 98–111)
Creatinine, Ser: 1.68 mg/dL — ABNORMAL HIGH (ref 0.61–1.24)
GFR, Estimated: 45 mL/min — ABNORMAL LOW (ref >60)
Glucose, Bld: 126 mg/dL — ABNORMAL HIGH (ref 70–99)
Potassium: 4.3 mmol/L (ref 3.5–5.1)
Sodium: 140 mmol/L (ref 135–145)

## 2024-05-04 LAB — HIV ANTIBODY (ROUTINE TESTING W REFLEX): HIV Screen 4th Generation wRfx: NONREACTIVE

## 2024-05-04 MED ORDER — INFLUENZA VAC SPLIT HIGH-DOSE 0.5 ML IM SUSY
0.5000 mL | PREFILLED_SYRINGE | INTRAMUSCULAR | Status: DC
  Filled 2024-05-04: qty 0.5

## 2024-05-04 MED ORDER — TRAZODONE HCL 50 MG PO TABS: 25.0000 mg | ORAL_TABLET | Freq: Every evening | ORAL | Status: DC | PRN

## 2024-05-04 MED ORDER — LOSARTAN POTASSIUM 25 MG PO TABS
25.0000 mg | ORAL_TABLET | Freq: Every day | ORAL | Status: DC
  Administered 2024-05-04 – 2024-05-05 (×2): 25 mg via ORAL
  Filled 2024-05-04 (×2): qty 1

## 2024-05-04 MED ORDER — MORPHINE SULFATE (PF) 2 MG/ML IV SOLN: 2.0000 mg | INTRAVENOUS | Status: DC | PRN

## 2024-05-04 MED ORDER — DULOXETINE HCL 30 MG PO CPEP
60.0000 mg | ORAL_CAPSULE | Freq: Every day | ORAL | Status: DC
  Administered 2024-05-04 – 2024-05-05 (×2): 60 mg via ORAL
  Filled 2024-05-04: qty 1
  Filled 2024-05-04: qty 2

## 2024-05-04 MED ORDER — GABAPENTIN 400 MG PO CAPS
400.0000 mg | ORAL_CAPSULE | Freq: Every day | ORAL | Status: DC
  Administered 2024-05-04: 400 mg via ORAL
  Filled 2024-05-04: qty 1

## 2024-05-04 MED ORDER — ACETAMINOPHEN 325 MG PO TABS: 650.0000 mg | ORAL_TABLET | Freq: Four times a day (QID) | ORAL | Status: DC | PRN

## 2024-05-04 MED ORDER — ONDANSETRON HCL 4 MG PO TABS: 4.0000 mg | ORAL_TABLET | Freq: Four times a day (QID) | ORAL | Status: DC | PRN

## 2024-05-04 MED ORDER — PREDNISONE 20 MG PO TABS
60.0000 mg | ORAL_TABLET | Freq: Once | ORAL | Status: AC
  Administered 2024-05-04: 60 mg via ORAL
  Filled 2024-05-04: qty 3

## 2024-05-04 MED ORDER — SODIUM CHLORIDE 0.9 % IV SOLN: INTRAVENOUS | Status: DC

## 2024-05-04 MED ORDER — ALBUTEROL SULFATE (2.5 MG/3ML) 0.083% IN NEBU: 2.5000 mg | INHALATION_SOLUTION | Freq: Four times a day (QID) | RESPIRATORY_TRACT | Status: DC | PRN

## 2024-05-04 MED ORDER — PNEUMOCOCCAL 20-VAL CONJ VACC 0.5 ML IM SUSY: 0.5000 mL | PREFILLED_SYRINGE | INTRAMUSCULAR | Status: DC

## 2024-05-04 MED ORDER — ATORVASTATIN CALCIUM 10 MG PO TABS
10.0000 mg | ORAL_TABLET | Freq: Every day | ORAL | Status: DC
  Administered 2024-05-04 – 2024-05-05 (×2): 10 mg via ORAL
  Filled 2024-05-04 (×2): qty 1

## 2024-05-04 MED ORDER — MAGNESIUM HYDROXIDE 400 MG/5ML PO SUSP: 30.0000 mL | Freq: Every day | ORAL | Status: DC | PRN

## 2024-05-04 MED ORDER — ACETAMINOPHEN 650 MG RE SUPP: 650.0000 mg | Freq: Four times a day (QID) | RECTAL | Status: DC | PRN

## 2024-05-04 MED ORDER — METHOCARBAMOL 500 MG PO TABS: 500.0000 mg | ORAL_TABLET | Freq: Three times a day (TID) | ORAL | Status: DC | PRN

## 2024-05-04 MED ORDER — ENOXAPARIN SODIUM 40 MG/0.4ML IJ SOSY
40.0000 mg | PREFILLED_SYRINGE | INTRAMUSCULAR | Status: DC
  Administered 2024-05-04 – 2024-05-05 (×2): 40 mg via SUBCUTANEOUS
  Filled 2024-05-04 (×2): qty 0.4

## 2024-05-04 MED ORDER — ONDANSETRON HCL 4 MG/2ML IJ SOLN: 4.0000 mg | Freq: Four times a day (QID) | INTRAMUSCULAR | Status: DC | PRN

## 2024-05-04 MED ORDER — GABAPENTIN 100 MG PO CAPS
100.0000 mg | ORAL_CAPSULE | Freq: Every morning | ORAL | Status: DC
  Administered 2024-05-04 – 2024-05-05 (×2): 100 mg via ORAL
  Filled 2024-05-04 (×2): qty 1

## 2024-05-04 MED ORDER — ADULT MULTIVITAMIN W/MINERALS CH
1.0000 | ORAL_TABLET | Freq: Every day | ORAL | Status: DC
  Administered 2024-05-04 – 2024-05-05 (×2): 1 via ORAL
  Filled 2024-05-04 (×2): qty 1

## 2024-05-04 MED ORDER — BUTALBITAL-APAP-CAFFEINE 50-325-40 MG PO TABS
1.0000 | ORAL_TABLET | Freq: Four times a day (QID) | ORAL | Status: DC | PRN
  Administered 2024-05-04: 1 via ORAL
  Filled 2024-05-04: qty 1

## 2024-05-04 MED ORDER — ENSURE PLUS HIGH PROTEIN PO LIQD
237.0000 mL | Freq: Two times a day (BID) | ORAL | Status: DC
  Administered 2024-05-05: 237 mL via ORAL

## 2024-05-04 MED ORDER — DEXAMETHASONE SODIUM PHOSPHATE 10 MG/ML IJ SOLN
6.0000 mg | INTRAMUSCULAR | Status: DC
  Administered 2024-05-04 – 2024-05-05 (×2): 6 mg via INTRAVENOUS
  Filled 2024-05-04: qty 1
  Filled 2024-05-04: qty 0.6

## 2024-05-04 NOTE — Assessment & Plan Note (Addendum)
-  Patient's diagnosis is consistent with L L5-S1 foraminal stenosis, impinging of S1 without cord compression. - This is likely the culprit for her worsening low back pain and lumbar radiculopathy. - He would be admitted to a medical-surgical observation bed. - Pain management will be provided. - Will continue steroid therapy. - Neurosurgery consult will be obtained. - Dr. Clois was notified about the patient. - Will continue Neurontin  and Cymbalta  as well as muscle relaxant.

## 2024-05-04 NOTE — Consult Note (Signed)
 Chief Complaint: Patient was seen in consultation today for lower back pain with associated radiation to legs, with weakness, and with consideration for epidural steroid injection vs. targeted nerve root block.   Referring Provider(s): Dr. Reeves Daisy, MD   Supervising Physician: Karalee Beat  Patient Status: [facility] - ED  Patient is Full Code  History of Present Illness: Benjamin Fields is a 63 y.o. male  with PMHx notable for right leg neuropathy, HTN, and RA.  Patient has a baseline of RLE radiculopathy, for which he is prescribed Gabapentin . Per patient report, he began with worsening lower back pain mid last week, with notable radiation to his left leg, with weakness. He endorses one episode of enuresis on Saturday. Over the course of the last 5 days, his pain, weakness, and muscle soreness has progressed bilaterally, with radiation to bilateral legs down into the calves. This is worse at night, and with extended periods of standing and sitting. Today, patient unusually notes his lumbosacral region is focally tender as well (mainly sacral), worsened by the MRI, with notable pain and radiation down his right leg, greater than his left.   MR lumbar spine w/o cm on 05/03/24: IMPRESSION: 1. Right subarticular disc extrusion at L5-S1, impinging upon the descending right S1 nerve root as it courses through the right lateral recess. Disc material also closely approximates the descending left S1 nerve root, also potentially affected. 2. Small central to left subarticular disc protrusion at L2-3 with resultant mild bilateral lateral recess stenosis. 3. Mild disc bulging with facet hypertrophy at L4-5 with resultant mild bilateral L4 foraminal stenosis.  Interventional Radiology was requested for ESI. Request was reviewed and approved by Dr. Karalee. Patient is tentatively scheduled for same in IR tomorrow.   Patient is alert and sitting up in chair, calm. His wife is beside  him. Patient is currently complaining of bilateral leg weakness, soreness, and pain, worst on right side. Patient denies any fevers, headache, chest pain, SOB, cough, abdominal pain, nausea, vomiting or bleeding.    Past Medical History:  Diagnosis Date   Allergy     mild- no meds    Anxiety 03/09/18   Depression 03/09/2018   Hypertension 01/08/16   Neuromuscular disorder (HCC) 05/30/2018   mild nerve issue right leg    RA (rheumatoid arthritis) [facility])     Past Surgical History:  Procedure Laterality Date   CERVICAL SPINE SURGERY  2010   COLONOSCOPY     COLONOSCOPY  04/2020   ROTATOR CUFF REPAIR Right    SPINE SURGERY  12/2004   TONSILLECTOMY      Allergies: Patient has no known allergies.  Medications: Prior to Admission medications   Medication Sig Start Date End Date Taking? Authorizing Provider  albuterol  (VENTOLIN  HFA) 108 (90 Base) MCG/ACT inhaler Inhale 2 puffs into the lungs every 6 (six) hours as needed for wheezing or shortness of breath. 04/13/24  Yes Kasa, Kurian, MD  atorvastatin  (LIPITOR) 10 MG tablet Take 1 tablet (10 mg total) by mouth daily. 04/02/24  Yes Wendee Lynwood HERO, NP  DULoxetine  (CYMBALTA ) 60 MG capsule Take 1 capsule (60 mg total) by mouth daily. 04/05/24  Yes Wendee Lynwood HERO, NP  gabapentin  (NEURONTIN ) 100 MG capsule Take 1 capsule (100mg ) in the am and take 4 capsules (400mg ) QHS 03/04/24  Yes Wendee Lynwood HERO, NP  losartan  (COZAAR ) 25 MG tablet Take 1 tablet (25 mg total) by mouth daily. 08/08/23  Yes Wendee Lynwood HERO, NP  methocarbamol  (ROBAXIN ) 500  MG tablet Take 1 tablet (500 mg total) by mouth every 8 (eight) hours as needed for muscle spasms. Can cause drowsiness. 04/29/24  Yes Hilma Hastings, PA-C  Multiple Vitamin (MULTIVITAMIN) tablet Take 1 tablet by mouth daily. OTC   Yes [provider]  amoxicillin -clavulanate (AUGMENTIN ) 875-125 MG tablet Take 1 tablet by mouth 2 (two) times daily. 10 days Patient not taking: Reported on 05/04/2024 04/13/24   Isaiah Scrivener, MD     Family History  Problem Relation Age of Onset   Parkinson's disease Mother    Atrial fibrillation Mother    Arthritis Mother    Depression Mother    Heart failure Father    Hypertension Father    Prostate cancer Father 55   COPD Father    Heart disease Father    Colon cancer Neg Hx    Colon polyps Neg Hx    Esophageal cancer Neg Hx    Rectal cancer Neg Hx    Stomach cancer Neg Hx     Social History   Socioeconomic History   Marital status: Married    Spouse name: Administrator, arts   Number of children: Not on file   Years of education: Not on file   Highest education level: Master's degree (e.g., MA, MS, MEng, MEd, MSW, MBA)  Occupational History   Not on file  Tobacco Use   Smoking status: Never   Smokeless tobacco: Never  Vaping Use   Vaping status: Never Used  Substance and Sexual Activity   Alcohol use: Yes    Comment: rare wine during the holiday   Drug use: Never   Sexual activity: Yes  Other Topics Concern   Not on file  Social History Narrative   Lives home with wife.  workd at [employer]  Education: Masters.  Children 2.  Caffeine  2 cups daily      Gabreial (28)   Grant (30)      Works at [employer] work    Social Drivers of [employer]: Low Risk  (03/01/2024)   Overall Financial Resource Strain (CARDIA)    Difficulty of Paying Living Expenses: Not very hard  Recent Concern: Financial Resource Strain - Medium Risk (01/11/2024)   Overall Financial Resource Strain (CARDIA)    Difficulty of Paying Living Expenses: Somewhat hard  Food Insecurity: No Food Insecurity (05/04/2024)   Hunger Vital Sign    Worried About Running Out of Food in the Last Year: Never true    Ran Out of Food in the Last Year: Never true  Transportation Needs: No Transportation Needs (05/04/2024)   PRAPARE - Administrator, Civil Service (Medical): No    Lack of Transportation (Non-Medical): No  Physical Activity: Insufficiently Active  (03/01/2024)   Exercise Vital Sign    Days of Exercise per Week: 1 day    Minutes of Exercise per Session: 20 min  Stress: Stress Concern Present (03/01/2024)   [employer] of Occupational Health - Occupational Stress Questionnaire    Feeling of Stress: Rather much  Social Connections: Socially Integrated (05/04/2024)   Social Connection and Isolation Panel    Frequency of Communication with Friends and Family: Twice a week    Frequency of Social Gatherings with Friends and Family: Once a week    Attends Religious Services: More than 4 times per year    Active Member of [employer] or Organizations: Yes    Attends Banker Meetings: More than 4 times per year  Marital Status: Married     Review of Systems: A 12 point ROS discussed and pertinent positives are indicated in the HPI above.  All other systems are negative.  Vital Signs: BP (!) 140/89 (BP Location: Left Arm)   Pulse 89   Temp 98.2 F (36.8 C) (Oral)   Resp 17   Ht 5' 10 (1.778 m)   Wt 180 lb (81.6 kg)   SpO2 95%   BMI 25.83 kg/m   Advance Care Plan: The advanced care place/surrogate decision maker was discussed at the time of visit and the patient did not wish to discuss or was not able to name a surrogate decision maker or provide an advance care plan.  Physical Exam Constitutional:      Appearance: Normal appearance.  Cardiovascular:     Rate and Rhythm: Normal rate and regular rhythm.  Pulmonary:     Effort: Pulmonary effort is normal.  Musculoskeletal:        General: Normal range of motion.       Arms:     Comments: Sacral tenderness to palpation, with tenderness radiating to bilateral hips and legs, notably worse on right side.  4/5 bilateral lower extremity strength.  Equal and normal sensation bilaterally.   Skin:    General: Skin is warm and dry.  Neurological:     Mental Status: He is alert and oriented to person, place, and time.     Imaging: MR CERVICAL SPINE WO  CONTRAST Addendum Date: 05/04/2024 ADDENDUM REPORT: 05/04/2024 12:37 ADDENDUM: Dr. REEVES DAISY aware of salient findings on this exam at 12:35 pm on 05/04/2024. Electronically Signed   By: VEAR Hurst M.D.   On: 05/04/2024 12:37   Result Date: 05/04/2024 CLINICAL DATA:  63 year old male with back pain and weakness. Multilobar pneumonia last month. EXAM: MRI CERVICAL SPINE WITHOUT CONTRAST TECHNIQUE: Multiplanar, multisequence MR imaging of the cervical spine was performed. No intravenous contrast was administered. COMPARISON:  CT cervical spine 10/01/2022. Thoracic MRI today reported separately. FINDINGS: Alignment: Similar straightening of cervical lordosis to the CT last year. Mild degenerative anterolisthesis of C3 on C4. Vertebrae: Chronic C4-C5 and C5-C6 ACDF. Mild hardware susceptibility artifact. Normal background bone marrow signal. No marrow edema or evidence of acute osseous abnormality. Cord: Abnormal. Multifactorial moderate degenerative cord compression at C6-C7 (series 20, image 29 and series 17, image 8) and possible faint spinal cord myelomalacia just below that level (series 20 image 30 right hemicord). No cord edema or expansion. Less pronounced mild to moderate degenerative spinal cord mass effect at C3-C4. See additional details of both levels below. Posterior Fossa, vertebral arteries, paraspinal tissues: Cervicomedullary junction is within normal limits. Negative visible posterior fossa. Preserved major vascular flow voids in the bilateral neck. Negative visible neck soft tissues, lung apices. Disc levels: C2-C3:  Negative. C3-C4: Mild anterolisthesis. Posterior disc space loss. Asymmetric circumferential disc osteophyte complex and moderate to severe facet hypertrophy on the right. Moderate ligament flavum hypertrophy. Moderate spinal stenosis with mild to moderate spinal cord mass effect (series 20, images 14 and 15). Moderate to severe bilateral C4 foraminal stenosis. C4-C5:  Prior ACDF.   Solid arthrodesis here by CT last year. C5-C6: Prior ACDF. Suggestion of pseudoarthrosis here by CT last year. Bulky rightward disc osteophyte complex affecting the right neural foramen. No significant spinal stenosis. But moderate right C6 neural foraminal stenosis. C6-C7: Disc space loss. Circumferential disc osteophyte complex with a broad-based posterior component and severe ligament flavum hypertrophy. Mild to moderate facet hypertrophy. Moderate spinal  stenosis and spinal cord mass effect. Moderate to severe C7 neural foraminal stenosis perhaps greater on the left. C7-T1: Minor disc bulging. Mild to moderate facet and ligament flavum hypertrophy. No stenosis. Thoracic MRI today reported separately. IMPRESSION: 1. Adjacent segment disease at C6-C7 with Moderate spinal stenosis AND spinal cord mass effect. Possible faint myelomalacia at that level. No cord edema. 2. Adjacent segment disease at C3-C4 with moderate spinal stenosis, mild to moderate cord mass effect greater on the right. No cord signal changes. 3. Underlying chronic C4-C5 and C5-C6 ACDF. Possible pseudoarthrosis at the latter by CT last year. 4. Moderate to severe bilateral C4 and C7, moderate right C6 neural foraminal stenosis associated with the above. Electronically Signed: By: VEAR Hurst M.D. On: 05/04/2024 12:26   MR THORACIC SPINE WO CONTRAST Result Date: 05/04/2024 CLINICAL DATA:  63 year old male with back pain and weakness. Multilobar pneumonia last month. EXAM: MRI THORACIC SPINE WITHOUT CONTRAST TECHNIQUE: Multiplanar, multisequence MR imaging of the thoracic spine was performed. No intravenous contrast was administered. COMPARISON:  Chest CT 04/13/2024. Cervical spine MRI today. Lumbar MRI yesterday. FINDINGS: Limited cervical spine imaging:  Reported separately today. Thoracic spine segmentation:  Normal on the CT last month. Alignment:  Maintained thoracic kyphosis, stable from CT last month. Vertebrae: Normal background bone marrow  signal. Maintained vertebral height. Sclerotic T4 vertebral body lesion with no marrow edema most compatible with benign bone island. No marrow edema or acute osseous abnormality elsewhere in the thoracic spine. Cord: Mild degenerative ventral cord mass effect in the lower thoracic spine, detailed below. Otherwise normal thoracic cord morphology. And maintained normal thoracic spinal cord signal. Conus medullaris appears normal at T12-L1. Paraspinal and other soft tissues: Abnormal bilateral lung parenchyma with increased involvement of the posterior right upper lobe compared to the last month (series 20, image 7). No pleural fluid. Negative visible mediastinum and upper abdominal viscera. Negative visualized posterior paraspinal soft tissues. Disc levels: Mild for age thoracic spine degeneration above T8. T8-T9: Disc desiccation and disc space loss. Circumferential disc bulge with small to moderate right paracentral disc (series 20, image 26). Mild spinal stenosis and ventral right cord mass effect. No foraminal stenosis. T9-T10: Similar disc desiccation and right paracentral disc or disc osteophyte. Borderline to mild spinal stenosis and right ventral cord mass effect. No foraminal stenosis. T10-T11: Similar disc space loss. Circumferential disc bulge with more central to left paracentral disc or disc osteophyte complex. Mild spinal stenosis and ventral cord mass effect. No foraminal stenosis. T11-T12 and T12-L1 are negative for age. IMPRESSION: 1. No acute osseous abnormality in the thoracic spine and generally mild for age thoracic spine degeneration. Exception: lower thoracic disc degeneration T8-T9 through T10-T11 with up to mild spinal stenosis and subtle ventral cord mass effect. No thoracic spinal cord signal abnormality. 2. Heterogeneous bilateral lung parenchyma, nonspecific but these images suggest increased involvement of the posterior right upper lobe compared to the Chest CT 04/13/2024. Dr. REEVES DAISY aware of salient findings at 12:35 pm on 05/04/2024. Electronically Signed   By: VEAR Hurst M.D.   On: 05/04/2024 12:36   CT LUMBAR SPINE WO CONTRAST Result Date: 05/04/2024 EXAM: CT OF THE LUMBAR SPINE WITHOUT CONTRAST 05/04/2024 12:39:09 AM TECHNIQUE: CT of the lumbar spine was performed without the administration of intravenous contrast. Multiplanar reformatted images are provided for review. Automated exposure control, iterative reconstruction, and/or weight based adjustment of the mA/kV was utilized to reduce the radiation dose to as low as reasonably achievable. COMPARISON: MRI 05/03/2024 CLINICAL  HISTORY: Low back pain, symptoms persist with > 6 wks treatment. 63 year old male with worsening low back pain and an episode of urinary incontinence which is new for patient. Had been seen by neurosurgery about 4 days ago who had considered an outpatient MRI which was not performed. Reportedly reassuring exam here, recommended MRI which was performed and showed foraminal stenosis and impingement of S1 no apparent cord compression. Neurosurgery consulted who evaluated patient and recommended admission for pain control, CT imaging, and possible procedural intervention. FINDINGS: BONES AND ALIGNMENT: No acute fracture or traumatic malalignment. DEGENERATIVE CHANGES: Mild multilevel spondylosis. Mild degenerative disc disease at L5-S1. SOFT TISSUES: No acute abnormality. SPINAL CANAL AND NEURAL FORAMINA: No severe spinal canal narrowing. Mild neural foraminal narrowing on the left at L4-L5 and L5-S1. Severe neural foraminal narrowing on the right at L5-S1. IMPRESSION: 1. Severe neural foraminal narrowing on the right at L5-S1. Electronically signed by: Norman Gatlin MD 05/04/2024 12:52 AM EDT RP Workstation: [id number]   MR Lumbar Spine Wo Contrast Result Date: 05/03/2024 CLINICAL DATA:  Initial evaluation for acute low back pain, urinary incontinence. EXAM: MRI LUMBAR SPINE WITHOUT CONTRAST TECHNIQUE:  Multiplanar, multisequence MR imaging of the lumbar spine was performed. No intravenous contrast was administered. COMPARISON:  Prior CT from 05/28/2023 FINDINGS: Segmentation: Standard. Lowest well-formed disc space labeled the L5-S1 level. Alignment: Straightening with mild reversal of the normal lumbar lordosis. Trace degenerative retrolisthesis of L1 on L2, L2 on L3 and L5 on S1. Vertebrae: Vertebral body height maintained without acute or chronic fracture. Bone marrow signal intensity within normal limits. No discrete or worrisome osseous lesions. Mild degenerative reactive endplate changes present about the L5-S1 interspace. No other abnormal marrow edema. Conus medullaris and cauda equina: Conus extends to the T12 level. Conus and cauda equina appear normal. Paraspinal and other soft tissues: Paraspinous soft tissues within normal limits. 3.3 cm simple left renal cyst, benign in appearance, no follow-up imaging recommended. Disc levels: L1-2: Degenerative intervertebral disc space narrowing with disc desiccation and diffuse disc bulge. Mild reactive endplate spurring. No spinal stenosis. Foramina remain patent paired L2-3: Degenerative intervertebral disc space narrowing with diffuse disc bulge and disc desiccation. Superimposed small central to left subarticular disc protrusion with annular fissure and slight inferior migration. Mild facet hypertrophy. Resultant mild narrowing of the lateral recesses bilaterally. Central canal remains patent. No significant foraminal stenosis. L3-4: Negative interspace. Mild bilateral facet hypertrophy. No spinal stenosis. Foramina remain patent. L4-5: Disc desiccation with mild disc bulge. Mild bilateral facet hypertrophy. No spinal stenosis. Mild bilateral L4 foraminal narrowing. L5-S1: Degenerative intervertebral disc space narrowing with disc desiccation and diffuse disc bulge. Reactive endplate spurring. Superimposed right subarticular disc extrusion impinges upon the  descending right S1 nerve root as it courses through the right lateral recess (series 8, image 29). Disc material also closely approximates the descending left S1 nerve root, also potentially affected. Superimposed mild facet spurring. Resultant moderate right lateral recess stenosis. Central canal remains patent. Moderate bilateral L5 foraminal narrowing. IMPRESSION: 1. Right subarticular disc extrusion at L5-S1, impinging upon the descending right S1 nerve root as it courses through the right lateral recess. Disc material also closely approximates the descending left S1 nerve root, also potentially affected. 2. Small central to left subarticular disc protrusion at L2-3 with resultant mild bilateral lateral recess stenosis. 3. Mild disc bulging with facet hypertrophy at L4-5 with resultant mild bilateral L4 foraminal stenosis. Electronically Signed   By: Morene Hoard M.D.   On: 05/03/2024 21:13   CT CHEST  WO CONTRAST Result Date: 04/13/2024 CLINICAL DATA:  Pneumonia complication suspected. EXAM: CT CHEST WITHOUT CONTRAST TECHNIQUE: Multidetector CT imaging of the chest was performed following the standard protocol without IV contrast. RADIATION DOSE REDUCTION: This exam was performed according to the departmental dose-optimization program which includes automated exposure control, adjustment of the mA and/or kV according to patient size and/or use of iterative reconstruction technique. COMPARISON:  Chest CT dated 03/12/2024. FINDINGS: Evaluation of this exam is limited in the absence of intravenous contrast. Cardiovascular: There is no cardiomegaly or pericardial effusion. There is coronary vascular calcification. Mild atherosclerotic calcification of the thoracic aorta. No aneurysmal dilatation. The central pulmonary arteries are grossly unremarkable Mediastinum/Nodes: No hilar adenopathy. Subcarinal lymph node measures approximately 11 mm in short axis. Several additional subcentimeter paratracheal  lymph nodes noted. The soft tissues are grossly unremarkable. No mediastinal fluid collection. Lungs/Pleura: Bilateral patchy areas of consolidation with air bronchograms predominantly involving the lower lobes and lower lung fields. Interval decrease in the consolidation in the left lower lobe but increased in the size of the consolidation in the right lower and right middle lobes. New scattered nodular densities. No pleural effusion or pneumothorax. The central airways are patent. Upper Abdomen: No acute abnormality. Musculoskeletal: No acute osseous pathology. A sclerotic focus in T4 as seen previously, nonspecific. IMPRESSION: 1. Multilobar pneumonia with waxing and waning areas of consolidation. 2.  Aortic Atherosclerosis (ICD10-I70.0). Electronically Signed   By: Vanetta Chou M.D.   On: 04/13/2024 14:03    Labs:  CBC: Recent Labs    01/14/24 1538 03/07/24 1204 05/03/24 2015 05/04/24 0502  WBC 7.3 10.0 8.4 8.6  HGB 14.5 14.1 14.3 13.9  HCT 43.0 42.6 44.3 41.8  PLT 288.0 335 221 219    COAGS: No results for input(s): INR, APTT in the last 8760 hours.  BMP: Recent Labs    05/28/23 1802 01/14/24 1538 03/07/24 1204 05/03/24 2015 05/04/24 0502  NA 137 140 139 135 140  K 4.5 4.7 4.4 3.9 4.3  CL 106 104 104 102 106  CO2 24 29 25 24 23   GLUCOSE 148* 103* 111* 107* 126*  BUN 20 24* 21 17 20   CALCIUM  9.2 9.6 10.0 8.7* 9.0  CREATININE 1.39* 1.43 1.60* 1.70* 1.68*  GFRNONAA 57*  --  48* 44* 45*    LIVER FUNCTION TESTS: Recent Labs    05/28/23 1802 01/14/24 1538 05/03/24 2015  BILITOT 0.8 0.4 0.5  AST 20 11 21   ALT 29 15 21   ALKPHOS 54 81 69  PROT 7.3 6.2 6.1*  ALBUMIN 4.5 4.2 3.5    TUMOR MARKERS: No results for input(s): AFPTM, CEA, CA199, CHROMGRNA in the last 8760 hours.  Assessment and Plan: Patient with notable bilateral lower extremity pain down to the calf, worst on right side, with associated perceived weakness and soreness. Sacral tenderness  was aggravated by MRI imaging. Patient was evaluated for ESI vs. unilateral targeted nerve root block. However, due to patient's bilateral presentation, IR will proceed with ESI, as it should improve bilateral pain.   Patient will tentatively present for ESI in IR tomorrow.  No sedation required. Local anesthetic only. Patient may eat. All labs and medications are within acceptable parameters.  No pertinent allergies.   Risks and benefits discussed with the patient including bleeding, infection, damage to adjacent structures, dural puncture, nerve damage and the fact that the injection does not provide any, or only provides minimal, pain relief.  All of the patient's questions were answered, patient is agreeable  to proceed.  Consent signed and in chart.     Thank you for allowing our service to participate in ALEKSANDER EDMISTON 's care.  Electronically Signed: Carlin DELENA Griffon, PA-C   05/04/2024, 5:58 PM      I spent a total of 40 Minutes in face to face in clinical consultation, greater than 50% of which was counseling/coordinating care for lower back pain with associated radiation to legs, with weakness, and with consideration for epidural steroid injection vs. targeted nerve root block.

## 2024-05-04 NOTE — Telephone Encounter (Signed)
 Please see below message from the answering service.    Media Information  Document Information  AMB Correspondence  NEUROSURGERY ANSWERING SERVICE CALL  05/03/2024 08:18  Attached To:  Vicenta JINNY Deanna Georgette  Source Information  Default, Provider, MD

## 2024-05-04 NOTE — Progress Notes (Signed)
 I have reviewed the cervical and thoracic MRI scans.  I have right briefly reviewed this with patient.  He has worsening findings on the cervical spine MRI scan which could be contributing to his difficulty walking.  However, his right leg pain is since tolerated.  He will have an epidural steroid injection tomorrow.  I am hopeful that he will do better.  When he discharges, we will have him return to clinic for follow-up about his neck.

## 2024-05-04 NOTE — Progress Notes (Signed)
 Brief rounding note, same day as admission  HPI: Pt admitted earlier this AM for lumbar radiculopathy. See H&P for full HPI on admission & ED course.   Interval history: Pt seen in ED holding for a bed with wife present this AM on rounds.  He reports 7 to 8 out of 10 pain from back down legs, worse on the right.  Also has a headache from being NPO and lack on caffeine .  No other complaints.  He had just returned from MRI and had some pain radiate around his right side/trunk while laying flat for MRI.  General exam: awake, alert, no acute distress Respiratory system: CTAB, no wheezes, rales or rhonchi, normal respiratory effort. Cardiovascular system: normal S1/S2,  RRR, no JVD, murmurs, rubs, gallops, no pedal edema.   Gastrointestinal system: soft, NT, ND Central nervous system: A&O x4. no gross focal neurologic deficits, normal speech Psychiatry: normal mood, congruent affect, judgement and insight appear normal    A&P: as per H&P by Dr. Lawence, with any changes or additions as below:  --Case discussed with Neurosurgery (Dr. Ashby) and IR (Dr. Karalee) today. --Follow up MRI's of C and T spine --IR planning for epidural injection tomorrow --No need for NPO, will be local anesthetic only, confirmed with IR --Diet resumed today --Fioricet  PRN for headache      No charge

## 2024-05-04 NOTE — Assessment & Plan Note (Signed)
-   Will continue antihypertensive therapy.

## 2024-05-04 NOTE — Assessment & Plan Note (Signed)
 Will continue statin therapy

## 2024-05-04 NOTE — Consult Note (Signed)
 Consult requested by:  Janit PA  Consult requested for:  Weakness,  rule out cauda equina syndrome  Primary Physician:  Wendee Lynwood HERO, NP  History of Present Illness: 05/04/2024 Mr. Anchor Dwan is here today with a chief complaint of an episode of urinary incontinence on the date of presentation.  It sounds like he had difficulty making it to the bathroom.  He denies perineal numbness.  He denies distal weakness in his legs.  He does report difficulty walking and has to sit down due to his legs feeling weak.  His weakness primarily in his upper legs.  He does have some pain in his right buttock and down the back of his leg as well.  This been worsening for the past couple of weeks.  He presents today due to intractable pain and his episode of urinary incontinence.  Review of Systems:  A 10 point review of systems is negative, except for the pertinent positives and negatives detailed in the HPI.  Past Medical History: Past Medical History:  Diagnosis Date   Allergy     mild- no meds    Anxiety 03/09/18   Depression 03/09/2018   Hypertension 01/08/16   Neuromuscular disorder (HCC) 05/30/2018   mild nerve issue right leg    RA (rheumatoid arthritis) [facility])     Past Surgical History: Past Surgical History:  Procedure Laterality Date   CERVICAL SPINE SURGERY  2010   COLONOSCOPY     COLONOSCOPY  04/2020   ROTATOR CUFF REPAIR Right    SPINE SURGERY  12/2004   TONSILLECTOMY      Allergies: Allergies as of 05/03/2024   (No Known Allergies)    Medications: Current Meds  Medication Sig   albuterol  (VENTOLIN  HFA) 108 (90 Base) MCG/ACT inhaler Inhale 2 puffs into the lungs every 6 (six) hours as needed for wheezing or shortness of breath.   atorvastatin  (LIPITOR) 10 MG tablet Take 1 tablet (10 mg total) by mouth daily.   DULoxetine  (CYMBALTA ) 60 MG capsule Take 1 capsule (60 mg total) by mouth daily.   gabapentin  (NEURONTIN ) 100 MG capsule Take 1 capsule (100mg ) in the am and  take 4 capsules (400mg ) QHS   losartan  (COZAAR ) 25 MG tablet Take 1 tablet (25 mg total) by mouth daily.   methocarbamol  (ROBAXIN ) 500 MG tablet Take 1 tablet (500 mg total) by mouth every 8 (eight) hours as needed for muscle spasms. Can cause drowsiness.   Multiple Vitamin (MULTIVITAMIN) tablet Take 1 tablet by mouth daily. OTC    Social History: Social History   Tobacco Use   Smoking status: Never   Smokeless tobacco: Never  Vaping Use   Vaping status: Never Used  Substance Use Topics   Alcohol use: Yes    Comment: rare wine during the holiday   Drug use: Never    Family Medical History: Family History  Problem Relation Age of Onset   Parkinson's disease Mother    Atrial fibrillation Mother    Arthritis Mother    Depression Mother    Heart failure Father    Hypertension Father    Prostate cancer Father 15   COPD Father    Heart disease Father    Colon cancer Neg Hx    Colon polyps Neg Hx    Esophageal cancer Neg Hx    Rectal cancer Neg Hx    Stomach cancer Neg Hx     Physical Examination: Vitals:   05/04/24 0107 05/04/24 0455  BP:  126/82  Pulse:  75  Resp:  17  Temp: 97.6 F (36.4 C) 98 F (36.7 C)  SpO2:  97%    General: Patient is in no apparent distress. Attention to examination is appropriate.  Neck:   Supple.  Full range of motion.  Respiratory: Patient is breathing without any difficulty.   NEUROLOGICAL:     Awake, alert, oriented to person, place, and time.  Speech is clear and fluent.  Cranial Nerves: Pupils equal round and reactive to light.  Facial tone is symmetric.  Facial sensation is symmetric. Shoulder shrug is symmetric. Tongue protrusion is midline.  There is no pronator drift.  Strength: Side Biceps Triceps Deltoid Interossei Grip Wrist Ext. Wrist Flex.  R 5 5 5 5 5 5 5   L 5 5 5 5 5 5 5    Side Iliopsoas Quads Hamstring PF DF EHL  R 4+ 5 5 5 5 5   L 4+ 5 5 5 5 5    Reflexes are 1+ and symmetric at the biceps, triceps,  brachioradialis, patella and achilles.   Hoffman's is absent.   Bilateral upper and lower extremity sensation is intact to light touch.    No evidence of dysmetria noted.  Gait is untested.  SLR + on R   Medical Decision Making  Imaging: MR L spine 05/03/2024 IMPRESSION: 1. Right subarticular disc extrusion at L5-S1, impinging upon the descending right S1 nerve root as it courses through the right lateral recess. Disc material also closely approximates the descending left S1 nerve root, also potentially affected. 2. Small central to left subarticular disc protrusion at L2-3 with resultant mild bilateral lateral recess stenosis. 3. Mild disc bulging with facet hypertrophy at L4-5 with resultant mild bilateral L4 foraminal stenosis.     Electronically Signed   By: Morene Hoard M.D.   On: 05/03/2024 21:13    I have personally reviewed the images and agree with the above interpretation.  Assessment and Plan: Mr. Corning is a pleasant 63 y.o. male with right-sided lumbar radiculopathy without cauda equina syndrome.  He had 1 episode of what sounds like urge incontinence.  He has proximal motor weakness that is not explained by his lumbar spine MRI scan.  He has known history of cervical spine disease.  I have recommended cervical and thoracic MRI scan.  If this does not show a reason for his difficulty walking and weakness, I will likely recommend neurology consultation.  He is currently having significant pain and will be admitted for pain control.  I have recommended steroids, muscle relaxants, and judicious use of narcotics.  We will engage interventional radiology for possible L5-S1 epidural steroid injection to help with his radicular pain.  I will defer to the primary team regarding continued medications for his known neuropathy.    I have communicated my recommendations to the requesting physician and coordinated care to facilitate these  recommendations.     Kaushik Maul K. Clois MD, [facility] Neurosurgery

## 2024-05-04 NOTE — Telephone Encounter (Signed)
 Dr. Clois saw him in ED and he was admitted.

## 2024-05-04 NOTE — H&P (Signed)
 [place]   PATIENT NAME: Benjamin Fields    MR#:  [id number]  DATE OF BIRTH:  Feb 20, 1961  DATE OF ADMISSION:  05/03/2024  PRIMARY CARE PHYSICIAN: Wendee Lynwood HERO, NP   Patient is coming from: Home  REQUESTING/REFERRING PHYSICIAN: Ward, Josette SAILOR, DO  CHIEF COMPLAINT:   Chief Complaint  Patient presents with   Back Pain    HISTORY OF PRESENT ILLNESS:  Benjamin Fields is a 63 y.o. male with medical history significant for anxiety, depression and essential hypertension as well as rheumatoid arthritis, who presented to the emergency room with acute onset of worsening low back pain with radiation to both lower extremities and occasional mild tingling and numbness.  He denies any saddle anesthesia.  He had urinary incontinence once while asleep but denied any stool incontinence.  He has been having mild cough without wheezing or dyspnea.  No fever or chills.  No chest pain or palpitations.  No bleeding diathesis.  ED Course: When the patient came to the ER, signs were within normal except for BP 141/85.  Labs revealed a creatinine 1.7 close to previous levels and calcium  8.7.  CBC was normal. EKG as reviewed by me : None Imaging: Lumbar spine CT showed severe neuroforaminal narrowing on the right hip that L5-S1.  The patient was given 30 mg of IV Toradol  and 60 mg p.o. prednisone .  Contact was made with Dr. Katrina who recommended admission for pain control and CT of the lumbar spine for possible epidural injection.  The patient will be admitted to an observation medical bed for further evaluation and management. PAST MEDICAL HISTORY:   Past Medical History:  Diagnosis Date   Allergy     mild- no meds    Anxiety 03/09/18   Depression 03/09/2018   Hypertension 01/08/16   Neuromuscular disorder (HCC) 05/30/2018   mild nerve issue right leg    RA (rheumatoid arthritis) (HCC)     PAST SURGICAL HISTORY:   Past Surgical History:  Procedure Laterality Date   CERVICAL SPINE  SURGERY  2010   COLONOSCOPY     COLONOSCOPY  04/2020   ROTATOR CUFF REPAIR Right    SPINE SURGERY  12/2004   TONSILLECTOMY      SOCIAL HISTORY:   Social History   Tobacco Use   Smoking status: Never   Smokeless tobacco: Never  Substance Use Topics   Alcohol use: Yes    Comment: rare wine during the holiday    FAMILY HISTORY:   Family History  Problem Relation Age of Onset   Parkinson's disease Mother    Atrial fibrillation Mother    Arthritis Mother    Depression Mother    Heart failure Father    Hypertension Father    Prostate cancer Father 5   COPD Father    Heart disease Father    Colon cancer Neg Hx    Colon polyps Neg Hx    Esophageal cancer Neg Hx    Rectal cancer Neg Hx    Stomach cancer Neg Hx     DRUG ALLERGIES:  No Known Allergies  REVIEW OF SYSTEMS:   ROS As per history of present illness. All pertinent systems were reviewed above. Constitutional, HEENT, cardiovascular, respiratory, GI, GU, musculoskeletal, neuro, psychiatric, endocrine, integumentary and hematologic systems were reviewed and are otherwise negative/unremarkable except for positive findings mentioned above in the HPI.   MEDICATIONS AT HOME:   Prior to Admission medications   Medication Sig Start Date End  Date Taking? Authorizing Provider  albuterol  (VENTOLIN  HFA) 108 (90 Base) MCG/ACT inhaler Inhale 2 puffs into the lungs every 6 (six) hours as needed for wheezing or shortness of breath. 04/13/24  Yes Kasa, Kurian, MD  atorvastatin  (LIPITOR) 10 MG tablet Take 1 tablet (10 mg total) by mouth daily. 04/02/24  Yes Wendee Lynwood HERO, NP  DULoxetine  (CYMBALTA ) 60 MG capsule Take 1 capsule (60 mg total) by mouth daily. 04/05/24  Yes Wendee Lynwood HERO, NP  gabapentin  (NEURONTIN ) 100 MG capsule Take 1 capsule (100mg ) in the am and take 4 capsules (400mg ) QHS 03/04/24  Yes Wendee Lynwood HERO, NP  losartan  (COZAAR ) 25 MG tablet Take 1 tablet (25 mg total) by mouth daily. 08/08/23  Yes Wendee Lynwood HERO, NP   methocarbamol  (ROBAXIN ) 500 MG tablet Take 1 tablet (500 mg total) by mouth every 8 (eight) hours as needed for muscle spasms. Can cause drowsiness. 04/29/24  Yes Hilma Hastings, PA-C  Multiple Vitamin (MULTIVITAMIN) tablet Take 1 tablet by mouth daily. OTC   Yes [provider]  amoxicillin -clavulanate (AUGMENTIN ) 875-125 MG tablet Take 1 tablet by mouth 2 (two) times daily. 10 days Patient not taking: Reported on 05/04/2024 04/13/24   Kasa, Kurian, MD      VITAL SIGNS:  Blood pressure 126/82, pulse 75, temperature 98 F (36.7 C), temperature source Oral, resp. rate 17, height 5' 10 (1.778 m), weight 81.6 kg, SpO2 97%.  PHYSICAL EXAMINATION:  Physical Exam  GENERAL:  63 y.o.-year-old Caucasian male patient lying in the bed with no acute distress.  EYES: Pupils equal, round, reactive to light and accommodation. No scleral icterus. Extraocular muscles intact.  HEENT: Head atraumatic, normocephalic. Oropharynx and nasopharynx clear.  NECK:  Supple, no jugular venous distention. No thyroid  enlargement, no tenderness.  LUNGS: Normal breath sounds bilaterally, no wheezing, rales,rhonchi or crepitation. No use of accessory muscles of respiration.  CARDIOVASCULAR: Regular rate and rhythm, S1, S2 normal. No murmurs, rubs, or gallops.  ABDOMEN: Soft, nondistended, nontender. Bowel sounds present. No organomegaly or mass.  EXTREMITIES: No pedal edema, cyanosis, or clubbing.  NEUROLOGIC: Cranial nerves II through XII are intact. Muscle strength 5/5 in all extremities. Sensation intact. Gait not checked.  PSYCHIATRIC: The patient is alert and oriented x 3.  Normal affect and good eye contact. SKIN: No obvious rash, lesion, or ulcer.  Musculoskeletal: Negative straight leg raising test bilaterally. LABORATORY PANEL:   CBC Recent Labs  Lab 05/04/24 0502  WBC 8.6  HGB 13.9  HCT 41.8  PLT 219    ------------------------------------------------------------------------------------------------------------------  Chemistries  Recent Labs  Lab 05/03/24 2015 05/04/24 0502  NA 135 140  K 3.9 4.3  CL 102 106  CO2 24 23  GLUCOSE 107* 126*  BUN 17 20  CREATININE 1.70* 1.68*  CALCIUM  8.7* 9.0  AST 21  --   ALT 21  --   ALKPHOS 69  --   BILITOT 0.5  --    ------------------------------------------------------------------------------------------------------------------  Cardiac Enzymes No results for input(s): TROPONINI in the last 168 hours. ------------------------------------------------------------------------------------------------------------------  RADIOLOGY:  CT LUMBAR SPINE WO CONTRAST Result Date: 05/04/2024 EXAM: CT OF THE LUMBAR SPINE WITHOUT CONTRAST 05/04/2024 12:39:09 AM TECHNIQUE: CT of the lumbar spine was performed without the administration of intravenous contrast. Multiplanar reformatted images are provided for review. Automated exposure control, iterative reconstruction, and/or weight based adjustment of the mA/kV was utilized to reduce the radiation dose to as low as reasonably achievable. COMPARISON: MRI 05/03/2024 CLINICAL HISTORY: Low back pain, symptoms persist with > 6 wks  treatment. 63 year old male with worsening low back pain and an episode of urinary incontinence which is new for patient. Had been seen by neurosurgery about 4 days ago who had considered an outpatient MRI which was not performed. Reportedly reassuring exam here, recommended MRI which was performed and showed foraminal stenosis and impingement of S1 no apparent cord compression. Neurosurgery consulted who evaluated patient and recommended admission for pain control, CT imaging, and possible procedural intervention. FINDINGS: BONES AND ALIGNMENT: No acute fracture or traumatic malalignment. DEGENERATIVE CHANGES: Mild multilevel spondylosis. Mild degenerative disc disease at L5-S1. SOFT  TISSUES: No acute abnormality. SPINAL CANAL AND NEURAL FORAMINA: No severe spinal canal narrowing. Mild neural foraminal narrowing on the left at L4-L5 and L5-S1. Severe neural foraminal narrowing on the right at L5-S1. IMPRESSION: 1. Severe neural foraminal narrowing on the right at L5-S1. Electronically signed by: Norman Gatlin MD 05/04/2024 12:52 AM EDT RP Workstation: [id number]   MR Lumbar Spine Wo Contrast Result Date: 05/03/2024 CLINICAL DATA:  Initial evaluation for acute low back pain, urinary incontinence. EXAM: MRI LUMBAR SPINE WITHOUT CONTRAST TECHNIQUE: Multiplanar, multisequence MR imaging of the lumbar spine was performed. No intravenous contrast was administered. COMPARISON:  Prior CT from 05/28/2023 FINDINGS: Segmentation: Standard. Lowest well-formed disc space labeled the L5-S1 level. Alignment: Straightening with mild reversal of the normal lumbar lordosis. Trace degenerative retrolisthesis of L1 on L2, L2 on L3 and L5 on S1. Vertebrae: Vertebral body height maintained without acute or chronic fracture. Bone marrow signal intensity within normal limits. No discrete or worrisome osseous lesions. Mild degenerative reactive endplate changes present about the L5-S1 interspace. No other abnormal marrow edema. Conus medullaris and cauda equina: Conus extends to the T12 level. Conus and cauda equina appear normal. Paraspinal and other soft tissues: Paraspinous soft tissues within normal limits. 3.3 cm simple left renal cyst, benign in appearance, no follow-up imaging recommended. Disc levels: L1-2: Degenerative intervertebral disc space narrowing with disc desiccation and diffuse disc bulge. Mild reactive endplate spurring. No spinal stenosis. Foramina remain patent paired L2-3: Degenerative intervertebral disc space narrowing with diffuse disc bulge and disc desiccation. Superimposed small central to left subarticular disc protrusion with annular fissure and slight inferior migration. Mild facet  hypertrophy. Resultant mild narrowing of the lateral recesses bilaterally. Central canal remains patent. No significant foraminal stenosis. L3-4: Negative interspace. Mild bilateral facet hypertrophy. No spinal stenosis. Foramina remain patent. L4-5: Disc desiccation with mild disc bulge. Mild bilateral facet hypertrophy. No spinal stenosis. Mild bilateral L4 foraminal narrowing. L5-S1: Degenerative intervertebral disc space narrowing with disc desiccation and diffuse disc bulge. Reactive endplate spurring. Superimposed right subarticular disc extrusion impinges upon the descending right S1 nerve root as it courses through the right lateral recess (series 8, image 29). Disc material also closely approximates the descending left S1 nerve root, also potentially affected. Superimposed mild facet spurring. Resultant moderate right lateral recess stenosis. Central canal remains patent. Moderate bilateral L5 foraminal narrowing. IMPRESSION: 1. Right subarticular disc extrusion at L5-S1, impinging upon the descending right S1 nerve root as it courses through the right lateral recess. Disc material also closely approximates the descending left S1 nerve root, also potentially affected. 2. Small central to left subarticular disc protrusion at L2-3 with resultant mild bilateral lateral recess stenosis. 3. Mild disc bulging with facet hypertrophy at L4-5 with resultant mild bilateral L4 foraminal stenosis. Electronically Signed   By: Morene Hoard M.D.   On: 05/03/2024 21:13      IMPRESSION AND PLAN:  Assessment and Plan: * Lumbar  radiculopathy -Patient's diagnosis is consistent with L L5-S1 foraminal stenosis, impinging of S1 without cord compression. - This is likely the culprit for her worsening low back pain and lumbar radiculopathy. - He would be admitted to a medical-surgical observation bed. - Pain management will be provided. - Will continue steroid therapy. - Neurosurgery consult will be  obtained. - Dr. Clois was notified about the patient. - Will continue Neurontin  and Cymbalta  as well as muscle relaxant.  Dyslipidemia - Will continue statin therapy.  Essential hypertension - Will continue antihypertensive therapy.     DVT prophylaxis: SCDs. Advanced Care Planning:  Code Status: full code.  Neuro Family Communication:  The plan of care was discussed in details with the patient (and family). I answered all questions. The patient agreed to proceed with the above mentioned plan. Further management will depend upon hospital course. Disposition Plan: Back to previous home environment Consults called: Neurosurgery All the records are reviewed and case discussed with ED provider.  Status is: Observation  I certify that at the time of admission, it is my clinical judgment that the patient will require hospital care extending less than 2 midnights.                            Dispo: The patient is from: Home              Anticipated d/c is to: Home              Patient currently is not medically stable to d/c.              Difficult to place patient: No  Madison DELENA Peaches M.D on 05/04/2024 at 7:05 AM  [facility]   From 7 PM-7 AM, contact night-coverage [email or web address]  CC: Primary care physician; Wendee Lynwood HERO, NP

## 2024-05-05 ENCOUNTER — Observation Stay: Admitting: Radiology

## 2024-05-05 ENCOUNTER — Telehealth: Payer: Self-pay | Admitting: Neurosurgery

## 2024-05-05 DIAGNOSIS — M5416 Radiculopathy, lumbar region: Secondary | ICD-10-CM | POA: Diagnosis not present

## 2024-05-05 MED ORDER — LIDOCAINE HCL (PF) 1 % IJ SOLN
INTRAMUSCULAR | Status: AC
  Filled 2024-05-05: qty 30

## 2024-05-05 MED ORDER — IOHEXOL 180 MG/ML  SOLN
20.0000 mL | Freq: Once | INTRAMUSCULAR | Status: AC | PRN
  Administered 2024-05-05: 0.5 mL

## 2024-05-05 MED ORDER — LIDOCAINE HCL (PF) 1 % IJ SOLN
5.0000 mL | Freq: Once | INTRAMUSCULAR | Status: AC
  Administered 2024-05-05: 5 mL

## 2024-05-05 MED ORDER — METHYLPREDNISOLONE ACETATE 80 MG/ML IJ SUSP
INTRAMUSCULAR | Status: AC
  Filled 2024-05-05: qty 2

## 2024-05-05 MED ORDER — METHYLPREDNISOLONE ACETATE 80 MG/ML IJ SUSP
80.0000 mg | Freq: Once | INTRAMUSCULAR | Status: AC
  Administered 2024-05-05: 120 mg via INTRA_ARTICULAR
  Filled 2024-05-05: qty 1

## 2024-05-05 NOTE — Discharge Summary (Signed)
 Physician Discharge Summary   Benjamin Fields  male DOB: 01/01/1961  FMW:[id number]  PCP: Wendee Lynwood HERO, NP  Admit date: 05/03/2024 Discharge date: 05/05/2024  Admitted From: home Disposition:  home CODE STATUS: Full code   Hospital Course:  For full details, please see H&P, progress notes, consult notes and ancillary notes.  Briefly,  Benjamin Fields is a 63 y.o. male with medical history significant for essential hypertension as well as rheumatoid arthritis, who presented to the emergency room with acute onset of worsening low back pain with radiation to both lower extremities and occasional mild tingling and numbness.  He denies any saddle anesthesia.   * Lumbar radiculopathy -Patient's diagnosis is consistent with L L5-S1 foraminal stenosis, impinging of S1 without cord compression. --Neurosurgery consulted, rec epidural steroid injection, which pt received with IR at RIGHT L5-S1.  Pt was ambulatory and cleared by neurosurgery for discharge after the injection. --cont home gabapentin   --Outpatient f/u with neurosurgery  Dyslipidemia - continue statin therapy.   Essential hypertension - continue home regimen as below.   Unless noted above, medications under STOP list are ones pt was not taking PTA.  Discharge Diagnoses:  Principal Problem:   Lumbar radiculopathy Active Problems:   Essential hypertension   Dyslipidemia     Discharge Instructions:  Allergies as of 05/05/2024   No Known Allergies      Medication List     STOP taking these medications    amoxicillin -clavulanate 875-125 MG tablet Commonly known as: AUGMENTIN        TAKE these medications    albuterol  108 (90 Base) MCG/ACT inhaler Commonly known as: VENTOLIN  HFA Inhale 2 puffs into the lungs every 6 (six) hours as needed for wheezing or shortness of breath.   atorvastatin  10 MG tablet Commonly known as: LIPITOR Take 1 tablet (10 mg total) by mouth daily.   DULoxetine  60 MG  capsule Commonly known as: Cymbalta  Take 1 capsule (60 mg total) by mouth daily.   gabapentin  100 MG capsule Commonly known as: NEURONTIN  Take 1 capsule (100mg ) in the am and take 4 capsules (400mg ) QHS   losartan  25 MG tablet Commonly known as: COZAAR  Take 1 tablet (25 mg total) by mouth daily.   methocarbamol  500 MG tablet Commonly known as: ROBAXIN  Take 1 tablet (500 mg total) by mouth every 8 (eight) hours as needed for muscle spasms. Can cause drowsiness.   multivitamin tablet Take 1 tablet by mouth daily. OTC         Follow-up Information     Clois Fret, MD Follow up.   Specialty: Neurosurgery Contact information: [address] [place] [address] [phone]         Wendee Lynwood HERO, NP Follow up in 1 week(s).   Specialties: Nurse Practitioner, Family Medicine Contact information: [address] [place] [address] [phone]                 No Known Allergies   The results of significant diagnostics from this hospitalization (including imaging, microbiology, ancillary and laboratory) are listed below for reference.   Consultations:   Procedures/Studies: MR CERVICAL SPINE WO CONTRAST Addendum Date: 05/04/2024 ADDENDUM REPORT: 05/04/2024 12:37 ADDENDUM: Dr. FRET CLOIS aware of salient findings on this exam at 12:35 pm on 05/04/2024. Electronically Signed   By: VEAR Hurst M.D.   On: 05/04/2024 12:37   Result Date: 05/04/2024 CLINICAL DATA:  63 year old male with back pain and weakness. Multilobar pneumonia last month.  EXAM: MRI CERVICAL SPINE WITHOUT CONTRAST TECHNIQUE: Multiplanar, multisequence MR imaging of the cervical spine was performed. No intravenous contrast was administered. COMPARISON:  CT cervical spine 10/01/2022. Thoracic MRI today reported separately. FINDINGS: Alignment: Similar straightening of cervical lordosis to the CT last year. Mild degenerative anterolisthesis of C3 on C4. Vertebrae:  Chronic C4-C5 and C5-C6 ACDF. Mild hardware susceptibility artifact. Normal background bone marrow signal. No marrow edema or evidence of acute osseous abnormality. Cord: Abnormal. Multifactorial moderate degenerative cord compression at C6-C7 (series 20, image 29 and series 17, image 8) and possible faint spinal cord myelomalacia just below that level (series 20 image 30 right hemicord). No cord edema or expansion. Less pronounced mild to moderate degenerative spinal cord mass effect at C3-C4. See additional details of both levels below. Posterior Fossa, vertebral arteries, paraspinal tissues: Cervicomedullary junction is within normal limits. Negative visible posterior fossa. Preserved major vascular flow voids in the bilateral neck. Negative visible neck soft tissues, lung apices. Disc levels: C2-C3:  Negative. C3-C4: Mild anterolisthesis. Posterior disc space loss. Asymmetric circumferential disc osteophyte complex and moderate to severe facet hypertrophy on the right. Moderate ligament flavum hypertrophy. Moderate spinal stenosis with mild to moderate spinal cord mass effect (series 20, images 14 and 15). Moderate to severe bilateral C4 foraminal stenosis. C4-C5:  Prior ACDF.  Solid arthrodesis here by CT last year. C5-C6: Prior ACDF. Suggestion of pseudoarthrosis here by CT last year. Bulky rightward disc osteophyte complex affecting the right neural foramen. No significant spinal stenosis. But moderate right C6 neural foraminal stenosis. C6-C7: Disc space loss. Circumferential disc osteophyte complex with a broad-based posterior component and severe ligament flavum hypertrophy. Mild to moderate facet hypertrophy. Moderate spinal stenosis and spinal cord mass effect. Moderate to severe C7 neural foraminal stenosis perhaps greater on the left. C7-T1: Minor disc bulging. Mild to moderate facet and ligament flavum hypertrophy. No stenosis. Thoracic MRI today reported separately. IMPRESSION: 1. Adjacent segment  disease at C6-C7 with Moderate spinal stenosis AND spinal cord mass effect. Possible faint myelomalacia at that level. No cord edema. 2. Adjacent segment disease at C3-C4 with moderate spinal stenosis, mild to moderate cord mass effect greater on the right. No cord signal changes. 3. Underlying chronic C4-C5 and C5-C6 ACDF. Possible pseudoarthrosis at the latter by CT last year. 4. Moderate to severe bilateral C4 and C7, moderate right C6 neural foraminal stenosis associated with the above. Electronically Signed: By: VEAR Hurst M.D. On: 05/04/2024 12:26   MR THORACIC SPINE WO CONTRAST Result Date: 05/04/2024 CLINICAL DATA:  63 year old male with back pain and weakness. Multilobar pneumonia last month. EXAM: MRI THORACIC SPINE WITHOUT CONTRAST TECHNIQUE: Multiplanar, multisequence MR imaging of the thoracic spine was performed. No intravenous contrast was administered. COMPARISON:  Chest CT 04/13/2024. Cervical spine MRI today. Lumbar MRI yesterday. FINDINGS: Limited cervical spine imaging:  Reported separately today. Thoracic spine segmentation:  Normal on the CT last month. Alignment:  Maintained thoracic kyphosis, stable from CT last month. Vertebrae: Normal background bone marrow signal. Maintained vertebral height. Sclerotic T4 vertebral body lesion with no marrow edema most compatible with benign bone island. No marrow edema or acute osseous abnormality elsewhere in the thoracic spine. Cord: Mild degenerative ventral cord mass effect in the lower thoracic spine, detailed below. Otherwise normal thoracic cord morphology. And maintained normal thoracic spinal cord signal. Conus medullaris appears normal at T12-L1. Paraspinal and other soft tissues: Abnormal bilateral lung parenchyma with increased involvement of the posterior right upper lobe compared to the last month (series  20, image 7). No pleural fluid. Negative visible mediastinum and upper abdominal viscera. Negative visualized posterior paraspinal soft  tissues. Disc levels: Mild for age thoracic spine degeneration above T8. T8-T9: Disc desiccation and disc space loss. Circumferential disc bulge with small to moderate right paracentral disc (series 20, image 26). Mild spinal stenosis and ventral right cord mass effect. No foraminal stenosis. T9-T10: Similar disc desiccation and right paracentral disc or disc osteophyte. Borderline to mild spinal stenosis and right ventral cord mass effect. No foraminal stenosis. T10-T11: Similar disc space loss. Circumferential disc bulge with more central to left paracentral disc or disc osteophyte complex. Mild spinal stenosis and ventral cord mass effect. No foraminal stenosis. T11-T12 and T12-L1 are negative for age. IMPRESSION: 1. No acute osseous abnormality in the thoracic spine and generally mild for age thoracic spine degeneration. Exception: lower thoracic disc degeneration T8-T9 through T10-T11 with up to mild spinal stenosis and subtle ventral cord mass effect. No thoracic spinal cord signal abnormality. 2. Heterogeneous bilateral lung parenchyma, nonspecific but these images suggest increased involvement of the posterior right upper lobe compared to the Chest CT 04/13/2024. Dr. REEVES DAISY aware of salient findings at 12:35 pm on 05/04/2024. Electronically Signed   By: VEAR Hurst M.D.   On: 05/04/2024 12:36   CT LUMBAR SPINE WO CONTRAST Result Date: 05/04/2024 EXAM: CT OF THE LUMBAR SPINE WITHOUT CONTRAST 05/04/2024 12:39:09 AM TECHNIQUE: CT of the lumbar spine was performed without the administration of intravenous contrast. Multiplanar reformatted images are provided for review. Automated exposure control, iterative reconstruction, and/or weight based adjustment of the mA/kV was utilized to reduce the radiation dose to as low as reasonably achievable. COMPARISON: MRI 05/03/2024 CLINICAL HISTORY: Low back pain, symptoms persist with > 6 wks treatment. 63 year old male with worsening low back pain and an episode of  urinary incontinence which is new for patient. Had been seen by neurosurgery about 4 days ago who had considered an outpatient MRI which was not performed. Reportedly reassuring exam here, recommended MRI which was performed and showed foraminal stenosis and impingement of S1 no apparent cord compression. Neurosurgery consulted who evaluated patient and recommended admission for pain control, CT imaging, and possible procedural intervention. FINDINGS: BONES AND ALIGNMENT: No acute fracture or traumatic malalignment. DEGENERATIVE CHANGES: Mild multilevel spondylosis. Mild degenerative disc disease at L5-S1. SOFT TISSUES: No acute abnormality. SPINAL CANAL AND NEURAL FORAMINA: No severe spinal canal narrowing. Mild neural foraminal narrowing on the left at L4-L5 and L5-S1. Severe neural foraminal narrowing on the right at L5-S1. IMPRESSION: 1. Severe neural foraminal narrowing on the right at L5-S1. Electronically signed by: Norman Gatlin MD 05/04/2024 12:52 AM EDT RP Workstation: [id number]   MR Lumbar Spine Wo Contrast Result Date: 05/03/2024 CLINICAL DATA:  Initial evaluation for acute low back pain, urinary incontinence. EXAM: MRI LUMBAR SPINE WITHOUT CONTRAST TECHNIQUE: Multiplanar, multisequence MR imaging of the lumbar spine was performed. No intravenous contrast was administered. COMPARISON:  Prior CT from 05/28/2023 FINDINGS: Segmentation: Standard. Lowest well-formed disc space labeled the L5-S1 level. Alignment: Straightening with mild reversal of the normal lumbar lordosis. Trace degenerative retrolisthesis of L1 on L2, L2 on L3 and L5 on S1. Vertebrae: Vertebral body height maintained without acute or chronic fracture. Bone marrow signal intensity within normal limits. No discrete or worrisome osseous lesions. Mild degenerative reactive endplate changes present about the L5-S1 interspace. No other abnormal marrow edema. Conus medullaris and cauda equina: Conus extends to the T12 level. Conus and cauda  equina appear normal. Paraspinal and  other soft tissues: Paraspinous soft tissues within normal limits. 3.3 cm simple left renal cyst, benign in appearance, no follow-up imaging recommended. Disc levels: L1-2: Degenerative intervertebral disc space narrowing with disc desiccation and diffuse disc bulge. Mild reactive endplate spurring. No spinal stenosis. Foramina remain patent paired L2-3: Degenerative intervertebral disc space narrowing with diffuse disc bulge and disc desiccation. Superimposed small central to left subarticular disc protrusion with annular fissure and slight inferior migration. Mild facet hypertrophy. Resultant mild narrowing of the lateral recesses bilaterally. Central canal remains patent. No significant foraminal stenosis. L3-4: Negative interspace. Mild bilateral facet hypertrophy. No spinal stenosis. Foramina remain patent. L4-5: Disc desiccation with mild disc bulge. Mild bilateral facet hypertrophy. No spinal stenosis. Mild bilateral L4 foraminal narrowing. L5-S1: Degenerative intervertebral disc space narrowing with disc desiccation and diffuse disc bulge. Reactive endplate spurring. Superimposed right subarticular disc extrusion impinges upon the descending right S1 nerve root as it courses through the right lateral recess (series 8, image 29). Disc material also closely approximates the descending left S1 nerve root, also potentially affected. Superimposed mild facet spurring. Resultant moderate right lateral recess stenosis. Central canal remains patent. Moderate bilateral L5 foraminal narrowing. IMPRESSION: 1. Right subarticular disc extrusion at L5-S1, impinging upon the descending right S1 nerve root as it courses through the right lateral recess. Disc material also closely approximates the descending left S1 nerve root, also potentially affected. 2. Small central to left subarticular disc protrusion at L2-3 with resultant mild bilateral lateral recess stenosis. 3. Mild disc bulging  with facet hypertrophy at L4-5 with resultant mild bilateral L4 foraminal stenosis. Electronically Signed   By: Morene Hoard M.D.   On: 05/03/2024 21:13   CT CHEST WO CONTRAST Result Date: 04/13/2024 CLINICAL DATA:  Pneumonia complication suspected. EXAM: CT CHEST WITHOUT CONTRAST TECHNIQUE: Multidetector CT imaging of the chest was performed following the standard protocol without IV contrast. RADIATION DOSE REDUCTION: This exam was performed according to the departmental dose-optimization program which includes automated exposure control, adjustment of the mA and/or kV according to patient size and/or use of iterative reconstruction technique. COMPARISON:  Chest CT dated 03/12/2024. FINDINGS: Evaluation of this exam is limited in the absence of intravenous contrast. Cardiovascular: There is no cardiomegaly or pericardial effusion. There is coronary vascular calcification. Mild atherosclerotic calcification of the thoracic aorta. No aneurysmal dilatation. The central pulmonary arteries are grossly unremarkable Mediastinum/Nodes: No hilar adenopathy. Subcarinal lymph node measures approximately 11 mm in short axis. Several additional subcentimeter paratracheal lymph nodes noted. The soft tissues are grossly unremarkable. No mediastinal fluid collection. Lungs/Pleura: Bilateral patchy areas of consolidation with air bronchograms predominantly involving the lower lobes and lower lung fields. Interval decrease in the consolidation in the left lower lobe but increased in the size of the consolidation in the right lower and right middle lobes. New scattered nodular densities. No pleural effusion or pneumothorax. The central airways are patent. Upper Abdomen: No acute abnormality. Musculoskeletal: No acute osseous pathology. A sclerotic focus in T4 as seen previously, nonspecific. IMPRESSION: 1. Multilobar pneumonia with waxing and waning areas of consolidation. 2.  Aortic Atherosclerosis (ICD10-I70.0).  Electronically Signed   By: Vanetta Chou M.D.   On: 04/13/2024 14:03      Labs: BNP (last 3 results) No results for input(s): BNP in the last 8760 hours. Basic Metabolic Panel: Recent Labs  Lab 05/03/24 2015 05/04/24 0502  NA 135 140  K 3.9 4.3  CL 102 106  CO2 24 23  GLUCOSE 107* 126*  BUN 17 20  CREATININE 1.70* 1.68*  CALCIUM  8.7* 9.0   Liver Function Tests: Recent Labs  Lab 05/03/24 2015  AST 21  ALT 21  ALKPHOS 69  BILITOT 0.5  PROT 6.1*  ALBUMIN 3.5   No results for input(s): LIPASE, AMYLASE in the last 168 hours. No results for input(s): AMMONIA in the last 168 hours. CBC: Recent Labs  Lab 05/03/24 2015 05/04/24 0502  WBC 8.4 8.6  NEUTROABS 4.7  --   HGB 14.3 13.9  HCT 44.3 41.8  MCV 91.2 88.9  PLT 221 219   Cardiac Enzymes: No results for input(s): CKTOTAL, CKMB, CKMBINDEX, TROPONINI in the last 168 hours. BNP: Invalid input(s): POCBNP CBG: No results for input(s): GLUCAP in the last 168 hours. D-Dimer No results for input(s): DDIMER in the last 72 hours. Hgb A1c No results for input(s): HGBA1C in the last 72 hours. Lipid Profile No results for input(s): CHOL, HDL, LDLCALC, TRIG, CHOLHDL, LDLDIRECT in the last 72 hours. Thyroid  function studies No results for input(s): TSH, T4TOTAL, T3FREE, THYROIDAB in the last 72 hours.  Invalid input(s): FREET3 Anemia work up No results for input(s): VITAMINB12, FOLATE, FERRITIN, TIBC, IRON, RETICCTPCT in the last 72 hours. Urinalysis    Component Value Date/Time   COLORURINE YELLOW (A) 05/28/2023 1802   APPEARANCEUR CLEAR (A) 05/28/2023 1802   LABSPEC 1.025 05/28/2023 1802   PHURINE 5.0 05/28/2023 1802   GLUCOSEU NEGATIVE 05/28/2023 1802   GLUCOSEU NEGATIVE 03/14/2021 0841   HGBUR NEGATIVE 05/28/2023 1802   BILIRUBINUR NEGATIVE 05/28/2023 1802   BILIRUBINUR neg 05/26/2023 0945   KETONESUR NEGATIVE 05/28/2023 1802   PROTEINUR NEGATIVE  05/28/2023 1802   UROBILINOGEN 0.2 05/26/2023 0945   UROBILINOGEN 0.2 03/14/2021 0841   NITRITE NEGATIVE 05/28/2023 1802   LEUKOCYTESUR NEGATIVE 05/28/2023 1802   Sepsis Labs Recent Labs  Lab 05/03/24 2015 05/04/24 0502  WBC 8.4 8.6   Microbiology No results found for this or any previous visit (from the past 240 hours).   Total time spend on discharging this patient, including the last patient exam, discussing the hospital stay, instructions for ongoing care as it relates to all pertinent caregivers, as well as preparing the medical discharge records, prescriptions, and/or referrals as applicable, is 40 minutes.    Ellouise Haber, MD  [facility] 05/05/2024, 3:36 PM

## 2024-05-05 NOTE — Plan of Care (Signed)
  Problem: Education: Goal: Knowledge of General Education information will improve Description: Including pain rating scale, medication(s)/side effects and non-pharmacologic comfort measures Outcome: Progressing   Problem: Health Behavior/Discharge Planning: Goal: Ability to manage health-related needs will improve Outcome: Progressing   Problem: Nutrition: Goal: Adequate nutrition will be maintained Outcome: Progressing   Problem: Coping: Goal: Level of anxiety will decrease Outcome: Progressing   Problem: Pain Managment: Goal: General experience of comfort will improve and/or be controlled Outcome: Progressing   Problem: Safety: Goal: Ability to remain free from injury will improve Outcome: Progressing   Problem: Skin Integrity: Goal: Risk for impaired skin integrity will decrease Outcome: Progressing

## 2024-05-05 NOTE — Progress Notes (Signed)
   05/05/24 1200  Spiritual Encounters  Type of Visit Initial  Care provided to: Pt and family  Referral source Patient request  Reason for visit Advance directives  OnCall Visit No   Chaplain visited patient per an AD entered in the EPIC system.  Chaplain explained the document and the process to patient and spouse.  Chaplain also offered a compassionate presence and reflective listening.  Patient and spouse requested prayer and Chaplain prayed for them.    Rev. Rana M. Nicholaus, M.Div. Chaplain Resident [facility]

## 2024-05-05 NOTE — Care Management Obs Status (Signed)
 MEDICARE OBSERVATION STATUS NOTIFICATION   Patient Details  Name: Benjamin Fields MRN: [id number] Date of Birth: 10-05-1960   Medicare Observation Status Notification Given:  Yes    Colleen Donahoe W, CMA 05/05/2024, 11:21 AM

## 2024-05-05 NOTE — Telephone Encounter (Signed)
 Per secure chat with Dr. Clois: will need f/u with me next week. 30 minute or 15 minute   Left message for patient's wife to call our office back.

## 2024-05-05 NOTE — Progress Notes (Signed)
 Discharge criteria met

## 2024-05-06 ENCOUNTER — Telehealth: Payer: Self-pay

## 2024-05-06 ENCOUNTER — Ambulatory Visit: Admitting: Cardiology

## 2024-05-06 NOTE — Transitions of Care (Post Inpatient/ED Visit) (Signed)
   05/06/2024  Name: Benjamin Fields MRN: [id number] DOB: 06-30-1961  Today's TOC FU Call Status: Today's TOC FU Call Status:: Unsuccessful Call (1st Attempt) Unsuccessful Call (1st Attempt) Date: 05/06/24  Attempted to reach the patient regarding the most recent Inpatient/ED visit.  Follow Up Plan: Additional outreach attempts will be made to reach the patient to complete the Transitions of Care (Post Inpatient/ED visit) call.   Signature Julian Lemmings, LPN [facility] Nurse Health Advisor Direct Dial [phone]

## 2024-05-07 NOTE — Transitions of Care (Post Inpatient/ED Visit) (Signed)
   05/07/2024  Name: Benjamin Fields MRN: [id number] DOB: 12/11/1960  Today's TOC FU Call Status: Today's TOC FU Call Status:: Unsuccessful Call (2nd Attempt) Unsuccessful Call (1st Attempt) Date: 05/06/24 Unsuccessful Call (2nd Attempt) Date: 05/07/24  Attempted to reach the patient regarding the most recent Inpatient/ED visit.  Follow Up Plan: Additional outreach attempts will be made to reach the patient to complete the Transitions of Care (Post Inpatient/ED visit) call.   Signature Julian Lemmings, LPN [facility] Nurse Health Advisor Direct Dial [phone]

## 2024-05-07 NOTE — Telephone Encounter (Signed)
 Left message for patient to call our office back.

## 2024-05-08 ENCOUNTER — Ambulatory Visit

## 2024-05-10 NOTE — Telephone Encounter (Signed)
 Scheduled appointment with patient's wife for 09/16 at 1:45 and left message for patient to confirm appointment.

## 2024-05-10 NOTE — Transitions of Care (Post Inpatient/ED Visit) (Signed)
   05/10/2024  Name: EADEN HETTINGER MRN: [id number] DOB: Jan 12, 1961  Today's TOC FU Call Status: Today's TOC FU Call Status:: Unsuccessful Call (3rd Attempt) Unsuccessful Call (1st Attempt) Date: 05/06/24 Unsuccessful Call (2nd Attempt) Date: 05/07/24 Unsuccessful Call (3rd Attempt) Date: 05/10/24  Attempted to reach the patient regarding the most recent Inpatient/ED visit.  Follow Up Plan: No further outreach attempts will be made at this time. We have been unable to contact the patient.  Signature Julian Lemmings, LPN [facility] Nurse Health Advisor Direct Dial [phone]

## 2024-05-11 ENCOUNTER — Ambulatory Visit (INDEPENDENT_AMBULATORY_CARE_PROVIDER_SITE_OTHER): Admitting: Neurosurgery

## 2024-05-11 ENCOUNTER — Encounter: Payer: Self-pay | Admitting: Neurosurgery

## 2024-05-11 VITALS — BP 138/80 | Ht 70.0 in | Wt 180.0 lb

## 2024-05-11 DIAGNOSIS — M5416 Radiculopathy, lumbar region: Secondary | ICD-10-CM

## 2024-05-11 DIAGNOSIS — M5117 Intervertebral disc disorders with radiculopathy, lumbosacral region: Secondary | ICD-10-CM | POA: Diagnosis not present

## 2024-05-11 NOTE — Progress Notes (Signed)
 Referring Physician:  Wendee Lynwood HERO, NP [address] [place],  [place] [address]  Primary Physician:  Wendee Lynwood HERO, NP  History of Present Illness: 05/11/2024 [name] is here today with a chief complaint of leg pain.  He was in the hospital with terrible leg pain last week.  He had an epidural steroid injection which has helped somewhat.  He continues to deny any issues with dexterity, numbness in his hands, or balance issues.     05/04/2024 [name] is here today with a chief complaint of an episode of urinary incontinence on the date of presentation.  It sounds like he had difficulty making it to the bathroom.  He denies perineal numbness.  He denies distal weakness in his legs.  He does report difficulty walking and has to sit down due to his legs feeling weak.  His weakness primarily in his upper legs.  He does have some pain in his right buttock and down the back of his leg as well.   This been worsening for the past couple of weeks.  He presents today due to intractable pain and his episode of urinary incontinence.  Review of Systems:  A 10 point review of systems is negative, except for the pertinent positives and negatives detailed in the HPI.  Past Medical History: Past Medical History:  Diagnosis Date   Allergy     mild- no meds    Anxiety 03/09/18   Depression 03/09/2018   Hypertension 01/08/16   Neuromuscular disorder (HCC) 05/30/2018   mild nerve issue right leg    RA (rheumatoid arthritis) [facility])     Past Surgical History: Past Surgical History:  Procedure Laterality Date   CERVICAL SPINE SURGERY  2010   COLONOSCOPY     COLONOSCOPY  04/2020   IR INJECT/THERA/INC NEEDLE/CATH/PLC EPI/LUMB/SAC W/IMG  05/05/2024   ROTATOR CUFF REPAIR Right    SPINE SURGERY  12/2004   TONSILLECTOMY      Allergies: Allergies as of 05/11/2024   (No Known Allergies)    Medications:  Current Outpatient Medications:    albuterol  (VENTOLIN  HFA) 108 (90  Base) MCG/ACT inhaler, Inhale 2 puffs into the lungs every 6 (six) hours as needed for wheezing or shortness of breath., Disp: 8 g, Rfl: 2   atorvastatin  (LIPITOR) 10 MG tablet, Take 1 tablet (10 mg total) by mouth daily., Disp: 90 tablet, Rfl: 1   DULoxetine  (CYMBALTA ) 60 MG capsule, Take 1 capsule (60 mg total) by mouth daily., Disp: 90 capsule, Rfl: 0   gabapentin  (NEURONTIN ) 100 MG capsule, Take 1 capsule (100mg ) in the am and take 4 capsules (400mg ) QHS, Disp: 450 capsule, Rfl: 0   losartan  (COZAAR ) 25 MG tablet, Take 1 tablet (25 mg total) by mouth daily., Disp: , Rfl:    methocarbamol  (ROBAXIN ) 500 MG tablet, Take 1 tablet (500 mg total) by mouth every 8 (eight) hours as needed for muscle spasms. Can cause drowsiness., Disp: 60 tablet, Rfl: 0   Multiple Vitamin (MULTIVITAMIN) tablet, Take 1 tablet by mouth daily. OTC, Disp: , Rfl:   Social History: Social History   Tobacco Use   Smoking status: Never   Smokeless tobacco: Never  Vaping Use   Vaping status: Never Used  Substance Use Topics   Alcohol use: Yes    Comment: rare wine during the holiday   Drug use: Never    Family Medical History: Family History  Problem Relation Age of Onset   Parkinson's disease Mother  Atrial fibrillation Mother    Arthritis Mother    Depression Mother    Heart failure Father    Hypertension Father    Prostate cancer Father 47   COPD Father    Heart disease Father    Colon cancer Neg Hx    Colon polyps Neg Hx    Esophageal cancer Neg Hx    Rectal cancer Neg Hx    Stomach cancer Neg Hx     Physical Examination: There were no vitals filed for this visit.  General: Patient is in no apparent distress. Attention to examination is appropriate.  Neck:   Supple.  Full range of motion.  Respiratory: Patient is breathing without any difficulty.   NEUROLOGICAL:     Awake, alert, oriented to person, place, and time.  Speech is clear and fluent.   Cranial Nerves: Pupils equal round and  reactive to light.  Facial tone is symmetric.  Facial sensation is symmetric. Shoulder shrug is symmetric. Tongue protrusion is midline.  There is no pronator drift.  Strength: Side Biceps Triceps Deltoid Interossei Grip Wrist Ext. Wrist Flex.  R 5 5 5 5 5 5 5   L 5 5 5 5 5 5 5    Side Iliopsoas Quads Hamstring PF DF EHL  R 5 5 5 5 5 5   L 5 5 5 5 5 5    Reflexes are 2+ and symmetric at the biceps, triceps, brachioradialis, patella and achilles.   Hoffman's is absent.   Bilateral upper and lower extremity sensation is intact to light touch.    No evidence of dysmetria noted.  Gait is normal.     Medical Decision Making  Imaging: MRI C spine 05/04/2024 IMPRESSION: 1. Adjacent segment disease at C6-C7 with Moderate spinal stenosis AND spinal cord mass effect. Possible faint myelomalacia at that level. No cord edema.   2. Adjacent segment disease at C3-C4 with moderate spinal stenosis, mild to moderate cord mass effect greater on the right. No cord signal changes.   3. Underlying chronic C4-C5 and C5-C6 ACDF. Possible pseudoarthrosis at the latter by CT last year.   4. Moderate to severe bilateral C4 and C7, moderate right C6 neural foraminal stenosis associated with the above.   Electronically Signed: By: VEAR Hurst M.D. On: 05/04/2024 12:26   MRI L spine 05/03/2024  IMPRESSION: 1. Right subarticular disc extrusion at L5-S1, impinging upon the descending right S1 nerve root as it courses through the right lateral recess. Disc material also closely approximates the descending left S1 nerve root, also potentially affected. 2. Small central to left subarticular disc protrusion at L2-3 with resultant mild bilateral lateral recess stenosis. 3. Mild disc bulging with facet hypertrophy at L4-5 with resultant mild bilateral L4 foraminal stenosis.     Electronically Signed   By: Morene Hoard M.D.   On: 05/03/2024 21:13  I have personally reviewed the images and agree with  the above interpretation.  Assessment and Plan: [name] is a pleasant 63 y.o. male with lumbar radiculopathy due to a right sided L5-S1 disc herniation.  He is doing a little bit better than he was when he was in the hospital.  I would like to start him on physical therapy.  I will see him back in 6 to 8 weeks to see how he is doing.  I spent a total of 15 minutes in this patient's care today. This time was spent reviewing pertinent records including imaging studies, obtaining and confirming history, performing a directed evaluation,  formulating and discussing my recommendations, and documenting the visit within the medical record.     Thank you for involving me in the care of this patient.      Kadence Mimbs K. Clois MD, [facility] Neurosurgery

## 2024-05-12 ENCOUNTER — Telehealth: Payer: Self-pay | Admitting: Internal Medicine

## 2024-05-12 ENCOUNTER — Other Ambulatory Visit: Payer: Self-pay | Admitting: Internal Medicine

## 2024-05-12 DIAGNOSIS — G4733 Obstructive sleep apnea (adult) (pediatric): Secondary | ICD-10-CM

## 2024-05-12 NOTE — Telephone Encounter (Signed)
 The home sleep test order has been sent to [facility]

## 2024-05-12 NOTE — Telephone Encounter (Signed)
 Dr. Isaiah ordered new cpap setup for this patient and the order was sent to [facility]. We have received a note from [facility] that they need diagnostic sleep study dated within a year from now. The sleep study that we have was done 10/17/2021

## 2024-05-12 NOTE — Telephone Encounter (Signed)
 Called patient to notify him that he will have to have a HST before we can get the CPAP approved.We have placed the order and sent it to [facility]. They will reach out to him.  LMTCB. E2C2 please advise when patient calls back.

## 2024-05-12 NOTE — Progress Notes (Unsigned)
 Needs HST for insurance purposes

## 2024-05-14 ENCOUNTER — Ambulatory Visit (INDEPENDENT_AMBULATORY_CARE_PROVIDER_SITE_OTHER): Admitting: Nurse Practitioner

## 2024-05-14 VITALS — BP 124/70 | HR 82 | Temp 98.3°F | Ht 70.0 in | Wt 188.2 lb

## 2024-05-14 DIAGNOSIS — M5416 Radiculopathy, lumbar region: Secondary | ICD-10-CM | POA: Diagnosis not present

## 2024-05-14 NOTE — Progress Notes (Signed)
 Acute Office Visit  Subjective:     Patient ID: Benjamin Fields, male    DOB: [birth year], 63 y.o.   MRN: [id number]  Chief Complaint  Patient presents with   Hospitalization Follow-up    Pt complains of doing pretty good.     HPI   Discussed the use of AI scribe software for clinical note transcription with the patient, who gave verbal consent to proceed.  History of Present Illness Benjamin Fields is a 63 year old male who presents for follow-up of back pain and recent hospitalization.  He has a history of right foot drop and low back pain, which led to a referral to neurosurgery. On May 03, 2024, he experienced urinary incontinence and leg weakness, prompting an emergency department visit. Imaging studies, including CT and MRI of the lumbar, thoracic, and cervical spine, were performed. He received an injection from a neurosurgeon. Since the hospital visit, he has noticed significant improvement in his symptoms. His foot is feeling better, and he no longer experiences urinary incontinence. He mentions occasional tingling in his toes but denies any numbness or weakness in his legs. He continues to work without difficulty.  He has a history of pneumonia, which progressed to double pneumonia in August 2025. He was treated and is scheduled for a follow-up in October 2025. A previous MRI showed discoloration in the lower part of his lung, which will be re-evaluated with a CT scan. No fever, chills, or breathing difficulties currently.  He underwent a sleep study in early 2023, which showed significant sleep apnea with 30 episodes of apnea per hour. He has since lost 12-15 pounds and is scheduled for a repeat home sleep test to assess any changes. He feels less tired and notes his oxygen levels have improved, with a recent reading of 97%.  He was started on Lipitor in August 2025 after a CT scan revealed atherosclerosis. He has a family history of heart disease and is scheduled  for a lipid recheck in November 2025.    Review of Systems  Constitutional:  Negative for chills and fever.  Respiratory:  Negative for shortness of breath.   Cardiovascular:  Negative for chest pain.  Genitourinary:        No B&B involvement   Musculoskeletal:  Positive for back pain.  Neurological:  Positive for tingling. Negative for weakness.        Objective:    BP 124/70   Pulse 82   Temp 98.3 F (36.8 C) (Oral)   Ht 5' 10 (1.778 m)   Wt 188 lb 3.2 oz (85.4 kg)   SpO2 97%   BMI 27.00 kg/m  BP Readings from Last 3 Encounters:  05/14/24 124/70  05/11/24 138/80  05/05/24 116/78   Wt Readings from Last 3 Encounters:  05/14/24 188 lb 3.2 oz (85.4 kg)  05/11/24 180 lb (81.6 kg)  05/03/24 180 lb (81.6 kg)   SpO2 Readings from Last 3 Encounters:  05/14/24 97%  05/05/24 98%  04/22/24 95%      Physical Exam Vitals and nursing note reviewed.  Constitutional:      Appearance: Normal appearance.  Cardiovascular:     Rate and Rhythm: Normal rate and regular rhythm.     Heart sounds: Normal heart sounds.  Pulmonary:     Effort: Pulmonary effort is normal.     Breath sounds: Normal breath sounds.  Musculoskeletal:     Lumbar back: Negative right straight leg raise test and negative left  straight leg raise test.  Neurological:     General: No focal deficit present.     Mental Status: He is alert.     Deep Tendon Reflexes:     Reflex Scores:      Patellar reflexes are 2+ on the right side and 2+ on the left side.    Comments: Bilateral lower extremity strength 5/5     No results found for any visits on 05/14/24.      Assessment & Plan:   Problem List Items Addressed This Visit       Nervous and Auditory   Lumbar radiculopathy - Primary   Assessment and Plan Assessment & Plan Neuroforaminal stenosis of lumbar spine with right foot drop, paresthesia, and resolved urinary incontinence Symptoms improved with conservative management. Minimal tingling  in toes, no urinary or bowel incontinence. 75% chance of resolution with physical therapy per neurosurgeon. - Continue physical therapy as prescribed by neurosurgeon. - Monitor for recurrence of urinary or bowel incontinence or leg weakness. - Follow up with neurosurgeon as needed. -reviewed ed note, inpatient note, and all imaging from hospital   Aortic Arthrosclerosis   Atorvastatin  initiated in August. Lipid levels to be rechecked in November. - Continue atorvastatin  (Lipitor) 10 mg oral daily. - Attend lipid recheck appointment in November.  Lobar pneumonia and bilateral pneumonia Pneumonia improved. Follow-up with Dr. Tressa scheduled for October with repeat CT scan. - Attend follow-up appointment with Dr. Tressa in October. - Undergo repeat CT scan of the lungs.  Obstructive sleep apnea, suspected recurrence Previous sleep study showed significant apnea. Home sleep test ordered due to weight loss and improved oxygen levels. - Complete home sleep test as ordered.  No orders of the defined types were placed in this encounter.   Return in about 8 months (around 01/11/2025) for CPE and Labs.  Adina Crandall, NP

## 2024-05-14 NOTE — Patient Instructions (Addendum)
 Nice to see you today Keep your lab appointment me as scheduled Follow up in approx 8 months for your physical and labs, sooner if you need me

## 2024-05-17 ENCOUNTER — Encounter: Payer: Self-pay | Admitting: Neurosurgery

## 2024-05-17 NOTE — Telephone Encounter (Signed)
 Lm on patient's secure voicemail notifying him.  Nothing further needed.

## 2024-05-26 ENCOUNTER — Encounter

## 2024-05-29 ENCOUNTER — Other Ambulatory Visit: Payer: Self-pay | Admitting: Nurse Practitioner

## 2024-06-05 ENCOUNTER — Encounter

## 2024-06-05 DIAGNOSIS — G4733 Obstructive sleep apnea (adult) (pediatric): Secondary | ICD-10-CM

## 2024-06-08 ENCOUNTER — Other Ambulatory Visit

## 2024-06-16 DIAGNOSIS — G4733 Obstructive sleep apnea (adult) (pediatric): Secondary | ICD-10-CM | POA: Diagnosis not present

## 2024-06-16 DIAGNOSIS — G4730 Sleep apnea, unspecified: Secondary | ICD-10-CM | POA: Diagnosis not present

## 2024-06-17 ENCOUNTER — Ambulatory Visit: Payer: Self-pay

## 2024-06-28 DIAGNOSIS — I700 Atherosclerosis of aorta: Secondary | ICD-10-CM

## 2024-06-28 HISTORY — DX: Atherosclerosis of aorta: I70.0

## 2024-06-28 NOTE — Progress Notes (Unsigned)
 Cardiology Office Note  Date:  07/02/2024   ID:  Benjamin Fields, Benjamin Fields Apr 22, 1961, MRN [id number]  PCP:  Wendee Lynwood HERO, NP   Chief Complaint  Patient presents with   New Patient (Initial Visit)    Ref by Dr. Isaiah for an abnormal CT scan. Patient denies chest pain or shortness of breath.     HPI:  Benjamin Fields is a 63 y.o. male with past medical history of: Past Medical History:  Diagnosis Date   Allergy     mild- no meds    Anxiety 03/09/18   Depression 03/09/2018   Hypertension 01/08/16   Neuromuscular disorder (HCC) 05/30/2018   mild nerve issue right leg    RA (rheumatoid arthritis) (HCC)   Who presents by referral from from Dr. Isaiah for abnormal CT chest, shortness of breath, coronary artery disease  In hospital last month, multilobar PNA  CT chest August 2025 Coronary calcification noted, mild aortic atherosclerosis Multilobar pneumonia  CT images pulled up from the June 30, 2024 showing coronary calcification, multilobar pneumonia  Non smoker  Still having fevers/chills at bedtime No recent abx Oxygen down to 89% times Mild shortness of breath at times  Lab work reviewed Total cholesterol 168 LDL 105 A1c 5.8  CT lung: 2/74, 11/25  Denies chest pain concerning for angina  Family hx Father with valve issue Grandfather with MI, died  EKG personally reviewed by myself on todays visit EKG Interpretation Date/Time:  Friday July 02 2024 08:44:53 EST Ventricular Rate:  86 PR Interval:  144 QRS Duration:  96 QT Interval:  340 QTC Calculation: 406 R Axis:   -1  Text Interpretation: Normal sinus rhythm No previous ECGs available Confirmed by Perla Lye [phone]) on 07/02/2024 8:49:11 AM    PMH:   has a past medical history of Allergy , Anxiety (03/09/18), Depression (03/09/2018), Hypertension (01/08/16), Neuromuscular disorder (HCC) (05/30/2018), and RA (rheumatoid arthritis) (HCC).   PSH:    Past Surgical History:  Procedure Laterality Date    CERVICAL SPINE SURGERY  2010   COLONOSCOPY     COLONOSCOPY  04/2020   IR INJECT/THERA/INC NEEDLE/CATH/PLC EPI/LUMB/SAC W/IMG  05/05/2024   ROTATOR CUFF REPAIR Right    SPINE SURGERY  12/2004   TONSILLECTOMY      Current Outpatient Medications  Medication Sig Dispense Refill   albuterol  (VENTOLIN  HFA) 108 (90 Base) MCG/ACT inhaler Inhale 2 puffs into the lungs every 6 (six) hours as needed for wheezing or shortness of breath. 8 g 2   atorvastatin  (LIPITOR) 10 MG tablet Take 1 tablet (10 mg total) by mouth daily. 90 tablet 1   DULoxetine  (CYMBALTA ) 60 MG capsule Take 1 capsule (60 mg total) by mouth daily. 90 capsule 0   gabapentin  (NEURONTIN ) 100 MG capsule TAKE 1 CAPSULE BY MOUTH IN THE MORNING AND 4 EVERY DAY AT BEDTIME 450 capsule 0   losartan  (COZAAR ) 25 MG tablet Take 1 tablet (25 mg total) by mouth daily.     Multiple Vitamin (MULTIVITAMIN) tablet Take 1 tablet by mouth daily. OTC     methocarbamol  (ROBAXIN ) 500 MG tablet Take 1 tablet (500 mg total) by mouth every 8 (eight) hours as needed for muscle spasms. Can cause drowsiness. 60 tablet 0   No current facility-administered medications for this visit.     Allergies:   Patient has no known allergies.   Social History:  The patient  reports that he has never smoked. He has never used smokeless tobacco. He reports current alcohol use. He  reports that he does not use drugs.   Family History:   family history includes Arthritis in his mother; Atrial fibrillation in his mother; COPD in his father; Depression in his mother; Heart disease in his father; Heart failure in his father; Hypertension in his father; Parkinson's disease in his mother; Prostate cancer (age of onset: 34) in his father.    Review of Systems: Review of Systems  Constitutional:  Positive for chills and fever.  HENT: Negative.    Respiratory: Negative.    Cardiovascular: Negative.   Gastrointestinal: Negative.   Musculoskeletal: Negative.   Neurological:  Negative.   Psychiatric/Behavioral: Negative.    All other systems reviewed and are negative.   PHYSICAL EXAM: VS:  BP 110/62 (BP Location: Right Arm, Patient Position: Sitting, Cuff Size: Normal)   Pulse 86   Ht 5' 10 (1.778 m)   Wt 188 lb (85.3 kg)   SpO2 93%   BMI 26.98 kg/m  , BMI Body mass index is 26.98 kg/m. GEN: Well nourished, well developed, in no acute distress HEENT: normal Neck: no JVD, carotid bruits, or masses Cardiac: RRR; no murmurs, rubs, or gallops,no edema  Respiratory:  clear to auscultation bilaterally, normal work of breathing GI: soft, nontender, nondistended, + BS MS: no deformity or atrophy Skin: warm and dry, no rash Neuro:  Strength and sensation are intact Psych: euthymic mood, full affect   Recent Labs: 01/14/2024: TSH 1.43 05/03/2024: ALT 21 05/04/2024: BUN 20; Creatinine, Ser 1.68; Hemoglobin 13.9; Platelets 219; Potassium 4.3; Sodium 140    Lipid Panel Lab Results  Component Value Date   CHOL 168 01/14/2024   HDL 36.40 (L) 01/14/2024   LDLCALC 105 (H) 01/14/2024   TRIG 131.0 01/14/2024      Wt Readings from Last 3 Encounters:  07/02/24 188 lb (85.3 kg)  05/14/24 188 lb 3.2 oz (85.4 kg)  05/11/24 180 lb (81.6 kg)     ASSESSMENT AND PLAN:  Problem List Items Addressed This Visit       Cardiology Problems   Essential hypertension (Chronic)   Relevant Orders   EKG 12-Lead (Completed)   Aortic atherosclerosis   Relevant Orders   EKG 12-Lead (Completed)   Coronary artery disease involving native coronary artery of native heart without angina pectoris - Primary   Relevant Orders   EKG 12-Lead (Completed)     Other   Dyslipidemia   Coronary artery calcification Mild diffuse coronary calcification LAD and RCA predominantly, images pulled up and reviewed Mild shortness of breath likely secondary to multifocal pneumonia - Denies chest pain concerning for angina - No further ischemic workup at this time - Discussed anginal  symptoms to watch for - Recommend LDL less than 55 Scheduled to have lab work done on Monday in 3 days time  Hyperlipidemia Tolerating Lipitor 10 mg daily Goal LDL less than 55 May need higher dose Lipitor or Zetia to achieve goal  Aortic atherosclerosis Minimal noted on CT scan  Multifocal pneumonia Mild shortness of breath, mild hypoxia, CT scan with numerous lung findings consistent with multilobar etiology, still having chills fever - Will send a message to primary care and pulmonary  Signed, Velinda Lunger, M.D., Ph.D. [facility] [place], [place] [phone]

## 2024-06-30 ENCOUNTER — Ambulatory Visit
Admission: RE | Admit: 2024-06-30 | Discharge: 2024-06-30 | Disposition: A | Discharge status: Home / Self Care | Source: Ambulatory Visit | Attending: Internal Medicine | Admitting: Internal Medicine

## 2024-06-30 DIAGNOSIS — J181 Lobar pneumonia, unspecified organism: Secondary | ICD-10-CM | POA: Insufficient documentation

## 2024-06-30 DIAGNOSIS — R9389 Abnormal findings on diagnostic imaging of other specified body structures: Secondary | ICD-10-CM | POA: Diagnosis present

## 2024-07-02 ENCOUNTER — Encounter: Payer: Self-pay | Admitting: Cardiovascular Disease

## 2024-07-02 ENCOUNTER — Ambulatory Visit: Attending: Cardiovascular Disease | Admitting: Cardiovascular Disease

## 2024-07-02 ENCOUNTER — Telehealth: Payer: Self-pay | Admitting: Nurse Practitioner

## 2024-07-02 VITALS — BP 110/62 | HR 86 | Ht 70.0 in | Wt 188.0 lb

## 2024-07-02 DIAGNOSIS — I2510 Atherosclerotic heart disease of native coronary artery without angina pectoris: Secondary | ICD-10-CM | POA: Diagnosis present

## 2024-07-02 DIAGNOSIS — I10 Essential (primary) hypertension: Secondary | ICD-10-CM | POA: Insufficient documentation

## 2024-07-02 DIAGNOSIS — E785 Hyperlipidemia, unspecified: Secondary | ICD-10-CM | POA: Diagnosis present

## 2024-07-02 DIAGNOSIS — I700 Atherosclerosis of aorta: Secondary | ICD-10-CM | POA: Diagnosis present

## 2024-07-02 NOTE — Telephone Encounter (Signed)
 Unable to reach patient by phone and left v/m requesting call back at [phone].  Pt needs to be triaged due to pneumonia and pt having chills. E2C2 can triage pt. Sending note to [facility] triage.   I was able to speak with pts wife and she said pt was at work now and pt is not in distress per pts wife.  Pt still has fevers on and off at bedtime.  Not sure of last nights temp but highest it has been at night is 100.6. Pt does not usually have fever during the day.pt has non prod cough, and SOB on and off upon exertion. No CP.pt has chills on and off. No available appts at [facility] but offered appt today at [facility] office and pts wife said she is going to call Dr Jacqulyn office since he is following pt for the pneumonia. UC & ED precautions given and pts wife voiced understanding and appreciative of call. Sending note to M.C[employer] NP and [employer].

## 2024-07-02 NOTE — Patient Instructions (Signed)

## 2024-07-02 NOTE — Telephone Encounter (Signed)
 Got a message from Dr. Gollan that he pulled patients CT scan up and it is still showig PNA. Dr. Gollan mentioned the patient is having chills. Can we triage the patient please

## 2024-07-02 NOTE — Telephone Encounter (Signed)
 Noted. I will forward to Dr. Isaiah also

## 2024-07-03 ENCOUNTER — Encounter: Payer: Self-pay | Admitting: Internal Medicine

## 2024-07-05 ENCOUNTER — Other Ambulatory Visit (INDEPENDENT_AMBULATORY_CARE_PROVIDER_SITE_OTHER): Payer: Self-pay | Admitting: Nurse Practitioner

## 2024-07-05 ENCOUNTER — Other Ambulatory Visit (INDEPENDENT_AMBULATORY_CARE_PROVIDER_SITE_OTHER)

## 2024-07-05 DIAGNOSIS — I10 Essential (primary) hypertension: Secondary | ICD-10-CM

## 2024-07-05 DIAGNOSIS — I2510 Atherosclerotic heart disease of native coronary artery without angina pectoris: Secondary | ICD-10-CM

## 2024-07-05 DIAGNOSIS — R7303 Prediabetes: Secondary | ICD-10-CM

## 2024-07-05 LAB — COMPREHENSIVE METABOLIC PANEL WITH GFR
ALT: 14 U/L (ref 0–53)
AST: 10 U/L (ref 0–37)
Albumin: 3.7 g/dL (ref 3.5–5.2)
Alkaline Phosphatase: 65 U/L (ref 39–117)
BUN: 21 mg/dL (ref 6–23)
CO2: 27 meq/L (ref 19–32)
Calcium: 9.2 mg/dL (ref 8.4–10.5)
Chloride: 101 meq/L (ref 96–112)
Creatinine, Ser: 1.64 mg/dL — ABNORMAL HIGH (ref 0.40–1.50)
GFR: 43.64 mL/min — ABNORMAL LOW (ref >60.00)
Glucose, Bld: 116 mg/dL — ABNORMAL HIGH (ref 70–99)
Potassium: 4.9 meq/L (ref 3.5–5.1)
Sodium: 139 meq/L (ref 135–145)
Total Bilirubin: 1.1 mg/dL (ref 0.2–1.2)
Total Protein: 5.8 g/dL — ABNORMAL LOW (ref 6.0–8.3)

## 2024-07-05 LAB — LIPID PANEL
Cholesterol: 115 mg/dL (ref 0–200)
HDL: 31.4 mg/dL — ABNORMAL LOW (ref >39.00)
LDL Cholesterol: 66 mg/dL (ref 0–99)
NonHDL: 83.81
Total CHOL/HDL Ratio: 4
Triglycerides: 90 mg/dL (ref 0.0–149.0)
VLDL: 18 mg/dL (ref 0.0–40.0)

## 2024-07-05 LAB — HEMOGLOBIN A1C: Hgb A1c MFr Bld: 6.3 % (ref 4.6–6.5)

## 2024-07-05 NOTE — Telephone Encounter (Signed)
 Contacted pt via open encounter/MyChart message. Advised to seek urgent care/ER due to symptoms and to attend upcoming appointment with Dr. Isaiah as well as his recommendations for xray, urine sample, and cultures.

## 2024-07-06 ENCOUNTER — Other Ambulatory Visit: Payer: Self-pay

## 2024-07-06 ENCOUNTER — Emergency Department

## 2024-07-06 ENCOUNTER — Inpatient Hospital Stay
Admission: EM | Admit: 2024-07-06 | Discharge: 2024-07-08 | DRG: 194 | Disposition: A | Discharge status: Home / Self Care | Attending: Internal Medicine | Admitting: Internal Medicine

## 2024-07-06 DIAGNOSIS — M0609 Rheumatoid arthritis without rheumatoid factor, multiple sites: Secondary | ICD-10-CM | POA: Diagnosis present

## 2024-07-06 DIAGNOSIS — Z8249 Family history of ischemic heart disease and other diseases of the circulatory system: Secondary | ICD-10-CM

## 2024-07-06 DIAGNOSIS — N1831 Chronic kidney disease, stage 3a: Secondary | ICD-10-CM | POA: Diagnosis present

## 2024-07-06 DIAGNOSIS — R5381 Other malaise: Secondary | ICD-10-CM | POA: Diagnosis present

## 2024-07-06 DIAGNOSIS — Z8261 Family history of arthritis: Secondary | ICD-10-CM

## 2024-07-06 DIAGNOSIS — E785 Hyperlipidemia, unspecified: Secondary | ICD-10-CM | POA: Diagnosis present

## 2024-07-06 DIAGNOSIS — Z1620 Resistance to unspecified antibiotic: Secondary | ICD-10-CM

## 2024-07-06 DIAGNOSIS — E663 Overweight: Secondary | ICD-10-CM | POA: Diagnosis present

## 2024-07-06 DIAGNOSIS — Z818 Family history of other mental and behavioral disorders: Secondary | ICD-10-CM

## 2024-07-06 DIAGNOSIS — F329 Major depressive disorder, single episode, unspecified: Secondary | ICD-10-CM | POA: Diagnosis present

## 2024-07-06 DIAGNOSIS — Z8042 Family history of malignant neoplasm of prostate: Secondary | ICD-10-CM

## 2024-07-06 DIAGNOSIS — J188 Other pneumonia, unspecified organism: Secondary | ICD-10-CM | POA: Diagnosis not present

## 2024-07-06 DIAGNOSIS — Z825 Family history of asthma and other chronic lower respiratory diseases: Secondary | ICD-10-CM

## 2024-07-06 DIAGNOSIS — J189 Pneumonia, unspecified organism: Principal | ICD-10-CM | POA: Diagnosis present

## 2024-07-06 DIAGNOSIS — I129 Hypertensive chronic kidney disease with stage 1 through stage 4 chronic kidney disease, or unspecified chronic kidney disease: Secondary | ICD-10-CM | POA: Diagnosis present

## 2024-07-06 DIAGNOSIS — L4050 Arthropathic psoriasis, unspecified: Secondary | ICD-10-CM | POA: Diagnosis present

## 2024-07-06 DIAGNOSIS — F411 Generalized anxiety disorder: Secondary | ICD-10-CM | POA: Diagnosis present

## 2024-07-06 DIAGNOSIS — M5030 Other cervical disc degeneration, unspecified cervical region: Secondary | ICD-10-CM | POA: Diagnosis present

## 2024-07-06 DIAGNOSIS — Z79899 Other long term (current) drug therapy: Secondary | ICD-10-CM

## 2024-07-06 DIAGNOSIS — D849 Immunodeficiency, unspecified: Secondary | ICD-10-CM | POA: Diagnosis present

## 2024-07-06 DIAGNOSIS — R531 Weakness: Secondary | ICD-10-CM

## 2024-07-06 DIAGNOSIS — Z820 Family history of epilepsy and other diseases of the nervous system: Secondary | ICD-10-CM

## 2024-07-06 DIAGNOSIS — Z1152 Encounter for screening for COVID-19: Secondary | ICD-10-CM

## 2024-07-06 DIAGNOSIS — I10 Essential (primary) hypertension: Secondary | ICD-10-CM | POA: Diagnosis present

## 2024-07-06 DIAGNOSIS — Z6827 Body mass index (BMI) 27.0-27.9, adult: Secondary | ICD-10-CM

## 2024-07-06 LAB — RESP PANEL BY RT-PCR (RSV, FLU A&B, COVID)  RVPGX2
Influenza A by PCR: NEGATIVE
Influenza B by PCR: NEGATIVE
Resp Syncytial Virus by PCR: NEGATIVE
SARS Coronavirus 2 by RT PCR: NEGATIVE

## 2024-07-06 LAB — COMPREHENSIVE METABOLIC PANEL WITH GFR
ALT: 17 U/L (ref 0–44)
AST: 15 U/L (ref 15–41)
Albumin: 3.8 g/dL (ref 3.5–5.0)
Alkaline Phosphatase: 79 U/L (ref 38–126)
Anion gap: 11 (ref 5–15)
BUN: 23 mg/dL (ref 8–23)
CO2: 28 mmol/L (ref 22–32)
Calcium: 10 mg/dL (ref 8.9–10.3)
Chloride: 103 mmol/L (ref 98–111)
Creatinine, Ser: 1.58 mg/dL — ABNORMAL HIGH (ref 0.61–1.24)
GFR, Estimated: 48 mL/min — ABNORMAL LOW (ref >60)
Glucose, Bld: 121 mg/dL — ABNORMAL HIGH (ref 70–99)
Potassium: 4.8 mmol/L (ref 3.5–5.1)
Sodium: 142 mmol/L (ref 135–145)
Total Bilirubin: 0.8 mg/dL (ref 0.0–1.2)
Total Protein: 6.7 g/dL (ref 6.5–8.1)

## 2024-07-06 LAB — CBC WITH DIFFERENTIAL/PLATELET
Abs Immature Granulocytes: 0.03 K/uL (ref 0.00–0.07)
Basophils Absolute: 0.1 K/uL (ref 0.0–0.1)
Basophils Relative: 1 %
Eosinophils Absolute: 0.5 K/uL (ref 0.0–0.5)
Eosinophils Relative: 4 %
HCT: 43.2 % (ref 39.0–52.0)
Hemoglobin: 14.2 g/dL (ref 13.0–17.0)
Immature Granulocytes: 0 %
Lymphocytes Relative: 14 %
Lymphs Abs: 1.6 K/uL (ref 0.7–4.0)
MCH: 30.3 pg (ref 26.0–34.0)
MCHC: 32.9 g/dL (ref 30.0–36.0)
MCV: 92.1 fL (ref 80.0–100.0)
Monocytes Absolute: 1.1 K/uL — ABNORMAL HIGH (ref 0.1–1.0)
Monocytes Relative: 9 %
Neutro Abs: 8.5 K/uL — ABNORMAL HIGH (ref 1.7–7.7)
Neutrophils Relative %: 72 %
Platelets: 380 K/uL (ref 150–400)
RBC: 4.69 MIL/uL (ref 4.22–5.81)
RDW: 13.8 % (ref 11.5–15.5)
WBC: 11.7 K/uL — ABNORMAL HIGH (ref 4.0–10.5)
nRBC: 0 % (ref 0.0–0.2)

## 2024-07-06 LAB — RESPIRATORY PANEL BY PCR

## 2024-07-06 LAB — LACTIC ACID, PLASMA
Lactic Acid, Venous: 1.1 mmol/L (ref 0.5–1.9)
Lactic Acid, Venous: 0.9 mmol/L (ref 0.5–1.9)

## 2024-07-06 LAB — PRO BRAIN NATRIURETIC PEPTIDE: Pro Brain Natriuretic Peptide: <50 pg/mL (ref <300.0)

## 2024-07-06 LAB — TROPONIN T, HIGH SENSITIVITY
Troponin T High Sensitivity: 22 ng/L — ABNORMAL HIGH (ref 0–19)
Troponin T High Sensitivity: 25 ng/L — ABNORMAL HIGH (ref 0–19)

## 2024-07-06 LAB — MAGNESIUM: Magnesium: 2.4 mg/dL (ref 1.7–2.4)

## 2024-07-06 LAB — PROCALCITONIN: Procalcitonin: <0.1 ng/mL

## 2024-07-06 MED ORDER — SODIUM CHLORIDE 0.9 % IV SOLN
1.0000 g | Freq: Once | INTRAVENOUS | Status: AC
  Administered 2024-07-06: 1 g via INTRAVENOUS
  Filled 2024-07-06: qty 20

## 2024-07-06 MED ORDER — GABAPENTIN 100 MG PO CAPS
100.0000 mg | ORAL_CAPSULE | ORAL | Status: AC
  Administered 2024-07-07: 100 mg via ORAL
  Filled 2024-07-06: qty 1

## 2024-07-06 MED ORDER — VANCOMYCIN HCL IN DEXTROSE 1-5 GM/200ML-% IV SOLN
1000.0000 mg | Freq: Once | INTRAVENOUS | Status: AC
  Administered 2024-07-06: 1000 mg via INTRAVENOUS
  Filled 2024-07-06: qty 200

## 2024-07-06 MED ORDER — SENNOSIDES-DOCUSATE SODIUM 8.6-50 MG PO TABS: 1.0000 | ORAL_TABLET | Freq: Every evening | ORAL | Status: DC | PRN

## 2024-07-06 MED ORDER — LACTATED RINGERS IV SOLN: INTRAVENOUS | Status: DC

## 2024-07-06 MED ORDER — IPRATROPIUM-ALBUTEROL 0.5-2.5 (3) MG/3ML IN SOLN
3.0000 mL | Freq: Four times a day (QID) | RESPIRATORY_TRACT | Status: DC | PRN
Start: 2024-07-06 — End: 2024-07-08

## 2024-07-06 MED ORDER — DULOXETINE HCL 30 MG PO CPEP: 60.0000 mg | ORAL_CAPSULE | Freq: Every day | ORAL | Status: DC

## 2024-07-06 MED ORDER — DULOXETINE HCL 30 MG PO CPEP
30.0000 mg | ORAL_CAPSULE | Freq: Every day | ORAL | Status: DC
  Administered 2024-07-06 – 2024-07-08 (×3): 30 mg via ORAL
  Filled 2024-07-06 (×3): qty 1

## 2024-07-06 MED ORDER — GABAPENTIN 100 MG PO CAPS: 100.0000 mg | ORAL_CAPSULE | Freq: Four times a day (QID) | ORAL | Status: DC

## 2024-07-06 MED ORDER — ATORVASTATIN CALCIUM 10 MG PO TABS
10.0000 mg | ORAL_TABLET | Freq: Every day | ORAL | Status: DC
  Administered 2024-07-06 – 2024-07-08 (×3): 10 mg via ORAL
  Filled 2024-07-06 (×3): qty 1

## 2024-07-06 MED ORDER — ACETAMINOPHEN 650 MG RE SUPP: 650.0000 mg | Freq: Four times a day (QID) | RECTAL | Status: DC | PRN

## 2024-07-06 MED ORDER — BUDESONIDE 0.5 MG/2ML IN SUSP: 0.5000 mg | Freq: Once | RESPIRATORY_TRACT | Status: DC

## 2024-07-06 MED ORDER — GABAPENTIN 400 MG PO CAPS
400.0000 mg | ORAL_CAPSULE | Freq: Every day | ORAL | Status: DC
  Administered 2024-07-06 – 2024-07-07 (×2): 400 mg via ORAL
  Filled 2024-07-06 (×2): qty 1

## 2024-07-06 MED ORDER — SODIUM CHLORIDE 0.9% FLUSH
3.0000 mL | Freq: Two times a day (BID) | INTRAVENOUS | Status: DC
  Administered 2024-07-06 – 2024-07-08 (×3): 3 mL via INTRAVENOUS

## 2024-07-06 MED ORDER — ENOXAPARIN SODIUM 40 MG/0.4ML IJ SOSY
40.0000 mg | PREFILLED_SYRINGE | INTRAMUSCULAR | Status: DC
  Administered 2024-07-06 – 2024-07-07 (×2): 40 mg via SUBCUTANEOUS
  Filled 2024-07-06 (×2): qty 0.4

## 2024-07-06 MED ORDER — SODIUM CHLORIDE 3 % IN NEBU
4.0000 mL | INHALATION_SOLUTION | Freq: Once | RESPIRATORY_TRACT | Status: DC
  Filled 2024-07-06 (×2): qty 4

## 2024-07-06 MED ORDER — ACETAMINOPHEN 325 MG PO TABS: 650.0000 mg | ORAL_TABLET | Freq: Four times a day (QID) | ORAL | Status: DC | PRN

## 2024-07-06 NOTE — ED Notes (Signed)
 Called [facility] for central monitoring at this time

## 2024-07-06 NOTE — ED Triage Notes (Signed)
 C/O general fatigue, weakness, low grade fever for a while.  3 months ago had two bouts of pneumonia completed two rounds of antibiotics. Had an outpatient CT last week of chest and something new is going on.  Has appointment with Pulmonology on 07/14/2024,  and was referred to ED for CXR and cultures today.  Presents AAOx3. Skin pale, warm and dry. No SOB/ DOE

## 2024-07-06 NOTE — Plan of Care (Signed)

## 2024-07-06 NOTE — ED Notes (Signed)
Urine sample sent at this time.

## 2024-07-06 NOTE — H&P (Signed)
 History and Physical    Benjamin Fields FMW:[id number] DOB: [birth year] DOA: 07/06/2024  DOS: the patient was seen and examined on 07/06/2024  PCP: Wendee Lynwood HERO, NP   Patient coming from: Home  I have personally briefly reviewed patient's old medical records in [facility] and CareEverywhere  HPI:   Benjamin Fields is a 63 y.o. year old male with medical history of HTN, HLD, MDD, GAD and RA presenting to the ED with malaise, fatigue and low grad fevers. He was treated for pneumonia in July with levaquin  and August with Augmentin . He saw pulmonology outpatient for this and has an appointment coming up.   Pt states he has not recovered since his initial pneumonia. He has been having high temperature with fever up to 101.1. Has been coughing that is dry, weakness and malaise. Not exposure to farm animals, no recent travels. No sick contacts.   He reports he felt back to baseline when he was started on prednisone  but not with antibiotics.   On arrival to the ED patient was noted to be HDS stable. Lab work and imaging obtained. CBC with mild leukocytosis, CMP with elevated creatinine that appears chronic in nature in CDKIIIa. LA normal, BNP normal, Troponin normal. CXR with concern for multifocal pneumonia and recent CT of chest demonstrates the same. This appears to be recurrent issue so [facility] contacted for admission.  Review of Systems: As mentioned in the history of present illness. All other systems reviewed and are negative.   Past Medical History:  Diagnosis Date   Allergy     mild- no meds    Anxiety 03/09/18   Depression 03/09/2018   Hypertension 01/08/16   Neuromuscular disorder (HCC) 05/30/2018   mild nerve issue right leg    RA (rheumatoid arthritis) (HCC)     Past Surgical History:  Procedure Laterality Date   CERVICAL SPINE SURGERY  2010   COLONOSCOPY     COLONOSCOPY  04/2020   IR INJECT/THERA/INC NEEDLE/CATH/PLC EPI/LUMB/SAC W/IMG  05/05/2024   ROTATOR CUFF REPAIR  Right    SPINE SURGERY  12/2004   TONSILLECTOMY       No Known Allergies  Family History  Problem Relation Age of Onset   Parkinson's disease Mother    Atrial fibrillation Mother    Arthritis Mother    Depression Mother    Heart failure Father    Hypertension Father    Prostate cancer Father 73   COPD Father    Heart disease Father    Colon cancer Neg Hx    Colon polyps Neg Hx    Esophageal cancer Neg Hx    Rectal cancer Neg Hx    Stomach cancer Neg Hx     Prior to Admission medications   Medication Sig Start Date End Date Taking? Authorizing Provider  albuterol  (VENTOLIN  HFA) 108 (90 Base) MCG/ACT inhaler Inhale 2 puffs into the lungs every 6 (six) hours as needed for wheezing or shortness of breath. 04/13/24   Kasa, Kurian, MD  atorvastatin  (LIPITOR) 10 MG tablet Take 1 tablet (10 mg total) by mouth daily. 04/02/24   Wendee Lynwood HERO, NP  DULoxetine  (CYMBALTA ) 60 MG capsule Take 1 capsule (60 mg total) by mouth daily. 04/05/24   Wendee Lynwood HERO, NP  gabapentin  (NEURONTIN ) 100 MG capsule TAKE 1 CAPSULE BY MOUTH IN THE MORNING AND 4 EVERY DAY AT BEDTIME 05/31/24   Wendee Lynwood HERO, NP  losartan  (COZAAR ) 25 MG tablet Take 1 tablet (25 mg total) by  mouth daily. 08/08/23   Wendee Lynwood HERO, NP  methocarbamol  (ROBAXIN ) 500 MG tablet Take 1 tablet (500 mg total) by mouth every 8 (eight) hours as needed for muscle spasms. Can cause drowsiness. 04/29/24   Hilma Hastings, PA-C  Multiple Vitamin (MULTIVITAMIN) tablet Take 1 tablet by mouth daily. OTC    [provider]    Social History:  reports that he has never smoked. He has never used smokeless tobacco. He reports current alcohol use. He reports that he does not use drugs. Lives with wife, has a dog.  Tobacco- Denies use. EtOH-  Denies use.  Illicit drug use- denies use.  IADLs/ADLs- can perform independently at baseline    Physical Exam: Vitals:   07/06/24 1030 07/06/24 1100 07/06/24 1200 07/06/24 1300  BP: 118/75 124/72 122/75  111/69  Pulse: 87 89 (!) 101 95  Resp: (!) 23 (!) 28 (!) 24 (!) 26  Temp:      TempSrc:      SpO2: 94% 95% 94% 98%  Weight:        Physical Exam General: NAD HENT: NCAT Lungs: CTAB, no wheeze or rhonchi Cardiovascular: Normal heart sounds, no r/m/g, 2+ pulses in all extremities. No LE edema Abdomen: No TTP, normal bowel sounds MSK: No asymmetry or muscle atrophy.  Skin: no lesions noted on exposed skin Neuro: Alert and oriented x4. CN grossly intact Psych: Normal mood and normal affect    Labs on Admission: I have personally reviewed following labs and imaging studies  CBC: Recent Labs  Lab 07/06/24 0906  WBC 11.7*  NEUTROABS 8.5*  HGB 14.2  HCT 43.2  MCV 92.1  PLT 380   Basic Metabolic Panel: Recent Labs  Lab 07/05/24 0810 07/06/24 0906  NA 139 142  K 4.9 4.8  CL 101 103  CO2 27 28  GLUCOSE 116* 121*  BUN 21 23  CREATININE 1.64* 1.58*  CALCIUM  9.2 10.0   GFR: Estimated Creatinine Clearance: 48.1 mL/min (A) (by C-G formula based on SCr of 1.58 mg/dL (H)). Liver Function Tests: Recent Labs  Lab 07/05/24 0810 07/06/24 0906  AST 10 15  ALT 14 17  ALKPHOS 65 79  BILITOT 1.1 0.8  PROT 5.8* 6.7  ALBUMIN 3.7 3.8   No results for input(s): LIPASE, AMYLASE in the last 168 hours. No results for input(s): AMMONIA in the last 168 hours. Coagulation Profile: No results for input(s): INR, PROTIME in the last 168 hours. Cardiac Enzymes: No results for input(s): CKTOTAL, CKMB, CKMBINDEX, TROPONINI, TROPONINIHS in the last 168 hours. BNP (last 3 results) No results for input(s): BNP in the last 8760 hours. HbA1C: Recent Labs    07/05/24 0810  HGBA1C 6.3   CBG: No results for input(s): GLUCAP in the last 168 hours. Lipid Profile: Recent Labs    07/05/24 0810  CHOL 115  HDL 31.40*  LDLCALC 66  TRIG 90.0  CHOLHDL 4   Thyroid  Function Tests: No results for input(s): TSH, T4TOTAL, FREET4, T3FREE, THYROIDAB in the  last 72 hours. Anemia Panel: No results for input(s): VITAMINB12, FOLATE, FERRITIN, TIBC, IRON, RETICCTPCT in the last 72 hours. Urine analysis:    Component Value Date/Time   COLORURINE YELLOW (A) 05/28/2023 1802   APPEARANCEUR CLEAR (A) 05/28/2023 1802   LABSPEC 1.025 05/28/2023 1802   PHURINE 5.0 05/28/2023 1802   GLUCOSEU NEGATIVE 05/28/2023 1802   GLUCOSEU NEGATIVE 03/14/2021 0841   HGBUR NEGATIVE 05/28/2023 1802   BILIRUBINUR NEGATIVE 05/28/2023 1802   BILIRUBINUR neg 05/26/2023 0945  KETONESUR NEGATIVE 05/28/2023 1802   PROTEINUR NEGATIVE 05/28/2023 1802   UROBILINOGEN 0.2 05/26/2023 0945   UROBILINOGEN 0.2 03/14/2021 0841   NITRITE NEGATIVE 05/28/2023 1802   LEUKOCYTESUR NEGATIVE 05/28/2023 1802    Radiological Exams on Admission: I have personally reviewed images DG Chest Port 1 View Result Date: 07/06/2024 CLINICAL DATA:  Weakness and fever. EXAM: PORTABLE CHEST 1 VIEW COMPARISON:  03/12/2024 FINDINGS: Multifocal airspace opacity bilaterally with nodular appearance in some areas. Findings better characterized on recent chest CT of 06/30/2024. Low lung volumes. S[facility] cardiomegaly Telemetry leads overlie the chest. IMPRESSION: Multifocal airspace opacity bilaterally with nodular appearance in some areas. Findings better characterized on recent chest CT of 06/30/2024 as likely infectious/inflammatory etiology. Electronically Signed   By: Camellia Candle M.D.   On: 07/06/2024 09:49    EKG: My personal interpretation of EKG shows: sinus rhythm without acute ST changes.     Assessment/Plan Principal Problem:   Multifocal pneumonia Active Problems:   DDD (degenerative disc disease), cervical   Essential hypertension   Rheumatoid arthritis of multiple sites with negative rheumatoid factor (HCC)   Patient with symptoms of weakness, malaise, dyspnea on exertion and subjective fevers presented to the ED found to have concerns for multifocal pneumonia.  Patient has  received outpatient treatment for this and has been seen by pulmonology as outpatient as well.  They did order repeat CT which has resulted today.  He is overall well-appearing and satting well on room air.  The differential diagnosis for this is broad and includes bacterial pneumonia versus fungal pneumonia versus viral pneumonia versus organizing pneumonia given history of rheumatoid arthritis.  Given he has completed antibiotics my concern for bacterial pneumonia is lower.  My concern for organizing pneumonia is higher given his RA and response to steroids. Blood cultures have been ordered and sputum culture is ordered although hold further antibiotics given his nontoxic appearance.  Will check viral panel and other lab work to look for fungal infection.  Will also consult pulmonology for their opinion.   Elevated creatinine: Could be CKD versus persistent AKI as patient has been dealing with some illness for the last few months.  He is on IVF and will monitor his renal function.  Chronic problems: Hyperlipidemia: Continue home statin Hypertension: Continue home antihypertensives, may need to hold if patient has poor intake DDD: Continue home regimen Rheumatoid arthritis: Sees rheumatology and gets prednisone  taper as needed.  VTE prophylaxis:  Lovenox   Diet: Regular Code Status:  Full Code Telemetry:  Admission status: Inpatient, Med-Surg Patient is from: Home Anticipated d/c is to: Home Anticipated d/c is in: 2-3 days   Family Communication: Updated at bedside  Consults called: None   Severity of Illness: The appropriate patient status for this patient is INPATIENT. Inpatient status is judged to be reasonable and necessary in order to provide the required intensity of service to ensure the patient's safety. The patient's presenting symptoms, physical exam findings, and initial radiographic and laboratory data in the context of their chronic comorbidities is felt to place them at high  risk for further clinical deterioration. Furthermore, it is not anticipated that the patient will be medically stable for discharge from the hospital within 2 midnights of admission.   * I certify that at the point of admission it is my clinical judgment that the patient will require inpatient hospital care spanning beyond 2 midnights from the point of admission due to high intensity of service, high risk for further deterioration and high frequency of  surveillance required.DEWAINE Morene Bathe, MD Jolynn DEL. [facility]

## 2024-07-06 NOTE — ED Provider Notes (Signed)
 [facility] Provider Note   Event Date/Time   First MD Initiated Contact with Patient 07/06/24 [phone]     (approximate) History  Weakness  HPI Benjamin Fields is a 63 y.o. male with a past medical history of hypertension, anxiety, rheumatoid arthritis, and neuromuscular disorder who presents at the request of his pulmonologist due to persistent multifocal pneumonia seen on recent CT scan.  Patient was treated for multifocal pneumonia throughout August with the last antibiotic use to being at the end of August.  Patient was also hospitalized during this time due to hypoxia.  Since then patient has been complaining of daily fevers, persistent weakness, and persistent dyspnea on exertion.  Patient had a repeat CT scan recently that was read by a cardiologist and concerning for multifocal pneumonia.  Given patient's persistence of symptoms, he was told by his pulmonologist to present to the emergency department for chest x-ray and cultures. ROS: Patient currently denies any vision changes, tinnitus, difficulty speaking, facial droop, sore throat, chest pain, abdominal pain, nausea/vomiting/diarrhea, dysuria, or weakness/numbness/paresthesias in any extremity   Physical Exam  Triage Vital Signs: ED Triage Vitals  Encounter Vitals Group     BP 07/06/24 0853 134/79     Girls Systolic BP Percentile --      Girls Diastolic BP Percentile --      Boys Systolic BP Percentile --      Boys Diastolic BP Percentile --      Pulse Rate 07/06/24 0853 (!) 108     Resp 07/06/24 0853 16     Temp 07/06/24 0853 98 F (36.7 C)     Temp Source 07/06/24 0853 Oral     SpO2 07/06/24 0854 98 %     Weight 07/06/24 0853 187 lb 6.3 oz (85 kg)     Height --      Head Circumference --      Peak Flow --      Pain Score 07/06/24 0853 0     Pain Loc --      Pain Education --      Exclude from Growth Chart --    Most recent vital signs: Vitals:   07/06/24 1900 07/07/24 0313  BP: 112/63  134/73  Pulse: 94 96  Resp: 16 18  Temp: 99.3 F (37.4 C) 99.5 F (37.5 C)  SpO2: 97% 94%   General: Awake, oriented x4. CV:  Good peripheral perfusion. Resp:  Mildly increased effort.  Rhonchi over bilateral lung fields Abd:  No distention. Other:  Elderly overweight Caucasian male resting comfortably in no acute distress ED Results / Procedures / Treatments  Labs (all labs ordered are listed, but only abnormal results are displayed) Labs Reviewed  COMPREHENSIVE METABOLIC PANEL WITH GFR - Abnormal; Notable for the following components:      Result Value   Glucose, Bld 121 (*)    Creatinine, Ser 1.58 (*)    GFR, Estimated 48 (*)    All other components within normal limits  CBC WITH DIFFERENTIAL/PLATELET - Abnormal; Notable for the following components:   WBC 11.7 (*)    Neutro Abs 8.5 (*)    Monocytes Absolute 1.1 (*)    All other components within normal limits  BASIC METABOLIC PANEL WITH GFR - Abnormal; Notable for the following components:   Glucose, Bld 114 (*)    Creatinine, Ser 1.46 (*)    Calcium  8.7 (*)    GFR, Estimated 53 (*)    All other components within  normal limits  CBC - Abnormal; Notable for the following components:   WBC 10.6 (*)    RBC 3.94 (*)    Hemoglobin 12.1 (*)    HCT 35.6 (*)    All other components within normal limits  TROPONIN T, HIGH SENSITIVITY - Abnormal; Notable for the following components:   Troponin T High Sensitivity 22 (*)    All other components within normal limits  TROPONIN T, HIGH SENSITIVITY - Abnormal; Notable for the following components:   Troponin T High Sensitivity 25 (*)    All other components within normal limits  RESP PANEL BY RT-PCR (RSV, FLU A&B, COVID)  RVPGX2  RESPIRATORY PANEL BY PCR  CULTURE, BLOOD (ROUTINE X 2)  CULTURE, BLOOD (ROUTINE X 2)  EXPECTORATED SPUTUM ASSESSMENT W GRAM STAIN, RFLX TO RESP C  LACTIC ACID, PLASMA  LACTIC ACID, PLASMA  PRO BRAIN NATRIURETIC PEPTIDE  PROCALCITONIN  MAGNESIUM    HISTOPLASMA ANTIGEN, URINE  HISTOPLASMA GAL'MANNAN AG SER  LEGIONELLA PNEUMOPHILA SEROGP 1 UR AG  FUNGITELL BETA-D-GLUCAN  CRYPTOCOCCUS ANTIGEN, SERUM   EKG ED ECG REPORT I, Artist MARLA Kerns, the attending physician, personally viewed and interpreted this ECG. Date: 07/06/2024 EKG Time: 0853 Rate: 107 Rhythm: Tachycardic sinus rhythm QRS Axis: normal Intervals: normal ST/T Wave abnormalities: normal Narrative Interpretation: Tachycardic sinus rhythm.  No evidence of acute ischemia RADIOLOGY ED MD interpretation: Single view portable chest x-ray shows multifocal airspace opacities bilaterally with nodular appearance and some areas - All radiology independently interpreted and agree with radiology assessment Official radiology report(s): DG Chest Port 1 View Result Date: 07/06/2024 CLINICAL DATA:  Weakness and fever. EXAM: PORTABLE CHEST 1 VIEW COMPARISON:  03/12/2024 FINDINGS: Multifocal airspace opacity bilaterally with nodular appearance in some areas. Findings better characterized on recent chest CT of 06/30/2024. Low lung volumes. [facility] cardiomegaly Telemetry leads overlie the chest. IMPRESSION: Multifocal airspace opacity bilaterally with nodular appearance in some areas. Findings better characterized on recent chest CT of 06/30/2024 as likely infectious/inflammatory etiology. Electronically Signed   By: Camellia Candle M.D.   On: 07/06/2024 09:49   PROCEDURES: Critical Care performed: No Procedures MEDICATIONS ORDERED IN ED: Medications  lactated ringers infusion ( Intravenous New Bag/Given 07/07/24 0309)  sodium chloride  flush (NS) 0.9 % injection 3 mL (3 mLs Intravenous Given 07/06/24 2123)  acetaminophen  (TYLENOL ) tablet 650 mg (has no administration in time range)    Or  acetaminophen  (TYLENOL ) suppository 650 mg (has no administration in time range)  senna-docusate (Senokot-S) tablet 1 tablet (has no administration in time range)  enoxaparin  (LOVENOX ) injection 40 mg (40 mg  Subcutaneous Given 07/06/24 1657)  ipratropium-albuterol  (DUONEB) 0.5-2.5 (3) MG/3ML nebulizer solution 3 mL (has no administration in time range)  atorvastatin  (LIPITOR) tablet 10 mg (10 mg Oral Given 07/06/24 2119)  DULoxetine  (CYMBALTA ) DR capsule 30 mg (30 mg Oral Given 07/06/24 2119)  sodium chloride  HYPERTONIC 3 % nebulizer solution 4 mL (4 mLs Nebulization Not Given 07/06/24 2336)  gabapentin  (NEURONTIN ) capsule 100 mg (has no administration in time range)  gabapentin  (NEURONTIN ) capsule 400 mg (400 mg Oral Given 07/06/24 2119)  vancomycin (VANCOCIN) IVPB 1000 mg/200 mL premix (0 mg Intravenous Stopped 07/06/24 1330)  meropenem (MERREM) 1 g in sodium chloride  0.9 % 100 mL IVPB (0 g Intravenous Stopped 07/06/24 1221)   IMPRESSION / MDM / ASSESSMENT AND PLAN / ED COURSE  I reviewed the triage vital signs and the nursing notes.  The patient is on the cardiac monitor to evaluate for evidence of arrhythmia and/or significant heart rate changes. Patient's presentation is most consistent with acute presentation with potential threat to life or bodily function. With the above-stated past medical history who presents at the request of his pulmonologist due to persistent symptoms of weakness and dyspnea on exertion as well as daily fevers and CT evidence of persistent multifocal pneumonia. DDx: Sepsis, multifocal pneumonia, autoimmune pneumonitis, PE Plan: CBC, CMP, lactic acid, blood culture x 2, BNP, troponin, chest x-ray, EKG  Laboratory evaluation reveals leukocytosis to 11, creatinine 1.58 (baseline), troponin 25 (baseline).  Chest x-ray showing multifocal pneumonia bilaterally and will treat empirically with sepsis order set including vancomycin and meropenem given the failure of outpatient antibiotics.  Patient is remained hemodynamically stable throughout his emergency department course.  Discussed with the on-call hospitalist who has agreed to accept this patient  for further evaluation and management.  Dispo: Admit to medicine   FINAL CLINICAL IMPRESSION(S) / ED DIAGNOSES   Final diagnoses:  Multifocal pneumonia  Therapy failure due to antibiotic resistance  Generalized weakness   Rx / DC Orders   ED Discharge Orders     None      Note:  This document was prepared using Dragon voice recognition software and may include unintentional dictation errors.   Jossie Artist POUR, MD 07/07/24 [phone]

## 2024-07-07 ENCOUNTER — Ambulatory Visit: Payer: Self-pay | Admitting: Nurse Practitioner

## 2024-07-07 DIAGNOSIS — I10 Essential (primary) hypertension: Secondary | ICD-10-CM | POA: Diagnosis not present

## 2024-07-07 DIAGNOSIS — N1831 Chronic kidney disease, stage 3a: Secondary | ICD-10-CM

## 2024-07-07 DIAGNOSIS — M0609 Rheumatoid arthritis without rheumatoid factor, multiple sites: Secondary | ICD-10-CM | POA: Diagnosis not present

## 2024-07-07 DIAGNOSIS — J188 Other pneumonia, unspecified organism: Secondary | ICD-10-CM | POA: Diagnosis not present

## 2024-07-07 HISTORY — DX: Chronic kidney disease, stage 3a: N18.31

## 2024-07-07 LAB — BASIC METABOLIC PANEL WITH GFR
Anion gap: 10 (ref 5–15)
BUN: 21 mg/dL (ref 8–23)
CO2: 25 mmol/L (ref 22–32)
Calcium: 8.7 mg/dL — ABNORMAL LOW (ref 8.9–10.3)
Chloride: 104 mmol/L (ref 98–111)
Creatinine, Ser: 1.46 mg/dL — ABNORMAL HIGH (ref 0.61–1.24)
GFR, Estimated: 53 mL/min — ABNORMAL LOW (ref >60)
Glucose, Bld: 114 mg/dL — ABNORMAL HIGH (ref 70–99)
Potassium: 4.3 mmol/L (ref 3.5–5.1)
Sodium: 139 mmol/L (ref 135–145)

## 2024-07-07 LAB — HISTOPLASMA ANTIGEN, URINE: Histoplasma Antigen, urine: NEGATIVE (ref <0.2)

## 2024-07-07 LAB — LEGIONELLA PNEUMOPHILA SEROGP 1 UR AG: L. pneumophila Serogp 1 Ur Ag: NEGATIVE

## 2024-07-07 LAB — MRSA NEXT GEN BY PCR, NASAL: MRSA by PCR Next Gen: NOT DETECTED

## 2024-07-07 LAB — CBC
HCT: 35.6 % — ABNORMAL LOW (ref 39.0–52.0)
Hemoglobin: 12.1 g/dL — ABNORMAL LOW (ref 13.0–17.0)
MCH: 30.7 pg (ref 26.0–34.0)
MCHC: 34 g/dL (ref 30.0–36.0)
MCV: 90.4 fL (ref 80.0–100.0)
Platelets: 326 K/uL (ref 150–400)
RBC: 3.94 MIL/uL — ABNORMAL LOW (ref 4.22–5.81)
RDW: 13.6 % (ref 11.5–15.5)
WBC: 10.6 K/uL — ABNORMAL HIGH (ref 4.0–10.5)
nRBC: 0 % (ref 0.0–0.2)

## 2024-07-07 LAB — C-REACTIVE PROTEIN: CRP: 9.1 mg/dL — ABNORMAL HIGH (ref <1.0)

## 2024-07-07 LAB — SEDIMENTATION RATE: Sed Rate: 65 mm/h — ABNORMAL HIGH (ref 0–20)

## 2024-07-07 LAB — GLUCOSE, CAPILLARY: Glucose-Capillary: 116 mg/dL — ABNORMAL HIGH (ref 70–99)

## 2024-07-07 LAB — PRO BRAIN NATRIURETIC PEPTIDE: Pro Brain Natriuretic Peptide: <50 pg/mL (ref <300.0)

## 2024-07-07 MED ORDER — VANCOMYCIN HCL 2000 MG/400ML IV SOLN
2000.0000 mg | Freq: Once | INTRAVENOUS | Status: AC
  Administered 2024-07-07: 2000 mg via INTRAVENOUS
  Filled 2024-07-07: qty 400

## 2024-07-07 MED ORDER — PIPERACILLIN-TAZOBACTAM 3.375 G IVPB
3.3750 g | Freq: Three times a day (TID) | INTRAVENOUS | Status: DC
  Administered 2024-07-07 – 2024-07-08 (×4): 3.375 g via INTRAVENOUS
  Filled 2024-07-07 (×5): qty 50

## 2024-07-07 MED ORDER — SODIUM CHLORIDE 0.9 % IV SOLN: INTRAVENOUS | Status: DC | PRN

## 2024-07-07 MED ORDER — VANCOMYCIN HCL 1500 MG/300ML IV SOLN
1500.0000 mg | INTRAVENOUS | Status: DC
  Filled 2024-07-07: qty 300

## 2024-07-07 MED ORDER — SODIUM CHLORIDE 0.9 % IV SOLN
500.0000 mg | INTRAVENOUS | Status: DC
  Administered 2024-07-07: 500 mg via INTRAVENOUS
  Filled 2024-07-07 (×3): qty 5

## 2024-07-07 MED ORDER — ONDANSETRON HCL 4 MG/2ML IJ SOLN
4.0000 mg | INTRAMUSCULAR | Status: DC | PRN
  Administered 2024-07-07: 4 mg via INTRAVENOUS
  Filled 2024-07-07: qty 2

## 2024-07-07 NOTE — Progress Notes (Signed)
  Progress Note   Patient: Benjamin Fields FMW:[id number] DOB: 11-10-1960 DOA: 07/06/2024     0 DOS: the patient was seen and examined on 07/07/2024   Brief hospital course: ELIA KEENUM is a 63 y.o. year old male with medical history of HTN, HLD, MDD, GAD and RA presenting to the ED with malaise, fatigue and low grad fevers. He was treated for pneumonia in July with levaquin  and August with Augmentin .  He states that he had a high fever 101 about 3 days ago.  He still have a dry cough.  Chest x-ray showed multifocal pneumonia.  Pulmonology consult obtained.   Principal Problem:   Multifocal pneumonia Active Problems:   DDD (degenerative disc disease), cervical   Essential hypertension   Rheumatoid arthritis of multiple sites with negative rheumatoid factor (HCC)   CKD stage 3a, GFR 45-59 ml/min (HCC)   Assessment and Plan: Multifocal pneumonia. Reviewed her prior CT scan a week ago, x-ray performed admission.  Patient has multifocal infiltrates, currently does not have fever for the last 48 hours. Etiology still unclear, fungal is in differential.  Appreciate pulmonology consult, currently recommended broad-spectrum antibiotic coverage with Zosyn and vancomycin while working out the cause. BNP not elevated, no concerning for congestive heart failure.  Rheumatoid arthritis. Patient is immunocompromised from prior treatment.  Chronic kidney disease stage IIIa. Renal function still stable.  Essential hypertension. Continue some home medicines.     Subjective:  Patient has some short of breath with exertion, minimal cough.  No fever or chills.  Physical Exam: Vitals:   07/07/24 0313 07/07/24 0500 07/07/24 0733 07/07/24 1422  BP: 134/73  130/75 135/79  Pulse: 96  83 98  Resp: 18  17 17   Temp: 99.5 F (37.5 C)  98.8 F (37.1 C) 98.3 F (36.8 C)  TempSrc:      SpO2: 94%  92% 96%  Weight:  84.5 kg     General exam: Appears calm and comfortable  Respiratory system:  Clear to auscultation. Respiratory effort normal. Cardiovascular system: S1 & S2 heard, RRR. No JVD, murmurs, rubs, gallops or clicks. No pedal edema. Gastrointestinal system: Abdomen is nondistended, soft and nontender. No organomegaly or masses felt. Normal bowel sounds heard. Central nervous system: Alert and oriented. No focal neurological deficits. Extremities: Symmetric 5 x 5 power. Skin: No rashes, lesions or ulcers Psychiatry: Judgement and insight appear normal. Mood & affect appropriate.    Data Reviewed:  Reviewed CT scan results, x-ray results, reviewed all images. Reviewed lab results.  Family Communication: Wife updated at bedside.  Disposition: Status is: Inpatient Remains inpatient appropriate because: Severity of disease, IV treatment     Time spent: 35 minutes  Author: Murvin Mana, MD 07/07/2024 4:27 PM  For on call review [email or web address].

## 2024-07-07 NOTE — Care Management Obs Status (Signed)
 MEDICARE OBSERVATION STATUS NOTIFICATION   Patient Details  Name: Benjamin Fields MRN: [id number] Date of Birth: [birth year]   Medicare Observation Status Notification Given:  Yes    Salimatou Simone W, CMA 07/07/2024, 12:13 PM

## 2024-07-07 NOTE — Hospital Course (Addendum)
 Benjamin Fields is a 63 y.o. year old male with medical history of HTN, HLD, MDD, GAD and RA presenting to the ED with malaise, fatigue and low grad fevers. He was treated for pneumonia in July with levaquin  and August with Augmentin .  He states that he had a high fever 101 about 3 days ago.  He still have a dry cough.  Chest x-ray showed multifocal pneumonia.  Pulmonology consult obtained. Patient is treated with abx.  Symptom improved

## 2024-07-07 NOTE — Plan of Care (Signed)
  Problem: Clinical Measurements: Goal: Ability to maintain clinical measurements within normal limits will improve Outcome: Progressing   Problem: Clinical Measurements: Goal: Respiratory complications will improve Outcome: Progressing   Problem: Clinical Measurements: Goal: Cardiovascular complication will be avoided Outcome: Progressing   Problem: Activity: Goal: Risk for activity intolerance will decrease Outcome: Progressing   Problem: Pain Managment: Goal: General experience of comfort will improve and/or be controlled Outcome: Progressing   Problem: Safety: Goal: Ability to remain free from injury will improve Outcome: Progressing

## 2024-07-07 NOTE — Consult Note (Addendum)
 Pharmacy Antibiotic Note  Benjamin Fields is a 63 y.o. male admitted on 07/06/2024 with multifocal pneumonia.  Pharmacy has been consulted for vancomycin and Zosyn dosing.  Weight: 84.5 kg (186 lb 4.6 oz)  Plan:  Zosyn 3.375G q8h  Vancomycin 2000 mg LD scheduled to be given at 1730 on 11/12 Vancomycin 1500 mg IV Q 24 hrs ordered to start at 1800 on 11/13 . Goal AUC 400-550. Obtain levels at steady state or as clinically indicated.   Expected AUC: 517.6 SCr used: 1.46  Weight used for dosing: IBW Cmin: 12.4 Will monitor serum creatinine daily while on vanc. Follow renal function and cultures for adjustments      Weight: 84.5 kg (186 lb 4.6 oz)  Temp (24hrs), Avg:99 F (37.2 C), Min:98.3 F (36.8 C), Max:99.5 F (37.5 C)  Recent Labs  Lab 07/05/24 0810 07/06/24 0906 07/06/24 1059 07/07/24 0350  WBC  --  11.7*  --  10.6*  CREATININE 1.64* 1.58*  --  1.46*  LATICACIDVEN  --  1.1 0.9  --     Estimated Creatinine Clearance: 52.1 mL/min (A) (by C-G formula based on SCr of 1.46 mg/dL (H)).    No Known Allergies  Antimicrobials this admission: 11/12 Vancomycin >>  11/12 Zosyn >>  11/12 Azithromycin >>  Microbiology results: 11/11 BCx: NGTD 11/12 MRSA PCR: pending  Thank you for allowing pharmacy to be a part of this patient's care.  Annabella LOISE Banks, PharmD Clinical Pharmacist 07/07/2024 5:04 PM

## 2024-07-07 NOTE — Plan of Care (Signed)
  Problem: Education: Goal: Knowledge of General Education information will improve Description: Including pain rating scale, medication(s)/side effects and non-pharmacologic comfort measures Outcome: Progressing   Problem: Health Behavior/Discharge Planning: Goal: Ability to manage health-related needs will improve Outcome: Progressing   Problem: Nutrition: Goal: Adequate nutrition will be maintained Outcome: Progressing   Problem: Activity: Goal: Risk for activity intolerance will decrease Outcome: Progressing   Problem: Coping: Goal: Level of anxiety will decrease Outcome: Progressing

## 2024-07-07 NOTE — Consult Note (Addendum)
 PULMONOLOGY         Date: 07/07/2024,   MRN# [id number] Benjamin Fields 24-Oct-1960     AdmissionWeight: 85 kg                 CurrentWeight: 84.5 kg  Referring provider: Dr Laurita   CHIEF COMPLAINT:   Acute respiratory distress with multifocal pneumonia    HISTORY OF PRESENT ILLNESS   This is a 63 yo M with hx of HTN, dyslipidemia, anxiety disorder, rheumatoid arthritis who came in to ER with complaints of fatigue, and dyspnea.  He was previously seen by pulmonology but did not follow up. He had CT chest with findings of Multifocal patchy/nodular opacities with surrounding ground glass in the lungs bilaterally, new from recent prior, favoring multifocal infection/pneumonia. Atypical/fungal etiologies are possible. No pleural effusion or pneumothorax. He had leukocytosis on arrival to hospital and is being treated empirically with hypertonic saline.  PCCM consultation placed for additional evaluation and management.   PAST MEDICAL HISTORY   Past Medical History:  Diagnosis Date   Allergy     mild- no meds    Anxiety 03/09/18   Depression 03/09/2018   Hypertension 01/08/16   Neuromuscular disorder (HCC) 05/30/2018   mild nerve issue right leg    RA (rheumatoid arthritis) (HCC)      SURGICAL HISTORY   Past Surgical History:  Procedure Laterality Date   CERVICAL SPINE SURGERY  2010   COLONOSCOPY     COLONOSCOPY  04/2020   IR INJECT/THERA/INC NEEDLE/CATH/PLC EPI/LUMB/SAC W/IMG  05/05/2024   ROTATOR CUFF REPAIR Right    SPINE SURGERY  12/2004   TONSILLECTOMY       FAMILY HISTORY   Family History  Problem Relation Age of Onset   Parkinson's disease Mother    Atrial fibrillation Mother    Arthritis Mother    Depression Mother    Heart failure Father    Hypertension Father    Prostate cancer Father 23   COPD Father    Heart disease Father    Colon cancer Neg Hx    Colon polyps Neg Hx    Esophageal cancer Neg Hx    Rectal cancer Neg Hx    Stomach  cancer Neg Hx      SOCIAL HISTORY   Social History   Tobacco Use   Smoking status: Never   Smokeless tobacco: Never  Vaping Use   Vaping status: Never Used  Substance Use Topics   Alcohol use: Yes    Comment: rare wine during the holiday   Drug use: Never     MEDICATIONS     Current Medication:  Current Facility-Administered Medications:    acetaminophen  (TYLENOL ) tablet 650 mg, 650 mg, Oral, Q6H PRN **OR** acetaminophen  (TYLENOL ) suppository 650 mg, 650 mg, Rectal, Q6H PRN, Fernand Prost, MD   atorvastatin  (LIPITOR) tablet 10 mg, 10 mg, Oral, Daily, Khan, Ghalib, MD, 10 mg at 07/06/24 2119   DULoxetine  (CYMBALTA ) DR capsule 30 mg, 30 mg, Oral, Daily, Khan, Ghalib, MD, 30 mg at 07/06/24 2119   enoxaparin  (LOVENOX ) injection 40 mg, 40 mg, Subcutaneous, Q24H, Fernand Prost, MD, 40 mg at 07/06/24 1657   gabapentin  (NEURONTIN ) capsule 100 mg, 100 mg, Oral, Tomorrow-1000, Fernand Prost, MD   gabapentin  (NEURONTIN ) capsule 400 mg, 400 mg, Oral, QHS, Fernand Prost, MD, 400 mg at 07/06/24 2119   ipratropium-albuterol  (DUONEB) 0.5-2.5 (3) MG/3ML nebulizer solution 3 mL, 3 mL, Nebulization, Q6H PRN, Fernand Prost, MD   senna-docusate (Senokot-S) tablet  1 tablet, 1 tablet, Oral, QHS PRN, Fernand Prost, MD   sodium chloride  flush (NS) 0.9 % injection 3 mL, 3 mL, Intravenous, Q12H, Fernand Prost, MD, 3 mL at 07/06/24 2123   sodium chloride  HYPERTONIC 3 % nebulizer solution 4 mL, 4 mL, Nebulization, Once, Fernand Prost, MD    ALLERGIES   Patient has no known allergies.     REVIEW OF SYSTEMS    Review of Systems:  Gen:  Denies  fever, sweats, chills weigh loss  HEENT: Denies blurred vision, double vision, ear pain, eye pain, hearing loss, nose bleeds, sore throat Cardiac:  No dizziness, chest pain or heaviness, chest tightness,edema Resp:   reports dyspnea chronically  Gi: Denies swallowing difficulty, stomach pain, nausea or vomiting, diarrhea, constipation, bowel incontinence Gu:   Denies bladder incontinence, burning urine Ext:   Denies Joint pain, stiffness or swelling Skin: Denies  skin rash, easy bruising or bleeding or hives Endoc:  Denies polyuria, polydipsia , polyphagia or weight change Psych:   Denies depression, insomnia or hallucinations   Other:  All other systems negative   VS: BP 130/75   Pulse 83   Temp 98.8 F (37.1 C)   Resp 17   Wt 84.5 kg   SpO2 92%   BMI 26.73 kg/m      PHYSICAL EXAM    GENERAL:NAD, no fevers, chills, no weakness no fatigue HEAD: Normocephalic, atraumatic.  EYES: Pupils equal, round, reactive to light. Extraocular muscles intact. No scleral icterus.  MOUTH: Moist mucosal membrane. Dentition intact. No abscess noted.  EAR, NOSE, THROAT: Clear without exudates. No external lesions.  NECK: Supple. No thyromegaly. No nodules. No JVD.  PULMONARY: decreased breath sounds with mild rhonchi worse at bases bilaterally.  CARDIOVASCULAR: S1 and S2. Regular rate and rhythm. No murmurs, rubs, or gallops. No edema. Pedal pulses 2+ bilaterally.  GASTROINTESTINAL: Soft, nontender, nondistended. No masses. Positive bowel sounds. No hepatosplenomegaly.  MUSCULOSKELETAL: No swelling, clubbing, or edema. Range of motion full in all extremities.  NEUROLOGIC: Cranial nerves II through XII are intact. No gross focal neurological deficits. Sensation intact. Reflexes intact.  SKIN: No ulceration, lesions, rashes, or cyanosis. Skin warm and dry. Turgor intact.  PSYCHIATRIC: Mood, affect within normal limits. The patient is awake, alert and oriented x 3. Insight, judgment intact.       IMAGING       Narrative & Impression EXAM: CT CHEST WITHOUT CONTRAST 06/30/2024 03:26:00 PM   TECHNIQUE: CT of the chest was performed without the administration of intravenous contrast. Multiplanar reformatted images are provided for review. Automated exposure control, iterative reconstruction, and/or weight based adjustment of the mA/kV was  utilized to reduce the radiation dose to as low as reasonably achievable.   COMPARISON: CT Chest 04/13/2024.   CLINICAL HISTORY: Respiratory illness, intermittent low grade fever.   FINDINGS:   MEDIASTINUM: Heart and pericardium are unremarkable. Mild coronary atherosclerosis of the LAD (left anterior descending) and right coronary artery. The central airways are clear.   LYMPH NODES: No mediastinal, hilar or axillary lymphadenopathy.   LUNGS AND PLEURA: Multifocal patchy/nodular opacities with surrounding ground glass in the lungs bilaterally, new from recent prior, favoring multifocal infection/pneumonia. Atypical/fungal etiologies are possible. No pleural effusion or pneumothorax.   SOFT TISSUES/BONES: No acute abnormality of the bones or soft tissues.   UPPER ABDOMEN: Limited images of the upper abdomen demonstrates no acute abnormality.   IMPRESSION: 1. Multifocal infection/pneumonia, new from recent prior. Atypical/fungal etiologies are possible. Follow-up CT chest is suggested in 3  months, after appropriate antimicrobial therapy, to assess for resolution/improvement.   Electronically signed by: Pinkie Pebbles MD 07/06/2024 02:55 AM EST RP Workstation: [id number]      ASSESSMENT/PLAN   Multifocal pneumonia        Patient is with relative immunosuppresion due to therapy for seronegative spondylitis with cossentyx and methotrexate .  The methotrexate  and cosentyx  has been discontinued however patient had recurrent pneumonia since then.   -he has had testing ordered including fungitell and galactomanan , cryptococcal ag, RVP, legionella, histoplasma, blood cultures, procalcitonin.      - I think it would be prudent to cover him broadly with vancomycin /zosyn due to high risk for HAP with recurrent visits in hospital setting.       - additional blood work ESR/CRP/MRSA/ MTB RIF NAA x3      -we can certainly narrow and refine therapy based on results      -currently  in no distress on ambient air normoxic.            Thank you for allowing me to participate in the care of this patient.   Patient/Family are satisfied with care plan and all questions have been answered.    Provider disclosure: Patient with at least one acute or chronic illness or injury that poses a threat to life or bodily function and is being managed actively during this encounter.  All of the below services have been performed independently by signing provider:  review of prior documentation from internal and or external health records.  Review of previous and current lab results.  Interview and comprehensive assessment during patient visit today. Review of current and previous chest radiographs/CT scans. Discussion of management and test interpretation with health care team and patient/family.   This document was prepared using Dragon voice recognition software and may include unintentional dictation errors.     Ronnica Dreese, M.D.  Division of Pulmonary & Critical Care Medicine

## 2024-07-08 ENCOUNTER — Inpatient Hospital Stay

## 2024-07-08 ENCOUNTER — Other Ambulatory Visit: Payer: Self-pay | Admitting: Student in an Organized Health Care Education/Training Program

## 2024-07-08 DIAGNOSIS — N1831 Chronic kidney disease, stage 3a: Secondary | ICD-10-CM | POA: Diagnosis not present

## 2024-07-08 DIAGNOSIS — J188 Other pneumonia, unspecified organism: Secondary | ICD-10-CM | POA: Diagnosis not present

## 2024-07-08 DIAGNOSIS — R911 Solitary pulmonary nodule: Secondary | ICD-10-CM | POA: Insufficient documentation

## 2024-07-08 DIAGNOSIS — I10 Essential (primary) hypertension: Secondary | ICD-10-CM | POA: Diagnosis not present

## 2024-07-08 HISTORY — DX: Solitary pulmonary nodule: R91.1

## 2024-07-08 LAB — CRYPTOCOCCUS ANTIGEN, SERUM: Cryptococcus Antigen, Serum: NEGATIVE

## 2024-07-08 LAB — CRYPTOCOCCAL ANTIGEN: Crypto Ag: NEGATIVE

## 2024-07-08 MED ORDER — CEFPODOXIME PROXETIL 200 MG PO TABS: 200.0000 mg | ORAL_TABLET | Freq: Two times a day (BID) | ORAL | 0 refills | Status: AC

## 2024-07-08 MED ORDER — SODIUM CHLORIDE 0.9 % IV SOLN
1.0000 g | INTRAVENOUS | Status: DC
  Filled 2024-07-08: qty 10

## 2024-07-08 MED ORDER — AZITHROMYCIN 500 MG PO TABS: 500.0000 mg | ORAL_TABLET | Freq: Every day | ORAL | 0 refills | Status: AC

## 2024-07-08 NOTE — Progress Notes (Signed)
 Patient was given verbal and written discharge instructions. He acknowledges understand and states will comply taking his medication. Patient was taken to car by wheelchair and no distress noted when leaving the floor.

## 2024-07-08 NOTE — Discharge Summary (Signed)
 Physician Discharge Summary   Patient: Benjamin Fields MRN: [id number] DOB: 1960-12-09  Admit date:     07/06/2024  Discharge date: 07/08/24  Discharge Physician: Murvin Mana   PCP: Wendee Lynwood HERO, NP   Recommendations at discharge:   Follow-up with PCP in 1 week. Follow-up with pulmonology on Tuesday for bronchoscopy.  Discharge Diagnoses: Principal Problem:   Multifocal pneumonia Active Problems:   DDD (degenerative disc disease), cervical   Essential hypertension   Rheumatoid arthritis of multiple sites with negative rheumatoid factor (HCC)   CKD stage 3a, GFR 45-59 ml/min (HCC)  Resolved Problems:   * No resolved hospital problems. *  Hospital Course: Benjamin Fields is a 63 y.o. year old male with medical history of HTN, HLD, MDD, GAD and RA presenting to the ED with malaise, fatigue and low grad fevers. He was treated for pneumonia in July with levaquin  and August with Augmentin .  He states that he had a high fever 101 about 3 days ago.  He still have a dry cough.  Chest x-ray showed multifocal pneumonia.  Pulmonology consult obtained. Patient is treated with abx.  Symptom improved   Assessment and Plan:  Multifocal pneumonia. Reviewed her prior CT scan a week ago, x-ray performed admission.  Patient has multifocal infiltrates, currently does not have fever for the last 48 hours. Etiology still unclear, fungal is in differential.  Appreciate pulmonology consult, currently recommended broad-spectrum antibiotic coverage with Zosyn and vancomycin while working out the cause. BNP not elevated, no concerning for congestive heart failure. Patient seen by pulmonology again today, most likely this is autoimmune process.  Will continue oral antibiotics for now, patient will be scheduled to come back to pulmonology office for bronchoscopy next Tuesday.   Rheumatoid arthritis. Patient is immunocompromised from prior treatment.   Chronic kidney disease stage IIIa. Renal  function still stable.   Essential hypertension. Resume home treatment  Overweight with BMI 27.65.       Consultants: Pulmonology Procedures performed: None  Disposition: Home Diet recommendation:  Discharge Diet Orders (From admission, onward)     Start     Ordered   07/08/24 0000  Diet - low sodium heart healthy        07/08/24 1553           Cardiac diet DISCHARGE MEDICATION: Allergies as of 07/08/2024   No Known Allergies      Medication List     STOP taking these medications    methocarbamol  500 MG tablet Commonly known as: ROBAXIN        TAKE these medications    albuterol  108 (90 Base) MCG/ACT inhaler Commonly known as: VENTOLIN  HFA Inhale 2 puffs into the lungs every 6 (six) hours as needed for wheezing or shortness of breath.   atorvastatin  10 MG tablet Commonly known as: LIPITOR Take 1 tablet (10 mg total) by mouth daily.   azithromycin  500 MG tablet Commonly known as: Zithromax  Take 1 tablet (500 mg total) by mouth daily for 3 days.   cefpodoxime 200 MG tablet Commonly known as: VANTIN Take 1 tablet (200 mg total) by mouth 2 (two) times daily for 7 days.   DULoxetine  30 MG capsule Commonly known as: CYMBALTA  Take 30 mg by mouth daily. What changed: Another medication with the same name was removed. Continue taking this medication, and follow the directions you see here.   gabapentin  100 MG capsule Commonly known as: NEURONTIN  TAKE 1 CAPSULE BY MOUTH IN THE MORNING AND 4 EVERY DAY  AT BEDTIME   losartan  25 MG tablet Commonly known as: COZAAR  Take 1 tablet (25 mg total) by mouth daily. What changed: how much to take   multivitamin tablet Take 1 tablet by mouth daily. OTC        Follow-up Information     Wendee Lynwood HERO, NP Follow up.   Specialties: Nurse Practitioner, Family Medicine Contact information: [address] [place] [address] [phone]         Isadora Hose, MD Follow up.   Specialty:  Pulmonary Disease Why: as scheduled on Tuesday for bronch Contact information: [address] [place] [address] [phone]                Discharge Exam: Filed Weights   07/06/24 0853 07/07/24 0500 07/08/24 0500  Weight: 85 kg 84.5 kg 87.4 kg   General exam: Appears calm and comfortable  Respiratory system: Clear to auscultation. Respiratory effort normal. Cardiovascular system: S1 & S2 heard, RRR. No JVD, murmurs, rubs, gallops or clicks. No pedal edema. Gastrointestinal system: Abdomen is nondistended, soft and nontender. No organomegaly or masses felt. Normal bowel sounds heard. Central nervous system: Alert and oriented. No focal neurological deficits. Extremities: Symmetric 5 x 5 power. Skin: No rashes, lesions or ulcers Psychiatry: Judgement and insight appear normal. Mood & affect appropriate.    Condition at discharge: good  The results of significant diagnostics from this hospitalization (including imaging, microbiology, ancillary and laboratory) are listed below for reference.   Imaging Studies: DG Chest 2 View Result Date: 07/08/2024 EXAM: 2 VIEW(S) XRAY OF THE CHEST 07/08/2024 08:12:00 AM COMPARISON: 07/06/2024. CLINICAL HISTORY: Pneumonia. FINDINGS: LUNGS AND PLEURA: Increased bilateral upper lobe opacities are noted, concerning for worsening multifocal pneumonia. No pleural effusion. No pneumothorax. HEART AND MEDIASTINUM: No acute abnormality of the cardiac and mediastinal silhouettes. BONES AND SOFT TISSUES: No acute osseous abnormality. IMPRESSION: 1. Worsening multifocal pneumonia with increased bilateral upper lobe opacities. Electronically signed by: Lynwood Seip MD 07/08/2024 09:02 AM EST RP Workstation: [id number]   DG Chest Port 1 View Result Date: 07/06/2024 CLINICAL DATA:  Weakness and fever. EXAM: PORTABLE CHEST 1 VIEW COMPARISON:  03/12/2024 FINDINGS: Multifocal airspace opacity bilaterally with nodular appearance in some areas.  Findings better characterized on recent chest CT of 06/30/2024. Low lung volumes. SIRT cardiomegaly Telemetry leads overlie the chest. IMPRESSION: Multifocal airspace opacity bilaterally with nodular appearance in some areas. Findings better characterized on recent chest CT of 06/30/2024 as likely infectious/inflammatory etiology. Electronically Signed   By: Camellia Candle M.D.   On: 07/06/2024 09:49   CT CHEST WO CONTRAST Result Date: 07/06/2024 EXAM: CT CHEST WITHOUT CONTRAST 06/30/2024 03:26:00 PM TECHNIQUE: CT of the chest was performed without the administration of intravenous contrast. Multiplanar reformatted images are provided for review. Automated exposure control, iterative reconstruction, and/or weight based adjustment of the mA/kV was utilized to reduce the radiation dose to as low as reasonably achievable. COMPARISON: CT Chest 04/13/2024. CLINICAL HISTORY: Respiratory illness, intermittent low grade fever. FINDINGS: MEDIASTINUM: Heart and pericardium are unremarkable. Mild coronary atherosclerosis of the LAD (left anterior descending) and right coronary artery. The central airways are clear. LYMPH NODES: No mediastinal, hilar or axillary lymphadenopathy. LUNGS AND PLEURA: Multifocal patchy/nodular opacities with surrounding ground glass in the lungs bilaterally, new from recent prior, favoring multifocal infection/pneumonia. Atypical/fungal etiologies are possible. No pleural effusion or pneumothorax. SOFT TISSUES/BONES: No acute abnormality of the bones or soft tissues. UPPER ABDOMEN: Limited images of the upper abdomen demonstrates no  acute abnormality. IMPRESSION: 1. Multifocal infection/pneumonia, new from recent prior. Atypical/fungal etiologies are possible. Follow-up CT chest is suggested in 3 months, after appropriate antimicrobial therapy, to assess for resolution/improvement. Electronically signed by: Pinkie Pebbles MD 07/06/2024 02:55 AM EST RP Workstation: [id number]     Microbiology: Results for orders placed or performed during the hospital encounter of 07/06/24  Culture, blood (routine x 2)     Status: None (Preliminary result)   Collection Time: 07/06/24  9:06 AM   Specimen: BLOOD  Result Value Ref Range Status   Specimen Description BLOOD LEFT ANTECUBITAL  Final   Special Requests   Final    BOTTLES DRAWN AEROBIC AND ANAEROBIC Blood Culture results may not be optimal due to an inadequate volume of blood received in culture bottles   Culture   Final    NO GROWTH 2 DAYS Performed at [facility], [address], [place], [place] [address]    Report Status PENDING  Incomplete  Culture, blood (routine x 2)     Status: None (Preliminary result)   Collection Time: 07/06/24  9:06 AM   Specimen: BLOOD  Result Value Ref Range Status   Specimen Description BLOOD BLOOD RIGHT ARM  Final   Special Requests   Final    BOTTLES DRAWN AEROBIC AND ANAEROBIC Blood Culture adequate volume   Culture   Final    NO GROWTH 2 DAYS Performed at [facility], [address] [place], [place] [address]    Report Status PENDING  Incomplete  Resp panel by RT-PCR (RSV, Flu A&B, Covid) Anterior Nasal Swab     Status: None   Collection Time: 07/06/24  1:34 PM   Specimen: Anterior Nasal Swab  Result Value Ref Range Status   SARS Coronavirus 2 by RT PCR NEGATIVE NEGATIVE Final    Comment: (NOTE) SARS-CoV-2 target nucleic acids are NOT DETECTED.  The SARS-CoV-2 RNA is generally detectable in upper respiratory specimens during the acute phase of infection. The lowest concentration of SARS-CoV-2 viral copies this assay can detect is 138 copies/mL. A negative result does not preclude SARS-Cov-2 infection and should not be used as the sole basis for treatment or other patient management decisions. A negative result may occur with  improper specimen collection/handling, submission of specimen other than nasopharyngeal swab, presence of viral  mutation(s) within the areas targeted by this assay, and inadequate number of viral copies(<138 copies/mL). A negative result must be combined with clinical observations, patient history, and epidemiological information. The expected result is Negative.  Fact Sheet for Patients:  bloggercourse.com  Fact Sheet for Healthcare Providers:  seriousbroker.it  This test is no t yet approved or cleared by the [place]  FDA and  has been authorized for detection and/or diagnosis of SARS-CoV-2 by FDA under an Emergency Use Authorization (EUA). This EUA will remain  in effect (meaning this test can be used) for the duration of the COVID-19 declaration under Section 564(b)(1) of the Act, 21 U.S.C.section 360bbb-3(b)(1), unless the authorization is terminated  or revoked sooner.       Influenza A by PCR NEGATIVE NEGATIVE Final   Influenza B by PCR NEGATIVE NEGATIVE Final    Comment: (NOTE) The Xpert Xpress SARS-CoV-2/FLU/RSV plus assay is intended as an aid in the diagnosis of influenza from Nasopharyngeal swab specimens and should not be used as a sole basis for treatment. Nasal washings and aspirates are unacceptable for Xpert Xpress SARS-CoV-2/FLU/RSV testing.  Fact Sheet for Patients: bloggercourse.com  Fact Sheet for Healthcare Providers:  seriousbroker.it  This test is not yet approved or cleared by the [place]  FDA and has been authorized for detection and/or diagnosis of SARS-CoV-2 by FDA under an Emergency Use Authorization (EUA). This EUA will remain in effect (meaning this test can be used) for the duration of the COVID-19 declaration under Section 564(b)(1) of the Act, 21 U.S.C. section 360bbb-3(b)(1), unless the authorization is terminated or revoked.     Resp Syncytial Virus by PCR NEGATIVE NEGATIVE Final    Comment: (NOTE) Fact Sheet for  Patients: bloggercourse.com  Fact Sheet for Healthcare Providers: seriousbroker.it  This test is not yet approved or cleared by the [place]  FDA and has been authorized for detection and/or diagnosis of SARS-CoV-2 by FDA under an Emergency Use Authorization (EUA). This EUA will remain in effect (meaning this test can be used) for the duration of the COVID-19 declaration under Section 564(b)(1) of the Act, 21 U.S.C. section 360bbb-3(b)(1), unless the authorization is terminated or revoked.  Performed at [facility], [address] [place], [place] [address]   Respiratory (~20 pathogens) panel by PCR     Status: None   Collection Time: 07/06/24  2:37 PM   Specimen: Nasopharyngeal Swab; Respiratory  Result Value Ref Range Status   Adenovirus NOT DETECTED NOT DETECTED Final   Coronavirus 229E NOT DETECTED NOT DETECTED Final    Comment: (NOTE) The Coronavirus on the Respiratory Panel, DOES NOT test for the novel  Coronavirus (2019 nCoV)    Coronavirus HKU1 NOT DETECTED NOT DETECTED Final   Coronavirus NL63 NOT DETECTED NOT DETECTED Final   Coronavirus OC43 NOT DETECTED NOT DETECTED Final   Metapneumovirus NOT DETECTED NOT DETECTED Final   Rhinovirus / Enterovirus NOT DETECTED NOT DETECTED Final   Influenza A NOT DETECTED NOT DETECTED Final   Influenza B NOT DETECTED NOT DETECTED Final   Parainfluenza Virus 1 NOT DETECTED NOT DETECTED Final   Parainfluenza Virus 2 NOT DETECTED NOT DETECTED Final   Parainfluenza Virus 3 NOT DETECTED NOT DETECTED Final   Parainfluenza Virus 4 NOT DETECTED NOT DETECTED Final   Respiratory Syncytial Virus NOT DETECTED NOT DETECTED Final   Bordetella pertussis NOT DETECTED NOT DETECTED Final   Bordetella Parapertussis NOT DETECTED NOT DETECTED Final   Chlamydophila pneumoniae NOT DETECTED NOT DETECTED Final   Mycoplasma pneumoniae NOT DETECTED NOT DETECTED Final    Comment: Performed at  [facility], [address] [place], [place] [address]  MRSA Next Gen by PCR, Nasal     Status: None   Collection Time: 07/07/24  3:34 PM   Specimen: Nasal Mucosa; Nasal Swab  Result Value Ref Range Status   MRSA by PCR Next Gen NOT DETECTED NOT DETECTED Final    Comment: (NOTE) The GeneXpert MRSA Assay (FDA approved for NASAL specimens only), is one component of a comprehensive MRSA colonization surveillance program. It is not intended to diagnose MRSA infection nor to guide or monitor treatment for MRSA infections. Test performance is not FDA approved in patients less than 25 years old. Performed at [facility], [address] [place], [place] [address]     Labs: CBC: Recent Labs  Lab 07/06/24 0906 07/07/24 0350  WBC 11.7* 10.6*  NEUTROABS 8.5*  --   HGB 14.2 12.1*  HCT 43.2 35.6*  MCV 92.1 90.4  PLT 380 326   Basic Metabolic Panel: Recent Labs  Lab 07/05/24 0810 07/06/24 0906 07/06/24 1059 07/07/24 0350  NA 139 142  --  139  K  4.9 4.8  --  4.3  CL 101 103  --  104  CO2 27 28  --  25  GLUCOSE 116* 121*  --  114*  BUN 21 23  --  21  CREATININE 1.64* 1.58*  --  1.46*  CALCIUM  9.2 10.0  --  8.7*  MG  --   --  2.4  --    Liver Function Tests: Recent Labs  Lab 07/05/24 0810 07/06/24 0906  AST 10 15  ALT 14 17  ALKPHOS 65 79  BILITOT 1.1 0.8  PROT 5.8* 6.7  ALBUMIN 3.7 3.8   CBG: Recent Labs  Lab 07/07/24 0732  GLUCAP 116*    Discharge time spent: 35 minutes.  Signed: Murvin Mana, MD [facility] 07/08/2024

## 2024-07-08 NOTE — Consult Note (Signed)
 NAME:  Benjamin Fields, MRN:  [id number], DOB:  [birth year], LOS: 1 ADMISSION DATE:  07/06/2024, CONSULTATION DATE:  07/08/2024 REFERRING MD:  Murvin Mana, CHIEF COMPLAINT:  Pulmonary Infiltrates   History of Present Illness:   The patient is a 63 year old with psoriatic arthritis who presents with multifocal lung infiltrates.  He has a history of auto-immune disease, initially diagnosed as seronegative rheumatoid arthritis in 2019 at [facility]. He was treated with multiple different agents for immune suppression (prednisone , methotrexate , adalimumab , tocilizumab , upadacitinib ). He then obtained a second opinion at [facility] where he was diagnosed with Psoriatic Arthritis, and treated with Methotrexate  and Secukinumab . He has received intermittent immune suppression and reports that he has not been on immune suppression since March 2023.  In July 2025, he presented with fevers, shortness of breath, and hypoxia. A CT scan revealed left lower lobe pneumonia with other areas of consolidation. A follow-up CT scan in August 2025 was performed showing multilobar pneumonia with waxing and waning areas of consolidation. A repeat CT scan in November 2025 was performed and showed multifocal consolidations and infiltrates.  He experiences low-grade fevers at night and denies any increase in shortness of breath, cough, or sputum production. He was seeing his cardiologist and asked to present to urgent care and the ED to obtain further care. He was seen in the ED 11/11 and admitted to [facility] for further care. He was started on IV antibiotics with Meropenem and Vancomycin. He was then transitioned to Zosyn and Azithromycin . Workup included normal lactic acid, low procalcitonin, elevated CRP, and ESR. Viral panels and tests for Histoplasma, Legionella, and MRSA were negative.  Pertinent  Medical History  Posriatic Arthritis  Objective    Blood pressure 132/73, pulse 94, temperature 98.2 F (36.8  C), temperature source Oral, resp. rate 16, weight 87.4 kg, SpO2 93%.        Intake/Output Summary (Last 24 hours) at 07/08/2024 1531 Last data filed at 07/08/2024 1034 Gross per 24 hour  Intake 395.27 ml  Output --  Net 395.27 ml   Filed Weights   07/06/24 0853 07/07/24 0500 07/08/24 0500  Weight: 85 kg 84.5 kg 87.4 kg    Examination: Physical Exam Constitutional:      Appearance: Normal appearance.  Cardiovascular:     Rate and Rhythm: Normal rate and regular rhythm.     Pulses: Normal pulses.     Heart sounds: Normal heart sounds.  Pulmonary:     Effort: Pulmonary effort is normal.     Breath sounds: Normal breath sounds.  Neurological:     General: No focal deficit present.     Mental Status: He is alert and oriented to person, place, and time. Mental status is at baseline.     Assessment and Plan   #Multifocal pulmonary infiltrates likely related to underlying autoimmune disease #Psoriatic Arthritis  Recent CT scan shows multifocal pulmonary infiltrates with consolidations. Physical exam is reassuring, and he remains on room air.   Patient presenting with waxing and waning pulmonary infiltrates over the past month with no infectious or respiratory symptoms. Differential diagnosis includes granulomatous inflammation, organizing pneumonia, or other idiopathic interstitial pneumonia in relation to auto-immune disease. Opportunistic infections are possible, but less likely, especially given lack of immune suppression since 2023.  Infectious workup has included blood and respiratory cultures, beta-d-glucan, and procalcitonin. I will add cryptococcal antigen and aspergillus antigens to his workup. I would also recommend proceeding with bronchoscopy to establish a diagnosis. While BAL will  help rule out infectious processes, it would not allow us  to obtain tissue for diagnosis. Would recommend proceeding with robotic assisted navigational bronchoscopy to the target nodules,  and obtain tissue for both cytology, pathology, and culture to establish the process behind his infiltrates.  We discussed the importance of diagnosis and staging in lung malignancies, and the approach to obtaining a tissue diagnosis which would include robotic assisted navigational bronchoscopy with endobronchial ultrasound guided sampling.  We also discussed the risks associated with the procedure which include a 2% risk of pneumothorax, infection, bleeding, and nondiagnostic procedure in detail.  I explained that patients typically are able to return home the same day of the procedure, but in rare cases admission to the hospital for observation and treatment is required.  After our discussion, the patient elected to proceed with the procedure  Recommendations: Robotic Assisted Navigational Bronchoscopy on Tuesday Will need SuperD CT prior to proceed Our team to arrange for navigational bronchoscopy Aspergillus and Cryptococcal antigens sent, follow up beta-d-glucan (could be falsely elevated with zosyn) Ok to d/c vancomycin, switch to PO antibiotics for CAP coverage (could do PO cefpodoxime and PO Azithromycin )  I discussed this plan with the attending of record Dr. Laurita as well as the patient's primary pulmonologist Dr. Isaiah.  Belva November, MD [facility] 07/08/2024 3:43 PM    Labs   CBC: Recent Labs  Lab 07/06/24 0906 07/07/24 0350  WBC 11.7* 10.6*  NEUTROABS 8.5*  --   HGB 14.2 12.1*  HCT 43.2 35.6*  MCV 92.1 90.4  PLT 380 326    Basic Metabolic Panel: Recent Labs  Lab 07/05/24 0810 07/06/24 0906 07/06/24 1059 07/07/24 0350  NA 139 142  --  139  K 4.9 4.8  --  4.3  CL 101 103  --  104  CO2 27 28  --  25  GLUCOSE 116* 121*  --  114*  BUN 21 23  --  21  CREATININE 1.64* 1.58*  --  1.46*  CALCIUM  9.2 10.0  --  8.7*  MG  --   --  2.4  --    GFR: Estimated Creatinine Clearance: 52.1 mL/min (A) (by C-G formula based on SCr of 1.46 mg/dL  (H)). Recent Labs  Lab 07/06/24 0906 07/06/24 1059 07/06/24 1746 07/07/24 0350  PROCALCITON  --   --  <0.10  --   WBC 11.7*  --   --  10.6*  LATICACIDVEN 1.1 0.9  --   --     Liver Function Tests: Recent Labs  Lab 07/05/24 0810 07/06/24 0906  AST 10 15  ALT 14 17  ALKPHOS 65 79  BILITOT 1.1 0.8  PROT 5.8* 6.7  ALBUMIN 3.7 3.8   No results for input(s): LIPASE, AMYLASE in the last 168 hours. No results for input(s): AMMONIA in the last 168 hours.  ABG No results found for: PHART, PCO2ART, PO2ART, HCO3, TCO2, ACIDBASEDEF, O2SAT   Coagulation Profile: No results for input(s): INR, PROTIME in the last 168 hours.  Cardiac Enzymes: No results for input(s): CKTOTAL, CKMB, CKMBINDEX, TROPONINI in the last 168 hours.  HbA1C: Hgb A1c MFr Bld  Date/Time Value Ref Range Status  07/05/2024 08:10 AM 6.3 4.6 - 6.5 % Final    Comment:    Glycemic Control Guidelines for People with Diabetes:Non Diabetic:  <6%Goal of Therapy: <7%Additional Action Suggested:  >8%   01/14/2024 03:38 PM 5.8 4.6 - 6.5 % Final    Comment:    Glycemic Control Guidelines  for People with Diabetes:Non Diabetic:  <6%Goal of Therapy: <7%Additional Action Suggested:  >8%     CBG: Recent Labs  Lab 07/07/24 0732  GLUCAP 116*    Review of Systems:   Review of Systems  Constitutional:  Negative for chills and fever.  Respiratory:  Negative for cough, hemoptysis, sputum production, shortness of breath and wheezing.   Cardiovascular:  Negative for chest pain.     Past Medical History:  He,  has a past medical history of Allergy , Anxiety (03/09/18), Depression (03/09/2018), Hypertension (01/08/16), Neuromuscular disorder (HCC) (05/30/2018), and RA (rheumatoid arthritis) (HCC).   Surgical History:   Past Surgical History:  Procedure Laterality Date   CERVICAL SPINE SURGERY  2010   COLONOSCOPY     COLONOSCOPY  04/2020   IR INJECT/THERA/INC NEEDLE/CATH/PLC EPI/LUMB/SAC  W/IMG  05/05/2024   ROTATOR CUFF REPAIR Right    SPINE SURGERY  12/2004   TONSILLECTOMY       Social History:   reports that he has never smoked. He has never used smokeless tobacco. He reports current alcohol use. He reports that he does not use drugs.   Family History:  His family history includes Arthritis in his mother; Atrial fibrillation in his mother; COPD in his father; Depression in his mother; Heart disease in his father; Heart failure in his father; Hypertension in his father; Parkinson's disease in his mother; Prostate cancer (age of onset: 47) in his father. There is no history of Colon cancer, Colon polyps, Esophageal cancer, Rectal cancer, or Stomach cancer.   Allergies No Known Allergies   Home Medications  Prior to Admission medications   Medication Sig Start Date End Date Taking? Authorizing Provider  albuterol  (VENTOLIN  HFA) 108 (90 Base) MCG/ACT inhaler Inhale 2 puffs into the lungs every 6 (six) hours as needed for wheezing or shortness of breath. 04/13/24  Yes Kasa, Kurian, MD  atorvastatin  (LIPITOR) 10 MG tablet Take 1 tablet (10 mg total) by mouth daily. 04/02/24  Yes Wendee Lynwood HERO, NP  DULoxetine  (CYMBALTA ) 30 MG capsule Take 30 mg by mouth daily.   Yes [provider]  gabapentin  (NEURONTIN ) 100 MG capsule TAKE 1 CAPSULE BY MOUTH IN THE MORNING AND 4 EVERY DAY AT BEDTIME 05/31/24  Yes Wendee Lynwood HERO, NP  losartan  (COZAAR ) 25 MG tablet Take 1 tablet (25 mg total) by mouth daily. Patient taking differently: Take 50 mg by mouth daily. 08/08/23  Yes Wendee Lynwood HERO, NP  Multiple Vitamin (MULTIVITAMIN) tablet Take 1 tablet by mouth daily. OTC   Yes [provider]  DULoxetine  (CYMBALTA ) 60 MG capsule Take 1 capsule (60 mg total) by mouth daily. Patient not taking: Reported on 07/06/2024 04/05/24   Wendee Lynwood HERO, NP  methocarbamol  (ROBAXIN ) 500 MG tablet Take 1 tablet (500 mg total) by mouth every 8 (eight) hours as needed for muscle spasms. Can cause  drowsiness. Patient not taking: Reported on 07/06/2024 04/29/24   Hilma Hastings, PA-C      I spent 60 minutes caring for this patient today, including preparing to see the patient, obtaining a medical history , reviewing a separately obtained history, performing a medically appropriate examination and/or evaluation, counseling and educating the patient/family/caregiver, ordering medications, tests, or procedures, documenting clinical information in the electronic health record, and independently interpreting results (not separately reported/billed) and communicating results to the patient/family/caregiver

## 2024-07-08 NOTE — H&P (View-Only) (Signed)
 NAME:  Benjamin Fields, MRN:  [id number], DOB:  [birth year], LOS: 1 ADMISSION DATE:  07/06/2024, CONSULTATION DATE:  07/08/2024 REFERRING MD:  Murvin Mana, CHIEF COMPLAINT:  Pulmonary Infiltrates   History of Present Illness:   The patient is a 63 year old with psoriatic arthritis who presents with multifocal lung infiltrates.  He has a history of auto-immune disease, initially diagnosed as seronegative rheumatoid arthritis in 2019 at [facility]. He was treated with multiple different agents for immune suppression (prednisone , methotrexate , adalimumab , tocilizumab , upadacitinib ). He then obtained a second opinion at [facility] where he was diagnosed with Psoriatic Arthritis, and treated with Methotrexate  and Secukinumab . He has received intermittent immune suppression and reports that he has not been on immune suppression since March 2023.  In July 2025, he presented with fevers, shortness of breath, and hypoxia. A CT scan revealed left lower lobe pneumonia with other areas of consolidation. A follow-up CT scan in August 2025 was performed showing multilobar pneumonia with waxing and waning areas of consolidation. A repeat CT scan in November 2025 was performed and showed multifocal consolidations and infiltrates.  He experiences low-grade fevers at night and denies any increase in shortness of breath, cough, or sputum production. He was seeing his cardiologist and asked to present to urgent care and the ED to obtain further care. He was seen in the ED 11/11 and admitted to [facility] for further care. He was started on IV antibiotics with Meropenem and Vancomycin. He was then transitioned to Zosyn and Azithromycin . Workup included normal lactic acid, low procalcitonin, elevated CRP, and ESR. Viral panels and tests for Histoplasma, Legionella, and MRSA were negative.  Pertinent  Medical History  Posriatic Arthritis  Objective    Blood pressure 132/73, pulse 94, temperature 98.2 F (36.8  C), temperature source Oral, resp. rate 16, weight 87.4 kg, SpO2 93%.        Intake/Output Summary (Last 24 hours) at 07/08/2024 1531 Last data filed at 07/08/2024 1034 Gross per 24 hour  Intake 395.27 ml  Output --  Net 395.27 ml   Filed Weights   07/06/24 0853 07/07/24 0500 07/08/24 0500  Weight: 85 kg 84.5 kg 87.4 kg    Examination: Physical Exam Constitutional:      Appearance: Normal appearance.  Cardiovascular:     Rate and Rhythm: Normal rate and regular rhythm.     Pulses: Normal pulses.     Heart sounds: Normal heart sounds.  Pulmonary:     Effort: Pulmonary effort is normal.     Breath sounds: Normal breath sounds.  Neurological:     General: No focal deficit present.     Mental Status: He is alert and oriented to person, place, and time. Mental status is at baseline.     Assessment and Plan   #Multifocal pulmonary infiltrates likely related to underlying autoimmune disease #Psoriatic Arthritis  Recent CT scan shows multifocal pulmonary infiltrates with consolidations. Physical exam is reassuring, and he remains on room air.   Patient presenting with waxing and waning pulmonary infiltrates over the past month with no infectious or respiratory symptoms. Differential diagnosis includes granulomatous inflammation, organizing pneumonia, or other idiopathic interstitial pneumonia in relation to auto-immune disease. Opportunistic infections are possible, but less likely, especially given lack of immune suppression since 2023.  Infectious workup has included blood and respiratory cultures, beta-d-glucan, and procalcitonin. I will add cryptococcal antigen and aspergillus antigens to his workup. I would also recommend proceeding with bronchoscopy to establish a diagnosis. While BAL will  help rule out infectious processes, it would not allow us  to obtain tissue for diagnosis. Would recommend proceeding with robotic assisted navigational bronchoscopy to the target nodules,  and obtain tissue for both cytology, pathology, and culture to establish the process behind his infiltrates.  We discussed the importance of diagnosis and staging in lung malignancies, and the approach to obtaining a tissue diagnosis which would include robotic assisted navigational bronchoscopy with endobronchial ultrasound guided sampling.  We also discussed the risks associated with the procedure which include a 2% risk of pneumothorax, infection, bleeding, and nondiagnostic procedure in detail.  I explained that patients typically are able to return home the same day of the procedure, but in rare cases admission to the hospital for observation and treatment is required.  After our discussion, the patient elected to proceed with the procedure  Recommendations: Robotic Assisted Navigational Bronchoscopy on Tuesday Will need SuperD CT prior to proceed Our team to arrange for navigational bronchoscopy Aspergillus and Cryptococcal antigens sent, follow up beta-d-glucan (could be falsely elevated with zosyn) Ok to d/c vancomycin, switch to PO antibiotics for CAP coverage (could do PO cefpodoxime and PO Azithromycin )  I discussed this plan with the attending of record Dr. Laurita as well as the patient's primary pulmonologist Dr. Isaiah.  Belva November, MD [facility] 07/08/2024 3:43 PM    Labs   CBC: Recent Labs  Lab 07/06/24 0906 07/07/24 0350  WBC 11.7* 10.6*  NEUTROABS 8.5*  --   HGB 14.2 12.1*  HCT 43.2 35.6*  MCV 92.1 90.4  PLT 380 326    Basic Metabolic Panel: Recent Labs  Lab 07/05/24 0810 07/06/24 0906 07/06/24 1059 07/07/24 0350  NA 139 142  --  139  K 4.9 4.8  --  4.3  CL 101 103  --  104  CO2 27 28  --  25  GLUCOSE 116* 121*  --  114*  BUN 21 23  --  21  CREATININE 1.64* 1.58*  --  1.46*  CALCIUM  9.2 10.0  --  8.7*  MG  --   --  2.4  --    GFR: Estimated Creatinine Clearance: 52.1 mL/min (A) (by C-G formula based on SCr of 1.46 mg/dL  (H)). Recent Labs  Lab 07/06/24 0906 07/06/24 1059 07/06/24 1746 07/07/24 0350  PROCALCITON  --   --  <0.10  --   WBC 11.7*  --   --  10.6*  LATICACIDVEN 1.1 0.9  --   --     Liver Function Tests: Recent Labs  Lab 07/05/24 0810 07/06/24 0906  AST 10 15  ALT 14 17  ALKPHOS 65 79  BILITOT 1.1 0.8  PROT 5.8* 6.7  ALBUMIN 3.7 3.8   No results for input(s): LIPASE, AMYLASE in the last 168 hours. No results for input(s): AMMONIA in the last 168 hours.  ABG No results found for: PHART, PCO2ART, PO2ART, HCO3, TCO2, ACIDBASEDEF, O2SAT   Coagulation Profile: No results for input(s): INR, PROTIME in the last 168 hours.  Cardiac Enzymes: No results for input(s): CKTOTAL, CKMB, CKMBINDEX, TROPONINI in the last 168 hours.  HbA1C: Hgb A1c MFr Bld  Date/Time Value Ref Range Status  07/05/2024 08:10 AM 6.3 4.6 - 6.5 % Final    Comment:    Glycemic Control Guidelines for People with Diabetes:Non Diabetic:  <6%Goal of Therapy: <7%Additional Action Suggested:  >8%   01/14/2024 03:38 PM 5.8 4.6 - 6.5 % Final    Comment:    Glycemic Control Guidelines  for People with Diabetes:Non Diabetic:  <6%Goal of Therapy: <7%Additional Action Suggested:  >8%     CBG: Recent Labs  Lab 07/07/24 0732  GLUCAP 116*    Review of Systems:   Review of Systems  Constitutional:  Negative for chills and fever.  Respiratory:  Negative for cough, hemoptysis, sputum production, shortness of breath and wheezing.   Cardiovascular:  Negative for chest pain.     Past Medical History:  He,  has a past medical history of Allergy , Anxiety (03/09/18), Depression (03/09/2018), Hypertension (01/08/16), Neuromuscular disorder (HCC) (05/30/2018), and RA (rheumatoid arthritis) (HCC).   Surgical History:   Past Surgical History:  Procedure Laterality Date   CERVICAL SPINE SURGERY  2010   COLONOSCOPY     COLONOSCOPY  04/2020   IR INJECT/THERA/INC NEEDLE/CATH/PLC EPI/LUMB/SAC  W/IMG  05/05/2024   ROTATOR CUFF REPAIR Right    SPINE SURGERY  12/2004   TONSILLECTOMY       Social History:   reports that he has never smoked. He has never used smokeless tobacco. He reports current alcohol use. He reports that he does not use drugs.   Family History:  His family history includes Arthritis in his mother; Atrial fibrillation in his mother; COPD in his father; Depression in his mother; Heart disease in his father; Heart failure in his father; Hypertension in his father; Parkinson's disease in his mother; Prostate cancer (age of onset: 47) in his father. There is no history of Colon cancer, Colon polyps, Esophageal cancer, Rectal cancer, or Stomach cancer.   Allergies No Known Allergies   Home Medications  Prior to Admission medications   Medication Sig Start Date End Date Taking? Authorizing Provider  albuterol  (VENTOLIN  HFA) 108 (90 Base) MCG/ACT inhaler Inhale 2 puffs into the lungs every 6 (six) hours as needed for wheezing or shortness of breath. 04/13/24  Yes Kasa, Kurian, MD  atorvastatin  (LIPITOR) 10 MG tablet Take 1 tablet (10 mg total) by mouth daily. 04/02/24  Yes Wendee Lynwood HERO, NP  DULoxetine  (CYMBALTA ) 30 MG capsule Take 30 mg by mouth daily.   Yes [provider]  gabapentin  (NEURONTIN ) 100 MG capsule TAKE 1 CAPSULE BY MOUTH IN THE MORNING AND 4 EVERY DAY AT BEDTIME 05/31/24  Yes Wendee Lynwood HERO, NP  losartan  (COZAAR ) 25 MG tablet Take 1 tablet (25 mg total) by mouth daily. Patient taking differently: Take 50 mg by mouth daily. 08/08/23  Yes Wendee Lynwood HERO, NP  Multiple Vitamin (MULTIVITAMIN) tablet Take 1 tablet by mouth daily. OTC   Yes [provider]  DULoxetine  (CYMBALTA ) 60 MG capsule Take 1 capsule (60 mg total) by mouth daily. Patient not taking: Reported on 07/06/2024 04/05/24   Wendee Lynwood HERO, NP  methocarbamol  (ROBAXIN ) 500 MG tablet Take 1 tablet (500 mg total) by mouth every 8 (eight) hours as needed for muscle spasms. Can cause  drowsiness. Patient not taking: Reported on 07/06/2024 04/29/24   Hilma Hastings, PA-C      I spent 60 minutes caring for this patient today, including preparing to see the patient, obtaining a medical history , reviewing a separately obtained history, performing a medically appropriate examination and/or evaluation, counseling and educating the patient/family/caregiver, ordering medications, tests, or procedures, documenting clinical information in the electronic health record, and independently interpreting results (not separately reported/billed) and communicating results to the patient/family/caregiver

## 2024-07-09 ENCOUNTER — Telehealth: Payer: Self-pay

## 2024-07-09 NOTE — Telephone Encounter (Signed)
NA. LMTCB 

## 2024-07-09 NOTE — Transitions of Care (Post Inpatient/ED Visit) (Signed)
   07/09/2024  Name: Benjamin Fields MRN: [id number] DOB: [birth year]  Today's TOC FU Call Status: Today's TOC FU Call Status:: Unsuccessful Call (1st Attempt) Unsuccessful Call (1st Attempt) Date: 07/09/24  Attempted to reach the patient regarding the most recent Inpatient/ED visit.  Follow Up Plan: Additional outreach attempts will be made to reach the patient to complete the Transitions of Care (Post Inpatient/ED visit) call.   Signature Julian Lemmings, LPN [facility] Nurse Health Advisor Direct Dial [phone]

## 2024-07-09 NOTE — Telephone Encounter (Signed)
 Robotic Bronch with [facility] 07/13/2024 at 1:45pm Lung Nodule [id number], [id number], [id number], [id number], [id number], [id number], [id number]  Benjamin Fields please see Bronch info.   Bronch email has been sent.

## 2024-07-09 NOTE — Telephone Encounter (Signed)
 Lm x1 for the patient.

## 2024-07-09 NOTE — Progress Notes (Signed)
 Bronch has been scheduled. See telephone encounter from 11/14.  Nothing further needed.

## 2024-07-11 LAB — CULTURE, BLOOD (ROUTINE X 2)
Culture: NO GROWTH
Culture: NO GROWTH
Special Requests: ADEQUATE

## 2024-07-12 ENCOUNTER — Other Ambulatory Visit: Payer: Self-pay

## 2024-07-12 ENCOUNTER — Ambulatory Visit
Admission: RE | Admit: 2024-07-12 | Discharge: 2024-07-12 | Disposition: A | Discharge status: Home / Self Care | Source: Ambulatory Visit | Attending: Student in an Organized Health Care Education/Training Program | Admitting: Student in an Organized Health Care Education/Training Program

## 2024-07-12 DIAGNOSIS — R911 Solitary pulmonary nodule: Secondary | ICD-10-CM | POA: Insufficient documentation

## 2024-07-12 LAB — FUNGITELL BETA-D-GLUCAN: Fungitell Value:: <31.25 pg/mL

## 2024-07-12 MED ORDER — LACTATED RINGERS IV SOLN: INTRAVENOUS | Status: DC

## 2024-07-12 MED ORDER — CHLORHEXIDINE GLUCONATE 0.12 % MT SOLN
15.0000 mL | Freq: Once | OROMUCOSAL | Status: AC
  Administered 2024-07-13: 15 mL via OROMUCOSAL

## 2024-07-12 MED ORDER — ORAL CARE MOUTH RINSE: 15.0000 mL | Freq: Once | OROMUCOSAL | Status: AC

## 2024-07-12 NOTE — Transitions of Care (Post Inpatient/ED Visit) (Signed)
   07/12/2024  Name: YASSINE BRUNSMAN MRN: [id number] DOB: 1961/03/31  Today's TOC FU Call Status: Today's TOC FU Call Status:: Unsuccessful Call (2nd Attempt) Unsuccessful Call (1st Attempt) Date: 07/09/24 Unsuccessful Call (2nd Attempt) Date: 07/12/24  Attempted to reach the patient regarding the most recent Inpatient/ED visit.  Follow Up Plan: Additional outreach attempts will be made to reach the patient to complete the Transitions of Care (Post Inpatient/ED visit) call.   Signature Julian Lemmings, LPN [facility] Nurse Health Advisor Direct Dial [phone]

## 2024-07-12 NOTE — Patient Instructions (Addendum)
 Your procedure is scheduled on:   TUESDAY  NOVEMBER 19  Report to the Registration Desk on the 1st floor of the [facility]. To find out your arrival time, please call [phone] between 1PM - 3PM on:   MONDAY NOVEMBER 18  If your arrival time is 6:00 am, do not arrive before that time as the [facility] entrance doors do not open until 6:00 am. YOUR ARRIVAL TIME IS 1:00 ( or what the office said)    REMEMBER: Instructions that are not followed completely may result in serious medical risk, up to and including death; or upon the discretion of your surgeon and anesthesiologist your surgery may need to be rescheduled.  Do not eat food after midnight the night before surgery.  No gum chewing or hard candies.  You may however, drink CLEAR liquids up to 2 hours before you are scheduled to arrive for your surgery. Do not drink anything within 2 hours of your scheduled arrival time.  Clear liquids include: - water  - apple juice without pulp - gatorade (not RED colors) - black coffee or tea (Do NOT add milk or creamers to the coffee or tea) Do NOT drink anything that is not on this list.   One week prior to surgery: Stop Anti-inflammatories (NSAIDS) such as Advil, Aleve , Ibuprofen, Motrin, Naproxen , Naprosyn  and Aspirin based products such as Excedrin, Goody's Powder, BC Powder. Stop ANY OVER THE COUNTER supplements until after surgery. Multiple Vitamin (MULTIVITAMIN)   You may however, continue to take Tylenol  if needed for pain up until the day of surgery.  Continue taking all of your other prescription medications up until the day of surgery.  ON THE DAY OF SURGERY ONLY TAKE THESE MEDICATIONS WITH SIPS OF WATER:  cefpodoxime (VANTIN)  azithromycin  (ZITHROMAX )  DULoxetine  (CYMBALTA )   Use inhalers on the day of surgery and bring to the hospital. albuterol  (VENTOLIN  HFA)   No Alcohol for 24 hours before or after surgery.  Do not use any recreational drugs for at least a  week (preferably 2 weeks) before your surgery.  Please be advised that the combination of cocaine and anesthesia may have negative outcomes, up to and including death. If you test positive for cocaine, your surgery will be cancelled.  On the morning of surgery brush your teeth with toothpaste and water, you may rinse your mouth with mouthwash if you wish. Do not swallow any toothpaste or mouthwash.  Do not wear jewelry, make-up, hairpins, clips or nail polish.  For welded (permanent) jewelry: bracelets, anklets, waist bands, etc.  Please have this removed prior to surgery.  If it is not removed, there is a chance that hospital personnel will need to cut it off on the day of surgery.  Do not wear lotions, powders, or perfumes.   Do not shave body hair from the neck down 48 hours before surgery.  Contact lenses, hearing aids and dentures may not be worn into surgery.  Do not bring valuables to the hospital. [facility] is not responsible for any missing/lost belongings or valuables.   Notify your doctor if there is any change in your medical condition (cold, fever, infection).  Wear comfortable clothing (specific to your surgery type) to the hospital.  After surgery, you can help prevent lung complications by doing breathing exercises.  Take deep breaths and cough every 1-2 hours.   If you are being discharged the day of surgery, you will not be allowed to drive home. You will need a  responsible individual to drive you home and stay with you for 24 hours after surgery.   If you are taking public transportation, you will need to have a responsible individual with you.  Please call the Pre-admissions Testing Dept. at [phone] if you have any questions about these instructions.  Surgery Visitation Policy:  Patients having surgery or a procedure may have two visitors.  Children under the age of 8 must have an adult with them who is not the patient.  Merchandiser, Retail  to address health-related social needs:  [email or web address]

## 2024-07-12 NOTE — Transitions of Care (Post Inpatient/ED Visit) (Signed)
   07/12/2024  Name: Benjamin Fields MRN: [id number] DOB: [birth year]  Today's TOC FU Call Status: Today's TOC FU Call Status:: Successful TOC FU Call Completed Unsuccessful Call (1st Attempt) Date: 07/09/24 Unsuccessful Call (2nd Attempt) Date: 07/12/24 [facility] FU Call Complete Date: 07/12/24  Patient's Name and Date of Birth confirmed. Name, DOB  Transition Care Management Follow-up Telephone Call Date of Discharge: 07/08/24 Discharge Facility: [facility]) Type of Discharge: Inpatient Admission How have you been since you were released from the hospital?: Better Any questions or concerns?: No  Items Reviewed: Did you receive and understand the discharge instructions provided?: Yes Medications obtained,verified, and reconciled?: Yes (Medications Reviewed) Any new allergies since your discharge?: No Dietary orders reviewed?: Yes Do you have support at home?: Yes People in Home [RPT]: spouse  Medications Reviewed Today: Medications Reviewed Today   Medications were not reviewed in this encounter     Home Care and Equipment/Supplies: Were Home Health Services Ordered?: NA Any new equipment or medical supplies ordered?: NA  Functional Questionnaire: Do you need assistance with bathing/showering or dressing?: No Do you need assistance with meal preparation?: No Do you need assistance with eating?: No Do you have difficulty maintaining continence: No Do you need assistance with getting out of bed/getting out of a chair/moving?: No Do you have difficulty managing or taking your medications?: No  Follow up appointments reviewed: PCP Follow-up appointment confirmed?: Yes Date of PCP follow-up appointment?: 07/26/24 Follow-up Provider: [facility] Follow-up appointment confirmed?: No Reason Specialist Follow-Up Not Confirmed: Patient has Specialist Provider Number and will Call for Appointment Do you need transportation to your follow-up  appointment?: No Do you understand care options if your condition(s) worsen?: Yes-patient verbalized understanding    SIGNATURE Julian Lemmings, LPN [facility] Nurse Health Advisor Direct Dial [phone]

## 2024-07-12 NOTE — Telephone Encounter (Signed)
 Noted. Nothing further needed.

## 2024-07-12 NOTE — Telephone Encounter (Signed)
 Patient has Medicare A & B and [employer] Supplement plan Prior Auth not required

## 2024-07-12 NOTE — Telephone Encounter (Signed)
 Noted

## 2024-07-13 ENCOUNTER — Ambulatory Visit

## 2024-07-13 ENCOUNTER — Encounter
Admission: RE | Disposition: A | Discharge status: Home / Self Care | Payer: Self-pay | Source: Home / Self Care | Attending: Student in an Organized Health Care Education/Training Program

## 2024-07-13 ENCOUNTER — Other Ambulatory Visit: Payer: Self-pay

## 2024-07-13 ENCOUNTER — Ambulatory Visit
Admission: RE | Admit: 2024-07-13 | Discharge: 2024-07-13 | Disposition: A | Discharge status: Home / Self Care | Attending: Student in an Organized Health Care Education/Training Program | Admitting: Student in an Organized Health Care Education/Training Program

## 2024-07-13 ENCOUNTER — Encounter: Payer: Self-pay | Admitting: Student in an Organized Health Care Education/Training Program

## 2024-07-13 DIAGNOSIS — I2510 Atherosclerotic heart disease of native coronary artery without angina pectoris: Secondary | ICD-10-CM | POA: Insufficient documentation

## 2024-07-13 DIAGNOSIS — Z79899 Other long term (current) drug therapy: Secondary | ICD-10-CM | POA: Diagnosis not present

## 2024-07-13 DIAGNOSIS — R918 Other nonspecific abnormal finding of lung field: Secondary | ICD-10-CM | POA: Insufficient documentation

## 2024-07-13 DIAGNOSIS — M069 Rheumatoid arthritis, unspecified: Secondary | ICD-10-CM | POA: Diagnosis not present

## 2024-07-13 DIAGNOSIS — F419 Anxiety disorder, unspecified: Secondary | ICD-10-CM | POA: Insufficient documentation

## 2024-07-13 DIAGNOSIS — I10 Essential (primary) hypertension: Secondary | ICD-10-CM | POA: Insufficient documentation

## 2024-07-13 DIAGNOSIS — R911 Solitary pulmonary nodule: Secondary | ICD-10-CM

## 2024-07-13 DIAGNOSIS — G4730 Sleep apnea, unspecified: Secondary | ICD-10-CM | POA: Insufficient documentation

## 2024-07-13 DIAGNOSIS — Z7951 Long term (current) use of inhaled steroids: Secondary | ICD-10-CM | POA: Diagnosis not present

## 2024-07-13 DIAGNOSIS — F32A Depression, unspecified: Secondary | ICD-10-CM | POA: Insufficient documentation

## 2024-07-13 DIAGNOSIS — L4050 Arthropathic psoriasis, unspecified: Secondary | ICD-10-CM | POA: Insufficient documentation

## 2024-07-13 SURGERY — VIDEO BRONCHOSCOPY WITH ENDOBRONCHIAL NAVIGATION
Anesthesia: General | Laterality: Bilateral

## 2024-07-13 MED ORDER — SUGAMMADEX SODIUM 200 MG/2ML IV SOLN
INTRAVENOUS | Status: DC | PRN
  Administered 2024-07-13: 200 mg via INTRAVENOUS

## 2024-07-13 MED ORDER — EPHEDRINE SULFATE-NACL 50-0.9 MG/10ML-% IV SOSY
PREFILLED_SYRINGE | INTRAVENOUS | Status: DC | PRN
  Administered 2024-07-13: 10 mg via INTRAVENOUS

## 2024-07-13 MED ORDER — FENTANYL CITRATE (PF) 100 MCG/2ML IJ SOLN
INTRAMUSCULAR | Status: DC | PRN
  Administered 2024-07-13 (×2): 50 ug via INTRAVENOUS

## 2024-07-13 MED ORDER — CHLORHEXIDINE GLUCONATE 0.12 % MT SOLN
OROMUCOSAL | Status: AC
  Filled 2024-07-13: qty 15

## 2024-07-13 MED ORDER — PROPOFOL 10 MG/ML IV BOLUS
INTRAVENOUS | Status: DC | PRN
  Administered 2024-07-13: 125 ug/kg/min via INTRAVENOUS
  Administered 2024-07-13: 100 mg via INTRAVENOUS

## 2024-07-13 MED ORDER — ONDANSETRON HCL 4 MG/2ML IJ SOLN
INTRAMUSCULAR | Status: DC | PRN
  Administered 2024-07-13: 4 mg via INTRAVENOUS

## 2024-07-13 MED ORDER — FENTANYL CITRATE (PF) 100 MCG/2ML IJ SOLN: 25.0000 ug | INTRAMUSCULAR | Status: DC | PRN

## 2024-07-13 MED ORDER — ROCURONIUM BROMIDE 10 MG/ML (PF) SYRINGE
PREFILLED_SYRINGE | INTRAVENOUS | Status: AC
  Filled 2024-07-13: qty 10

## 2024-07-13 MED ORDER — DEXAMETHASONE SOD PHOSPHATE PF 10 MG/ML IJ SOLN
INTRAMUSCULAR | Status: DC | PRN
  Administered 2024-07-13: 10 mg via INTRAVENOUS

## 2024-07-13 MED ORDER — LIDOCAINE HCL (PF) 2 % IJ SOLN
INTRAMUSCULAR | Status: AC
  Filled 2024-07-13: qty 5

## 2024-07-13 MED ORDER — OXYCODONE HCL 5 MG/5ML PO SOLN: 5.0000 mg | Freq: Once | ORAL | Status: DC | PRN

## 2024-07-13 MED ORDER — FENTANYL CITRATE (PF) 100 MCG/2ML IJ SOLN
INTRAMUSCULAR | Status: AC
  Filled 2024-07-13: qty 2

## 2024-07-13 MED ORDER — PROPOFOL 1000 MG/100ML IV EMUL
INTRAVENOUS | Status: AC
  Filled 2024-07-13: qty 100

## 2024-07-13 MED ORDER — MIDAZOLAM HCL (PF) 2 MG/2ML IJ SOLN
INTRAMUSCULAR | Status: DC | PRN
  Administered 2024-07-13: 2 mg via INTRAVENOUS

## 2024-07-13 MED ORDER — ROCURONIUM BROMIDE 100 MG/10ML IV SOLN
INTRAVENOUS | Status: DC | PRN
  Administered 2024-07-13 (×2): 50 mg via INTRAVENOUS

## 2024-07-13 MED ORDER — MIDAZOLAM HCL 2 MG/2ML IJ SOLN
INTRAMUSCULAR | Status: AC
  Filled 2024-07-13: qty 2

## 2024-07-13 MED ORDER — OXYCODONE HCL 5 MG PO TABS: 5.0000 mg | ORAL_TABLET | Freq: Once | ORAL | Status: DC | PRN

## 2024-07-13 MED ORDER — PHENYLEPHRINE 80 MCG/ML (10ML) SYRINGE FOR IV PUSH (FOR BLOOD PRESSURE SUPPORT)
PREFILLED_SYRINGE | INTRAVENOUS | Status: DC | PRN
  Administered 2024-07-13 (×2): 160 ug via INTRAVENOUS

## 2024-07-13 MED ORDER — PREDNISONE 20 MG PO TABS: ORAL_TABLET | ORAL | Status: DC

## 2024-07-13 MED ORDER — ONDANSETRON HCL 4 MG/2ML IJ SOLN
INTRAMUSCULAR | Status: AC
  Filled 2024-07-13: qty 2

## 2024-07-13 MED ORDER — LIDOCAINE HCL (CARDIAC) PF 100 MG/5ML IV SOSY
PREFILLED_SYRINGE | INTRAVENOUS | Status: DC | PRN
  Administered 2024-07-13: 100 mg via INTRAVENOUS

## 2024-07-13 NOTE — Transfer of Care (Signed)
 Immediate Anesthesia Transfer of Care Note  Patient: Benjamin Fields  Procedure(s) Performed: VIDEO BRONCHOSCOPY WITH ENDOBRONCHIAL NAVIGATION (Bilateral)  Patient Location: PACU  Anesthesia Type:General  Level of Consciousness: awake  Airway & Oxygen Therapy: Patient Spontanous Breathing  Post-op Assessment: Report given to RN and Post -op Vital signs reviewed and stable  Post vital signs: Reviewed and stable  Last Vitals:  Vitals Value Taken Time  BP 117/94 07/13/24 14:36  Temp 36.4 C 07/13/24 14:36  Pulse 83 07/13/24 14:37  Resp 18 07/13/24 14:37  SpO2 96 % 07/13/24 14:37  Vitals shown include unfiled device data.  Last Pain:  Vitals:   07/13/24 1247  TempSrc: Temporal  PainSc: 0-No pain         Complications: There were no known notable events for this encounter.

## 2024-07-13 NOTE — Anesthesia Preprocedure Evaluation (Addendum)
 Anesthesia Evaluation  Patient identified by MRN, date of birth, ID band Patient awake    Reviewed: Allergy  & Precautions, NPO status , Patient's Chart, lab work & pertinent test results  History of Anesthesia Complications Negative for: history of anesthetic complications  Airway Mallampati: III  TM Distance: >3 FB Neck ROM: full    Dental no notable dental hx. (+) Missing, Partial Upper   Pulmonary sleep apnea    Pulmonary exam normal        Cardiovascular hypertension, On Medications + CAD  Normal cardiovascular exam     Neuro/Psych  PSYCHIATRIC DISORDERS Anxiety Depression     Neuromuscular disease    GI/Hepatic negative GI ROS, Neg liver ROS,,,  Endo/Other  negative endocrine ROS    Renal/GU CRFRenal disease     Musculoskeletal  (+) Arthritis , Rheumatoid disorders,    Abdominal   Peds  Hematology negative hematology ROS (+)   Anesthesia Other Findings Past Medical History: No date: Allergy      Comment:  mild- no meds  03/09/18: Anxiety 06/28/2024: Aortic atherosclerosis 07/30/2022: Cervical disc herniation 07/07/2024: CKD stage 3a, GFR 45-59 ml/min (HCC) 04/02/2024: Coronary artery disease involving native coronary artery  of native heart without angina pectoris 03/16/2018: DDD (degenerative disc disease), cervical 03/09/2018: Depression 05/04/2024: Dyslipidemia 01/17/2023: Effusion of right knee 01/08/16: Hypertension 12/04/2021: Lateral epicondylitis of right elbow 04/13/2024: Lentigo 03/02/2024: Lobar pneumonia 05/04/2024: Lumbar radiculopathy 07/08/2024: Lung nodule 05/30/2018: Neuromuscular disorder (HCC)     Comment:  mild nerve issue right leg  10/29/2023: Neuropathy 04/02/2024: Paresthesia 10/27/2023: Pruritus No date: RA (rheumatoid arthritis) (HCC) 04/02/2024: Right foot drop 07/30/2022: Spinal stenosis of cervical region 05/19/2018: Tinea versicolor  Past Surgical History: 2010:  CERVICAL SPINE SURGERY No date: COLONOSCOPY 04/2020: COLONOSCOPY 05/05/2024: IR INJECT/THERA/INC NEEDLE/CATH/PLC EPI/LUMB/SAC W/IMG No date: ROTATOR CUFF REPAIR; Right 12/2004: SPINE SURGERY No date: TONSILLECTOMY     Reproductive/Obstetrics negative OB ROS                              Anesthesia Physical Anesthesia Plan  ASA: 3  Anesthesia Plan: General ETT   Post-op Pain Management: Ofirmev  IV (intra-op)* and Toradol  IV (intra-op)*   Induction: Intravenous  PONV Risk Score and Plan: Ondansetron , Dexamethasone , Midazolam, Treatment may vary due to age or medical condition, Propofol infusion and TIVA  Airway Management Planned: Oral ETT  Additional Equipment:   Intra-op Plan:   Post-operative Plan: Extubation in OR  Informed Consent: I have reviewed the patients History and Physical, chart, labs and discussed the procedure including the risks, benefits and alternatives for the proposed anesthesia with the patient or authorized representative who has indicated his/her understanding and acceptance.     Dental Advisory Given  Plan Discussed with: Anesthesiologist, CRNA and Surgeon  Anesthesia Plan Comments: (Patient consented for risks of anesthesia including but not limited to:  - adverse reactions to medications - damage to eyes, teeth, lips or other oral mucosa - nerve damage due to positioning  - sore throat or hoarseness - Damage to heart, brain, nerves, lungs, other parts of body or loss of life  Patient voiced understanding and assent.)         Anesthesia Quick Evaluation

## 2024-07-13 NOTE — Procedures (Signed)
 Procedure Note  Patient: Benjamin Fields  GE 3D [facility] mobile C-arm was utilized to identify and biopsy RML mass.  Needle-in-lesion was confirmed using real-time GE 3D OEC imaging, and images were uploaded to PACS.     Belva November, MD [facility] 07/13/2024 2:33 PM

## 2024-07-13 NOTE — Interval H&P Note (Signed)
 Patient presenting for robotic assisted navigational bronchoscopy for biopsy of multiple pulmonary lesions in the setting of known autoimmune disease (suspected psoriatic arthritis based on rheumatology notes). He was previously immune suppressed but this has been held since 2023. Differential diagnosis includes organizing pneumonia, sarcoidosis, other granulomatous lung disease, infection, and less likely malignancy. Plan for robotic assisted navigation to nodule for biopsy. Risks and benefits explained to the patient and his wife and they agree to proceed. He is appropriate for the procedure.  Belva November, MD [facility] 07/13/2024 1:06 PM

## 2024-07-13 NOTE — Op Note (Signed)
 Video Bronchoscopy with Robotic Assisted Bronchoscopic Navigation   Date of Operation: 07/13/2024   Pre-op Diagnosis: lung nodules  Surgeon: Belva November, MD  Anesthesia: General endotracheal anesthesia  Operation: Flexible video fiberoptic bronchoscopy with robotic assistance and biopsies.  Estimated Blood Loss: Minimal  Complications: None  Indications and History: Benjamin Fields is a 63 y.o. male with history of psoriatic arthritis and waxing/waning nodules.  Recommendation made to achieve a tissue diagnosis via robotic assisted navigational bronchoscopy.  The risks, benefits, complications, treatment options and expected outcomes were discussed with the patient.  The possibilities of pneumothorax, pneumonia, reaction to medication, pulmonary aspiration, perforation of a viscus, bleeding, failure to diagnose a condition and creating a complication requiring transfusion or operation were discussed with the patient who freely signed the consent.    Description of Procedure: The patient was seen in the Preoperative Area, was examined and was deemed appropriate to proceed.  The patient was taken to [facility] procedure room 2, identified as Benjamin Fields and the procedure verified as Flexible Video Fiberoptic Bronchoscopy.  A Time Out was held and the above information confirmed.   Prior to the date of the procedure a high-resolution CT scan of the chest was performed. Utilizing ION software program a virtual tracheobronchial tree was generated to allow the creation of distinct navigation pathways to the patient's parenchymal abnormalities. After being taken to the operating room general anesthesia was initiated and the patient  was orally intubated. The video fiberoptic bronchoscope was introduced via the endotracheal tube and a general inspection was performed which showed normal right and left lung anatomy. Aspiration of the bilateral mainstems was completed to remove any remaining  secretions. Robotic catheter inserted into patient's endotracheal tube.   Target #1 RML nodule: The distinct navigation pathways prepared prior to this procedure were then utilized to navigate to patient's lesion identified on CT scan. The robotic catheter was secured into place and the vision probe was withdrawn.  Lesion location was approximated using fluoroscopy.  Local registration and targeting was performed using GE 3D OEC mobile C-arm three-dimensional imaging. Under fluoroscopic guidance transbronchial needle brushings, transbronchial needle biopsies, and transbronchial forceps biopsies were performed to be sent for cytology and pathology.  Needle-in-lesion was confirmed using GE 3D OEC mobile C-arm.   Tool in lesion:     Target #2 EBUS:  The EBUS bronchoscope was introduced into the airway and the hilar and mediastinal lymph node stations were examined. Station 7 was noted to be enlarged and was sampled with a 21 G Olympus ViziShot 2 needle. Samples were sent for cytology.    At the end of the procedure a general airway inspection was performed and there was no evidence of active bleeding. A BAL was performed in the anterior segment of the RUL (RB3). 60 mL introduced, 40 mL withdrawn. Samples sent for culture and cell count. The bronchoscope was removed.  The patient tolerated the procedure well. There was no significant blood loss and there were no obvious complications. A post-procedural chest x-ray is pending.  Samples Target #1: RML mass 1. Transbronchial needle brushings from RML mass 2. Transbronchial Wang needle biopsies from RML mass 3. Transbronchial forceps biopsies from RML mass  Samples Target #2: EBUS to station 7  Samples Target #3: BAL in RUL (RB3)   Plans:  The patient will be discharged from the PACU to home when recovered from anesthesia and after chest x-ray is reviewed. We will review the cytology, pathology and microbiology results with the patient  when they  become available. Outpatient followup will be with Dr. Isaiah.  Belva November, MD [facility] 07/13/2024 2:28 PM

## 2024-07-13 NOTE — Discharge Instructions (Signed)
 We will start you on a prednisone  taper that you will take over the next few weeks (40 mg for 2 weeks then 30 mg for 2 weeks then 20 mg for 2 weeks then 10 mg for 2 weeks then stop).  Please schedule a follow up with your pulmonologist, Dr. Isaiah, for follow up.  Please schedule a visit with your rheumatologist as your underlying psoriatic arthritis will need treatment.

## 2024-07-13 NOTE — Anesthesia Procedure Notes (Addendum)
 Procedure Name: Intubation Date/Time: 07/13/2024 1:29 PM  Performed by: Bonnetta Jimmey SAUNDERS, CRNAPre-anesthesia Checklist: Patient identified, Emergency Drugs available, Suction available and Patient being monitored Patient Re-evaluated:Patient Re-evaluated prior to induction Oxygen Delivery Method: Circle system utilized Preoxygenation: Pre-oxygenation with 100% oxygen Induction Type: IV induction Ventilation: Mask ventilation without difficulty Laryngoscope Size: McGrath and 4 Grade View: Grade I Tube type: Oral Tube size: 8.5 mm Number of attempts: 1 Airway Equipment and Method: Stylet and Oral airway Placement Confirmation: ETT inserted through vocal cords under direct vision, positive ETCO2 and breath sounds checked- equal and bilateral Secured at: 23 cm Tube secured with: Tape Dental Injury: Teeth and Oropharynx as per pre-operative assessment

## 2024-07-13 NOTE — Anesthesia Postprocedure Evaluation (Signed)
 Anesthesia Post Note  Patient: Benjamin Fields  Procedure(s) Performed: VIDEO BRONCHOSCOPY WITH ENDOBRONCHIAL NAVIGATION (Bilateral)  Patient location during evaluation: PACU Anesthesia Type: General Level of consciousness: awake and alert Pain management: pain level controlled Vital Signs Assessment: post-procedure vital signs reviewed and stable Respiratory status: spontaneous breathing, nonlabored ventilation, respiratory function stable and patient connected to nasal cannula oxygen Cardiovascular status: blood pressure returned to baseline and stable Postop Assessment: no apparent nausea or vomiting Anesthetic complications: no   There were no known notable events for this encounter.   Last Vitals:  Vitals:   07/13/24 1247 07/13/24 1436  BP: 134/81 (!) 117/94  Pulse: 91 89  Resp: 16 17  Temp: (!) 36.2 C (!) 36.4 C  SpO2: 98% 95%    Last Pain:  Vitals:   07/13/24 1247  TempSrc: Temporal  PainSc: 0-No pain                 Shau-Shau Melia

## 2024-07-14 ENCOUNTER — Inpatient Hospital Stay: Admitting: Nurse Practitioner

## 2024-07-14 ENCOUNTER — Telehealth: Payer: Self-pay

## 2024-07-14 ENCOUNTER — Encounter: Payer: Self-pay | Admitting: Student in an Organized Health Care Education/Training Program

## 2024-07-14 ENCOUNTER — Ambulatory Visit: Admitting: Internal Medicine

## 2024-07-14 LAB — BODY FLUID CELL COUNT WITH DIFFERENTIAL
Eos, Fluid: 7 %
Lymphs, Fluid: 21 %
Monocyte-Macrophage-Serous Fluid: 17 % — ABNORMAL LOW (ref 50–90)
Neutrophil Count, Fluid: 55 % — ABNORMAL HIGH (ref 0–25)
Total Nucleated Cell Count, Fluid: 5 uL (ref 0–1000)

## 2024-07-14 MED ORDER — PREDNISONE 20 MG PO TABS: ORAL_TABLET | ORAL | 0 refills | Status: AC

## 2024-07-14 NOTE — Progress Notes (Deleted)
   Established Patient Office Visit  Subjective   Patient ID: Benjamin Fields, male    DOB: [birth year]  Age: 63 y.o. MRN: [id number]  No chief complaint on file.   HPI  Patient was sent to the emergency department on 07/06/2024 by pulmonologist after cardiology reviewed a CT scan of the chest that showed possible multifocal pneumonia.  Chest x-ray showed multifocal airspace opacity bilaterally with nodular appearance.  He was admitted.  Patient was discharged on 07/08/2024.  Patient was evaluated pulmonology felt to be most likely autoimmune in nature.  They did want him to continue oral antibiotics and undergo bronchoscopy which he has done.  Patient underwent bronchoscopy on 07/13/2024  {History (Optional):23778}  ROS    Objective:     There were no vitals taken for this visit. {Vitals History (Optional):23777}  Physical Exam   No results found for any visits on 07/14/24.  {Labs (Optional):23779}  The ASCVD Risk score (Arnett DK, et al., 2019) failed to calculate for the following reasons:   The valid total cholesterol range is 130 to 320 mg/dL    Assessment & Plan:   Problem List Items Addressed This Visit   None   No follow-ups on file.    Adina Crandall, NP

## 2024-07-14 NOTE — Telephone Encounter (Signed)
 I have sent in the script and notified the patient's wife (DPR).  Nothing further needed.

## 2024-07-14 NOTE — Telephone Encounter (Signed)
 Copied from CRM [id number]. Topic: Clinical - Prescription Issue >> Jul 14, 2024  9:51 AM Rilla NOVAK wrote: Reason for CRM: Patient's wife Stephania calling.  He had a bronch on yesterday with dr isadora. Patient was suppose to be prescribed prednisone . Please clarify. Patient prefers [facility] on [address]  Please call patient's wife @ [phone].   ----------------------------------------------------------------------- From previous Reason for Contact - Other: Reason for CRM:

## 2024-07-15 ENCOUNTER — Ambulatory Visit: Payer: Self-pay | Admitting: Student in an Organized Health Care Education/Training Program

## 2024-07-15 ENCOUNTER — Ambulatory Visit: Admitting: Neurosurgery

## 2024-07-15 LAB — PNEUMOCYSTIS JIROVECI SMEAR BY DFA

## 2024-07-15 LAB — CYTOLOGY - NON PAP

## 2024-07-15 LAB — ACID FAST SMEAR (AFB, MYCOBACTERIA): Acid Fast Smear: NEGATIVE

## 2024-07-15 LAB — SURGICAL PATHOLOGY

## 2024-07-16 LAB — CULTURE, RESPIRATORY W GRAM STAIN
Culture: NORMAL
Gram Stain: NONE SEEN

## 2024-07-16 LAB — ACID FAST SMEAR (AFB, MYCOBACTERIA): Acid Fast Smear: NEGATIVE

## 2024-07-18 LAB — AEROBIC/ANAEROBIC CULTURE W GRAM STAIN (SURGICAL/DEEP WOUND)
Culture: NO GROWTH
Gram Stain: NONE SEEN

## 2024-07-26 ENCOUNTER — Ambulatory Visit: Admitting: Nurse Practitioner

## 2024-07-26 VITALS — BP 112/60 | HR 80 | Temp 97.9°F | Ht 70.0 in | Wt 189.0 lb

## 2024-07-26 DIAGNOSIS — Z09 Encounter for follow-up examination after completed treatment for conditions other than malignant neoplasm: Secondary | ICD-10-CM | POA: Diagnosis not present

## 2024-07-26 DIAGNOSIS — R7989 Other specified abnormal findings of blood chemistry: Secondary | ICD-10-CM

## 2024-07-26 DIAGNOSIS — M0609 Rheumatoid arthritis without rheumatoid factor, multiple sites: Secondary | ICD-10-CM

## 2024-07-26 LAB — BASIC METABOLIC PANEL WITH GFR
BUN: 25 mg/dL — ABNORMAL HIGH (ref 6–23)
CO2: 31 meq/L (ref 19–32)
Calcium: 9.1 mg/dL (ref 8.4–10.5)
Chloride: 105 meq/L (ref 96–112)
Creatinine, Ser: 1.51 mg/dL — ABNORMAL HIGH (ref 0.40–1.50)
GFR: 48.16 mL/min — ABNORMAL LOW (ref >60.00)
Glucose, Bld: 151 mg/dL — ABNORMAL HIGH (ref 70–99)
Potassium: 4.5 meq/L (ref 3.5–5.1)
Sodium: 141 meq/L (ref 135–145)

## 2024-07-26 LAB — CBC
HCT: 43.7 % (ref 39.0–52.0)
Hemoglobin: 14.5 g/dL (ref 13.0–17.0)
MCHC: 33.1 g/dL (ref 30.0–36.0)
MCV: 92.3 fl (ref 78.0–100.0)
Platelets: 220 K/uL (ref 150.0–400.0)
RBC: 4.73 Mil/uL (ref 4.22–5.81)
RDW: 16.1 % — ABNORMAL HIGH (ref 11.5–15.5)
WBC: 11.5 K/uL — ABNORMAL HIGH (ref 4.0–10.5)

## 2024-07-26 NOTE — Patient Instructions (Signed)
 Nice to see you today  Follow up with pulmonology as scheduled I have referred you to rheumatology  Follow up with me as scheduled, sooner If you need me

## 2024-07-26 NOTE — Progress Notes (Signed)
 Established Patient Office Visit  Subjective   Patient ID: Benjamin Fields, male    DOB: [birth year]  Age: 63 y.o. MRN: [id number]  Chief Complaint  Patient presents with   Hospitalization Follow-up    Pt complains of doing a little better.    Medication Refill    Cymbalta .     HPI  Patient was sent to the emergency department on 07/06/2024 after cardiologist saw multifocal pneumonia on CT scan of chest.  He was reach out to his pulmonologist pulmonologist recommended hospitalization.  Patient was discharged on 07/08/2024.  He did have an outpatient bronchoscopy.  Results showed only acute inflammation and no malignancy.  Patient has an appointment with Dr. Isaiah on 09/14/2024.  Patient did undergo a CT scan of chest on 07/12/2024 that showed persistent multifocal bilateral airspace and ground glass opacities increase in size.  Discussed the use of AI scribe software for clinical note transcription with the patient, who gave verbal consent to proceed.  History of Present Illness Benjamin Fields is a 63 year old male with rheumatoid arthritis who presents for follow-up after hospitalization for pneumonia and evaluation of sleep apnea.  He was recently hospitalized for three days due to persistent pneumonia, identified on a CT scan. During the hospital stay, he underwent numerous tests, including a bronchoscopy and biopsies, which ruled out malignancy. His sedimentation rate and C-reactive protein levels were significantly elevated. He was discharged and started on prednisone  for six weeks. He is currently two weeks into this treatment.  He underwent a sleep study approximately four to five weeks ago, which revealed severe sleep apnea with 387 apneic episodes, averaging 64.1 per hour. He experiences significant daytime fatigue, which his wife has also noticed. He is awaiting the initiation of CPAP therapy, as the equipment is ready for delivery.  He has a history of rheumatoid  arthritis and has been on various medications in the past, including Rinvoq , Enbrel , Humira , and methotrexate . He was initially diagnosed with rheumatoid arthritis, but later at [facility], he was diagnosed with psoriatic arthritis.  He recently had lab work to monitor the effects of Lipitor on his liver, which showed improved cholesterol levels and normal liver function. However, during his hospitalization, his hemoglobin levels dropped, and his white blood cell count was elevated, likely due to infection.  No current shortness of breath. He is awaiting a follow-up appointment with Dr. Isaiah in January.     Review of Systems  Constitutional:  Negative for chills and fever.  Respiratory:  Negative for shortness of breath.   Cardiovascular:  Negative for chest pain.      Objective:     BP 112/60   Pulse 80   Temp 97.9 F (36.6 C) (Oral)   Ht 5' 10 (1.778 m)   Wt 189 lb (85.7 kg)   SpO2 95%   BMI 27.12 kg/m    Physical Exam Vitals and nursing note reviewed.  Constitutional:      Appearance: Normal appearance.  Cardiovascular:     Rate and Rhythm: Normal rate and regular rhythm.     Heart sounds: Normal heart sounds.  Pulmonary:     Effort: Pulmonary effort is normal.     Breath sounds: Normal breath sounds.  Neurological:     Mental Status: He is alert.      No results found for any visits on 07/26/24.    The ASCVD Risk score (Arnett DK, et al., 2019) failed to calculate for the following reasons:  The valid total cholesterol range is 130 to 320 mg/dL    Assessment & Plan:   Problem List Items Addressed This Visit       Musculoskeletal and Integument   Rheumatoid arthritis of multiple sites with negative rheumatoid factor (HCC)   Relevant Orders   Ambulatory referral to Rheumatology   Other Visit Diagnoses       Hospital discharge follow-up    -  Primary   Relevant Orders   CBC   Basic metabolic panel with GFR     Abnormal CBC       Relevant Orders    CBC     Assessment and Plan Assessment & Plan Rheumatoid arthritis with pulmonary involvement Managed with prednisone . Elevated sedimentation rate and C-reactive protein. No malignancy in recent biopsies. - Continue prednisone  taper as prescribed. - Referred to rheumatology at [facility] for further management. - Did review ED note, discharge note, recent labs and imaging  Obstructive sleep apnea Severe obstructive sleep apnea with 387 apneic episodes and an apnea-hypopnea index of 64.1 per hour. CPAP therapy recommended. - Monitor effectiveness of CPAP therapy over the next few months.  General Health Maintenance Discussion about flu vaccination due to recent pulmonary issues. He prefers to defer vaccination. - Deferred flu vaccination    Return if symptoms worsen or fail to improve, for As scheduled .    Adina Crandall, NP

## 2024-07-27 ENCOUNTER — Ambulatory Visit: Payer: Self-pay | Admitting: Nurse Practitioner

## 2024-07-29 ENCOUNTER — Encounter: Payer: Self-pay | Admitting: Internal Medicine

## 2024-07-29 ENCOUNTER — Ambulatory Visit: Admitting: Internal Medicine

## 2024-07-29 VITALS — BP 100/70 | HR 77 | Temp 98.4°F | Ht 70.0 in | Wt 189.6 lb

## 2024-07-29 DIAGNOSIS — R0602 Shortness of breath: Secondary | ICD-10-CM

## 2024-07-29 DIAGNOSIS — G4733 Obstructive sleep apnea (adult) (pediatric): Secondary | ICD-10-CM

## 2024-07-29 DIAGNOSIS — R9389 Abnormal findings on diagnostic imaging of other specified body structures: Secondary | ICD-10-CM

## 2024-07-29 NOTE — Progress Notes (Signed)
 [facility] Pulmonary Medicine Consultation      Date: 07/29/2024,   MRN# [id number] Benjamin Fields 1960/11/04   CHIEF COMPLAINT:   Follow up assessment of pneumonia and abnormal CT chest Follow-up assessment for sleep apnea  HISTORY OF PRESENT ILLNESS   Non toxic appearing, well nourished Patient is a non-smoker Patient has a history of rheumatoid arthritis had been on multiple medications in the past patient diagnosed with psoriatic arthritis however is not on any type of immunosuppressive therapy at this time  Patient was given Levaquin  and prednisone  several months ago Patient has diagnosed with OSA with AHI of 2 years ago Severe AHi 30   Patient with ongoing fevers and ground glass opacifications abnormal CT scan-patient was admitted for further evaluation Patient was admitted was given IV antibiotics and steroids and underwent navigation bronchoscopy INTERPRETATION(S):      - NEGATIVE FOR MALIGNANCY.       - MIXED ACUTE AND CHRONIC INFLAMMATION WITH REACTIVE BRONCHIAL CELLS.       - NEGATIVE FOR MALIGNANCY.       - REACTIVE BRONCHIAL CELLS.       - NEGATIVE FOR MALIGNANCY.       - LYMPHOID TISSUE CONSISTENT WITH SAMPLING OF LYMPH NODE.   Biopsy results showed ongoing inflammation however no specifics diagnosis AFB smear negative Fungal cultures-Fungal elements, such as arthroconidia, hyphal fragments,  chlamydoconidia, observed   Patient was discharged on prolonged prednisone  therapy Currently on 30 mg prednisone  We will continue as prescribed and taper accordingly Patient is to have a follow-up rheumatology evaluation and assessment Patient with a history of rheumatoid arthritis and psoriatic arthritis had been on immunosuppressive therapy for many years patient subsequently weaned himself off therapy in 2023  HST several months ago showed AHI of 65 Recommend starting CPAP therapy   PAST MEDICAL HISTORY   Past Medical History:  Diagnosis Date   Allergy      mild- no meds    Anxiety 03/09/18   Aortic atherosclerosis 06/28/2024   Cervical disc herniation 07/30/2022   CKD stage 3a, GFR 45-59 ml/min (HCC) 07/07/2024   Coronary artery disease involving native coronary artery of native heart without angina pectoris 04/02/2024   DDD (degenerative disc disease), cervical 03/16/2018   Depression 03/09/2018   Dyslipidemia 05/04/2024   Effusion of right knee 01/17/2023   Hypertension 01/08/16   Lateral epicondylitis of right elbow 12/04/2021   Lentigo 04/13/2024   Lobar pneumonia 03/02/2024   Lumbar radiculopathy 05/04/2024   Lung nodule 07/08/2024   Neuromuscular disorder (HCC) 05/30/2018   mild nerve issue right leg    Neuropathy 10/29/2023   Paresthesia 04/02/2024   Pruritus 10/27/2023   RA (rheumatoid arthritis) (HCC)    Right foot drop 04/02/2024   Spinal stenosis of cervical region 07/30/2022   Tinea versicolor 05/19/2018     SURGICAL HISTORY   Past Surgical History:  Procedure Laterality Date   CERVICAL SPINE SURGERY  2010   COLONOSCOPY     COLONOSCOPY  04/2020   IR INJECT/THERA/INC NEEDLE/CATH/PLC EPI/LUMB/SAC W/IMG  05/05/2024   ROTATOR CUFF REPAIR Right    SPINE SURGERY  12/2004   TONSILLECTOMY     VIDEO BRONCHOSCOPY WITH ENDOBRONCHIAL NAVIGATION Bilateral 07/13/2024   Procedure: VIDEO BRONCHOSCOPY WITH ENDOBRONCHIAL NAVIGATION;  Surgeon: Isadora Hose, MD;  Location: [facility] ORS;  Service: Pulmonary;  Laterality: Bilateral;     FAMILY HISTORY   Family History  Problem Relation Age of Onset   Parkinson's disease Mother    Atrial fibrillation Mother  Arthritis Mother    Depression Mother    Heart failure Father    Hypertension Father    Prostate cancer Father 97   COPD Father    Heart disease Father    Colon cancer Neg Hx    Colon polyps Neg Hx    Esophageal cancer Neg Hx    Rectal cancer Neg Hx    Stomach cancer Neg Hx      SOCIAL HISTORY   Social History   Tobacco Use   Smoking status: Never   Smokeless  tobacco: Never  Vaping Use   Vaping status: Never Used  Substance Use Topics   Alcohol use: Yes    Comment: rare wine during the holiday   Drug use: Never     MEDICATIONS    Home Medication:  Current Outpatient Rx   Order #: [id number] Class: Normal   Order #: [id number] Class: Normal   Order #: [id number] Class: Historical Med   Order #: [id number] Class: Normal   Order #: [id number] Class: No Print   Order #: [id number] Class: Historical Med   Order #: [id number] Class: Normal    Current Medication:  Current Outpatient Medications:    albuterol  (VENTOLIN  HFA) 108 (90 Base) MCG/ACT inhaler, Inhale 2 puffs into the lungs every 6 (six) hours as needed for wheezing or shortness of breath., Disp: 8 g, Rfl: 2   atorvastatin  (LIPITOR) 10 MG tablet, Take 1 tablet (10 mg total) by mouth daily., Disp: 90 tablet, Rfl: 1   DULoxetine  (CYMBALTA ) 30 MG capsule, Take 30 mg by mouth daily., Disp: , Rfl:    gabapentin  (NEURONTIN ) 100 MG capsule, TAKE 1 CAPSULE BY MOUTH IN THE MORNING AND 4 EVERY DAY AT BEDTIME, Disp: 450 capsule, Rfl: 0   losartan  (COZAAR ) 25 MG tablet, Take 1 tablet (25 mg total) by mouth daily., Disp: , Rfl:    Multiple Vitamin (MULTIVITAMIN) tablet, Take 1 tablet by mouth daily. OTC, Disp: , Rfl:    predniSONE  (DELTASONE ) 20 MG tablet, Take 2 tablets (40 mg total) by mouth daily with breakfast for 14 days, THEN 1.5 tablets (30 mg total) daily with breakfast for 14 days, THEN 1 tablet (20 mg total) daily with breakfast for 14 days, THEN 0.5 tablets (10 mg total) daily with breakfast for 14 days., Disp: 70 tablet, Rfl: 0   BP 100/70   Pulse 77   Temp 98.4 F (36.9 C)   Ht 5' 10 (1.778 m)   Wt 189 lb 9.6 oz (86 kg)   SpO2 98%   BMI 27.20 kg/m      ASSESSMENT/PLAN    63 year old pleasant white male seen today for abnormal CT chest likely related to multifocal pneumonia with heterogeneous opacities left greater than right At this time patient feels like his symptoms have  returned however he is nontoxic-appearing and his repeat Ct chest may have new RT sided opacity, patient was prescribed another round of ABX with AUGMENTIN . Wifealso present for examination-ALL questions answered Patient with ongoing infiltrating ground glass opacifications Patient underwent navigational bronchoscopy one of the nodular opacities-findings are consistent with inflammation patient was prescribed prednisone  therapy and he feels significantly better we will continue prednisone  therapy until he sees rheumatology patient likely will need to restart his immunosuppressive therapy for Psoriatic arthritis  No indication for antibiotics at this time No evidence of infection at this time Continue prednisone  taper until evaluation with rheumatology  Recommend repeating CT scan in 3 months for further  Assessment of OSA Recent HST showed severe sleep apnea AHI of  65 Recommend starting auto CPAP 4-14  MEDICATION ADJUSTMENTS/LABS AND TESTS ORDERED: Recommend starting auto CPAP 4-14 Patient has severe sleep apnea Continue prednisone  Avoid Allergens and Irritants Avoid secondhand smoke Avoid SICK contacts Recommend  Masking  when appropriate Recommend Keep up-to-date with vaccinations Follow-up in rheumatology assessment evaluation  CURRENT MEDICATIONS REVIEWED AT LENGTH WITH PATIENT TODAY   Patient  satisfied with Plan of action and management. All questions answered   Follow up 3 months   I spent a total of 55 minutes dedicated to the care of this patient on the date of this encounter to include pre-visit review of records, face-to-face time with the patient discussing conditions above, post visit ordering of testing, clinical documentation with the electronic health record, making appropriate referrals as documented, and communicating necessary information to the patient's healthcare team.    The Patient requires high complexity decision making for assessment and support,  frequent evaluation and titration of therapies, application of advanced monitoring technologies and extensive interpretation of multiple databases.  Patient satisfied with Plan of action and management. All questions answered    Nickolas Alm Cellar, M.D.  [facility] Pulmonary & Critical Care Medicine  Medical Director [facility]

## 2024-07-29 NOTE — Patient Instructions (Addendum)
 You have severe sleep apnea AHI 65 Recommend starting auto CPAP 4-14 Mask of choice  Continue prednisone  therapy as prescribed will discuss therapy as you get closer to your rheumatology appointment  Avoid Allergens and Irritants Avoid secondhand smoke Avoid SICK contacts Recommend  Masking  when appropriate Recommend Keep up-to-date with vaccinations  Consider follow-up CT scan of your chest in 3 months

## 2024-08-03 LAB — CULTURE, FUNGUS WITHOUT SMEAR

## 2024-08-04 MED ORDER — DULOXETINE HCL 30 MG PO CPEP: 30.0000 mg | ORAL_CAPSULE | Freq: Every day | ORAL | 2 refills | Status: AC

## 2024-08-12 LAB — FUNGUS CULTURE WITH STAIN

## 2024-08-12 LAB — FUNGUS CULTURE RESULT

## 2024-08-12 LAB — FUNGAL ORGANISM REFLEX

## 2024-08-23 ENCOUNTER — Other Ambulatory Visit: Payer: Self-pay | Admitting: Nurse Practitioner

## 2024-08-23 DIAGNOSIS — I10 Essential (primary) hypertension: Secondary | ICD-10-CM

## 2024-08-28 LAB — ACID FAST CULTURE WITH REFLEXED SENSITIVITIES (MYCOBACTERIA)
Acid Fast Culture: NEGATIVE
Acid Fast Culture: NEGATIVE

## 2024-08-30 ENCOUNTER — Other Ambulatory Visit: Payer: Self-pay | Admitting: Nurse Practitioner

## 2024-08-30 ENCOUNTER — Encounter: Payer: Self-pay | Admitting: Nurse Practitioner

## 2024-09-02 MED ORDER — LOSARTAN POTASSIUM 25 MG PO TABS: 25.0000 mg | ORAL_TABLET | Freq: Every day | ORAL | 1 refills | Status: AC

## 2024-09-14 ENCOUNTER — Ambulatory Visit: Admitting: Internal Medicine

## 2024-09-27 ENCOUNTER — Encounter: Payer: Self-pay | Admitting: Neurosurgery

## 2024-09-30 ENCOUNTER — Encounter: Payer: Self-pay | Admitting: Neurosurgery

## 2024-09-30 ENCOUNTER — Ambulatory Visit: Admitting: Neurosurgery

## 2024-09-30 VITALS — BP 134/84 | Ht 70.0 in | Wt 189.0 lb

## 2024-09-30 DIAGNOSIS — M545 Low back pain, unspecified: Secondary | ICD-10-CM

## 2024-09-30 MED ORDER — METHYLPREDNISOLONE 4 MG PO TBPK: ORAL_TABLET | ORAL | 0 refills | Status: AC

## 2024-09-30 NOTE — Progress Notes (Signed)
 "   Referring Physician:  Wendee Lynwood HERO, NP [address] [place],  [place] [address]  Primary Physician:  Wendee Lynwood HERO, NP  History of Present Illness: 09/30/2024 [name] has had pain for approximately 5 days in his lower back.  He has no radicular symptoms.  He was working in his yard to remove snow last Saturday when he began having back pain.  He denies weakness.   05/11/2024 [name] is here today with a chief complaint of leg pain.  He was in the hospital with terrible leg pain last week.  He had an epidural steroid injection which has helped somewhat.  He continues to deny any issues with dexterity, numbness in his hands, or balance issues.     05/04/2024 [name] is here today with a chief complaint of an episode of urinary incontinence on the date of presentation.  It sounds like he had difficulty making it to the bathroom.  He denies perineal numbness.  He denies distal weakness in his legs.  He does report difficulty walking and has to sit down due to his legs feeling weak.  His weakness primarily in his upper legs.  He does have some pain in his right buttock and down the back of his leg as well.   This been worsening for the past couple of weeks.  He presents today due to intractable pain and his episode of urinary incontinence.  Review of Systems:  A 10 point review of systems is negative, except for the pertinent positives and negatives detailed in the HPI.  Past Medical History: Past Medical History:  Diagnosis Date   Allergy     mild- no meds    Anxiety 03/09/18   Aortic atherosclerosis 06/28/2024   Cervical disc herniation 07/30/2022   CKD stage 3a, GFR 45-59 ml/min (HCC) 07/07/2024   Coronary artery disease involving native coronary artery of native heart without angina pectoris 04/02/2024   DDD (degenerative disc disease), cervical 03/16/2018   Depression 03/09/2018   Dyslipidemia 05/04/2024   Effusion of right knee 01/17/2023    Hypertension 01/08/16   Lateral epicondylitis of right elbow 12/04/2021   Lentigo 04/13/2024   Lobar pneumonia 03/02/2024   Lumbar radiculopathy 05/04/2024   Lung nodule 07/08/2024   Neuromuscular disorder (HCC) 05/30/2018   mild nerve issue right leg    Neuropathy 10/29/2023   Paresthesia 04/02/2024   Pruritus 10/27/2023   RA (rheumatoid arthritis) (HCC)    Right foot drop 04/02/2024   Spinal stenosis of cervical region 07/30/2022   Tinea versicolor 05/19/2018    Past Surgical History: Past Surgical History:  Procedure Laterality Date   CERVICAL SPINE SURGERY  2010   COLONOSCOPY     COLONOSCOPY  04/2020   IR INJECT/THERA/INC NEEDLE/CATH/PLC EPI/LUMB/SAC W/IMG  05/05/2024   ROTATOR CUFF REPAIR Right    SPINE SURGERY  12/2004   TONSILLECTOMY     VIDEO BRONCHOSCOPY WITH ENDOBRONCHIAL NAVIGATION Bilateral 07/13/2024   Procedure: VIDEO BRONCHOSCOPY WITH ENDOBRONCHIAL NAVIGATION;  Surgeon: Isadora Hose, MD;  Location: [facility] ORS;  Service: Pulmonary;  Laterality: Bilateral;    Allergies: Allergies as of 09/30/2024   (No Known Allergies)    Medications:  Current Outpatient Medications:    albuterol  (VENTOLIN  HFA) 108 (90 Base) MCG/ACT inhaler, Inhale 2 puffs into the lungs every 6 (six) hours as needed for wheezing or shortness of breath., Disp: 8 g, Rfl: 2   atorvastatin  (LIPITOR) 10 MG tablet, Take 1 tablet (10 mg total) by mouth  daily., Disp: 90 tablet, Rfl: 1   DULoxetine  (CYMBALTA ) 30 MG capsule, Take 1 capsule (30 mg total) by mouth daily., Disp: 90 capsule, Rfl: 2   gabapentin  (NEURONTIN ) 100 MG capsule, TAKE 1 CAPSULE BY MOUTH IN THE MORNING AND 4 AT BEDTIME, Disp: 450 capsule, Rfl: 1   losartan  (COZAAR ) 25 MG tablet, Take 1 tablet (25 mg total) by mouth daily., Disp: 90 tablet, Rfl: 1   Multiple Vitamin (MULTIVITAMIN) tablet, Take 1 tablet by mouth daily. OTC, Disp: , Rfl:   Social History: Social History   Tobacco Use   Smoking status: Never   Smokeless tobacco:  Never  Vaping Use   Vaping status: Never Used  Substance Use Topics   Alcohol use: Yes    Comment: rare wine during the holiday   Drug use: Never    Family Medical History: Family History  Problem Relation Age of Onset   Parkinson's disease Mother    Atrial fibrillation Mother    Arthritis Mother    Depression Mother    Heart failure Father        Had valve replacement   Hypertension Father    Prostate cancer Father 67   COPD Father    Heart disease Father    Anemia Father    Colon cancer Neg Hx    Colon polyps Neg Hx    Esophageal cancer Neg Hx    Rectal cancer Neg Hx    Stomach cancer Neg Hx     Physical Examination: Vitals:   09/30/24 1039  BP: 134/84    General: Patient is in no apparent distress. Attention to examination is appropriate.  Neck:   Supple.  Full range of motion.  Respiratory: Patient is breathing without any difficulty.   NEUROLOGICAL:     Awake, alert, oriented to person, place, and time.  Speech is clear and fluent.   Cranial Nerves: Pupils equal round and reactive to light.  Facial tone is symmetric.  Facial sensation is symmetric. Shoulder shrug is symmetric. Tongue protrusion is midline.  There is no pronator drift.  Strength: Side Biceps Triceps Deltoid Interossei Grip Wrist Ext. Wrist Flex.  R 5 5 5 5 5 5 5   L 5 5 5 5 5 5 5    Side Iliopsoas Quads Hamstring PF DF EHL  R 5 5 5 5 5 5   L 5 5 5 5 5 5    Reflexes are 2+ and symmetric at the biceps, triceps, brachioradialis, patella and achilles.   Hoffman's is absent.   Bilateral upper and lower extremity sensation is intact to light touch.    No evidence of dysmetria noted.  Gait is normal.     Medical Decision Making  Imaging: MRI C spine 05/04/2024 IMPRESSION: 1. Adjacent segment disease at C6-C7 with Moderate spinal stenosis AND spinal cord mass effect. Possible faint myelomalacia at that level. No cord edema.   2. Adjacent segment disease at C3-C4 with moderate spinal  stenosis, mild to moderate cord mass effect greater on the right. No cord signal changes.   3. Underlying chronic C4-C5 and C5-C6 ACDF. Possible pseudoarthrosis at the latter by CT last year.   4. Moderate to severe bilateral C4 and C7, moderate right C6 neural foraminal stenosis associated with the above.   Electronically Signed: By: VEAR Hurst M.D. On: 05/04/2024 12:26   MRI L spine 05/03/2024  IMPRESSION: 1. Right subarticular disc extrusion at L5-S1, impinging upon the descending right S1 nerve root as it courses through the right  lateral recess. Disc material also closely approximates the descending left S1 nerve root, also potentially affected. 2. Small central to left subarticular disc protrusion at L2-3 with resultant mild bilateral lateral recess stenosis. 3. Mild disc bulging with facet hypertrophy at L4-5 with resultant mild bilateral L4 foraminal stenosis.     Electronically Signed   By: Morene Hoard M.D.   On: 05/03/2024 21:13  I have personally reviewed the images and agree with the above interpretation.  Assessment and Plan: Benjamin Fields is a pleasant 64 y.o. male with acute back pain without radiculopathy.  His pain has been ongoing for a few days.  I would like to start him on a steroid taper.  I expect that this will improve his pain.  We will touch base with him next week.  If he still has pain at that time, we will send him for physical therapy.    I spent a total of 10 minutes in this patient's care today. This time was spent reviewing pertinent records including imaging studies, obtaining and confirming history, performing a directed evaluation, formulating and discussing my recommendations, and documenting the visit within the medical record.     Thank you for involving me in the care of this patient.      Canio Winokur K. Clois MD, [facility] Neurosurgery  "

## 2024-10-26 ENCOUNTER — Ambulatory Visit: Admitting: Neurosurgery

## 2024-11-11 ENCOUNTER — Ambulatory Visit: Admitting: Internal Medicine

## 2025-01-24 ENCOUNTER — Encounter: Admitting: Nurse Practitioner
